# Patient Record
Sex: Female | Born: 1945 | Race: White | Hispanic: No | Marital: Married | Smoking: Current every day smoker
Health system: Southern US, Community
[De-identification: ages and names within clinical notes are randomized; demographics above are authoritative.]

## PROBLEM LIST (undated history)

## (undated) DIAGNOSIS — R51 Headache: Secondary | ICD-10-CM

## (undated) DIAGNOSIS — E039 Hypothyroidism, unspecified: Secondary | ICD-10-CM

## (undated) DIAGNOSIS — Q2112 Patent foramen ovale: Secondary | ICD-10-CM

## (undated) DIAGNOSIS — H53459 Other localized visual field defect, unspecified eye: Secondary | ICD-10-CM

## (undated) DIAGNOSIS — F329 Major depressive disorder, single episode, unspecified: Secondary | ICD-10-CM

## (undated) DIAGNOSIS — J302 Other seasonal allergic rhinitis: Secondary | ICD-10-CM

## (undated) DIAGNOSIS — Q211 Atrial septal defect: Secondary | ICD-10-CM

## (undated) DIAGNOSIS — I10 Essential (primary) hypertension: Secondary | ICD-10-CM

## (undated) DIAGNOSIS — I639 Cerebral infarction, unspecified: Secondary | ICD-10-CM

## (undated) DIAGNOSIS — R519 Headache, unspecified: Secondary | ICD-10-CM

## (undated) DIAGNOSIS — R200 Anesthesia of skin: Secondary | ICD-10-CM

## (undated) DIAGNOSIS — F419 Anxiety disorder, unspecified: Secondary | ICD-10-CM

## (undated) DIAGNOSIS — K219 Gastro-esophageal reflux disease without esophagitis: Secondary | ICD-10-CM

## (undated) DIAGNOSIS — D649 Anemia, unspecified: Secondary | ICD-10-CM

## (undated) DIAGNOSIS — E7800 Pure hypercholesterolemia, unspecified: Secondary | ICD-10-CM

## (undated) DIAGNOSIS — F32A Depression, unspecified: Secondary | ICD-10-CM

## (undated) DIAGNOSIS — E119 Type 2 diabetes mellitus without complications: Secondary | ICD-10-CM

## (undated) DIAGNOSIS — I499 Cardiac arrhythmia, unspecified: Secondary | ICD-10-CM

## (undated) HISTORY — PX: FOOT NEUROMA SURGERY: SHX646

## (undated) HISTORY — PX: NASAL SINUS SURGERY: SHX719

## (undated) HISTORY — PX: ABDOMINAL HYSTERECTOMY: SHX81

## (undated) HISTORY — PX: PATENT FORAMEN OVALE CLOSURE: SHX2181

## (undated) HISTORY — PX: CARDIAC CATHETERIZATION: SHX172

---

## 2006-10-09 ENCOUNTER — Ambulatory Visit: Payer: Self-pay | Admitting: Family Medicine

## 2007-09-21 ENCOUNTER — Inpatient Hospital Stay: Payer: Self-pay | Admitting: Internal Medicine

## 2007-09-25 ENCOUNTER — Ambulatory Visit: Payer: Self-pay | Admitting: Internal Medicine

## 2007-11-18 ENCOUNTER — Inpatient Hospital Stay: Payer: Self-pay | Admitting: Internal Medicine

## 2007-11-29 ENCOUNTER — Encounter: Payer: Self-pay | Admitting: Internal Medicine

## 2007-12-08 ENCOUNTER — Encounter: Payer: Self-pay | Admitting: Internal Medicine

## 2007-12-31 ENCOUNTER — Ambulatory Visit: Payer: Self-pay | Admitting: Internal Medicine

## 2008-02-10 ENCOUNTER — Ambulatory Visit: Payer: Self-pay | Admitting: Internal Medicine

## 2008-03-12 ENCOUNTER — Inpatient Hospital Stay: Payer: Self-pay | Admitting: Internal Medicine

## 2008-03-27 ENCOUNTER — Encounter (INDEPENDENT_AMBULATORY_CARE_PROVIDER_SITE_OTHER): Payer: Self-pay | Admitting: Neurology

## 2008-03-27 ENCOUNTER — Ambulatory Visit (HOSPITAL_COMMUNITY): Admission: RE | Admit: 2008-03-27 | Discharge: 2008-03-27 | Payer: Self-pay | Admitting: Cardiology

## 2009-02-01 ENCOUNTER — Ambulatory Visit: Payer: Self-pay | Admitting: Internal Medicine

## 2010-03-29 ENCOUNTER — Ambulatory Visit: Payer: Self-pay | Admitting: Internal Medicine

## 2010-07-12 ENCOUNTER — Ambulatory Visit: Payer: Self-pay | Admitting: Internal Medicine

## 2010-11-24 ENCOUNTER — Emergency Department: Payer: Self-pay | Admitting: Emergency Medicine

## 2010-12-21 ENCOUNTER — Ambulatory Visit: Payer: Self-pay | Admitting: Specialist

## 2010-12-23 LAB — PATHOLOGY REPORT

## 2011-04-21 ENCOUNTER — Ambulatory Visit: Payer: Self-pay | Admitting: Gastroenterology

## 2011-04-25 LAB — PATHOLOGY REPORT

## 2011-05-01 ENCOUNTER — Ambulatory Visit: Payer: Self-pay | Admitting: Internal Medicine

## 2011-05-16 ENCOUNTER — Ambulatory Visit: Payer: Self-pay | Admitting: Gastroenterology

## 2011-05-18 LAB — PATHOLOGY REPORT

## 2012-06-17 ENCOUNTER — Ambulatory Visit: Payer: Self-pay | Admitting: Internal Medicine

## 2013-06-18 ENCOUNTER — Ambulatory Visit: Payer: Self-pay | Admitting: Internal Medicine

## 2014-07-10 ENCOUNTER — Ambulatory Visit: Payer: Self-pay | Admitting: Internal Medicine

## 2014-11-13 DIAGNOSIS — E785 Hyperlipidemia, unspecified: Secondary | ICD-10-CM | POA: Diagnosis not present

## 2014-11-13 DIAGNOSIS — I739 Peripheral vascular disease, unspecified: Secondary | ICD-10-CM | POA: Diagnosis not present

## 2014-11-13 DIAGNOSIS — E079 Disorder of thyroid, unspecified: Secondary | ICD-10-CM | POA: Diagnosis not present

## 2014-11-13 DIAGNOSIS — Z0000 Encounter for general adult medical examination without abnormal findings: Secondary | ICD-10-CM | POA: Diagnosis not present

## 2014-11-13 DIAGNOSIS — I10 Essential (primary) hypertension: Secondary | ICD-10-CM | POA: Diagnosis not present

## 2014-11-19 DIAGNOSIS — E785 Hyperlipidemia, unspecified: Secondary | ICD-10-CM | POA: Diagnosis not present

## 2014-11-19 DIAGNOSIS — I10 Essential (primary) hypertension: Secondary | ICD-10-CM | POA: Diagnosis not present

## 2014-11-19 DIAGNOSIS — E119 Type 2 diabetes mellitus without complications: Secondary | ICD-10-CM | POA: Diagnosis not present

## 2014-11-19 DIAGNOSIS — I639 Cerebral infarction, unspecified: Secondary | ICD-10-CM | POA: Diagnosis not present

## 2014-12-10 DIAGNOSIS — G5761 Lesion of plantar nerve, right lower limb: Secondary | ICD-10-CM | POA: Diagnosis not present

## 2014-12-25 DIAGNOSIS — M21371 Foot drop, right foot: Secondary | ICD-10-CM | POA: Diagnosis not present

## 2015-02-10 DIAGNOSIS — I739 Peripheral vascular disease, unspecified: Secondary | ICD-10-CM | POA: Diagnosis not present

## 2015-02-10 DIAGNOSIS — I639 Cerebral infarction, unspecified: Secondary | ICD-10-CM | POA: Diagnosis not present

## 2015-02-10 DIAGNOSIS — E119 Type 2 diabetes mellitus without complications: Secondary | ICD-10-CM | POA: Diagnosis not present

## 2015-02-10 DIAGNOSIS — E785 Hyperlipidemia, unspecified: Secondary | ICD-10-CM | POA: Diagnosis not present

## 2015-02-10 DIAGNOSIS — I10 Essential (primary) hypertension: Secondary | ICD-10-CM | POA: Diagnosis not present

## 2015-02-17 DIAGNOSIS — E538 Deficiency of other specified B group vitamins: Secondary | ICD-10-CM | POA: Insufficient documentation

## 2015-02-17 DIAGNOSIS — I739 Peripheral vascular disease, unspecified: Secondary | ICD-10-CM | POA: Diagnosis not present

## 2015-02-17 DIAGNOSIS — I10 Essential (primary) hypertension: Secondary | ICD-10-CM | POA: Diagnosis not present

## 2015-02-17 DIAGNOSIS — K219 Gastro-esophageal reflux disease without esophagitis: Secondary | ICD-10-CM | POA: Diagnosis not present

## 2015-02-17 DIAGNOSIS — E784 Other hyperlipidemia: Secondary | ICD-10-CM | POA: Diagnosis not present

## 2015-03-12 DIAGNOSIS — H52223 Regular astigmatism, bilateral: Secondary | ICD-10-CM | POA: Diagnosis not present

## 2015-03-12 DIAGNOSIS — I10 Essential (primary) hypertension: Secondary | ICD-10-CM | POA: Diagnosis not present

## 2015-03-12 DIAGNOSIS — E119 Type 2 diabetes mellitus without complications: Secondary | ICD-10-CM | POA: Diagnosis not present

## 2015-03-12 DIAGNOSIS — H2513 Age-related nuclear cataract, bilateral: Secondary | ICD-10-CM | POA: Diagnosis not present

## 2015-03-12 DIAGNOSIS — H5203 Hypermetropia, bilateral: Secondary | ICD-10-CM | POA: Diagnosis not present

## 2015-05-18 DIAGNOSIS — I10 Essential (primary) hypertension: Secondary | ICD-10-CM | POA: Diagnosis not present

## 2015-05-18 DIAGNOSIS — E538 Deficiency of other specified B group vitamins: Secondary | ICD-10-CM | POA: Diagnosis not present

## 2015-05-18 DIAGNOSIS — E119 Type 2 diabetes mellitus without complications: Secondary | ICD-10-CM | POA: Diagnosis not present

## 2015-05-25 DIAGNOSIS — I10 Essential (primary) hypertension: Secondary | ICD-10-CM | POA: Diagnosis not present

## 2015-05-25 DIAGNOSIS — I739 Peripheral vascular disease, unspecified: Secondary | ICD-10-CM | POA: Diagnosis not present

## 2015-05-25 DIAGNOSIS — E079 Disorder of thyroid, unspecified: Secondary | ICD-10-CM | POA: Diagnosis not present

## 2015-05-25 DIAGNOSIS — E784 Other hyperlipidemia: Secondary | ICD-10-CM | POA: Diagnosis not present

## 2015-06-10 DIAGNOSIS — H2512 Age-related nuclear cataract, left eye: Secondary | ICD-10-CM | POA: Diagnosis not present

## 2015-06-10 DIAGNOSIS — H2511 Age-related nuclear cataract, right eye: Secondary | ICD-10-CM | POA: Diagnosis not present

## 2015-06-10 DIAGNOSIS — H5203 Hypermetropia, bilateral: Secondary | ICD-10-CM | POA: Diagnosis not present

## 2015-06-10 DIAGNOSIS — E119 Type 2 diabetes mellitus without complications: Secondary | ICD-10-CM | POA: Diagnosis not present

## 2015-06-10 DIAGNOSIS — I10 Essential (primary) hypertension: Secondary | ICD-10-CM | POA: Diagnosis not present

## 2015-06-10 DIAGNOSIS — H52223 Regular astigmatism, bilateral: Secondary | ICD-10-CM | POA: Diagnosis not present

## 2015-06-14 ENCOUNTER — Other Ambulatory Visit: Payer: Self-pay | Admitting: Internal Medicine

## 2015-06-14 DIAGNOSIS — Z1231 Encounter for screening mammogram for malignant neoplasm of breast: Secondary | ICD-10-CM

## 2015-07-13 ENCOUNTER — Ambulatory Visit
Admission: RE | Admit: 2015-07-13 | Discharge: 2015-07-13 | Disposition: A | Discharge status: Home / Self Care | Payer: Commercial Managed Care - HMO | Source: Ambulatory Visit | Attending: Internal Medicine | Admitting: Internal Medicine

## 2015-07-13 ENCOUNTER — Other Ambulatory Visit: Payer: Self-pay | Admitting: Internal Medicine

## 2015-07-13 ENCOUNTER — Telehealth: Payer: Self-pay | Admitting: *Deleted

## 2015-07-13 DIAGNOSIS — Z1231 Encounter for screening mammogram for malignant neoplasm of breast: Secondary | ICD-10-CM

## 2015-07-13 NOTE — Telephone Encounter (Signed)
After multiple voicemails was able to speak with patient and she requests that I call back to coordinate CT screening at the end of October.

## 2015-08-17 DIAGNOSIS — H2513 Age-related nuclear cataract, bilateral: Secondary | ICD-10-CM | POA: Diagnosis not present

## 2015-08-26 DIAGNOSIS — H2513 Age-related nuclear cataract, bilateral: Secondary | ICD-10-CM | POA: Diagnosis not present

## 2015-08-27 ENCOUNTER — Encounter: Payer: Self-pay | Admitting: *Deleted

## 2015-08-31 NOTE — Discharge Instructions (Signed)

## 2015-09-01 ENCOUNTER — Ambulatory Visit
Admission: RE | Admit: 2015-09-01 | Discharge: 2015-09-01 | Disposition: A | Discharge status: Home / Self Care | Payer: Commercial Managed Care - HMO | Source: Ambulatory Visit | Attending: Ophthalmology | Admitting: Ophthalmology

## 2015-09-01 ENCOUNTER — Ambulatory Visit: Payer: Commercial Managed Care - HMO | Admitting: Anesthesiology

## 2015-09-01 ENCOUNTER — Encounter: Payer: Self-pay | Admitting: *Deleted

## 2015-09-01 ENCOUNTER — Encounter
Admission: RE | Disposition: A | Discharge status: Home / Self Care | Payer: Self-pay | Source: Ambulatory Visit | Attending: Ophthalmology

## 2015-09-01 DIAGNOSIS — Z9889 Other specified postprocedural states: Secondary | ICD-10-CM | POA: Insufficient documentation

## 2015-09-01 DIAGNOSIS — E079 Disorder of thyroid, unspecified: Secondary | ICD-10-CM | POA: Insufficient documentation

## 2015-09-01 DIAGNOSIS — Z8673 Personal history of transient ischemic attack (TIA), and cerebral infarction without residual deficits: Secondary | ICD-10-CM | POA: Insufficient documentation

## 2015-09-01 DIAGNOSIS — H2513 Age-related nuclear cataract, bilateral: Secondary | ICD-10-CM | POA: Diagnosis not present

## 2015-09-01 DIAGNOSIS — Z880 Allergy status to penicillin: Secondary | ICD-10-CM | POA: Diagnosis not present

## 2015-09-01 DIAGNOSIS — E7800 Pure hypercholesterolemia, unspecified: Secondary | ICD-10-CM | POA: Insufficient documentation

## 2015-09-01 DIAGNOSIS — Z7951 Long term (current) use of inhaled steroids: Secondary | ICD-10-CM | POA: Diagnosis not present

## 2015-09-01 DIAGNOSIS — E119 Type 2 diabetes mellitus without complications: Secondary | ICD-10-CM | POA: Diagnosis not present

## 2015-09-01 DIAGNOSIS — K219 Gastro-esophageal reflux disease without esophagitis: Secondary | ICD-10-CM | POA: Insufficient documentation

## 2015-09-01 DIAGNOSIS — F17200 Nicotine dependence, unspecified, uncomplicated: Secondary | ICD-10-CM | POA: Diagnosis not present

## 2015-09-01 DIAGNOSIS — Z79899 Other long term (current) drug therapy: Secondary | ICD-10-CM | POA: Insufficient documentation

## 2015-09-01 DIAGNOSIS — H2511 Age-related nuclear cataract, right eye: Secondary | ICD-10-CM | POA: Insufficient documentation

## 2015-09-01 DIAGNOSIS — Z90710 Acquired absence of both cervix and uterus: Secondary | ICD-10-CM | POA: Diagnosis not present

## 2015-09-01 DIAGNOSIS — H269 Unspecified cataract: Secondary | ICD-10-CM | POA: Diagnosis present

## 2015-09-01 HISTORY — DX: Anxiety disorder, unspecified: F41.9

## 2015-09-01 HISTORY — DX: Headache: R51

## 2015-09-01 HISTORY — DX: Anemia, unspecified: D64.9

## 2015-09-01 HISTORY — DX: Hypothyroidism, unspecified: E03.9

## 2015-09-01 HISTORY — DX: Major depressive disorder, single episode, unspecified: F32.9

## 2015-09-01 HISTORY — DX: Gastro-esophageal reflux disease without esophagitis: K21.9

## 2015-09-01 HISTORY — DX: Atrial septal defect: Q21.1

## 2015-09-01 HISTORY — DX: Other localized visual field defect, unspecified eye: H53.459

## 2015-09-01 HISTORY — DX: Cerebral infarction, unspecified: I63.9

## 2015-09-01 HISTORY — DX: Headache, unspecified: R51.9

## 2015-09-01 HISTORY — DX: Type 2 diabetes mellitus without complications: E11.9

## 2015-09-01 HISTORY — DX: Pure hypercholesterolemia, unspecified: E78.00

## 2015-09-01 HISTORY — DX: Cardiac arrhythmia, unspecified: I49.9

## 2015-09-01 HISTORY — DX: Other seasonal allergic rhinitis: J30.2

## 2015-09-01 HISTORY — DX: Essential (primary) hypertension: I10

## 2015-09-01 HISTORY — DX: Patent foramen ovale: Q21.12

## 2015-09-01 HISTORY — DX: Depression, unspecified: F32.A

## 2015-09-01 HISTORY — PX: CATARACT EXTRACTION W/PHACO: SHX586

## 2015-09-01 HISTORY — DX: Anesthesia of skin: R20.0

## 2015-09-01 LAB — GLUCOSE, CAPILLARY
GLUCOSE-CAPILLARY: 164 mg/dL — AB (ref 65–99)
Glucose-Capillary: 140 mg/dL — ABNORMAL HIGH (ref 65–99)

## 2015-09-01 SURGERY — PHACOEMULSIFICATION, CATARACT, WITH IOL INSERTION
Anesthesia: Monitor Anesthesia Care | Laterality: Right | Wound class: Clean

## 2015-09-01 MED ORDER — ARMC OPHTHALMIC DILATING GEL
1.0000 "application " | OPHTHALMIC | Status: DC | PRN
  Administered 2015-09-01 (×2): 1 via OPHTHALMIC

## 2015-09-01 MED ORDER — LACTATED RINGERS IV SOLN: INTRAVENOUS | Status: DC

## 2015-09-01 MED ORDER — MIDAZOLAM HCL 2 MG/2ML IJ SOLN
INTRAMUSCULAR | Status: DC | PRN
Start: 2015-09-01 — End: 2015-09-01
  Administered 2015-09-01: 2 mg via INTRAVENOUS

## 2015-09-01 MED ORDER — ACETAMINOPHEN 325 MG PO TABS: 325.0000 mg | ORAL_TABLET | ORAL | Status: DC | PRN

## 2015-09-01 MED ORDER — TETRACAINE HCL 0.5 % OP SOLN
1.0000 [drp] | OPHTHALMIC | Status: DC | PRN
  Administered 2015-09-01: 1 [drp] via OPHTHALMIC

## 2015-09-01 MED ORDER — FENTANYL CITRATE (PF) 100 MCG/2ML IJ SOLN
INTRAMUSCULAR | Status: DC | PRN
  Administered 2015-09-01: 50 ug via INTRAVENOUS

## 2015-09-01 MED ORDER — BRIMONIDINE TARTRATE 0.2 % OP SOLN
OPHTHALMIC | Status: DC | PRN
  Administered 2015-09-01: 1 [drp]

## 2015-09-01 MED ORDER — NA HYALUR & NA CHOND-NA HYALUR 0.4-0.35 ML IO KIT
PACK | INTRAOCULAR | Status: DC | PRN
  Administered 2015-09-01: 1 mL via INTRAOCULAR

## 2015-09-01 MED ORDER — ACETAMINOPHEN 160 MG/5ML PO SOLN: 325.0000 mg | ORAL | Status: DC | PRN

## 2015-09-01 MED ORDER — TIMOLOL MALEATE 0.5 % OP SOLN
OPHTHALMIC | Status: DC | PRN
  Administered 2015-09-01: 1 [drp]

## 2015-09-01 MED ORDER — CEFUROXIME OPHTHALMIC INJECTION 1 MG/0.1 ML
INJECTION | OPHTHALMIC | Status: DC | PRN
  Administered 2015-09-01: 0.1 mL via INTRACAMERAL

## 2015-09-01 MED ORDER — POVIDONE-IODINE 5 % OP SOLN
1.0000 "application " | OPHTHALMIC | Status: DC | PRN
  Administered 2015-09-01: 1 via OPHTHALMIC

## 2015-09-01 MED ORDER — EPINEPHRINE HCL 1 MG/ML IJ SOLN
INTRAMUSCULAR | Status: DC | PRN
  Administered 2015-09-01: 92 mL via OPHTHALMIC
  Administered 2015-09-01: 09:00:00 via OPHTHALMIC

## 2015-09-01 SURGICAL SUPPLY — 26 items
CANNULA ANT/CHMB 27GA (MISCELLANEOUS) ×3 IMPLANT
GLOVE SURG LX 7.5 STRW (GLOVE) ×2
GLOVE SURG LX STRL 7.5 STRW (GLOVE) ×1 IMPLANT
GLOVE SURG TRIUMPH 8.0 PF LTX (GLOVE) ×3 IMPLANT
GOWN STRL REUS W/ TWL LRG LVL3 (GOWN DISPOSABLE) ×2 IMPLANT
GOWN STRL REUS W/TWL LRG LVL3 (GOWN DISPOSABLE) ×4
LENS IOL TECNIS 23.5 (Intraocular Lens) ×3 IMPLANT
LENS IOL TECNIS MONO 1P 23.5 (Intraocular Lens) ×1 IMPLANT
MARKER SKIN SURG W/RULER VIO (MISCELLANEOUS) ×3 IMPLANT
NDL RETROBULBAR .5 NSTRL (NEEDLE) IMPLANT
NEEDLE FILTER BLUNT 18X 1/2SAF (NEEDLE) ×4
NEEDLE FILTER BLUNT 18X1 1/2 (NEEDLE) ×2 IMPLANT
PACK CATARACT BRASINGTON (MISCELLANEOUS) ×3 IMPLANT
PACK EYE AFTER SURG (MISCELLANEOUS) ×3 IMPLANT
PACK OPTHALMIC (MISCELLANEOUS) ×3 IMPLANT
RING MALYGIN 7.0 (MISCELLANEOUS) IMPLANT
SUT ETHILON 10-0 CS-B-6CS-B-6 (SUTURE)
SUT VICRYL  9 0 (SUTURE)
SUT VICRYL 9 0 (SUTURE) IMPLANT
SUTURE EHLN 10-0 CS-B-6CS-B-6 (SUTURE) IMPLANT
SYR 3ML LL SCALE MARK (SYRINGE) ×6 IMPLANT
SYR 5ML LL (SYRINGE) IMPLANT
SYR TB 1ML LUER SLIP (SYRINGE) ×3 IMPLANT
WATER STERILE IRR 250ML POUR (IV SOLUTION) ×3 IMPLANT
WATER STERILE IRR 500ML POUR (IV SOLUTION) IMPLANT
WIPE NON LINTING 3.25X3.25 (MISCELLANEOUS) ×3 IMPLANT

## 2015-09-01 NOTE — Anesthesia Preprocedure Evaluation (Signed)
Anesthesia Evaluation  Patient identified by MRN, date of birth, ID band  Reviewed: Allergy & Precautions, H&P , NPO status , Patient's Chart, lab work & pertinent test results  Airway Mallampati: II  TM Distance: >3 FB Neck ROM: full    Dental no notable dental hx.    Pulmonary Current Smoker,    Pulmonary exam normal        Cardiovascular hypertension,  Rhythm:regular Rate:Normal     Neuro/Psych    GI/Hepatic GERD  ,  Endo/Other  diabetesHypothyroidism   Renal/GU      Musculoskeletal   Abdominal   Peds  Hematology   Anesthesia Other Findings   Reproductive/Obstetrics                             Anesthesia Physical Anesthesia Plan  ASA: II  Anesthesia Plan: MAC   Post-op Pain Management:    Induction:   Airway Management Planned:   Additional Equipment:   Intra-op Plan:   Post-operative Plan:   Informed Consent: I have reviewed the patients History and Physical, chart, labs and discussed the procedure including the risks, benefits and alternatives for the proposed anesthesia with the patient or authorized representative who has indicated his/her understanding and acceptance.     Plan Discussed with: CRNA  Anesthesia Plan Comments:         Anesthesia Quick Evaluation

## 2015-09-01 NOTE — Transfer of Care (Signed)
Immediate Anesthesia Transfer of Care Note  Patient: Erin Yates  Procedure(s) Performed: Procedure(s) with comments: CATARACT EXTRACTION PHACO AND INTRAOCULAR LENS PLACEMENT (IOC) (Right) - DIABETIC - oral meds  Patient Location: PACU  Anesthesia Type: MAC  Level of Consciousness: awake, alert  and patient cooperative  Airway and Oxygen Therapy: Patient Spontanous Breathing and Patient connected to supplemental oxygen  Post-op Assessment: Post-op Vital signs reviewed, Patient's Cardiovascular Status Stable, Respiratory Function Stable, Patent Airway and No signs of Nausea or vomiting  Post-op Vital Signs: Reviewed and stable  Complications: No apparent anesthesia complications

## 2015-09-01 NOTE — Op Note (Signed)
LOCATION:  [facility]   PREOPERATIVE DIAGNOSIS:    Nuclear sclerotic cataract right eye. H25.11   POSTOPERATIVE DIAGNOSIS:  Nuclear sclerotic cataract right eye.     PROCEDURE:  Phacoemusification with posterior chamber intraocular lens placement of the right eye   LENS:   Implant Name Type Inv. Item Serial No. Manufacturer Lot No. LRB No. Used  LENS IMPL INTRAOC ZCB00 23.5 - [id number] Intraocular Lens LENS IMPL INTRAOC ZCB00 23.5 [id number]   Right 1        ULTRASOUND TIME: 18 % of 1 minutes, 44 seconds.  CDE 19.1   SURGEON:  Wyonia Hough, MD   ANESTHESIA:  Topical with tetracaine drops and 2% Xylocaine jelly.   COMPLICATIONS:  None.   DESCRIPTION OF PROCEDURE:  The patient was identified in the holding room and transported to the operating room and placed in the supine position under the operating microscope.  The right eye was identified as the operative eye and it was prepped and draped in the usual sterile ophthalmic fashion.   A 1 millimeter clear-corneal paracentesis was made at the 12:00 position.  The anterior chamber was filled with Viscoat viscoelastic.  A 2.4 millimeter keratome was used to make a near-clear corneal incision at the 9:00 position.  A curvilinear capsulorrhexis was made with a cystotome and capsulorrhexis forceps.  Balanced salt solution was used to hydrodissect and hydrodelineate the nucleus.   Phacoemulsification was then used in stop and chop fashion to remove the lens nucleus and epinucleus.  The remaining cortex was then removed using the irrigation and aspiration handpiece. Provisc was then placed into the capsular bag to distend it for lens placement.  A lens was then injected into the capsular bag.  The remaining viscoelastic was aspirated.   Wounds were hydrated with balanced salt solution.  The anterior chamber was inflated to a physiologic pressure with balanced salt solution.  No wound leaks were noted. Cefuroxime 0.1 ml of  a 10mg /ml solution was injected into the anterior chamber for a dose of 1 mg of intracameral antibiotic at the completion of the case.   Timolol and Brimonidine drops were applied to the eye.  The patient was taken to the recovery room in stable condition without complications of anesthesia or surgery.   Silvia Markuson 09/01/2015, 9:11 AM

## 2015-09-01 NOTE — H&P (Signed)
  The History and Physical notes are on paper, have been signed, and are to be scanned. The patient remains stable and unchanged from the H&P.   Previous H&P reviewed, patient examined, and there are no changes.  Erin Yates 09/01/2015 8:40 AM

## 2015-09-01 NOTE — Anesthesia Procedure Notes (Signed)
Procedure Name: MAC Performed by: Anthon Harpole Pre-anesthesia Checklist: Patient identified, Emergency Drugs available, Suction available, Timeout performed and Patient being monitored Patient Re-evaluated:Patient Re-evaluated prior to inductionOxygen Delivery Method: Nasal cannula Placement Confirmation: positive ETCO2     

## 2015-09-01 NOTE — Anesthesia Postprocedure Evaluation (Signed)
  Anesthesia Post-op Note  Patient: Erin Yates  Procedure(s) Performed: Procedure(s) with comments: CATARACT EXTRACTION PHACO AND INTRAOCULAR LENS PLACEMENT (IOC) (Right) - DIABETIC - oral meds  Anesthesia type:MAC  Patient location: PACU  Post pain: Pain level controlled  Post assessment: Post-op Vital signs reviewed, Patient's Cardiovascular Status Stable, Respiratory Function Stable, Patent Airway and No signs of Nausea or vomiting  Post vital signs: Reviewed and stable  Last Vitals:  Filed Vitals:   09/01/15 0915  BP: 130/57  Pulse: 48  Temp:   Resp: 12    Level of consciousness: awake, alert  and patient cooperative  Complications: No apparent anesthesia complications

## 2015-09-02 ENCOUNTER — Encounter: Payer: Self-pay | Admitting: Ophthalmology

## 2015-10-13 DIAGNOSIS — H2512 Age-related nuclear cataract, left eye: Secondary | ICD-10-CM | POA: Diagnosis not present

## 2015-10-14 ENCOUNTER — Encounter: Payer: Self-pay | Admitting: *Deleted

## 2015-10-19 NOTE — Discharge Instructions (Signed)

## 2015-10-20 ENCOUNTER — Encounter
Admission: RE | Disposition: A | Discharge status: Home / Self Care | Payer: Self-pay | Source: Ambulatory Visit | Attending: Ophthalmology

## 2015-10-20 ENCOUNTER — Ambulatory Visit
Admission: RE | Admit: 2015-10-20 | Discharge: 2015-10-20 | Disposition: A | Discharge status: Home / Self Care | Payer: Commercial Managed Care - HMO | Source: Ambulatory Visit | Attending: Ophthalmology | Admitting: Ophthalmology

## 2015-10-20 ENCOUNTER — Ambulatory Visit: Payer: Commercial Managed Care - HMO | Admitting: Anesthesiology

## 2015-10-20 DIAGNOSIS — Z9889 Other specified postprocedural states: Secondary | ICD-10-CM | POA: Diagnosis not present

## 2015-10-20 DIAGNOSIS — E119 Type 2 diabetes mellitus without complications: Secondary | ICD-10-CM | POA: Diagnosis not present

## 2015-10-20 DIAGNOSIS — F329 Major depressive disorder, single episode, unspecified: Secondary | ICD-10-CM | POA: Diagnosis not present

## 2015-10-20 DIAGNOSIS — Z90710 Acquired absence of both cervix and uterus: Secondary | ICD-10-CM | POA: Diagnosis not present

## 2015-10-20 DIAGNOSIS — H269 Unspecified cataract: Secondary | ICD-10-CM | POA: Diagnosis present

## 2015-10-20 DIAGNOSIS — Z79899 Other long term (current) drug therapy: Secondary | ICD-10-CM | POA: Insufficient documentation

## 2015-10-20 DIAGNOSIS — Z8673 Personal history of transient ischemic attack (TIA), and cerebral infarction without residual deficits: Secondary | ICD-10-CM | POA: Diagnosis not present

## 2015-10-20 DIAGNOSIS — H2512 Age-related nuclear cataract, left eye: Secondary | ICD-10-CM | POA: Diagnosis not present

## 2015-10-20 DIAGNOSIS — E7800 Pure hypercholesterolemia, unspecified: Secondary | ICD-10-CM | POA: Diagnosis not present

## 2015-10-20 DIAGNOSIS — F17200 Nicotine dependence, unspecified, uncomplicated: Secondary | ICD-10-CM | POA: Insufficient documentation

## 2015-10-20 DIAGNOSIS — I10 Essential (primary) hypertension: Secondary | ICD-10-CM | POA: Insufficient documentation

## 2015-10-20 DIAGNOSIS — E079 Disorder of thyroid, unspecified: Secondary | ICD-10-CM | POA: Insufficient documentation

## 2015-10-20 DIAGNOSIS — Z9849 Cataract extraction status, unspecified eye: Secondary | ICD-10-CM | POA: Diagnosis not present

## 2015-10-20 DIAGNOSIS — Z7951 Long term (current) use of inhaled steroids: Secondary | ICD-10-CM | POA: Insufficient documentation

## 2015-10-20 HISTORY — PX: CATARACT EXTRACTION W/PHACO: SHX586

## 2015-10-20 LAB — GLUCOSE, CAPILLARY
GLUCOSE-CAPILLARY: 156 mg/dL — AB (ref 65–99)
Glucose-Capillary: 148 mg/dL — ABNORMAL HIGH (ref 65–99)

## 2015-10-20 SURGERY — PHACOEMULSIFICATION, CATARACT, WITH IOL INSERTION
Anesthesia: Monitor Anesthesia Care | Laterality: Left | Wound class: Clean

## 2015-10-20 MED ORDER — POVIDONE-IODINE 5 % OP SOLN
1.0000 "application " | OPHTHALMIC | Status: DC | PRN
  Administered 2015-10-20: 1 via OPHTHALMIC

## 2015-10-20 MED ORDER — FENTANYL CITRATE (PF) 100 MCG/2ML IJ SOLN
INTRAMUSCULAR | Status: DC | PRN
  Administered 2015-10-20: 50 ug via INTRAVENOUS

## 2015-10-20 MED ORDER — NA HYALUR & NA CHOND-NA HYALUR 0.4-0.35 ML IO KIT
PACK | INTRAOCULAR | Status: DC | PRN
  Administered 2015-10-20: 1 mL via INTRAOCULAR

## 2015-10-20 MED ORDER — ARMC OPHTHALMIC DILATING GEL
1.0000 "application " | OPHTHALMIC | Status: DC | PRN
  Administered 2015-10-20 (×2): 1 via OPHTHALMIC

## 2015-10-20 MED ORDER — TIMOLOL MALEATE 0.5 % OP SOLN
OPHTHALMIC | Status: DC | PRN
  Administered 2015-10-20: 1 [drp] via OPHTHALMIC

## 2015-10-20 MED ORDER — CEFUROXIME OPHTHALMIC INJECTION 1 MG/0.1 ML
INJECTION | OPHTHALMIC | Status: DC | PRN
  Administered 2015-10-20: 0.1 mL via INTRACAMERAL

## 2015-10-20 MED ORDER — MIDAZOLAM HCL 2 MG/2ML IJ SOLN
INTRAMUSCULAR | Status: DC | PRN
  Administered 2015-10-20: 2 mg via INTRAVENOUS

## 2015-10-20 MED ORDER — EPINEPHRINE HCL 1 MG/ML IJ SOLN
INTRAOCULAR | Status: DC | PRN
  Administered 2015-10-20: 70 mL via OPHTHALMIC

## 2015-10-20 MED ORDER — TETRACAINE HCL 0.5 % OP SOLN
1.0000 [drp] | OPHTHALMIC | Status: DC | PRN
  Administered 2015-10-20: 1 [drp] via OPHTHALMIC

## 2015-10-20 MED ORDER — BRIMONIDINE TARTRATE 0.2 % OP SOLN
OPHTHALMIC | Status: DC | PRN
  Administered 2015-10-20: 1 [drp] via OPHTHALMIC

## 2015-10-20 SURGICAL SUPPLY — 27 items
CANNULA ANT/CHMB 27GA (MISCELLANEOUS) ×3 IMPLANT
CARTRIDGE ABBOTT (MISCELLANEOUS) ×3 IMPLANT
GLOVE SURG LX 7.5 STRW (GLOVE) ×2
GLOVE SURG LX STRL 7.5 STRW (GLOVE) ×1 IMPLANT
GLOVE SURG TRIUMPH 8.0 PF LTX (GLOVE) ×3 IMPLANT
GOWN STRL REUS W/ TWL LRG LVL3 (GOWN DISPOSABLE) ×2 IMPLANT
GOWN STRL REUS W/TWL LRG LVL3 (GOWN DISPOSABLE) ×4
LENS IOL TECNIS 23.5 (Intraocular Lens) ×3 IMPLANT
LENS IOL TECNIS MONO 1P 23.5 (Intraocular Lens) ×1 IMPLANT
MARKER SKIN SURG W/RULER VIO (MISCELLANEOUS) ×3 IMPLANT
NDL RETROBULBAR .5 NSTRL (NEEDLE) IMPLANT
NEEDLE FILTER BLUNT 18X 1/2SAF (NEEDLE) ×2
NEEDLE FILTER BLUNT 18X1 1/2 (NEEDLE) ×1 IMPLANT
PACK CATARACT BRASINGTON (MISCELLANEOUS) ×3 IMPLANT
PACK EYE AFTER SURG (MISCELLANEOUS) ×3 IMPLANT
PACK OPTHALMIC (MISCELLANEOUS) ×3 IMPLANT
RING MALYGIN 7.0 (MISCELLANEOUS) IMPLANT
SUT ETHILON 10-0 CS-B-6CS-B-6 (SUTURE)
SUT VICRYL  9 0 (SUTURE)
SUT VICRYL 9 0 (SUTURE) IMPLANT
SUTURE EHLN 10-0 CS-B-6CS-B-6 (SUTURE) IMPLANT
SYR 3ML LL SCALE MARK (SYRINGE) ×3 IMPLANT
SYR 5ML LL (SYRINGE) IMPLANT
SYR TB 1ML LUER SLIP (SYRINGE) ×3 IMPLANT
WATER STERILE IRR 250ML POUR (IV SOLUTION) ×3 IMPLANT
WATER STERILE IRR 500ML POUR (IV SOLUTION) IMPLANT
WIPE NON LINTING 3.25X3.25 (MISCELLANEOUS) ×3 IMPLANT

## 2015-10-20 NOTE — Anesthesia Procedure Notes (Signed)
Procedure Name: MAC Performed by: Mana Morison Pre-anesthesia Checklist: Patient identified, Emergency Drugs available, Suction available, Timeout performed and Patient being monitored Patient Re-evaluated:Patient Re-evaluated prior to inductionOxygen Delivery Method: Nasal cannula Placement Confirmation: positive ETCO2     

## 2015-10-20 NOTE — Anesthesia Postprocedure Evaluation (Signed)
Anesthesia Post Note  Patient: Erin Yates  Procedure(s) Performed: Procedure(s) (LRB): CATARACT EXTRACTION PHACO AND INTRAOCULAR LENS PLACEMENT (IOC) (Left)  Patient location during evaluation: PACU Level of consciousness: awake Pain management: pain level controlled Vital Signs Assessment: post-procedure vital signs reviewed and stable Respiratory status: spontaneous breathing Cardiovascular status: blood pressure returned to baseline Postop Assessment: no signs of nausea or vomiting and adequate PO intake    Estill Batten

## 2015-10-20 NOTE — Anesthesia Preprocedure Evaluation (Signed)
Anesthesia Evaluation  Patient identified by MRN, date of birth, ID band  Reviewed: Allergy & Precautions, H&P , NPO status , Patient's Chart, lab work & pertinent test results  Airway Mallampati: II  TM Distance: >3 FB Neck ROM: full    Dental no notable dental hx.    Pulmonary Current Smoker,    Pulmonary exam normal        Cardiovascular hypertension,  Rhythm:regular Rate:Normal     Neuro/Psych    GI/Hepatic GERD  ,  Endo/Other  diabetesHypothyroidism   Renal/GU      Musculoskeletal   Abdominal   Peds  Hematology   Anesthesia Other Findings   Reproductive/Obstetrics                             Anesthesia Physical  Anesthesia Plan  ASA: II  Anesthesia Plan: MAC   Post-op Pain Management:    Induction:   Airway Management Planned:   Additional Equipment:   Intra-op Plan:   Post-operative Plan:   Informed Consent: I have reviewed the patients History and Physical, chart, labs and discussed the procedure including the risks, benefits and alternatives for the proposed anesthesia with the patient or authorized representative who has indicated his/her understanding and acceptance.     Plan Discussed with: CRNA  Anesthesia Plan Comments:         Anesthesia Quick Evaluation

## 2015-10-20 NOTE — Transfer of Care (Signed)
Immediate Anesthesia Transfer of Care Note  Patient: Erin Yates  Procedure(s) Performed: Procedure(s) with comments: CATARACT EXTRACTION PHACO AND INTRAOCULAR LENS PLACEMENT (IOC) (Left) - DIABETIC - oral meds  Patient Location: PACU  Anesthesia Type: MAC  Level of Consciousness: awake, alert  and patient cooperative  Airway and Oxygen Therapy: Patient Spontanous Breathing and Patient connected to supplemental oxygen  Post-op Assessment: Post-op Vital signs reviewed, Patient's Cardiovascular Status Stable, Respiratory Function Stable, Patent Airway and No signs of Nausea or vomiting  Post-op Vital Signs: Reviewed and stable  Complications: No apparent anesthesia complications

## 2015-10-20 NOTE — H&P (Signed)
  The History and Physical notes are on paper, have been signed, and are to be scanned. The patient remains stable and unchanged from the H&P.   Previous H&P reviewed, patient examined, and there are no changes.  Erin Yates 10/20/2015 7:35 AM

## 2015-10-20 NOTE — Op Note (Signed)
OPERATIVE NOTE  Erin Yates [id number] 10/20/2015   PREOPERATIVE DIAGNOSIS:  Nuclear sclerotic cataract left eye. H25.12   POSTOPERATIVE DIAGNOSIS:    Nuclear sclerotic cataract left eye.     PROCEDURE:  Phacoemusification with posterior chamber intraocular lens placement of the left eye   LENS:   Implant Name Type Inv. Item Serial No. Manufacturer Lot No. LRB No. Used  LENS IMPL INTRAOC ZCB00 23.5 - [id number] Intraocular Lens LENS IMPL INTRAOC ZCB00 23.5 [id number]   Left 1        ULTRASOUND TIME: 10.6  % of 1 minutes 8 seconds, CDE 7.2  SURGEON:  Wyonia Hough, MD   ANESTHESIA:  Topical with tetracaine drops and 2% Xylocaine jelly.   COMPLICATIONS:  None.   DESCRIPTION OF PROCEDURE:  The patient was identified in the holding room and transported to the operating room and placed in the supine position under the operating microscope.  The left eye was identified as the operative eye and it was prepped and draped in the usual sterile ophthalmic fashion.   A 1 millimeter clear-corneal paracentesis was made at the 1:30 position.  The anterior chamber was filled with Viscoat viscoelastic.  A 2.4 millimeter keratome was used to make a near-clear corneal incision at the 10:30 position.  .  A curvilinear capsulorrhexis was made with a cystotome and capsulorrhexis forceps.  Balanced salt solution was used to hydrodissect and hydrodelineate the nucleus.   Phacoemulsification was then used in stop and chop fashion to remove the lens nucleus and epinucleus.  The remaining cortex was then removed using the irrigation and aspiration handpiece. Provisc was then placed into the capsular bag to distend it for lens placement.  A lens was then injected into the capsular bag.  The remaining viscoelastic was aspirated.   Wounds were hydrated with balanced salt solution.  The anterior chamber was inflated to a physiologic pressure with balanced salt solution.  No wound leaks were noted.  Cefuroxime 0.1 ml of a 10mg /ml solution was injected into the anterior chamber for a dose of 1 mg of intracameral antibiotic at the completion of the case.   Timolol and Brimonidine drops were applied to the eye.  The patient was taken to the recovery room in stable condition without complications of anesthesia or surgery.  [name] 10/20/2015, 8:08 AM

## 2015-10-21 ENCOUNTER — Encounter: Payer: Self-pay | Admitting: Ophthalmology

## 2015-11-09 ENCOUNTER — Telehealth: Payer: Self-pay | Admitting: *Deleted

## 2015-11-09 NOTE — Telephone Encounter (Signed)
Received referral for low dose lung cancer screening CT scan from Dr. [name] . Voicemail left at phone number listed in EMR for patient to call me back to facilitate scheduling scan. Previously have contacted patient and she requested to wait until January 2017.

## 2015-11-18 DIAGNOSIS — E119 Type 2 diabetes mellitus without complications: Secondary | ICD-10-CM | POA: Diagnosis not present

## 2015-11-18 DIAGNOSIS — E538 Deficiency of other specified B group vitamins: Secondary | ICD-10-CM | POA: Diagnosis not present

## 2015-11-18 DIAGNOSIS — I10 Essential (primary) hypertension: Secondary | ICD-10-CM | POA: Diagnosis not present

## 2015-11-25 DIAGNOSIS — D519 Vitamin B12 deficiency anemia, unspecified: Secondary | ICD-10-CM | POA: Diagnosis not present

## 2015-11-25 DIAGNOSIS — F3342 Major depressive disorder, recurrent, in full remission: Secondary | ICD-10-CM | POA: Insufficient documentation

## 2015-11-25 DIAGNOSIS — Q211 Atrial septal defect: Secondary | ICD-10-CM | POA: Diagnosis not present

## 2015-11-25 DIAGNOSIS — E784 Other hyperlipidemia: Secondary | ICD-10-CM | POA: Diagnosis not present

## 2015-11-25 DIAGNOSIS — I38 Endocarditis, valve unspecified: Secondary | ICD-10-CM | POA: Diagnosis not present

## 2015-11-25 DIAGNOSIS — I10 Essential (primary) hypertension: Secondary | ICD-10-CM | POA: Diagnosis not present

## 2015-11-25 DIAGNOSIS — F418 Other specified anxiety disorders: Secondary | ICD-10-CM | POA: Diagnosis not present

## 2015-11-25 DIAGNOSIS — E1129 Type 2 diabetes mellitus with other diabetic kidney complication: Secondary | ICD-10-CM | POA: Diagnosis not present

## 2015-11-25 DIAGNOSIS — I739 Peripheral vascular disease, unspecified: Secondary | ICD-10-CM | POA: Diagnosis not present

## 2015-11-29 ENCOUNTER — Telehealth: Payer: Self-pay | Admitting: *Deleted

## 2015-11-29 DIAGNOSIS — Z961 Presence of intraocular lens: Secondary | ICD-10-CM | POA: Diagnosis not present

## 2015-11-29 NOTE — Telephone Encounter (Signed)
Received referral for low dose lung cancer screening CT scan from [place] . Voicemail left at phone number listed in EMR for patient to call me back to facilitate scheduling scan.

## 2016-02-07 ENCOUNTER — Emergency Department: Payer: Commercial Managed Care - HMO

## 2016-02-07 ENCOUNTER — Inpatient Hospital Stay (HOSPITAL_COMMUNITY): Payer: Commercial Managed Care - HMO

## 2016-02-07 ENCOUNTER — Emergency Department
Admission: EM | Admit: 2016-02-07 | Discharge: 2016-02-07 | Disposition: A | Discharge status: Other Acute Inpatient Hospital | Payer: Commercial Managed Care - HMO | Attending: Internal Medicine | Admitting: Internal Medicine

## 2016-02-07 ENCOUNTER — Encounter: Payer: Self-pay | Admitting: Emergency Medicine

## 2016-02-07 ENCOUNTER — Inpatient Hospital Stay (HOSPITAL_COMMUNITY)
Admission: AD | Admit: 2016-02-07 | Discharge: 2016-02-09 | DRG: 065 | Disposition: A | Discharge status: Home / Self Care | Payer: Commercial Managed Care - HMO | Source: Other Acute Inpatient Hospital | Attending: Neurology | Admitting: Neurology

## 2016-02-07 DIAGNOSIS — Z79899 Other long term (current) drug therapy: Secondary | ICD-10-CM

## 2016-02-07 DIAGNOSIS — G4700 Insomnia, unspecified: Secondary | ICD-10-CM | POA: Diagnosis not present

## 2016-02-07 DIAGNOSIS — F17210 Nicotine dependence, cigarettes, uncomplicated: Secondary | ICD-10-CM | POA: Diagnosis not present

## 2016-02-07 DIAGNOSIS — I6340 Cerebral infarction due to embolism of unspecified cerebral artery: Principal | ICD-10-CM | POA: Diagnosis present

## 2016-02-07 DIAGNOSIS — H53462 Homonymous bilateral field defects, left side: Secondary | ICD-10-CM | POA: Diagnosis not present

## 2016-02-07 DIAGNOSIS — E039 Hypothyroidism, unspecified: Secondary | ICD-10-CM | POA: Diagnosis not present

## 2016-02-07 DIAGNOSIS — I639 Cerebral infarction, unspecified: Secondary | ICD-10-CM | POA: Insufficient documentation

## 2016-02-07 DIAGNOSIS — Z9282 Status post administration of tPA (rtPA) in a different facility within the last 24 hours prior to admission to current facility: Secondary | ICD-10-CM

## 2016-02-07 DIAGNOSIS — Z7984 Long term (current) use of oral hypoglycemic drugs: Secondary | ICD-10-CM

## 2016-02-07 DIAGNOSIS — Z8673 Personal history of transient ischemic attack (TIA), and cerebral infarction without residual deficits: Secondary | ICD-10-CM

## 2016-02-07 DIAGNOSIS — F329 Major depressive disorder, single episode, unspecified: Secondary | ICD-10-CM | POA: Diagnosis present

## 2016-02-07 DIAGNOSIS — Z880 Allergy status to penicillin: Secondary | ICD-10-CM

## 2016-02-07 DIAGNOSIS — K219 Gastro-esophageal reflux disease without esophagitis: Secondary | ICD-10-CM | POA: Diagnosis present

## 2016-02-07 DIAGNOSIS — J302 Other seasonal allergic rhinitis: Secondary | ICD-10-CM | POA: Diagnosis not present

## 2016-02-07 DIAGNOSIS — H547 Unspecified visual loss: Secondary | ICD-10-CM | POA: Diagnosis not present

## 2016-02-07 DIAGNOSIS — Z7902 Long term (current) use of antithrombotics/antiplatelets: Secondary | ICD-10-CM | POA: Diagnosis not present

## 2016-02-07 DIAGNOSIS — E785 Hyperlipidemia, unspecified: Secondary | ICD-10-CM | POA: Diagnosis present

## 2016-02-07 DIAGNOSIS — E7800 Pure hypercholesterolemia, unspecified: Secondary | ICD-10-CM | POA: Diagnosis not present

## 2016-02-07 DIAGNOSIS — E119 Type 2 diabetes mellitus without complications: Secondary | ICD-10-CM | POA: Diagnosis not present

## 2016-02-07 DIAGNOSIS — I63039 Cerebral infarction due to thrombosis of unspecified carotid artery: Secondary | ICD-10-CM | POA: Diagnosis not present

## 2016-02-07 DIAGNOSIS — R29703 NIHSS score 3: Secondary | ICD-10-CM | POA: Diagnosis not present

## 2016-02-07 DIAGNOSIS — I10 Essential (primary) hypertension: Secondary | ICD-10-CM | POA: Diagnosis not present

## 2016-02-07 DIAGNOSIS — F17200 Nicotine dependence, unspecified, uncomplicated: Secondary | ICD-10-CM | POA: Insufficient documentation

## 2016-02-07 DIAGNOSIS — I63531 Cerebral infarction due to unspecified occlusion or stenosis of right posterior cerebral artery: Secondary | ICD-10-CM | POA: Diagnosis not present

## 2016-02-07 DIAGNOSIS — F419 Anxiety disorder, unspecified: Secondary | ICD-10-CM | POA: Diagnosis not present

## 2016-02-07 DIAGNOSIS — I63419 Cerebral infarction due to embolism of unspecified middle cerebral artery: Secondary | ICD-10-CM | POA: Diagnosis not present

## 2016-02-07 DIAGNOSIS — I6789 Other cerebrovascular disease: Secondary | ICD-10-CM | POA: Diagnosis not present

## 2016-02-07 LAB — DIFFERENTIAL
BASOS ABS: 0 10*3/uL (ref 0–0.1)
BASOS PCT: 1 %
Eosinophils Absolute: 0.2 10*3/uL (ref 0–0.7)
Eosinophils Relative: 2 %
Lymphocytes Relative: 29 %
Lymphs Abs: 2.2 10*3/uL (ref 1.0–3.6)
MONO ABS: 0.4 10*3/uL (ref 0.2–0.9)
Monocytes Relative: 5 %
NEUTROS ABS: 4.7 10*3/uL (ref 1.4–6.5)
NEUTROS PCT: 63 %

## 2016-02-07 LAB — COMPREHENSIVE METABOLIC PANEL
ALT: 14 U/L (ref 14–54)
AST: 18 U/L (ref 15–41)
Albumin: 3.7 g/dL (ref 3.5–5.0)
Alkaline Phosphatase: 94 U/L (ref 38–126)
Anion gap: 6 (ref 5–15)
BUN: 13 mg/dL (ref 6–20)
CHLORIDE: 110 mmol/L (ref 101–111)
CO2: 20 mmol/L — AB (ref 22–32)
CREATININE: 0.79 mg/dL (ref 0.44–1.00)
Calcium: 9.2 mg/dL (ref 8.9–10.3)
GFR calc non Af Amer: >60 mL/min (ref >60)
Glucose, Bld: 231 mg/dL — ABNORMAL HIGH (ref 65–99)
Potassium: 4 mmol/L (ref 3.5–5.1)
SODIUM: 136 mmol/L (ref 135–145)
Total Bilirubin: 0.3 mg/dL (ref 0.3–1.2)
Total Protein: 7.1 g/dL (ref 6.5–8.1)

## 2016-02-07 LAB — CBC
HCT: 37.7 % (ref 35.0–47.0)
Hemoglobin: 12.9 g/dL (ref 12.0–16.0)
MCH: 32.1 pg (ref 26.0–34.0)
MCHC: 34.3 g/dL (ref 32.0–36.0)
MCV: 93.6 fL (ref 80.0–100.0)
PLATELETS: 276 10*3/uL (ref 150–440)
RBC: 4.03 MIL/uL (ref 3.80–5.20)
RDW: 13.9 % (ref 11.5–14.5)
WBC: 7.4 10*3/uL (ref 3.6–11.0)

## 2016-02-07 LAB — APTT: APTT: 27 s (ref 24–36)

## 2016-02-07 LAB — PROTIME-INR
INR: 0.89
PROTHROMBIN TIME: 12.3 s (ref 11.4–15.0)

## 2016-02-07 LAB — GLUCOSE, CAPILLARY
GLUCOSE-CAPILLARY: 73 mg/dL (ref 65–99)
GLUCOSE-CAPILLARY: 81 mg/dL (ref 65–99)
Glucose-Capillary: 99 mg/dL (ref 65–99)

## 2016-02-07 LAB — MRSA PCR SCREENING: MRSA BY PCR: NEGATIVE

## 2016-02-07 LAB — TROPONIN I: Troponin I: <0.03 ng/mL (ref <0.031)

## 2016-02-07 MED ORDER — MORPHINE SULFATE (PF) 2 MG/ML IV SOLN
INTRAVENOUS | Status: AC
  Administered 2016-02-07: 1 mg via INTRAVENOUS
  Filled 2016-02-07: qty 1

## 2016-02-07 MED ORDER — ACETAMINOPHEN 500 MG PO TABS
1000.0000 mg | ORAL_TABLET | Freq: Once | ORAL | Status: AC
  Administered 2016-02-07: 1000 mg via ORAL
  Filled 2016-02-07: qty 2

## 2016-02-07 MED ORDER — MORPHINE SULFATE (PF) 2 MG/ML IV SOLN
1.0000 mg | Freq: Four times a day (QID) | INTRAVENOUS | Status: DC | PRN
  Administered 2016-02-07 – 2016-02-08 (×4): 1 mg via INTRAVENOUS
  Filled 2016-02-07 (×3): qty 1

## 2016-02-07 MED ORDER — METOPROLOL SUCCINATE ER 100 MG PO TB24
200.0000 mg | ORAL_TABLET | Freq: Every day | ORAL | Status: DC
  Administered 2016-02-08 – 2016-02-09 (×2): 200 mg via ORAL
  Filled 2016-02-07: qty 8
  Filled 2016-02-07: qty 2

## 2016-02-07 MED ORDER — METFORMIN HCL 500 MG PO TABS
1000.0000 mg | ORAL_TABLET | Freq: Two times a day (BID) | ORAL | Status: DC
  Administered 2016-02-08 (×2): 1000 mg via ORAL
  Filled 2016-02-07 (×2): qty 2

## 2016-02-07 MED ORDER — ACETAMINOPHEN 650 MG RE SUPP: 650.0000 mg | RECTAL | Status: DC | PRN

## 2016-02-07 MED ORDER — ATORVASTATIN CALCIUM 80 MG PO TABS
80.0000 mg | ORAL_TABLET | Freq: Every day | ORAL | Status: DC
  Administered 2016-02-08: 80 mg via ORAL
  Filled 2016-02-07: qty 1

## 2016-02-07 MED ORDER — MORPHINE SULFATE (PF) 2 MG/ML IV SOLN
1.0000 mg | Freq: Once | INTRAVENOUS | Status: AC
  Administered 2016-02-07: 1 mg via INTRAVENOUS
  Filled 2016-02-07: qty 1

## 2016-02-07 MED ORDER — ACETAMINOPHEN 325 MG PO TABS
650.0000 mg | ORAL_TABLET | ORAL | Status: DC | PRN
  Administered 2016-02-07 – 2016-02-08 (×2): 650 mg via ORAL
  Filled 2016-02-07 (×2): qty 2

## 2016-02-07 MED ORDER — SODIUM CHLORIDE 0.9 % IV SOLN
INTRAVENOUS | Status: DC
  Administered 2016-02-07 – 2016-02-08 (×2): via INTRAVENOUS

## 2016-02-07 MED ORDER — MORPHINE SULFATE (PF) 2 MG/ML IV SOLN: 1.0000 mg | Freq: Once | INTRAVENOUS | Status: DC

## 2016-02-07 MED ORDER — LOSARTAN POTASSIUM 50 MG PO TABS
100.0000 mg | ORAL_TABLET | Freq: Every day | ORAL | Status: DC
  Administered 2016-02-08 – 2016-02-09 (×2): 100 mg via ORAL
  Filled 2016-02-07 (×2): qty 2

## 2016-02-07 MED ORDER — SODIUM CHLORIDE 0.9 % IV SOLN: 50.0000 mL | Freq: Once | INTRAVENOUS | Status: DC

## 2016-02-07 MED ORDER — STROKE: EARLY STAGES OF RECOVERY BOOK
Freq: Once | Status: DC
  Filled 2016-02-07: qty 1

## 2016-02-07 MED ORDER — INSULIN ASPART 100 UNIT/ML ~~LOC~~ SOLN
4.0000 [IU] | Freq: Once | SUBCUTANEOUS | Status: AC
  Administered 2016-02-07: 4 [IU] via SUBCUTANEOUS
  Filled 2016-02-07: qty 4

## 2016-02-07 MED ORDER — IOPAMIDOL (ISOVUE-370) INJECTION 76%
INTRAVENOUS | Status: AC
  Administered 2016-02-07: 50 mL
  Filled 2016-02-07: qty 50

## 2016-02-07 MED ORDER — SENNOSIDES-DOCUSATE SODIUM 8.6-50 MG PO TABS: 1.0000 | ORAL_TABLET | Freq: Every evening | ORAL | Status: DC | PRN

## 2016-02-07 MED ORDER — LEVOTHYROXINE SODIUM 50 MCG PO TABS
50.0000 ug | ORAL_TABLET | Freq: Every day | ORAL | Status: DC
  Administered 2016-02-08: 50 ug via ORAL
  Filled 2016-02-07: qty 1

## 2016-02-07 MED ORDER — ALTEPLASE 100 MG IV SOLR
INTRAVENOUS | Status: AC
  Administered 2016-02-07: 63 mg
  Filled 2016-02-07: qty 1

## 2016-02-07 MED ORDER — PANTOPRAZOLE SODIUM 40 MG IV SOLR
40.0000 mg | Freq: Every day | INTRAVENOUS | Status: DC
  Administered 2016-02-07: 40 mg via INTRAVENOUS
  Filled 2016-02-07: qty 40

## 2016-02-07 MED ORDER — AMLODIPINE BESYLATE 10 MG PO TABS
10.0000 mg | ORAL_TABLET | Freq: Every day | ORAL | Status: DC
  Administered 2016-02-08 – 2016-02-09 (×2): 10 mg via ORAL
  Filled 2016-02-07 (×2): qty 1

## 2016-02-07 MED ORDER — ALTEPLASE (STROKE) FULL DOSE INFUSION
0.9000 mg/kg | Freq: Once | INTRAVENOUS | Status: DC
  Filled 2016-02-07: qty 100

## 2016-02-07 MED ORDER — LABETALOL HCL 5 MG/ML IV SOLN: 10.0000 mg | INTRAVENOUS | Status: DC | PRN

## 2016-02-07 NOTE — Consult Note (Signed)
Referring Physician: Edd Fabian    Chief Complaint: Loss of vision  HPI: Erin Yates is an 70 y.o. female with a history of CVA and PFO closure who reports waking up normal today.  At 0830 had the acute onset of right sided headache and loss of vision, worse on the left.  Patient reports some right peripheral vision loss as a consequence of a previous stroke.  Has some numbness below the knee on her right leg as a consequence of a previous stroke as well.  Initial NIHSS of 4.  Patient on Plavix at home.   Rates headache at 8/10.  Date last known well: 02/07/2016 Time last known well: Time: 08:30 tPA Given: Yes  MRankin:  1  Past Medical History  Diagnosis Date  . Hypertension   . PFO (patent foramen ovale)     correction device placed 2010  . Dysrhythmia     prior to PFO correction  . Stroke [facility])     prior to PFO correction  . GERD (gastroesophageal reflux disease)   . Seasonal allergies   . Depression   . Diabetes mellitus without complication ([place])   . Hypercholesteremia   . Hypothyroidism   . Anxiety   . Right leg numbness     lower leg- residual from CVA  . 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 Anemia     past  hx  . Headache     before PFO closure    Past Surgical History  Procedure Laterality Date  . Abdominal hysterectomy    . Nasal sinus surgery    . Patent foramen ovale closure    . Foot neuroma surgery Left   . Cataract extraction w/phaco Right 09/01/2015    Procedure: CATARACT EXTRACTION PHACO AND INTRAOCULAR LENS PLACEMENT (IOC);  Surgeon: Leandrew Koyanagi, MD;  Location: [place];  Service: Ophthalmology;  Laterality: Right;  DIABETIC - oral meds  . Cataract extraction w/phaco Left 10/20/2015    Procedure: CATARACT EXTRACTION PHACO AND INTRAOCULAR LENS PLACEMENT (IOC);  Surgeon: Leandrew Koyanagi, MD;  Location: [place];  Service: Ophthalmology;  Laterality: Left;  DIABETIC - oral meds    Family History  Problem  Relation Age of Onset  . Breast cancer Sister 88   Social History:  reports that she has been smoking.  She does not have any smokeless tobacco history on file. She reports that she does not drink alcohol. Her drug history is not on file.  Allergies:  Allergies  Allergen Reactions  . Penicillins Rash    Also swelling and itching    Medications: I have reviewed the patient's current medications. Prior to Admission:  Prior to Admission medications   Medication Sig Start Date End Date Taking? Authorizing Provider  ALPRAZolam (XANAX) 0.25 MG tablet Take 0.25 mg by mouth 2 (two) times daily as needed for anxiety.    Historical Provider, MD  amLODipine (NORVASC) 10 MG tablet Take 10 mg by mouth daily. PM    Historical Provider, MD  atorvastatin (LIPITOR) 80 MG tablet Take 80 mg by mouth daily. PM    Historical Provider, MD  buPROPion (WELLBUTRIN XL) 300 MG 24 hr tablet Take 300 mg by mouth daily. AM    Historical Provider, MD  clopidogrel (PLAVIX) 75 MG tablet Take 75 mg by mouth daily. AM    Historical Provider, MD  ergocalciferol (VITAMIN D2) 50000 UNITS capsule Take 50,000 Units by mouth once a week.    Historical  Provider, MD  fluticasone (FLONASE) 50 MCG/ACT nasal spray Place into both nostrils daily as needed for allergies or rhinitis.    Historical Provider, MD  levothyroxine (SYNTHROID, LEVOTHROID) 50 MCG tablet Take 50 mcg by mouth daily before breakfast.    Historical Provider, MD  losartan (COZAAR) 100 MG tablet Take 100 mg by mouth daily. AM    Historical Provider, MD  metFORMIN (GLUCOPHAGE) 1000 MG tablet Take 1,000 mg by mouth 2 (two) times daily with a meal.    Historical Provider, MD  metoprolol (TOPROL-XL) 200 MG 24 hr tablet Take 200 mg by mouth daily. AM    Historical Provider, MD  pantoprazole (PROTONIX) 40 MG tablet Take 40 mg by mouth 2 (two) times daily.    Historical Provider, MD  venlafaxine XR (EFFEXOR-XR) 150 MG 24 hr capsule Take 150 mg by mouth daily with breakfast.     Historical Provider, MD  vitamin B-12 (CYANOCOBALAMIN) 500 MCG tablet Take 500 mcg by mouth daily. AM    Historical Provider, MD  zolpidem (AMBIEN) 5 MG tablet Take 5 mg by mouth at bedtime as needed for sleep.    Historical Provider, MD    ROS: History obtained from the patient  General ROS: negative for - chills, fatigue, fever, night sweats, weight gain or weight loss Psychological ROS: negative for - behavioral disorder, hallucinations, memory difficulties, mood swings or suicidal ideation Ophthalmic ROS: negative for - blurry vision, double vision, eye pain or loss of vision ENT ROS: negative for - epistaxis, nasal discharge, oral lesions, sore throat, tinnitus or vertigo Allergy and Immunology ROS: negative for - hives or itchy/watery eyes Hematological and Lymphatic ROS: negative for - bleeding problems, bruising or swollen lymph nodes Endocrine ROS: negative for - galactorrhea, hair pattern changes, polydipsia/polyuria or temperature intolerance Respiratory ROS: negative for - cough, hemoptysis, shortness of breath or wheezing Cardiovascular ROS: negative for - chest pain, dyspnea on exertion, edema or irregular heartbeat Gastrointestinal ROS: negative for - abdominal pain, diarrhea, hematemesis, nausea/vomiting or stool incontinence Genito-Urinary ROS: negative for - dysuria, hematuria, incontinence or urinary frequency/urgency Musculoskeletal ROS: negative for - joint swelling or muscular weakness Neurological ROS: as noted in HPI Dermatological ROS: negative for rash and skin lesion changes  Physical Examination: Blood pressure 153/63, pulse 56, temperature 97.9 F (36.6 C), temperature source Oral, resp. rate 18, weight 70.4 kg (155 lb 3.3 oz), SpO2 97 %.  HEENT-  Normocephalic, no lesions, without obvious abnormality.  Normal external eye and conjunctiva.  Normal TM's bilaterally.  Normal auditory canals and external ears. Normal external nose, mucus membranes and septum.   Normal pharynx. Cardiovascular- S1, S2 normal, pulses palpable throughout   Lungs- chest clear, no wheezing, rales, normal symmetric air entry Abdomen- soft, non-tender; bowel sounds normal; no masses,  no organomegaly Extremities- no edema Lymph-no adenopathy palpable Musculoskeletal-no joint tenderness, deformity or swelling Skin-warm and dry, no hyperpigmentation, vitiligo, or suspicious lesions  Neurological Examination Mental Status: Alert, unable to give the month.  Speech fluent without evidence of aphasia.  Able to follow 3 step commands without difficulty. Cranial Nerves: II: Discs flat bilaterally; Visual fields testing very inconsistent but does have a consistent limitation in her right peripheral field.  Blinks to confrontation on the left.  Pupils equal, round, reactive to light and accommodation III,IV, VI: ptosis not present, extra-ocular motions intact bilaterally V,VII: smile symmetric, facial light touch sensation decreased on the left on the forehead VIII: hearing normal bilaterally IX,X: gag reflex present XI: bilateral shoulder shrug XII:  midline tongue extension Motor: Although initially sable to maintain all extremities against gravity with formal testing has give-way weakness in both upper extremities and will not provide resistance.  No focal lower extremity weakness noted.   Sensory: Pinprick and light touch decreased below the knee on the RLE Deep Tendon Reflexes: 2+ and symmetric throughout Plantars: Right: downgoing   Left: downgoing Cerebellar: With finger to nose testing patient unable to find her nose or the finger in front of her making near misses on each that are consistent.  Normal heel-to-shin testing bilaterally Gait: not tested due to safety concerns.  Patient guarded.     Laboratory Studies:  Basic Metabolic Panel:  Recent Labs Lab 02/07/16 1104  NA 136  K 4.0  CL 110  CO2 20*  GLUCOSE 231*  BUN 13  CREATININE 0.79  CALCIUM 9.2     Liver Function Tests:  Recent Labs Lab 02/07/16 1104  AST 18  ALT 14  ALKPHOS 94  BILITOT 0.3  PROT 7.1  ALBUMIN 3.7   No results for input(s): LIPASE, AMYLASE in the last 168 hours. No results for input(s): AMMONIA in the last 168 hours.  CBC:  Recent Labs Lab 02/07/16 1104  WBC 7.4  NEUTROABS 4.7  HGB 12.9  HCT 37.7  MCV 93.6  PLT 276    Cardiac Enzymes:  Recent Labs Lab 02/07/16 1104  TROPONINI <0.03    BNP: Invalid input(s): POCBNP  CBG: No results for input(s): GLUCAP in the last 168 hours.  Microbiology: No results found for this or any previous visit.  Coagulation Studies:  Recent Labs  02/07/16 1104  LABPROT 12.3  INR 0.89    Urinalysis: No results for input(s): COLORURINE, LABSPEC, PHURINE, GLUCOSEU, HGBUR, BILIRUBINUR, KETONESUR, PROTEINUR, UROBILINOGEN, NITRITE, LEUKOCYTESUR in the last 168 hours.  Invalid input(s): APPERANCEUR  Lipid Panel: No results found for: CHOL, TRIG, HDL, CHOLHDL, VLDL, LDLCALC  HgbA1C: No results found for: HGBA1C  Urine Drug Screen:  No results found for: LABOPIA, COCAINSCRNUR, LABBENZ, AMPHETMU, THCU, LABBARB  Alcohol Level: No results for input(s): ETH in the last 168 hours.  Other results: EKG: normal sinus rhythm at 87 bpm, prolonged PR interval.  Imaging: Ct Head Wo Contrast  02/07/2016  ADDENDUM REPORT: 02/07/2016 11:15 ADDENDUM: These results were called by telephone at the time of interpretation on 02/07/2016 at 11:10 am to Dr. Lavonia Drafts , who verbally acknowledged these results. Electronically Signed   By: Genia Del M.D.   On: 02/07/2016 11:15  02/07/2016  CLINICAL DATA:  70 year old hypertensive female with visual loss greater on the left since this morning. Initial encounter. EXAM: CT HEAD WITHOUT CONTRAST TECHNIQUE: Contiguous axial images were obtained from the base of the skull through the vertex without intravenous contrast. COMPARISON:  11/24/2010. FINDINGS: No intracranial  hemorrhage. Remote left frontal lobe, left parietal-occipital lobe, right caudate head region/ anterior limb right internal capsule and posterior right lenticular nucleus infarct. Right parietal -occipital lobe infarct of indeterminate age. 1 cm calcification anterior right parafalcine region unchanged possibly a small calcified meningioma. Only portion of the orbits are visualized and appear unremarkable. Vascular calcifications. IMPRESSION: No intracranial hemorrhage. Remote left frontal lobe, left parietal-occipital lobe, right caudate head region/ anterior limb right internal capsule and posterior right lenticular nucleus infarcts. Right parietal -occipital lobe infarct of indeterminate age. 1 cm calcification anterior right parafalcine region unchanged possibly a small calcified meningioma. Electronically Signed: By: Genia Del M.D. On: 02/07/2016 11:10   Mr [employer] W/o Cm  02/07/2016  CLINICAL DATA:  Stroke.  Vision loss EXAM: MRI HEAD WITHOUT CONTRAST TECHNIQUE: Multiplanar, multiecho pulse sequences of the brain and surrounding structures were obtained without intravenous contrast. COMPARISON:  CT head 02/07/2016 FINDINGS: The exam was supervised by Dr. Doy Mince, neurohospitalist. Single sequence performed. Axial diffusion imaging only was performed. Acute infarct in the right occipital parietal white matter. Acute infarct in the right posterior cingulate gyrus on the right. Small area of acute infarct in the right parietal convexity. IMPRESSION: Acute infarcts in the right parietal lobe and right posterior temporal parietal lobe in the cingulate gyrus. Examination limited to diffusion imaging only. Electronically Signed   By: Franchot Gallo M.D.   On: 02/07/2016 11:54    Assessment: 70 y.o. female presenting with loss of vision bilaterally.  Initial head CT personally reviewed and shows chronic infarcts but no acute hemorrhage.  Due to inconsistent neurological examination, it was not clear as to  whether her presentation represented a complicated migraine versus an acute infarct.  MRI of the brain was ordered STAT and diffusion images personally reviewed.  They revealed acute small right parietal lobe and posterior temporal lobe infarcts.  Exam was not completed.  Patient was brought back to the room.  Case discussed with Dr. Leonie Man.  Contraindications for tPA were reviewed.  Risks and benefits were discussed with patient and family and verbal consent obtained.  tPA administered and transfer coordinated to [facility].  Dr. Silverio Decamp the accepting physician.    Stroke Risk Factors - diabetes mellitus, hyperlipidemia and hypertension  Plan: 1. HgbA1c, fasting lipid panel 2. MRI, MRA  of the brain without contrast to be completed 3. PT consult, OT consult, Speech consult 4. Echocardiogram 5. Carotid dopplers 6. Prophylactic therapy-None 7. NPO until RN stroke swallow screen 8. Telemetry monitoring 9. Frequent neuro checks 10.  Elevated BS addressed    Alexis Goodell, MD Neurology [phone] 02/07/2016, 12:39 PM

## 2016-02-07 NOTE — ED Provider Notes (Signed)
[facility] Emergency Department Provider Note  ____________________________________________  Time seen: Approximately 11:10 AM  I have reviewed the triage vital signs and the nursing notes.   HISTORY  Chief Complaint Loss of Vision    HPI Erin Yates is a 70 y.o. female with history of hypertension, GERD, diabetes, hypercholesterolemia who presents for evaluation of bilateral painless decrease in vision today at 8:30 AM, gradual onset, constant since onset, left greater than right, no modifying factors. She is also complaining of some headache. No numbness or weakness, no word finding difficulties, no dizziness. No chest pain or difficulty breathing.   Past Medical History  Diagnosis Date  . Hypertension   . PFO (patent foramen ovale)     correction device placed 2010  . Dysrhythmia     prior to PFO correction  . Stroke [facility])     prior to PFO correction  . GERD (gastroesophageal reflux disease)   . Seasonal allergies   . Depression   . Diabetes mellitus without complication ([place])   . Hypercholesteremia   . Hypothyroidism   . Anxiety   . Right leg numbness     lower leg- residual from CVA  . 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 Anemia     past  hx  . Headache     before PFO closure    There are no active problems to display for this patient.   Past Surgical History  Procedure Laterality Date  . Abdominal hysterectomy    . Nasal sinus surgery    . Patent foramen ovale closure    . Foot neuroma surgery Left   . Cataract extraction w/phaco Right 09/01/2015    Procedure: CATARACT EXTRACTION PHACO AND INTRAOCULAR LENS PLACEMENT (IOC);  Surgeon: Leandrew Koyanagi, MD;  Location: [place];  Service: Ophthalmology;  Laterality: Right;  DIABETIC - oral meds  . Cataract extraction w/phaco Left 10/20/2015    Procedure: CATARACT EXTRACTION PHACO AND INTRAOCULAR LENS PLACEMENT (IOC);  Surgeon: Leandrew Koyanagi, MD;   Location: [place];  Service: Ophthalmology;  Laterality: Left;  DIABETIC - oral meds    Current Outpatient Rx  Name  Route  Sig  Dispense  Refill  . ALPRAZolam (XANAX) 0.25 MG tablet   Oral   Take 0.25 mg by mouth 2 (two) times daily as needed for anxiety.         Marland Kitchen amLODipine (NORVASC) 10 MG tablet   Oral   Take 10 mg by mouth daily. PM         . atorvastatin (LIPITOR) 80 MG tablet   Oral   Take 80 mg by mouth daily. PM         . buPROPion (WELLBUTRIN XL) 300 MG 24 hr tablet   Oral   Take 300 mg by mouth daily. AM         . clopidogrel (PLAVIX) 75 MG tablet   Oral   Take 75 mg by mouth daily. AM         . ergocalciferol (VITAMIN D2) 50000 UNITS capsule   Oral   Take 50,000 Units by mouth once a week.         . fluticasone (FLONASE) 50 MCG/ACT nasal spray   Each Nare   Place into both nostrils daily as needed for allergies or rhinitis.         Marland Kitchen levothyroxine (SYNTHROID, LEVOTHROID) 50 MCG tablet   Oral   Take 50 mcg by  mouth daily before breakfast.         . losartan (COZAAR) 100 MG tablet   Oral   Take 100 mg by mouth daily. AM         . metFORMIN (GLUCOPHAGE) 1000 MG tablet   Oral   Take 1,000 mg by mouth 2 (two) times daily with a meal.         . metoprolol (TOPROL-XL) 200 MG 24 hr tablet   Oral   Take 200 mg by mouth daily. AM         . pantoprazole (PROTONIX) 40 MG tablet   Oral   Take 40 mg by mouth 2 (two) times daily.         Marland Kitchen venlafaxine XR (EFFEXOR-XR) 150 MG 24 hr capsule   Oral   Take 150 mg by mouth daily with breakfast.         . vitamin B-12 (CYANOCOBALAMIN) 500 MCG tablet   Oral   Take 500 mcg by mouth daily. AM         . zolpidem (AMBIEN) 5 MG tablet   Oral   Take 5 mg by mouth at bedtime as needed for sleep.           Allergies Penicillins  Family History  Problem Relation Age of Onset  . Breast cancer Sister 37    Social History Social History  Substance Use Topics  . Smoking  status: Current Every Day Smoker -- 0.50 packs/day for 25 years  . Smokeless tobacco: None  . Alcohol Use: No    Review of Systems Constitutional: No fever/chills Eyes: No visual changes. ENT: No sore throat. Cardiovascular: Denies chest pain. Respiratory: Denies shortness of breath. Gastrointestinal: No abdominal pain.  No nausea, no vomiting.  No diarrhea.  No constipation. Genitourinary: Negative for dysuria. Musculoskeletal: Negative for back pain. Skin: Negative for rash. Neurological: Positive for headaches, no focal weakness or numbness.  10-point ROS otherwise negative.  ____________________________________________   PHYSICAL EXAM:  VITAL SIGNS: ED Triage Vitals  Enc Vitals Group     BP 02/07/16 1104 165/67 mmHg     Pulse Rate 02/07/16 1104 59     Resp 02/07/16 1104 16     Temp 02/07/16 1104 97.9 F (36.6 C)     Temp Source 02/07/16 1104 Oral     SpO2 02/07/16 1104 96 %     Weight 02/07/16 1104 155 lb 3.3 oz (70.4 kg)     Height --      Head Cir --      Peak Flow --      Pain Score 02/07/16 1046 10     Pain Loc --      Pain Edu? --      Excl. in [place]? --     Constitutional: Alert and oriented. Well appearing and in no acute distress. Eyes: Conjunctivae are normal. PERRL. EOMI. Head: Atraumatic. Nose: No congestion/rhinnorhea. Mouth/Throat: Mucous membranes are moist.  Oropharynx non-erythematous. Neck: No stridor.  Supple without meningismus. Cardiovascular: Normal rate, regular rhythm. Grossly normal heart sounds.  Good peripheral circulation. Respiratory: Normal respiratory effort.  No retractions. Lungs CTAB. Gastrointestinal: Soft and nontender. No distention. No CVA tenderness. Genitourinary: deferred Musculoskeletal: No lower extremity tenderness nor edema.  No joint effusions. Neurologic:  Normal speech and language. 5 out of 5 strength bilateral upper and lower extremities, sensation intact to light touch throughout. Significantly decreased vision  in both eyes left greater than right. Cranial nerves II through XII intact. Skin:  Skin is warm, dry and intact. No rash noted. Psychiatric: Mood and affect are normal. Speech and behavior are normal.  ____________________________________________   LABS (all labs ordered are listed, but only abnormal results are displayed)  Labs Reviewed  COMPREHENSIVE METABOLIC PANEL - Abnormal; Notable for the following:    CO2 20 (*)    Glucose, Bld 231 (*)    All other components within normal limits  PROTIME-INR  APTT  CBC  DIFFERENTIAL  TROPONIN I  CBG MONITORING, ED   ____________________________________________  EKG  ED ECG REPORT I, Joanne Gavel, the attending physician, personally viewed and interpreted this ECG.   Date: 02/07/2016  EKG Time: 11:05  Rate: 58  Rhythm: sinus rhythm  Axis: normal  Intervals: First degree AV block.  ST&T Change: No acute ST elevation. LVH.  ____________________________________________  RADIOLOGY  CT head IMPRESSION: No intracranial hemorrhage.  Remote left frontal lobe, left parietal-occipital lobe, right caudate head region/ anterior limb right internal capsule and posterior right lenticular nucleus infarcts.  Right parietal -occipital lobe infarct of indeterminate age.  1 cm calcification anterior right parafalcine region unchanged possibly a small calcified meningioma.   MRI brain IMPRESSION: Acute infarcts in the right parietal lobe and right posterior temporal parietal lobe in the cingulate gyrus.  Examination limited to diffusion imaging only. ____________________________________________   PROCEDURES  Procedure(s) performed: None  Critical Care performed: Yes, see critical care note(s). Total critical care time spent 30 minutes.  ____________________________________________   INITIAL IMPRESSION / ASSESSMENT AND PLAN / ED COURSE  Pertinent labs & imaging results that were available during my care of the patient  were reviewed by me and considered in my medical decision making (see chart for details).  Erin Yates is a 70 y.o. female with history of hypertension, GERD, diabetes, hypercholesterolemia who presents for evaluation of bilateral painless decrease in vision today at 8:30 AM. On exam she is well-appearing and no acute distress. Vital signs stable, she is afebrile. She does have significantly decreased vision in both eyes however her cranial nerves appear intact. CT scan with right parietal occipital lobe infarct of indeterminate age. Code stroke was initiated on her arrival. NIH stroke scale 3. Dr. Doy Mince of neurology at bedside for consultation.  ----------------------------------------- 12:07 PM on 02/07/2016 ----------------------------------------- Dr. Doy Mince has evaluated, recommends tPa. She has consented patient for tPa and we will transfer patient to [facility]. Discussed with Dr. Silverio Decamp who will accept the patient. ____________________________________________   FINAL CLINICAL IMPRESSION(S) / ED DIAGNOSES  Final diagnoses:  Cerebral infarction due to unspecified mechanism      Joanne Gavel, MD 02/07/16 1235

## 2016-02-07 NOTE — ED Notes (Signed)
Reports around 0830 lost vision in both eyes , worse on the left.

## 2016-02-07 NOTE — ED Notes (Signed)
Tech (pam) transported pt to MRI.

## 2016-02-07 NOTE — ED Notes (Addendum)
Stroke nurse comments:  Pt arrived from home via POV at 10:44, pt reports sudden loss of vision bilateral (more so on rt side but has had 5 prior CVAs and has no rt side peripheral vision).  Pt also having transient confusion, when asked what month we are in, pt responded July.  On exam, pt also co rt face numbness.  NIHSS 4.  MR brain ltd ordered.  MR confirmed acute infarcts in the right parietal lobe and right posterior temporal parietal lobe in the cingulate gyrus.  Pt agreed to alteplase.  Alteplase mixed at bedside, started at 1156, transport arranged for [facility], [facility] to advise of ETA. Maudie Mercury RN, primary nurse updated on pt status and frequency of VS and neuro checks.

## 2016-02-07 NOTE — Progress Notes (Signed)
Arrived to our unit 1358, alert, h/a, MAE, visual deficits, oriented to self only.

## 2016-02-07 NOTE — ED Notes (Signed)
Patient transported to MRI 

## 2016-02-07 NOTE — ED Notes (Signed)
Called MR tech to inform of pt's code stroke status, inside treatment window, MR stat.

## 2016-02-07 NOTE — H&P (Signed)
History and physical        HPI:                                                                                                                                         Erin Yates is an 70 y.o. female with a history of CVA and PFO closure who reports waking up normal today. At 0830 had the acute onset of right sided headache and loss of vision, worse on the left. Patient reports some right peripheral vision loss as a consequence of a previous stroke. Has some numbness below the knee on her right leg as a consequence of a previous stroke as well. Initial NIHSS of 4. Patient on Plavix at home. MRI brain obtained and showed revealed acute small right parietal lobe and posterior temporal lobe infarcts. Tpa was started and patient was transported to [facility].   Date last known well: 02/07/2016 Time last known well: Time: 08:30 tPA Given: Yes  MRankin: 1   Past Medical History  Diagnosis Date  . Hypertension   . PFO (patent foramen ovale)     correction device placed 2010  . Dysrhythmia     prior to PFO correction  . Stroke [facility])     prior to PFO correction  . GERD (gastroesophageal reflux disease)   . Seasonal allergies   . Depression   . Diabetes mellitus without complication ([place])   . Hypercholesteremia   . Hypothyroidism   . Anxiety   . Right leg numbness     lower leg- residual from CVA  . 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 Anemia     past  hx  . Headache     before PFO closure    Past Surgical History  Procedure Laterality Date  . Abdominal hysterectomy    . Nasal sinus surgery    . Patent foramen ovale closure    . Foot neuroma surgery Left   . Cataract extraction w/phaco Right 09/01/2015    Procedure: CATARACT EXTRACTION PHACO AND INTRAOCULAR LENS PLACEMENT (IOC);  Surgeon: Leandrew Koyanagi, MD;  Location: [place];  Service: Ophthalmology;  Laterality: Right;  DIABETIC - oral meds  . Cataract extraction w/phaco Left  10/20/2015    Procedure: CATARACT EXTRACTION PHACO AND INTRAOCULAR LENS PLACEMENT (IOC);  Surgeon: Leandrew Koyanagi, MD;  Location: [place];  Service: Ophthalmology;  Laterality: Left;  DIABETIC - oral meds    Family History  Problem Relation Age of Onset  . Breast cancer Sister 13   Social History:  reports that she has been smoking.  She does not have any smokeless tobacco history on file. She reports that she does not drink alcohol. Her drug history is not on file.  Allergies:  Allergies  Allergen Reactions  . Penicillins Rash    Also swelling and itching    Medications:  Prior to Admission:  Prescriptions prior to admission  Medication Sig Dispense Refill Last Dose  . ALPRAZolam (XANAX) 0.25 MG tablet Take 0.25 mg by mouth 2 (two) times daily as needed for anxiety.   Past Week at Unknown time  . amLODipine (NORVASC) 10 MG tablet Take 10 mg by mouth daily. PM   10/19/2015 at Unknown time  . atorvastatin (LIPITOR) 80 MG tablet Take 80 mg by mouth daily. PM   10/19/2015 at Unknown time  . buPROPion (WELLBUTRIN XL) 300 MG 24 hr tablet Take 300 mg by mouth daily. AM   10/19/2015 at Unknown time  . clopidogrel (PLAVIX) 75 MG tablet Take 75 mg by mouth daily. AM   10/19/2015 at Unknown time  . ergocalciferol (VITAMIN D2) 50000 UNITS capsule Take 50,000 Units by mouth once a week.   Past Week at Unknown time  . fluticasone (FLONASE) 50 MCG/ACT nasal spray Place into both nostrils daily as needed for allergies or rhinitis.   Past Week at Unknown time  . levothyroxine (SYNTHROID, LEVOTHROID) 50 MCG tablet Take 50 mcg by mouth daily before breakfast.   10/20/2015 at 0550  . losartan (COZAAR) 100 MG tablet Take 100 mg by mouth daily. AM   10/19/2015 at Unknown time  . metFORMIN (GLUCOPHAGE) 1000 MG tablet Take 1,000 mg by mouth 2 (two) times daily with a meal.    10/19/2015 at Unknown time  . metoprolol (TOPROL-XL) 200 MG 24 hr tablet Take 200 mg by mouth daily. AM   10/20/2015 at 0550  . pantoprazole (PROTONIX) 40 MG tablet Take 40 mg by mouth 2 (two) times daily.   10/20/2015 at 0500  . venlafaxine XR (EFFEXOR-XR) 150 MG 24 hr capsule Take 150 mg by mouth daily with breakfast.   10/19/2015 at Unknown time  . vitamin B-12 (CYANOCOBALAMIN) 500 MCG tablet Take 500 mcg by mouth daily. AM   Past Week at Unknown time  . zolpidem (AMBIEN) 5 MG tablet Take 5 mg by mouth at bedtime as needed for sleep.   10/19/2015 at Unknown time    ROS:                                                                                                                                       History obtained from the patient  General ROS: negative for - chills, fatigue, fever, night sweats, weight gain or weight loss Psychological ROS: negative for - behavioral disorder, hallucinations, memory difficulties, mood swings or suicidal ideation Ophthalmic ROS: negative for - blurry vision, double vision, eye pain or loss of vision ENT ROS: negative for - epistaxis, nasal discharge, oral lesions, sore throat, tinnitus or vertigo Allergy and Immunology ROS: negative for - hives or itchy/watery eyes Hematological and Lymphatic ROS: negative for - bleeding problems, bruising or swollen lymph nodes Endocrine ROS: negative for - galactorrhea, hair pattern changes, polydipsia/polyuria or temperature intolerance Respiratory ROS: negative for -  cough, hemoptysis, shortness of breath or wheezing Cardiovascular ROS: negative for - chest pain, dyspnea on exertion, edema or irregular heartbeat Gastrointestinal ROS: negative for - abdominal pain, diarrhea, hematemesis, nausea/vomiting or stool incontinence Genito-Urinary ROS: negative for - dysuria, hematuria, incontinence or urinary frequency/urgency Musculoskeletal ROS: negative for - joint swelling or muscular weakness Neurological ROS: as  noted in HPI Dermatological ROS: negative for rash and skin lesion changes  Neurologic Examination:                                                                                                      Blood pressure 134/59, pulse 56, resp. rate 12, SpO2 91 %.  HEENT-  Normocephalic, no lesions, without obvious abnormality.  Normal external eye and conjunctiva.  Normal TM's bilaterally.  Normal auditory canals and external ears. Normal external nose, mucus membranes and septum.  Normal pharynx. Cardiovascular- S1, S2 normal, pulses palpable throughout   Lungs- chest clear, no wheezing, rales, normal symmetric air entry Abdomen- normal findings: no bruits heard Extremities- no edema Lymph-no adenopathy palpable Musculoskeletal-no joint tenderness, deformity or swelling Skin-warm and dry, no hyperpigmentation, vitiligo, or suspicious lesions  Neurological Examination Mental Status: Alert, oriented, thought content appropriate.  Speech fluent without evidence of aphasia.  Able to follow 3 step commands without difficulty. Cranial Nerves: II:  Able to see on the right peripheral field otherwise no vision--this was with each eye covered. Of note: this is very different than previous exam--however it was noted on previous exam there were notable inconsistencies.  , pupils equal, round, reactive to light and accommodation III,IV, VI: ptosis not present, extra-ocular motions intact bilaterally V,VII: smile symmetric, facial light touch sensation normal bilaterally VIII: hearing normal bilaterally IX,X: uvula rises symmetrically XI: bilateral shoulder shrug XII: midline tongue extension Motor: Right : Upper extremity   5/5    Left:     Upper extremity   5/5  Lower extremity   5/5     Lower extremity   5/5 Tone and bulk:normal tone throughout; no atrophy noted Sensory: Pinprick and light touch intact throughout, bilaterally Deep Tendon Reflexes: 2+ and symmetric throughout Plantars: Right:  downgoing   Left: downgoing Cerebellar: normal finger-to-nose,  and normal heel-to-shin test Gait: not tested       Lab Results: Basic Metabolic Panel:  Recent Labs Lab 02/07/16 1104  NA 136  K 4.0  CL 110  CO2 20*  GLUCOSE 231*  BUN 13  CREATININE 0.79  CALCIUM 9.2    Liver Function Tests:  Recent Labs Lab 02/07/16 1104  AST 18  ALT 14  ALKPHOS 94  BILITOT 0.3  PROT 7.1  ALBUMIN 3.7   No results for input(s): LIPASE, AMYLASE in the last 168 hours. No results for input(s): AMMONIA in the last 168 hours.  CBC:  Recent Labs Lab 02/07/16 1104  WBC 7.4  NEUTROABS 4.7  HGB 12.9  HCT 37.7  MCV 93.6  PLT 276    Cardiac Enzymes:  Recent Labs Lab 02/07/16 1104  TROPONINI <0.03    Lipid Panel: No results for input(s): CHOL, TRIG,  HDL, CHOLHDL, VLDL, LDLCALC in the last 168 hours.  CBG: No results for input(s): GLUCAP in the last 168 hours.  Microbiology: No results found for this or any previous visit.  Coagulation Studies:  Recent Labs  02/07/16 1104  LABPROT 12.3  INR 0.89    Imaging: Ct Head Wo Contrast  02/07/2016  ADDENDUM REPORT: 02/07/2016 11:15 ADDENDUM: These results were called by telephone at the time of interpretation on 02/07/2016 at 11:10 am to Dr. Lavonia Drafts , who verbally acknowledged these results. Electronically Signed   By: Genia Del M.D.   On: 02/07/2016 11:15  02/07/2016  CLINICAL DATA:  70 year old hypertensive female with visual loss greater on the left since this morning. Initial encounter. EXAM: CT HEAD WITHOUT CONTRAST TECHNIQUE: Contiguous axial images were obtained from the base of the skull through the vertex without intravenous contrast. COMPARISON:  11/24/2010. FINDINGS: No intracranial hemorrhage. Remote left frontal lobe, left parietal-occipital lobe, right caudate head region/ anterior limb right internal capsule and posterior right lenticular nucleus infarct. Right parietal -occipital lobe infarct of  indeterminate age. 1 cm calcification anterior right parafalcine region unchanged possibly a small calcified meningioma. Only portion of the orbits are visualized and appear unremarkable. Vascular calcifications. IMPRESSION: No intracranial hemorrhage. Remote left frontal lobe, left parietal-occipital lobe, right caudate head region/ anterior limb right internal capsule and posterior right lenticular nucleus infarcts. Right parietal -occipital lobe infarct of indeterminate age. 1 cm calcification anterior right parafalcine region unchanged possibly a small calcified meningioma. Electronically Signed: By: Genia Del M.D. On: 02/07/2016 11:10   Mr Brain Ltd W/o Cm  02/07/2016  CLINICAL DATA:  Stroke.  Vision loss EXAM: MRI HEAD WITHOUT CONTRAST TECHNIQUE: Multiplanar, multiecho pulse sequences of the brain and surrounding structures were obtained without intravenous contrast. COMPARISON:  CT head 02/07/2016 FINDINGS: The exam was supervised by Dr. Doy Mince, neurohospitalist. Single sequence performed. Axial diffusion imaging only was performed. Acute infarct in the right occipital parietal white matter. Acute infarct in the right posterior cingulate gyrus on the right. Small area of acute infarct in the right parietal convexity. IMPRESSION: Acute infarcts in the right parietal lobe and right posterior temporal parietal lobe in the cingulate gyrus. Examination limited to diffusion imaging only. Electronically Signed   By: Franchot Gallo M.D.   On: 02/07/2016 11:54       Assessment and plan discussed with with attending physician and they are in agreement.    Etta Quill PA-C [facility] [phone]  02/07/2016, 2:32 PM   Assessment: 70 y.o. female presenting with decreased vision which has some inconstancies. tPA has been given with some benefit per patient. She will be admitted under Stroke team and followed by stroke team.   Stroke Risk Factors - diabetes mellitus, hyperlipidemia and  hypertension   Plan: 1. HgbA1c, fasting lipid panel 2. MRI, MRA of the brain without contrast to be completed 3. PT consult, OT consult, Speech consult 4. Echocardiogram 5. Carotid dopplers 6. Prophylactic therapy-None 7. NPO until RN stroke swallow screen 8. Telemetry monitoring 9. Frequent neuro checks 10. Elevated BS addressed  Will be seen by Dr. Silverio Decamp. Please see his attestation note for A/P for any additional work up recommendations.

## 2016-02-07 NOTE — Progress Notes (Signed)
Neurologist paged for orders.

## 2016-02-07 NOTE — ED Notes (Signed)
Patient cannot states name until thinks for minute, states vision same, sees only limited fields.

## 2016-02-08 ENCOUNTER — Inpatient Hospital Stay (HOSPITAL_COMMUNITY): Payer: Commercial Managed Care - HMO

## 2016-02-08 DIAGNOSIS — I6789 Other cerebrovascular disease: Secondary | ICD-10-CM

## 2016-02-08 DIAGNOSIS — H53462 Homonymous bilateral field defects, left side: Secondary | ICD-10-CM

## 2016-02-08 LAB — GLUCOSE, CAPILLARY
GLUCOSE-CAPILLARY: 109 mg/dL — AB (ref 65–99)
GLUCOSE-CAPILLARY: 154 mg/dL — AB (ref 65–99)
GLUCOSE-CAPILLARY: 136 mg/dL — AB (ref 65–99)
Glucose-Capillary: 100 mg/dL — ABNORMAL HIGH (ref 65–99)

## 2016-02-08 LAB — LIPID PANEL
CHOL/HDL RATIO: 4.3 ratio
Cholesterol: 145 mg/dL (ref 0–200)
HDL: 34 mg/dL — AB (ref >40)
LDL Cholesterol: 39 mg/dL (ref 0–99)
Triglycerides: 361 mg/dL — ABNORMAL HIGH (ref <150)
VLDL: 72 mg/dL — AB (ref 0–40)

## 2016-02-08 MED ORDER — ALPRAZOLAM 0.25 MG PO TABS
0.2500 mg | ORAL_TABLET | Freq: Two times a day (BID) | ORAL | Status: DC | PRN
Start: 2016-02-08 — End: 2016-02-09
  Administered 2016-02-08: 0.25 mg via ORAL
  Filled 2016-02-08: qty 1

## 2016-02-08 MED ORDER — CLOPIDOGREL BISULFATE 75 MG PO TABS
75.0000 mg | ORAL_TABLET | Freq: Every day | ORAL | Status: DC
  Administered 2016-02-08: 75 mg via ORAL
  Filled 2016-02-08: qty 1

## 2016-02-08 MED ORDER — PANTOPRAZOLE SODIUM 40 MG PO TBEC
40.0000 mg | DELAYED_RELEASE_TABLET | Freq: Every day | ORAL | Status: DC
  Administered 2016-02-08: 40 mg via ORAL
  Filled 2016-02-08: qty 1

## 2016-02-08 NOTE — Evaluation (Signed)
Speech Language Pathology Evaluation Patient Details Name: Erin Yates MRN: [id number] DOB: 1945-12-29 Today's Date: 02/08/2016 Time: [id number] SLP Time Calculation (min) (ACUTE ONLY): 22 min  Problem List:  Patient Active Problem List   Diagnosis Date Noted  . CVA (cerebral infarction) 02/07/2016   Past Medical History:  Past Medical History  Diagnosis Date  . Hypertension   . PFO (patent foramen ovale)     correction device placed 2010  . Dysrhythmia     prior to PFO correction  . Stroke [facility])     prior to PFO correction  . GERD (gastroesophageal reflux disease)   . Seasonal allergies   . Depression   . Diabetes mellitus without complication ([place])   . Hypercholesteremia   . Hypothyroidism   . Anxiety   . Right leg numbness     lower leg- residual from CVA  . 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 Anemia     past  hx  . Headache     before PFO closure   Past Surgical History:  Past Surgical History  Procedure Laterality Date  . Abdominal hysterectomy    . Nasal sinus surgery    . Patent foramen ovale closure    . Foot neuroma surgery Left   . Cataract extraction w/phaco Right 09/01/2015    Procedure: CATARACT EXTRACTION PHACO AND INTRAOCULAR LENS PLACEMENT (IOC);  Surgeon: Leandrew Koyanagi, MD;  Location: [place];  Service: Ophthalmology;  Laterality: Right;  DIABETIC - oral meds  . Cataract extraction w/phaco Left 10/20/2015    Procedure: CATARACT EXTRACTION PHACO AND INTRAOCULAR LENS PLACEMENT (IOC);  Surgeon: Leandrew Koyanagi, MD;  Location: [address];  Service: Ophthalmology;  Laterality: Left;  DIABETIC - oral meds   HPI:  69 y.o. female with h/o HTN, patent foramen ovale (s/p correction device placement 2010), dysrhythmia, CVA with residual R leg numbness and peripheral vision loss, GERD, depression, DM without complication, hypercholesteremia, hypothyroidism, anxiety and anemia, who presented to [place] ED due to acute onset  of R side headache and loss of vision. MR Brain 4/3 acute infarcts in R parietal lobe and R posterior temporal parietal lobe in cingulate gyrus. No prior h/o SLP intervention located in chart.   Assessment / Plan / Recommendation Clinical Impression  SLP administered Cognistat standardized assessment to evaluate pt's speech, language and cognition. Attention deficits observed throughout evaluation, with memory deficits observed likely due to decreased attention to store new information. Son and daughter report majority of deficits appear baseline, however "memory is a little worse." Pt, son and daughter educated re: results of Cognistat and further f/u by SLP. SLP will continue to follow to provide cognition tx focused on attention and memory.    SLP Assessment  Patient needs continued Speech Lanaguage Pathology Services    Follow Up Recommendations   (TBD)    Frequency and Duration min 2x/week  2 weeks      SLP Evaluation Prior Functioning  Cognitive/Linguistic Baseline: Baseline deficits Baseline deficit details: memory and attention at baseline (5 prior CVAs)  Lives With: Spouse   Cognition  Overall Cognitive Status: History of cognitive impairments - at baseline (family reports memory mildly worse) Arousal/Alertness: Awake/alert Orientation Level: Oriented to person;Oriented to place;Oriented to situation;Oriented to time Attention: Sustained;Focused Focused Attention: Appears intact Sustained Attention: Impaired Sustained Attention Impairment: Verbal basic;Functional basic Memory: Impaired Memory Impairment: Storage deficit;Retrieval deficit;Decreased recall of new information Awareness: Appears intact Problem Solving: Appears intact Executive Function: Sequencing Sequencing:  Appears intact Safety/Judgment: Appears intact    Comprehension  Auditory Comprehension Overall Auditory Comprehension: Appears within functional limits for tasks assessed Yes/No Questions: Within  Functional Limits Commands: Within Functional Limits Conversation: Simple Interfering Components: Attention;Working Field seismologist: Repetition Retail banker: Not tested Reading Comprehension Reading Status: Not tested    Expression Expression Primary Mode of Expression: Verbal Verbal Expression Overall Verbal Expression: Appears within functional limits for tasks assessed Initiation: No impairment Level of Generative/Spontaneous Verbalization: Conversation Repetition: No impairment Naming: No impairment Pragmatics: No impairment Non-Verbal Means of Communication: Not applicable Written Expression Dominant Hand: Right Written Expression: Not tested   Oral / Motor  Oral Motor/Sensory Function Overall Oral Motor/Sensory Function: Within functional limits Motor Speech Overall Motor Speech: Appears within functional limits for tasks assessed Respiration: Within functional limits Phonation: Normal Resonance: Within functional limits Articulation: Within functional limitis Intelligibility: Intelligible Motor Planning: Witnin functional limits Motor Speech Errors: Not applicable    Titus Mould, Student-SLP                      Violetta Lavalle 02/08/2016, 1:27 PM

## 2016-02-08 NOTE — Progress Notes (Signed)
PT Cancellation Note  Patient Details Name: Zanasia Deer MRN: [id number] DOB: 12/18/1945   Cancelled Treatment:    Reason Eval/Treat Not Completed: Patient not medically ready.  Pt currently on bedrest post-tpa.  Will f/u as appropriate.     Catarina Hartshorn, [place] 02/08/2016, 9:18 AM

## 2016-02-08 NOTE — Progress Notes (Signed)
    [facility] has been requested to perform a transesophageal echocardiogram on Erin Yates for stroke.  After careful review of history and examination, the risks and benefits of transesophageal echocardiogram have been explained including risks of esophageal damage, perforation (1:10,000 risk), bleeding, pharyngeal hematoma as well as other potential complications associated with conscious sedation including aspiration, arrhythmia, respiratory failure and death. Alternatives to treatment were discussed, questions were answered. Patient is willing to proceed.  TEE - Dr. Oval Linsey @ 10:00 am . NPO after midnight. Meds with sips.   Erin Selmon, PA-C 02/08/2016 3:02 PM

## 2016-02-08 NOTE — Progress Notes (Signed)
Pt arrived to 5C04 via wheelchair.  Alert and oriented, conversant.  C/o headache 8/10.  Family at bedside.  Telemetry applied and CCMD notified.  Will continue to monitor. Cori Razor, RN

## 2016-02-08 NOTE — Progress Notes (Signed)
STROKE TEAM PROGRESS NOTE   HISTORY OF PRESENT ILLNESS Erin Yates is an 70 y.o. female with a history of CVA and PFO closure who reports waking up normal today. At 0830 02/07/2016 ([facility]) had the acute onset of right sided headache and loss of vision, worse on the left. Patient reports some right peripheral vision loss as a consequence of a previous stroke. Has some numbness below the knee on her right leg as a consequence of a previous stroke as well. Initial NIHSS of 4. Patient on Plavix at home. MRI brain obtained and showed revealed acute small right parietal lobe and posterior temporal lobe infarcts. Tpa was started and patient was transported to [place]: 1.  She was admitted to the neuro ICU for further evaluation and treatment.   SUBJECTIVE (INTERVAL HISTORY) Her husband and son are at the bedside.  Overall she feels her condition is stable. She complains of back pain lying in the bed and would like to get up.   OBJECTIVE Temp:  [97.6 F (36.4 C)-98.7 F (37.1 C)] 98.7 F (37.1 C) (04/04 0800) Pulse Rate:  [46-69] 67 (04/04 0900) Cardiac Rhythm:  [-] Normal sinus rhythm (04/04 0800) Resp:  [11-21] 17 (04/04 0900) BP: (108-166)/(37-106) 164/59 mmHg (04/04 0900) SpO2:  [90 %-100 %] 93 % (04/04 0900) Weight:  [70.4 kg (155 lb 3.3 oz)] 70.4 kg (155 lb 3.3 oz) (04/03 1104)  CBC:  Recent Labs Lab 02/07/16 1104  WBC 7.4  NEUTROABS 4.7  HGB 12.9  HCT 37.7  MCV 93.6  PLT [id number]    Basic Metabolic Panel:  Recent Labs Lab 02/07/16 1104  NA 136  K 4.0  CL 110  CO2 20*  GLUCOSE 231*  BUN 13  CREATININE 0.79  CALCIUM 9.2    Lipid Panel:    Component Value Date/Time   CHOL 145 02/08/2016 0250   TRIG 361* 02/08/2016 0250   HDL 34* 02/08/2016 0250   CHOLHDL 4.3 02/08/2016 0250   VLDL 72* 02/08/2016 0250   LDLCALC 39 02/08/2016 0250   HgbA1c: No results found for: HGBA1C Urine Drug Screen: No results found for: LABOPIA, COCAINSCRNUR, LABBENZ, AMPHETMU, THCU,  LABBARB    IMAGING  Ct Head  02/07/2016   No intracranial hemorrhage. Remote left frontal lobe, left parietal-occipital lobe, right caudate head region/ anterior limb right internal capsule and posterior right lenticular nucleus infarcts. Right parietal -occipital lobe infarct of indeterminate age. 1 cm calcification anterior right parafalcine region unchanged possibly a small calcified meningioma.   Ct Angio Head & Neck  02/07/2016  1. Negative for emergent large vessel occlusion. Positive for right PCA P3 branch occlusion. 2. Stable CT appearance of patchy right PCA ischemia from earlier today. No intracranial hemorrhage or mass effect. 3. Generalized arterial tortuosity. No significant arterial stenosis. Minimal for age atherosclerosis at the arch and in the neck. Mild for age calcified ICA siphon plaque. 4. Emphysema with dependent pulmonary opacity, favor atelectasis.   Mr Brain Ltd W/o Cm 02/07/2016   Acute infarcts in the right parietal lobe and right posterior temporal parietal lobe in the cingulate gyrus. Examination limited to diffusion imaging only.    PHYSICAL EXAM pleasant elderly Caucasian lady currently not in distress. . Afebrile. Head is nontraumatic. Neck is supple without bruit.    Cardiac exam no murmur or gallop. Lungs are clear to auscultation. Distal pulses are well felt. Neurological Exam ; Oriented 3 alert, awake with normal speech and language function. No aphasia, dysarthria or apraxia. Fundi  were not visualized. Pupils are equal reactive. Vision acuity diminished due to limited field of vision. Visual fields show dense left homonymous hemianopsia and mild right temporal field defect. Face is symmetric without weakness. Tongue is midline. Motor system exam revealed symmetric upper and lower extremity strength without focal weakness or drift. Fine finger movements are symmetric. Deep tendon reflexes are symmetric. Plantars are downgoing. Touch pinprick sensation are symmetric  bilaterally. Gait was not tested. ASSESSMENT/PLAN [name] is a 70 y.o. female with history of prior stroke and PFO closure presenting with left homonymous hemianopia. She received IV t-PA at [facility] for 01/23/2013 at 1156 and was shipped to [facility].   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 Right P3 branch occlusion. Emphysema with pulmonary opacity, Faber atelectasis.  2D Echo  pending  TEE to look for embolic source. Arranged with [place] for tomorrow.  If positive for PFO (patent foramen ovale), check bilateral lower extremity venous dopplers to rule out DVT as possible source of stroke. (I have made patient NPO after midnight tonight).  If TEE negative, a [place] electrophysiologist will consult and consider placement of an implantable loop recorder to evaluate for atrial fibrillation as etiology of stroke. This has been explained to patient/family by Dr. Leonie Man and they are agreeable.   LDL 39  HgbA1c pending  SCDs for VTE prophylaxis  Diet Carb Modified Fluid consistency:: Thin; Room service appropriate?: Yes  clopidogrel 75 mg daily prior to admission, now on No antithrombotic as within 24 hours of TPA. Plan to resume Plavix if 24 hour imaging negative for hemorrhage.  Ongoing aggressive stroke risk factor management  Therapy recommendations:  pending   Disposition:  pending   Hypertension  Stable  Hyperlipidemia  Home meds:  Lipitor 80 mg, resumed in hospital  LDL 39, goal < 70  Continue statin at discharge  Diabetes  HgbA1c pending, goal < 7.0  Other Stroke Risk Factors  Advanced age  Cigarette  smoker, advised to stop smoking  Hx stroke/TIA  09/2007 with RLE hp  11/2007 with LLE numbness  01/2008 probable stroke with RUE hp  07/2010 L occipital and R basal ganglia infarct  headaches prior to PFO closure  PFO diagnosed 2009, underwent closure 04/2009 by Dr. Jenne Pane at [facility]  Other Active Problems  Seasonal allergies on Flonase at home  GERD on Protonix  Depression, on Effexor  Insomnia, Ambien when necessary at home  Hospital day # [place] for Pager information 02/08/2016 11:14 AM  I have personally examined this patient, reviewed notes, independently viewed imaging studies, participated in medical decision making and plan of care. I have made any additions or clarifications directly to the above note. Agree with note above. She presented with sudden onset of blurred vision bilaterally and subsequently noticed mainly left-sided vision loss. MRI scan shows a right parietal and medial temporal and occipital infarcts in etiology to be determined. She remains at risk for neurological worsening, recurrent stroke, TIA. She received IV tPA and needs close neurological monitoring and tight blood pressure control. She will need TEE and loop recorder. I had a long  discussion with the patient and daughter and husband at the bedside and answered questions. This patient is critically ill and at significant risk of neurological worsening, death and care requires constant monitoring of vital signs, hemodynamics,respiratory and cardiac monitoring, extensive review of multiple databases, frequent neurological assessment, discussion with family, other specialists and medical decision making of high complexity.I have made any additions or clarifications directly to the above note.This critical care time does not reflect procedure time, or teaching time or supervisory time of PA/NP/Med Resident etc but could involve care discussion time.  I spent 30  minutes of neurocritical care time  in the care of  this patient.     Antony Contras, MD Medical Director [place] Pager: [phone] 02/08/2016 3:23 PM    To contact Stroke Continuity provider, please refer to [email or web address] After hours, contact General Neurology\

## 2016-02-08 NOTE — Progress Notes (Signed)
Pt experienced significant anxiety & claustrophobia in MRI (unlike her previous MRIs).  Scans interrupted x2 d/t anxiety & panic; RN stayed w/ pt in scanner room for support & pt completed most (not all) MRI sequences.  Note that she take alprazolam 0.25mg  po bid prn for anxiety at home.  Tyrone Nine, NP made aware.

## 2016-02-08 NOTE — Care Management Note (Signed)
Case Management Note  Patient Details  Name: Erin Yates MRN: [id number] Date of Birth: 03/20/1946  Subjective/Objective:     Pt admitted on 02/07/16 s/p CVA with TPA given.  PTA, pt independent, lives with spouse.                Action/Plan: PT/OT ordered and to see later today.  Met with husband and son; they will be available to provide 24hr care at dc.  Will follow for discharge planning as pt progresses.    Expected Discharge Date:  02/11/16               Expected Discharge Plan:  Home w Home Health Services  In-House Referral:     Discharge planning Services  CM Consult  Post Acute Care Choice:    Choice offered to:     DME Arranged:    DME Agency:     HH Arranged:    HH Agency:     Status of Service:  In process, will continue to follow  Medicare Important Message Given:    Date Medicare IM Given:    Medicare IM give by:    Date Additional Medicare IM Given:    Additional Medicare Important Message give by:     If discussed at Long Length of Stay Meetings, dates discussed:    Additional Comments:   W. , RN, BSN  Trauma/Neuro ICU Case Manager [phone] 

## 2016-02-08 NOTE — Progress Notes (Signed)
PT Cancellation Note  Patient Details Name: Erin Yates MRN: [id number] DOB: [birth year]   Cancelled Treatment:    Reason Eval/Treat Not Completed: Patient at procedure or test/unavailable.  Pt currently at MRI.  Will check another time.     Tylek Boney, Thornton Papas 02/08/2016, 1:25 PM

## 2016-02-08 NOTE — Progress Notes (Signed)
Echocardiogram 2D Echocardiogram has been performed.  Tresa Res 02/08/2016, 3:18 PM

## 2016-02-09 ENCOUNTER — Inpatient Hospital Stay (HOSPITAL_COMMUNITY): Payer: Commercial Managed Care - HMO

## 2016-02-09 ENCOUNTER — Encounter (HOSPITAL_COMMUNITY): Payer: Self-pay | Admitting: *Deleted

## 2016-02-09 ENCOUNTER — Encounter (HOSPITAL_COMMUNITY)
Admission: AD | Disposition: A | Discharge status: Home / Self Care | Payer: Self-pay | Source: Other Acute Inpatient Hospital | Attending: Neurology

## 2016-02-09 ENCOUNTER — Other Ambulatory Visit: Payer: Self-pay | Admitting: Physician Assistant

## 2016-02-09 DIAGNOSIS — I63039 Cerebral infarction due to thrombosis of unspecified carotid artery: Secondary | ICD-10-CM

## 2016-02-09 DIAGNOSIS — I639 Cerebral infarction, unspecified: Secondary | ICD-10-CM | POA: Insufficient documentation

## 2016-02-09 DIAGNOSIS — I63419 Cerebral infarction due to embolism of unspecified middle cerebral artery: Secondary | ICD-10-CM

## 2016-02-09 HISTORY — PX: TEE WITHOUT CARDIOVERSION: SHX5443

## 2016-02-09 LAB — GLUCOSE, CAPILLARY
GLUCOSE-CAPILLARY: 105 mg/dL — AB (ref 65–99)
GLUCOSE-CAPILLARY: 137 mg/dL — AB (ref 65–99)

## 2016-02-09 LAB — ECHOCARDIOGRAM COMPLETE: Weight: 2483.26 oz

## 2016-02-09 LAB — HEMOGLOBIN A1C
Hgb A1c MFr Bld: 6.4 % — ABNORMAL HIGH (ref 4.8–5.6)
MEAN PLASMA GLUCOSE: 137 mg/dL

## 2016-02-09 SURGERY — ECHOCARDIOGRAM, TRANSESOPHAGEAL
Anesthesia: Moderate Sedation

## 2016-02-09 MED ORDER — FENTANYL CITRATE (PF) 100 MCG/2ML IJ SOLN
INTRAMUSCULAR | Status: AC
  Filled 2016-02-09: qty 2

## 2016-02-09 MED ORDER — MIDAZOLAM HCL 10 MG/2ML IJ SOLN
INTRAMUSCULAR | Status: DC | PRN
  Administered 2016-02-09 (×2): 1 mg via INTRAVENOUS
  Administered 2016-02-09: 2 mg via INTRAVENOUS

## 2016-02-09 MED ORDER — ASPIRIN EC 325 MG PO TBEC
325.0000 mg | DELAYED_RELEASE_TABLET | Freq: Every day | ORAL | Status: DC
  Administered 2016-02-09: 325 mg via ORAL
  Filled 2016-02-09: qty 1

## 2016-02-09 MED ORDER — MIDAZOLAM HCL 5 MG/ML IJ SOLN
INTRAMUSCULAR | Status: AC
  Filled 2016-02-09: qty 2

## 2016-02-09 MED ORDER — SODIUM CHLORIDE 0.9 % IV SOLN
INTRAVENOUS | Status: DC
  Administered 2016-02-09: 500 mL via INTRAVENOUS

## 2016-02-09 MED ORDER — DIPHENHYDRAMINE HCL 50 MG/ML IJ SOLN
INTRAMUSCULAR | Status: DC | PRN
  Administered 2016-02-09: 25 mg via INTRAVENOUS

## 2016-02-09 MED ORDER — BUTAMBEN-TETRACAINE-BENZOCAINE 2-2-14 % EX AERO
INHALATION_SPRAY | CUTANEOUS | Status: DC | PRN
  Administered 2016-02-09: 2 via TOPICAL
  Administered 2016-02-09: 1 via TOPICAL

## 2016-02-09 MED ORDER — ASPIRIN 325 MG PO TBEC: 325.0000 mg | DELAYED_RELEASE_TABLET | Freq: Every day | ORAL | Status: DC

## 2016-02-09 MED ORDER — FENTANYL CITRATE (PF) 100 MCG/2ML IJ SOLN
INTRAMUSCULAR | Status: DC | PRN
  Administered 2016-02-09 (×2): 25 ug via INTRAVENOUS

## 2016-02-09 MED ORDER — DIPHENHYDRAMINE HCL 50 MG/ML IJ SOLN
INTRAMUSCULAR | Status: AC
  Filled 2016-02-09: qty 1

## 2016-02-09 NOTE — Consult Note (Signed)
ELECTROPHYSIOLOGY CONSULT NOTE  Patient ID: Erin Yates MRN: [id number], DOB/AGE: 70-Apr-1947   Admit date: 02/07/2016 Date of Consult: 02/09/2016  Primary Physician: Adin Hector, MD Primary Cardiologist: Dr. Nehemiah Massed (and [place])  though her last visit was 2014 Reason for Consultation: Cryptogenic stroke; recommendations regarding Implantable Loop Recorder  History of Present Illness Erin Yates was admitted on 02/07/2016 with acute CVA.  They first developed symptoms while at home, visual changes and headache, and ongoing chronic RLE numbness from a previous stroke.  Imaging demonstrated right parietal and posterior temporal parietal lobe infarcts embolic secondary to unknown source.  she has undergone workup for stroke including echocardiogram and carotid angio.  The patient has been monitored on telemetry which has demonstrated sinus rhythm with no arrhythmias.  Inpatient stroke work-up is to be completed with a TEE.   Patient has PMHx of PFO closure, prior strokes (x6), HTN, ?dysrhythmia, HLD, hypothyroidism. The patient and her daughter state she has been told of an irregular heart beat for many years, but specific diagnosis has never been mentioned.  The patient states prior to her 1st stroke she had been given prednisone for something and her heart beat was "all over the place" jumping around, fast and skipping beats, and then sufferred a stroke.  She had 3 more strokes then a PFO closure in 2010, unfortunately now 2 strokes since then.  She has some palpitations and for years ann "irreguar heart beat".  She was last to her cardiologist office in 2014, a note from then does not mention any known arrhythmia and her last EKG there was SR  Echocardiogram this admission demonstrated  Study Conclusions - Left ventricle: The cavity size was normal. Systolic function was  normal. The estimated ejection fraction was in the range of 55%  to 60%. Wall motion was normal; there were no  regional wall  motion abnormalities. Left ventricular diastolic function  parameters were normal. - Mitral valve: Calcified annulus. Lab work is reviewed.  Prior to admission, the patient denies chest pain, shortness of breath, dizziness, or syncope.  They are recovering from their stroke with disposition pending.  EP has been asked to evaluate for placement of an implantable loop recorder to monitor for atrial fibrillation.     Past Medical History  Diagnosis Date  . Hypertension   . PFO (patent foramen ovale)     correction device placed 2010  . Dysrhythmia     prior to PFO correction  . Stroke [facility])     prior to PFO correction  . GERD (gastroesophageal reflux disease)   . Seasonal allergies   . Depression   . Diabetes mellitus without complication ([place])   . Hypercholesteremia   . Hypothyroidism   . Anxiety   . Right leg numbness     lower leg- residual from CVA  . 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 Anemia     past  hx  . Headache     before PFO closure     Surgical History:  Past Surgical History  Procedure Laterality Date  . Abdominal hysterectomy    . Nasal sinus surgery    . Patent foramen ovale closure    . Foot neuroma surgery Left   . Cataract extraction w/phaco Right 09/01/2015    Procedure: CATARACT EXTRACTION PHACO AND INTRAOCULAR LENS PLACEMENT (IOC);  Surgeon: Leandrew Koyanagi, MD;  Location: [facility];  Service: Ophthalmology;  Laterality: Right;  DIABETIC - oral meds  . Cataract extraction w/phaco Left 10/20/2015    Procedure: CATARACT EXTRACTION PHACO AND INTRAOCULAR LENS PLACEMENT (IOC);  Surgeon: Leandrew Koyanagi, MD;  Location: [facility];  Service: Ophthalmology;  Laterality: Left;  DIABETIC - oral meds     Prescriptions prior to admission  Medication Sig Dispense Refill Last Dose  . ALPRAZolam (XANAX) 0.25 MG tablet Take 0.25 mg by mouth 2 (two) times daily as needed for anxiety.   02/06/2016 at Unknown  time  . amLODipine (NORVASC) 10 MG tablet Take 10 mg by mouth daily. PM   02/07/2016 at Unknown time  . atorvastatin (LIPITOR) 80 MG tablet Take 80 mg by mouth daily. PM   02/06/2016 at Unknown time  . buPROPion (WELLBUTRIN SR) 100 MG 12 hr tablet Take 100 mg by mouth 2 (two) times daily.   02/07/2016 at Unknown time  . clopidogrel (PLAVIX) 75 MG tablet Take 75 mg by mouth daily. AM   02/07/2016 at 8a  . ergocalciferol (VITAMIN D2) 50000 UNITS capsule Take 50,000 Units by mouth once a week.   Past Week at Unknown time  . fluticasone (FLONASE) 50 MCG/ACT nasal spray Place into both nostrils daily as needed for allergies or rhinitis.   Past Month at Unknown time  . levothyroxine (SYNTHROID, LEVOTHROID) 50 MCG tablet Take 50 mcg by mouth daily before breakfast.   02/07/2016 at Unknown time  . losartan (COZAAR) 100 MG tablet Take 100 mg by mouth daily. AM   02/06/2016 at Unknown time  . metFORMIN (GLUCOPHAGE) 1000 MG tablet Take 1,000 mg by mouth 2 (two) times daily with a meal.   02/07/2016 at Unknown time  . metoprolol (TOPROL-XL) 200 MG 24 hr tablet Take 200 mg by mouth daily. AM   02/07/2016 at 8a  . pantoprazole (PROTONIX) 40 MG tablet Take 40 mg by mouth 2 (two) times daily.   02/07/2016 at Unknown time  . venlafaxine XR (EFFEXOR-XR) 150 MG 24 hr capsule Take 150 mg by mouth daily with breakfast.   02/06/2016 at Unknown time  . vitamin B-12 (CYANOCOBALAMIN) 500 MCG tablet Take 500 mcg by mouth daily. AM   02/06/2016 at Unknown time  . zolpidem (AMBIEN) 5 MG tablet Take 5 mg by mouth at bedtime as needed for sleep.   02/06/2016 at Unknown time    Inpatient Medications:  . [MAR Hold]  stroke: mapping our early stages of recovery book   Does not apply Once  . [MAR Hold] amLODipine  10 mg Oral Daily  . [MAR Hold] atorvastatin  80 mg Oral q1800  . [MAR Hold] clopidogrel  75 mg Oral Daily  . [MAR Hold] levothyroxine  50 mcg Oral QAC breakfast  . [MAR Hold] losartan  100 mg Oral Daily  . [MAR Hold] metFORMIN  1,000 mg  Oral BID WC  . [MAR Hold] metoprolol  200 mg Oral Daily  . [MAR Hold]  morphine injection  1 mg Intravenous Once  . [MAR Hold] pantoprazole  40 mg Oral QHS    Allergies:  Allergies  Allergen Reactions  . Penicillins Rash    Also swelling and itching    Social History   Social History  . Marital Status: Married    Spouse Name: N/A  . Number of Children: N/A  . Years of Education: N/A   Occupational History  . Not on file.   Social History Main Topics  . Smoking status: Current Every Day Smoker -- 0.50 packs/day for 25 years  .  Smokeless tobacco: Not on file  . Alcohol Use: No  . Drug Use: Not on file  . Sexual Activity: Not on file   Other Topics Concern  . Not on file   Social History Narrative     Family History  Problem Relation Age of Onset  . Breast cancer Sister 78      Review of Systems: All other systems reviewed and are otherwise negative except as noted above.  Physical Exam: Filed Vitals:   02/08/16 2127 02/09/16 0141 02/09/16 0703 02/09/16 0857  BP: 168/64 148/77 160/74 168/57  Pulse: 63 64 61   Temp: 98.5 F (36.9 C) 98.3 F (36.8 C) 98.4 F (36.9 C) 98.5 F (36.9 C)  TempSrc:  Oral Oral Oral  Resp: 16 16 16 14   SpO2: 94% 93% 91% 96%     GEN- The patient is well appearing, alert and oriented x 3 today.   Head- normocephalic, atraumatic Eyes-  Sclera clear, conjunctiva pink Ears- hearing intact Oropharynx- clear Neck- supple Lungs- Clear to ausculation bilaterally, normal work of breathing Heart- Regular rate and rhythm, no murmurs, rubs or gallops  GI- soft, NT, ND Extremities- no clubbing, cyanosis, or edema MS- no significant deformity or atrophy Skin- no rash or lesion Psych- euthymic mood, full affect   Labs:   Lab Results  Component Value Date   WBC 7.4 02/07/2016   HGB 12.9 02/07/2016   HCT 37.7 02/07/2016   MCV 93.6 02/07/2016   PLT 276 02/07/2016    Recent Labs Lab 02/07/16 1104  NA 136  K 4.0  CL 110    CO2 20*  BUN 13  CREATININE 0.79  CALCIUM 9.2  PROT 7.1  BILITOT 0.3  ALKPHOS 94  ALT 14  AST 18  GLUCOSE 231*   Lab Results  Component Value Date   TROPONINI <0.03 02/07/2016   Lab Results  Component Value Date   CHOL 145 02/08/2016   Lab Results  Component Value Date   HDL 34* 02/08/2016   Lab Results  Component Value Date   LDLCALC 39 02/08/2016   Lab Results  Component Value Date   TRIG 361* 02/08/2016   Lab Results  Component Value Date   CHOLHDL 4.3 02/08/2016   No results found for: LDLDIRECT  No results found for: DDIMER   Radiology/Studies:  Ct Angio Head W/cm &/or Wo Cm 02/07/2016  CLINICAL DATA:  70 year old female status post IV tPA at noon today following presentation of severe headache, loss of speech, confusion. Acute posterior right hemisphere infarcts on diffusion MRI at that time. Initial encounter. EXAM: CT ANGIOGRAPHY HEAD AND NECK TECHNIQUE: Multidetector CT imaging of the head and neck was performed using the standard protocol during bolus administration of intravenous contrast. Multiplanar CT image reconstructions and MIPs were obtained to evaluate the vascular anatomy. Carotid stenosis measurements (when applicable) are obtained utilizing NASCET criteria, using the distal internal carotid diameter as the denominator. CONTRAST:  50 mL Isovue 370 COMPARISON:  Noncontrast head CT 1055 hours today. FINDINGS: CTA NECK Skeleton: Partially visible thoracic scoliosis. No acute osseous abnormality identified. Previous paranasal sinus surgery on the left.  Sinuses are clear. Other neck: Emphysema with nonspecific dependent opacity in both upper lobes. No superior mediastinal lymphadenopathy. Thyroid, larynx (glottis closed), pharynx, parapharyngeal spaces, retropharyngeal space, sublingual space, submandibular glands, and parotid glands are within normal limits. No cervical lymphadenopathy. Aortic arch: Bovine arch configuration. Minimal calcified arch  atherosclerosis. Right carotid system: Tortuous proximal right CCA. Negative right carotid bifurcation. Minimal  cervical right ICA calcified plaque. Left carotid system: Bovine arch configuration. Tortuous proximal left CCA with a kinked appearance (series 8, image 123). Minimal left CCA and left carotid bifurcation atherosclerosis with no stenosis. Mildly tortuous cervical left ICA. Vertebral arteries: No proximal right subclavian artery stenosis. Normal right vertebral artery origin. Negative cervical right vertebral artery. No proximal left subclavian artery stenosis. Normal left vertebral artery origin despite nearby calcified plaque (series 8, image 132). Tortuous left V1 segment. Calcified plaque in the proximal left V2 segment without significant stenosis (series 8, image 129). Otherwise negative left vertebral artery to the skullbase. CTA HEAD Posterior circulation: No distal vertebral artery stenosis. Mild generalized distal vertebral and vertebrobasilar dolichoectasia. Normal right PICA origin. Dominant appearing left AICA origin. Tortuous basilar artery without stenosis. Normal SCA and PCA origins. Posterior communicating arteries are diminutive or absent. The left PCA branches are within normal limits. There does appear to be occlusion of a right lateral PCA P3 branch best seen on series 10, image 19, also series 8, image 97. Anterior circulation: Tortuous ICA siphons with mild calcified plaque an no siphon stenosis. The left siphon appears mildly dominant owing to dominant left ACA A1 segment, right A1 is diminutive. Patent carotid termini. Normal MCA and left ACA origins. Anterior communicating artery and bilateral ACA branches are within normal limits. Left MCA M1 segment, bifurcation, and left MCA branches are within normal limits. Right MCA M1 segment, bifurcation, and right MCA branches are within normal limits. Venous sinuses: Patent. Anatomic variants: Dominant left ACA A1 segment. Bovine type  arch configuration. Delayed phase: No abnormal enhancement identified. Chronic encephalomalacia in the left MCA and PCA territories. Patchy right PCA territory hypodensity corresponding to acute ischemia re- demonstrated and appears stable by CT. Chronic appearing lacunar infarct right caudate. No acute intracranial hemorrhage identified. No midline shift, mass effect, or evidence of intracranial mass lesion. IMPRESSION: 1. Negative for emergent large vessel occlusion. Positive for right PCA P3 branch occlusion. 2. Stable CT appearance of patchy right PCA ischemia from earlier today. No intracranial hemorrhage or mass effect. 3. Generalized arterial tortuosity. No significant arterial stenosis. Minimal for age atherosclerosis at the arch and in the neck. Mild for age calcified ICA siphon plaque. 4. Emphysema with dependent pulmonary opacity, favor atelectasis. Electronically Signed   By: Genevie Ann M.D.   On: 02/07/2016 20:13   Ct Head Wo Contrast  02/07/2016  ADDENDUM REPORT: 02/07/2016 11:15 ADDENDUM: These results were called by telephone at the time of interpretation on 02/07/2016 at 11:10 am to Dr. Lavonia Drafts , who verbally acknowledged these results. Electronically Signed   By: Genia Del M.D.   On: 02/07/2016 11:15  02/07/2016  CLINICAL DATA:  70 year old hypertensive female with visual loss greater on the left since this morning. Initial encounter. EXAM: CT HEAD WITHOUT CONTRAST TECHNIQUE: Contiguous axial images were obtained from the base of the skull through the vertex without intravenous contrast. COMPARISON:  11/24/2010. FINDINGS: No intracranial hemorrhage. Remote left frontal lobe, left parietal-occipital lobe, right caudate head region/ anterior limb right internal capsule and posterior right lenticular nucleus infarct. Right parietal -occipital lobe infarct of indeterminate age. 1 cm calcification anterior right parafalcine region unchanged possibly a small calcified meningioma. Only portion of  the orbits are visualized and appear unremarkable. Vascular calcifications. IMPRESSION: No intracranial hemorrhage. Remote left frontal lobe, left parietal-occipital lobe, right caudate head region/ anterior limb right internal capsule and posterior right lenticular nucleus infarcts. Right parietal -occipital lobe infarct of indeterminate age. 1 cm  calcification anterior right parafalcine region unchanged possibly a small calcified meningioma. Electronically Signed: By: Genia Del M.D. On: 02/07/2016 11:10   Ct Angio Neck W/cm &/or Wo/cm 02/07/2016  CLINICAL DATA:  70 year old female status post IV tPA at noon today following presentation of severe headache, loss of speech, confusion. Acute posterior right hemisphere infarcts on diffusion MRI at that time. Initial encounter. EXAM: CT ANGIOGRAPHY HEAD AND NECK TECHNIQUE: Multidetector CT imaging of the head and neck was performed using the standard protocol during bolus administration of intravenous contrast. Multiplanar CT image reconstructions and MIPs were obtained to evaluate the vascular anatomy. Carotid stenosis measurements (when applicable) are obtained utilizing NASCET criteria, using the distal internal carotid diameter as the denominator. CONTRAST:  50 mL Isovue 370 COMPARISON:  Noncontrast head CT 1055 hours today. FINDINGS: CTA NECK Skeleton: Partially visible thoracic scoliosis. No acute osseous abnormality identified. Previous paranasal sinus surgery on the left.  Sinuses are clear. Other neck: Emphysema with nonspecific dependent opacity in both upper lobes. No superior mediastinal lymphadenopathy. Thyroid, larynx (glottis closed), pharynx, parapharyngeal spaces, retropharyngeal space, sublingual space, submandibular glands, and parotid glands are within normal limits. No cervical lymphadenopathy. Aortic arch: Bovine arch configuration. Minimal calcified arch atherosclerosis. Right carotid system: Tortuous proximal right CCA. Negative right  carotid bifurcation. Minimal cervical right ICA calcified plaque. Left carotid system: Bovine arch configuration. Tortuous proximal left CCA with a kinked appearance (series 8, image 123). Minimal left CCA and left carotid bifurcation atherosclerosis with no stenosis. Mildly tortuous cervical left ICA. Vertebral arteries: No proximal right subclavian artery stenosis. Normal right vertebral artery origin. Negative cervical right vertebral artery. No proximal left subclavian artery stenosis. Normal left vertebral artery origin despite nearby calcified plaque (series 8, image 132). Tortuous left V1 segment. Calcified plaque in the proximal left V2 segment without significant stenosis (series 8, image 129). Otherwise negative left vertebral artery to the skullbase. CTA HEAD Posterior circulation: No distal vertebral artery stenosis. Mild generalized distal vertebral and vertebrobasilar dolichoectasia. Normal right PICA origin. Dominant appearing left AICA origin. Tortuous basilar artery without stenosis. Normal SCA and PCA origins. Posterior communicating arteries are diminutive or absent. The left PCA branches are within normal limits. There does appear to be occlusion of a right lateral PCA P3 branch best seen on series 10, image 19, also series 8, image 97. Anterior circulation: Tortuous ICA siphons with mild calcified plaque an no siphon stenosis. The left siphon appears mildly dominant owing to dominant left ACA A1 segment, right A1 is diminutive. Patent carotid termini. Normal MCA and left ACA origins. Anterior communicating artery and bilateral ACA branches are within normal limits. Left MCA M1 segment, bifurcation, and left MCA branches are within normal limits. Right MCA M1 segment, bifurcation, and right MCA branches are within normal limits. Venous sinuses: Patent. Anatomic variants: Dominant left ACA A1 segment. Bovine type arch configuration. Delayed phase: No abnormal enhancement identified. Chronic  encephalomalacia in the left MCA and PCA territories. Patchy right PCA territory hypodensity corresponding to acute ischemia re- demonstrated and appears stable by CT. Chronic appearing lacunar infarct right caudate. No acute intracranial hemorrhage identified. No midline shift, mass effect, or evidence of intracranial mass lesion. IMPRESSION: 1. Negative for emergent large vessel occlusion. Positive for right PCA P3 branch occlusion. 2. Stable CT appearance of patchy right PCA ischemia from earlier today. No intracranial hemorrhage or mass effect. 3. Generalized arterial tortuosity. No significant arterial stenosis. Minimal for age atherosclerosis at the arch and in the neck. Mild for age calcified  ICA siphon plaque. 4. Emphysema with dependent pulmonary opacity, favor atelectasis. Electronically Signed   By: Genevie Ann M.D.   On: 02/07/2016 20:13   Mr Brain Wo Contrast 02/08/2016  CLINICAL DATA:  Stroke.  TPA administered yesterday. EXAM: MRI HEAD WITHOUT CONTRAST TECHNIQUE: Multiplanar, multiecho pulse sequences of the brain and surrounding structures were obtained without intravenous contrast. COMPARISON:  MRI 02/07/2016 FINDINGS: Patient not able to complete the study. Axial T2, FLAIR, and diffusion imaging performed with mild motion. The patient refused further imaging. Acute infarct in the right occipital lobe has progressed since yesterday. There is involvement of the medial anterior occipital lobe which is unchanged. Progression of infarct in the posterior right occipital lobe. Small area of acute infarct in the high right parietal cortex unchanged. No other areas of acute infarct Multiple areas of chronic infarction including the left occipital lobe, left parietal lobe in 2 areas. Chronic ischemic changes in the white matter and pons. Small chronic infarct left cerebellum. Negative for mass lesion. Ventricle size normal.  No shift of the midline structures. IMPRESSION: Acute infarct in the right occipital  lobe compatible with acute PCA infarction which has progressed since yesterday. Small area of acute infarct in the high right parietal cortex unchanged. Multiple areas of chronic cortical ischemia . This may be due to chronic cerebral emboli. Electronically Signed   By: Franchot Gallo M.D.   On: 02/08/2016 13:44   Mr Brain Ltd W/o Cm 02/07/2016  CLINICAL DATA:  Stroke.  Vision loss EXAM: MRI HEAD WITHOUT CONTRAST TECHNIQUE: Multiplanar, multiecho pulse sequences of the brain and surrounding structures were obtained without intravenous contrast. COMPARISON:  CT head 02/07/2016 FINDINGS: The exam was supervised by Dr. Doy Mince, neurohospitalist. Single sequence performed. Axial diffusion imaging only was performed. Acute infarct in the right occipital parietal white matter. Acute infarct in the right posterior cingulate gyrus on the right. Small area of acute infarct in the right parietal convexity. IMPRESSION: Acute infarcts in the right parietal lobe and right posterior temporal parietal lobe in the cingulate gyrus. Examination limited to diffusion imaging only. Electronically Signed   By: Franchot Gallo M.D.   On: 02/07/2016 11:54    12-lead ECG SR All prior EKG's in EPIC reviewed with no documented atrial fibrillation  Telemetry SR, APCs are frequent at times, grouped 3-4 in a row  Assessment and Plan:  1. Cryptogenic stroke The patient presents with cryptogenic stroke.  The patient has a TEE planned for this AM.  Dr. Curt Bears and myself spoke at length with the patient about monitoring for afib with either a 30 day event monitor or an implantable loop recorder.  Risks, benefits, and alteratives to implantable loop recorder were discussed with the patient today.   At this time, suspect she has PAFib with longstanding palpitations and reported history above, we Jaisen Wiltrout first plan for 30day event monitor and EP f/u, if no PAF is noted can revisit ILR  Please call with questions.   Baldwin Jamaica,  PA-C 02/09/2016  I have seen and examined this patient with Tommye Standard.  Agree with above, note added to reflect my findings.  On exam, regular rhythm, no murmurs, lungs clear.  Patient has had multiple strokes after having a PFO closure.  Has had palpitations and some sort of arrhythmia in the past.  She has been having APCs in the hospital without AF.  Due to that, it is likely that a 30 day monitor would be most appropriate for her.  If that  is not positive, may benefit from a LINQ in the future.    Knoxx Boeding M. Yanky Vanderburg MD 02/09/2016 2:54 PM

## 2016-02-09 NOTE — Interval H&P Note (Signed)
History and Physical Interval Note:  02/09/2016 10:07 AM  Erin Yates  has presented today for surgery, with the diagnosis of stroke  The various methods of treatment have been discussed with the patient and family. After consideration of risks, benefits and other options for treatment, the patient has consented to  Procedure(s): TRANSESOPHAGEAL ECHOCARDIOGRAM (TEE) (N/A) as a surgical intervention .  The patient's history has been reviewed, patient examined, no change in status, stable for surgery.  I have reviewed the patient's chart and labs.  Questions were answered to the patient's satisfaction.     Adrian Prows

## 2016-02-09 NOTE — Progress Notes (Signed)
Patient is being d/c home. Dc instructions given and patient and spouse verbalized understanding. Patient left with all her belongings

## 2016-02-09 NOTE — Progress Notes (Signed)
STROKE TEAM PROGRESS NOTE   SUBJECTIVE (INTERVAL HISTORY) Patient just back from TEE - family at bedside. Spoke with Ganji - PFO closure device in place with no other OSE seen. EP has seen and recommends 30d monitor. Dr. Einar Gip and [facility] to discuss who will do.   OBJECTIVE Temp:  [97.9 F (36.6 C)-98.5 F (36.9 C)] 98.5 F (36.9 C) (04/05 0857) Pulse Rate:  [54-90] 67 (04/05 1040) Cardiac Rhythm:  [-] Normal sinus rhythm (04/05 0800) Resp:  [11-24] 17 (04/05 1040) BP: (138-194)/(57-122) 150/59 mmHg (04/05 1040) SpO2:  [91 %-98 %] 93 % (04/05 1040)  CBC:   Recent Labs Lab 02/07/16 1104  WBC 7.4  NEUTROABS 4.7  HGB 12.9  HCT 37.7  MCV 93.6  PLT [id number]    Basic Metabolic Panel:   Recent Labs Lab 02/07/16 1104  NA 136  K 4.0  CL 110  CO2 20*  GLUCOSE 231*  BUN 13  CREATININE 0.79  CALCIUM 9.2    Lipid Panel:     Component Value Date/Time   CHOL 145 02/08/2016 0250   TRIG 361* 02/08/2016 0250   HDL 34* 02/08/2016 0250   CHOLHDL 4.3 02/08/2016 0250   VLDL 72* 02/08/2016 0250   LDLCALC 39 02/08/2016 0250   HgbA1c:  Lab Results  Component Value Date   HGBA1C 6.4* 02/08/2016   Urine Drug Screen: No results found for: LABOPIA, COCAINSCRNUR, LABBENZ, AMPHETMU, THCU, LABBARB    IMAGING  Ct Head  02/07/2016   No intracranial hemorrhage. Remote left frontal lobe, left parietal-occipital lobe, right caudate head region/ anterior limb right internal capsule and posterior right lenticular nucleus infarcts. Right parietal -occipital lobe infarct of indeterminate age. 1 cm calcification anterior right parafalcine region unchanged possibly a small calcified meningioma.   Ct Angio Head & Neck  02/07/2016  1. Negative for emergent large vessel occlusion. Positive for right PCA P3 branch occlusion. 2. Stable CT appearance of patchy right PCA ischemia from earlier today. No intracranial hemorrhage or mass effect. 3. Generalized arterial tortuosity. No significant arterial  stenosis. Minimal for age atherosclerosis at the arch and in the neck. Mild for age calcified ICA siphon plaque. 4. Emphysema with dependent pulmonary opacity, favor atelectasis.   Mr Brain Ltd W/o Cm 02/07/2016   Acute infarcts in the right parietal lobe and right posterior temporal parietal lobe in the cingulate gyrus. Examination limited to diffusion imaging only.   2D Echocardiogram  - Left ventricle: The cavity size was normal. Systolic function was normal. The estimated ejection fraction was in the range of 55% to 60%. Wall motion was normal; there were no regional wall motion abnormalities. Left ventricular diastolic function parameters were normal. - Mitral valve: Calcified annulus.  TEE PFO closure device in place, no other source of embolus seen   PHYSICAL EXAM pleasant elderly Caucasian lady currently not in distress. . Afebrile. Head is nontraumatic. Neck is supple without bruit.    Cardiac exam no murmur or gallop. Lungs are clear to auscultation. Distal pulses are well felt. Neurological Exam ; Oriented 3 alert, awake with normal speech and language function. No aphasia, dysarthria or apraxia. Fundi were not visualized. Pupils are equal reactive. Vision acuity diminished due to limited field of vision. Visual fields show dense left homonymous hemianopsia and mild right temporal field defect. Face is symmetric without weakness. Tongue is midline. Motor system exam revealed symmetric upper and lower extremity strength without focal weakness or drift. Fine finger movements are symmetric. Deep tendon reflexes are symmetric. Plantars  are downgoing. Touch pinprick sensation are symmetric bilaterally. Gait was not tested.   ASSESSMENT/PLAN [name] is a 70 y.o. female with history of prior stroke and PFO closure presenting with left homonymous hemianopia. She received IV t-PA at [facility] for 01/23/2013 at 1156 and was shipped to [facility]   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 Right P3 branch occlusion. Emphysema with pulmonary opacity, Faber atelectasis.  2D Echo  EF 60-65%, no SOE  TEE PFO closure device in place, no SOE  30 day monitor planned, if neg, will revisit implantable loop recorder - Gan[facility] or [facility] to follow up. They are going to discuss and decide amongst themselves.  LDL 39  HgbA1c 6.4  SCDs for VTE prophylaxis Diet heart healthy/carb modified Room service appropriate?: Yes; Fluid consistency:: Thin  clopidogrel 75 mg daily prior to admission, now on clopidogrel 75 mg daily. Given stroke on plavix, will change to aspirin 325 mg daily. Dr Erin Yates discussed with DR. Ganji.   Ongoing aggressive stroke risk factor management  Consider stroke research study - Dr. Leonie Yates will followup with pt.  Therapy recommendations:  No OT, PT has not yet seen  Disposition:  Return home  Anticipate discharge later this afternoon  Hypertension  Stable  Hyperlipidemia  Home meds:  Lipitor 80 mg, resumed in hospital  LDL 39, goal < 70  Continue statin at discharge  Diabetes  HgbA1c 6.4, goal < 7.0  Other Stroke Risk Factors  Advanced age  Cigarette smoker, advised to stop smoking  Hx stroke/TIA  09/2007 with RLE hp  11/2007 with LLE numbness  01/2008 probable stroke with RUE hp  07/2010 L occipital and R basal ganglia infarct  headaches prior to PFO closure  PFO diagnosed 2009, underwent closure 04/2009 by Dr. Jenne Yates at [facility]  Other Active Problems  Seasonal allergies on Flonase at home  GERD on Protonix  Depression, on Effexor  Insomnia, Ambien when necessary at home  Hospital day # [place] for Pager information 02/09/2016 10:57 AM  I have personally examined this patient, reviewed notes, independently viewed imaging studies, participated in medical decision making and plan of care. I have made any additions or clarifications directly to the above note. Agree with note above. TEE shows no evidence of intra-atrial clot. She will need  loop recorder prior to discharge. She may consider possible participation in the [place] trial. Follow-up as an outpatient in 2 months or call earlier if necessary  Antony Contras, [place] Pager: [phone] 02/09/2016 3:05 PM  To contact Stroke Continuity provider, please refer to [email or web address] After hours, contact General Neurology\

## 2016-02-09 NOTE — Discharge Summary (Signed)
Stroke Discharge Summary  Patient ID: Erin Yates   MRN: [id number]      DOB: 06-22-1946  Date of Admission: 02/07/2016 Date of Discharge: 02/09/2016  Attending Physician:  Garvin Fila, MD, Stroke MD Consulting Physician(s):    Cristopher Peru, MD (EP) Patient's PCP:  Adin Hector, MD  DISCHARGE DIAGNOSIS:    Stroke: right parietal and posterior temporal parietal lobe infarcts embolic secondary to unknown source   Resultant L HH New    Hypertension   Hyperlipidemia   Diabetes   Cigarette smoker, advised to stop smoking   PFO    Seasonal allergies    GERD    Depression Insomnia  Past Medical History  Diagnosis Date  . Hypertension   . PFO (patent foramen ovale)     correction device placed 2010  . Dysrhythmia     prior to PFO correction  . Stroke [facility])     prior to PFO correction  . GERD (gastroesophageal reflux disease)   . Seasonal allergies   . Depression   . Diabetes mellitus without complication ([place])   . Hypercholesteremia   . Hypothyroidism   . Anxiety   . Right leg numbness     lower leg- residual from CVA  . 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 Anemia     past  hx  . Headache     before PFO closure   Past Surgical History  Procedure Laterality Date  . Abdominal hysterectomy    . Nasal sinus surgery    . Patent foramen ovale closure    . Foot neuroma surgery Left   . Cataract extraction w/phaco Right 09/01/2015    Procedure: CATARACT EXTRACTION PHACO AND INTRAOCULAR LENS PLACEMENT (IOC);  Surgeon: Leandrew Koyanagi, MD;  Location: [place];  Service: Ophthalmology;  Laterality: Right;  DIABETIC - oral meds  . Cataract extraction w/phaco Left 10/20/2015    Procedure: CATARACT EXTRACTION PHACO AND INTRAOCULAR LENS PLACEMENT (IOC);  Surgeon: Leandrew Koyanagi, MD;  Location: [place];  Service: Ophthalmology;  Laterality: Left;  DIABETIC - oral meds      Medication List    STOP taking these medications         clopidogrel 75 MG tablet  Commonly known as:  PLAVIX      TAKE these medications        ALPRAZolam 0.25 MG tablet  Commonly known as:  XANAX  Take 0.25 mg by mouth 2 (two) times daily as needed for anxiety.     amLODipine 10 MG tablet  Commonly known as:  NORVASC  Take 10 mg by mouth daily. PM     aspirin 325 MG EC tablet  Take 1 tablet (325 mg total) by mouth daily.     atorvastatin 80 MG tablet  Commonly known as:  LIPITOR  Take 80 mg by mouth daily. PM     buPROPion 100 MG 12 hr tablet  Commonly known as:  WELLBUTRIN SR  Take 100 mg by mouth 2 (two) times daily.     ergocalciferol 50000 units capsule  Commonly known as:  VITAMIN D2  Take 50,000 Units by mouth once a week.     fluticasone 50 MCG/ACT nasal spray  Commonly known as:  FLONASE  Place into both nostrils daily as needed for allergies or rhinitis.     levothyroxine 50 MCG tablet  Commonly known as:  SYNTHROID, LEVOTHROID  Take 50 mcg  by mouth daily before breakfast.     losartan 100 MG tablet  Commonly known as:  COZAAR  Take 100 mg by mouth daily. AM     metFORMIN 1000 MG tablet  Commonly known as:  GLUCOPHAGE  Take 1,000 mg by mouth 2 (two) times daily with a meal.     metoprolol 200 MG 24 hr tablet  Commonly known as:  TOPROL-XL  Take 200 mg by mouth daily. AM     pantoprazole 40 MG tablet  Commonly known as:  PROTONIX  Take 40 mg by mouth 2 (two) times daily.     venlafaxine XR 150 MG 24 hr capsule  Commonly known as:  EFFEXOR-XR  Take 150 mg by mouth daily with breakfast.     vitamin B-12 500 MCG tablet  Commonly known as:  CYANOCOBALAMIN  Take 500 mcg by mouth daily. AM     zolpidem 5 MG tablet  Commonly known as:  AMBIEN  Take 5 mg by mouth at bedtime as needed for sleep.        LABORATORY STUDIES CBC    Component Value Date/Time   WBC 7.4 02/07/2016 1104   RBC 4.03 02/07/2016 1104   HGB 12.9 02/07/2016 1104   HCT 37.7 02/07/2016 1104   PLT 276 02/07/2016 1104    MCV 93.6 02/07/2016 1104   MCH 32.1 02/07/2016 1104   MCHC 34.3 02/07/2016 1104   RDW 13.9 02/07/2016 1104   LYMPHSABS 2.2 02/07/2016 1104   MONOABS 0.4 02/07/2016 1104   EOSABS 0.2 02/07/2016 1104   BASOSABS 0.0 02/07/2016 1104   CMP    Component Value Date/Time   NA 136 02/07/2016 1104   K 4.0 02/07/2016 1104   CL 110 02/07/2016 1104   CO2 20* 02/07/2016 1104   GLUCOSE 231* 02/07/2016 1104   BUN 13 02/07/2016 1104   CREATININE 0.79 02/07/2016 1104   CALCIUM 9.2 02/07/2016 1104   PROT 7.1 02/07/2016 1104   ALBUMIN 3.7 02/07/2016 1104   AST 18 02/07/2016 1104   ALT 14 02/07/2016 1104   ALKPHOS 94 02/07/2016 1104   BILITOT 0.3 02/07/2016 1104   GFRNONAA >60 02/07/2016 1104   GFRAA >60 02/07/2016 1104   COAGS Lab Results  Component Value Date   INR 0.89 02/07/2016   Lipid Panel    Component Value Date/Time   CHOL 145 02/08/2016 0250   TRIG 361* 02/08/2016 0250   HDL 34* 02/08/2016 0250   CHOLHDL 4.3 02/08/2016 0250   VLDL 72* 02/08/2016 0250   LDLCALC 39 02/08/2016 0250   HgbA1C  Lab Results  Component Value Date   HGBA1C 6.4* 02/08/2016   Cardiac Panel (last 3 results)   Recent Labs  02/07/16 1104  TROPONINI <0.03   Urinalysis No results found for: COLORURINE, APPEARANCEUR, LABSPEC, PHURINE, GLUCOSEU, HGBUR, BILIRUBINUR, KETONESUR, PROTEINUR, UROBILINOGEN, NITRITE, LEUKOCYTESUR Urine Drug Screen No results found for: LABOPIA, COCAINSCRNUR, LABBENZ, AMPHETMU, THCU, LABBARB  Alcohol Level No results found for: [facility]   SIGNIFICANT DIAGNOSTIC STUDIES Ct Head  02/07/2016 No intracranial hemorrhage. Remote left frontal lobe, left parietal-occipital lobe, right caudate head region/ anterior limb right internal capsule and posterior right lenticular nucleus infarcts. Right parietal -occipital lobe infarct of indeterminate age. 1 cm calcification anterior right parafalcine region unchanged possibly a small calcified meningioma.   Ct Angio Head & Neck   02/07/2016 1. Negative for emergent large vessel occlusion. Positive for right PCA P3 branch occlusion. 2. Stable CT appearance of patchy right PCA ischemia from earlier today.  No intracranial hemorrhage or mass effect. 3. Generalized arterial tortuosity. No significant arterial stenosis. Minimal for age atherosclerosis at the arch and in the neck. Mild for age calcified ICA siphon plaque. 4. Emphysema with dependent pulmonary opacity, favor atelectasis.   Mr Brain Ltd W/o Cm 02/07/2016 Acute infarcts in the right parietal lobe and right posterior temporal parietal lobe in the cingulate gyrus. Examination limited to diffusion imaging only.   2D Echocardiogram  - Left ventricle: The cavity size was normal. Systolic function was normal. The estimated ejection fraction was in the range of 55% to 60%. Wall motion was normal; there were no regional wall motion abnormalities. Left ventricular diastolic function parameters were normal. - Mitral valve: Calcified annulus.  TEE PFO closure device in place, no other source of embolus seen     HISTORY OF PRESENT ILLNESS Kahri Sechrest is an 70 y.o. female with a history of CVA and PFO closure who reports waking up normal today. At 0830 02/07/2016 ([facility]) had the acute onset of right sided headache and loss of vision, worse on the left. Patient reports some right peripheral vision loss as a consequence of a previous stroke. Has some numbness below the knee on her right leg as a consequence of a previous stroke as well. Initial NIHSS of 4. Patient on Plavix at home. MRI brain obtained and showed revealed acute small right parietal lobe and posterior temporal lobe infarcts. Tpa was started and patient was transported to [place]: 1. She was admitted to the neuro ICU for further evaluation and treatment.    HOSPITAL COURSE Ms. Navina Waninger is a 70 y.o. female with history of prior stroke and PFO closure presenting with left homonymous hemianopia. She  received IV t-PA at [facility] for 01/23/2013 at 1156 and was shipped to [facility].   Stroke: right parietal and posterior temporal parietal lobe infarcts embolic secondary to unknown source  Resultant L HH New plus old partial right temporal field vision loss from prior stroke  MRI Right parietal and right posterior parietal temporal infarct  CTA head and neck no emergent large vessel occlusion. Right P3 branch occlusion. Emphysema with pulmonary opacity, Faber atelectasis.  2D Echo EF 60-65%, no SOE  TEE PFO closure device in place, no SOE  30 day monitor planned, if neg, will revisit implantable loop recorder - Ganji or [facility] to follow up. They are going to discuss and decide amongst themselves. (Has had palpitations and some sort of arrhythmia in the past. She has been having APCs in the hospital without AF. Due to that, it is likely that a 30 day monitor would be most appropriate for her. If that is not positive, may benefit from a LINQ in the future.)  LDL 39  HgbA1c 6.4  clopidogrel 75 mg daily prior to admission, now on clopidogrel 75 mg daily. Given stroke on plavix, will change to aspirin 325 mg daily. Dr Leonie Man discussed with DR. Ganji.   Ongoing aggressive stroke risk factor management  Consider stroke research study - Dr. Leonie Man will followup with pt.  Therapy recommendations: OP OT, OP PT   Disposition: Return home  Hypertension  Stable  Hyperlipidemia  Home meds: Lipitor 80 mg, resumed in hospital  LDL 39, goal < 70  Continue statin at discharge  Diabetes  HgbA1c 6.4, goal < 7.0  Other Stroke Risk Factors  Advanced age  Cigarette smoker, advised to stop smoking  Hx stroke/TIA  09/2007 with RLE hp  11/2007  with LLE numbness  01/2008 probable stroke with RUE hp  07/2010 L occipital and R basal ganglia infarct  headaches prior to PFO closure  PFO diagnosed 2009, underwent closure 04/2009 by Dr. Jenne Pane at  [facility]  Other Active Problems  Seasonal allergies on Flonase at home  GERD on Protonix  Depression, on Effexor  Insomnia, Ambien when necessary at home   DISCHARGE EXAM Blood pressure 130/55, pulse 61, temperature 98.9 F (37.2 C), temperature source Oral, resp. rate 17, SpO2 96 %. pleasant elderly Caucasian lady currently not in distress. . Afebrile. Head is nontraumatic. Neck is supple without bruit. Cardiac exam no murmur or gallop. Lungs are clear to auscultation. Distal pulses are well felt. Neurological Exam ; Oriented 3 alert, awake with normal speech and language function. No aphasia, dysarthria or apraxia. Fundi were not visualized. Pupils are equal reactive. Vision acuity diminished due to limited field of vision. Visual fields show dense left homonymous hemianopsia and mild right temporal field defect. Face is symmetric without weakness. Tongue is midline. Motor system exam revealed symmetric upper and lower extremity strength without focal weakness or drift. Fine finger movements are symmetric. Deep tendon reflexes are symmetric. Plantars are downgoing. Touch pinprick sensation are symmetric bilaterally. Gait was not tested.   Discharge Diet   Diet heart healthy/carb modified Room service appropriate?: Yes; Fluid consistency:: Thin liquids  DISCHARGE PLAN  Disposition:  Home with family  Outpatient PT and OT   aspirin 325 mg daily for secondary stroke prevention.  Patient advised not to drive at this time  OP 30 day monitor by cardiology. They will call her.  Follow-up BERT Briscoe Burns III, MD in 2 weeks.  Follow-up with Dr. Antony Contras, Stroke Clinic in 2 months.  35 minutes were spent preparing discharge.  [place] for Pager information 02/09/2016 2:05 PM  I have personally examined this patient, reviewed notes, independently viewed imaging studies, participated in medical decision making and plan of care. I have made any  additions or clarifications directly to the above note. Agree with note above. Consider possible participation in the [place] trial as an outpatient if interested.  Antony Contras, MD Medical Director [facility] Pager: [phone] 02/09/2016 4:04 PM

## 2016-02-09 NOTE — H&P (View-Only) (Signed)
PT Cancellation Note  Patient Details Name: Erin Yates MRN: [id number] DOB: 01/18/1946   Cancelled Treatment:    Reason Eval/Treat Not Completed: Patient at procedure or test/unavailable.  Pt currently at MRI.  Will check another time.     Alysa Duca, [place] Papas 02/08/2016, 1:25 PM

## 2016-02-09 NOTE — Care Management Note (Signed)
Case Management Note  Patient Details  Name: Erin Yates MRN: [id number] Date of Birth: 1946-05-12  Subjective/Objective:                    Action/Plan: Patient discharging home today with self care. Orders for outpatient PT/OT. CM met with the patient and her husband and they would like outpatient therapy at [facility]. CM placed orders in EPIC and on AVS. Bedside RN updated.   Expected Discharge Date:  02/11/16               Expected Discharge Plan:  Home/Self Care  In-House Referral:     Discharge planning Services  CM Consult  Post Acute Care Choice:    Choice offered to:     DME Arranged:    DME Agency:     HH Arranged:    HH Agency:     Status of Service:  Completed, signed off  Medicare Important Message Given:    Date Medicare IM Given:    Medicare IM give by:    Date Additional Medicare IM Given:    Additional Medicare Important Message give by:     If discussed at [place] of Stay Meetings, dates discussed:    Additional Comments:  Pollie Friar, RN 02/09/2016, 2:39 PM

## 2016-02-09 NOTE — Progress Notes (Signed)
  Echocardiogram Echocardiogram Transesophageal has been performed.  Erin Yates M 02/09/2016, 10:52 AM

## 2016-02-09 NOTE — Evaluation (Signed)
Physical Therapy Evaluation Patient Details Name: Erin Yates MRN: [id number] DOB: 04-20-1946 Today's Date: 02/09/2016   History of Present Illness  70 y.o. female with a history of CVA and PFO closure. Acute onset of right sided headache and loss of vision, worse on the left on 02/07/2016. MRI revealed acute small right parietal lobe and posterior temporal lobe infarcts.   Clinical Impression  Pt admitted with above diagnosis. Pt currently with functional limitations due to the deficits listed below (see PT Problem List). At the time of PT eval pt was able to perform transfers and ambulation with min guard assist for safety and balance support. Pt with occasional unsteadiness noted during dynamic OOB activity. Pt will benefit from skilled PT to increase their independence and safety with mobility to allow discharge to the venue listed below.       Follow Up Recommendations Outpatient Neuro PT;Supervision for mobility/OOB    Equipment Recommendations  None recommended by PT    Recommendations for Other Services       Precautions / Restrictions Precautions Precautions: Fall Restrictions Weight Bearing Restrictions: No      Mobility  Bed Mobility Overal bed mobility: Needs Assistance Bed Mobility: Supine to Sit     Supine to sit: Supervision     General bed mobility comments: HOB flat without use of bed rails.  Transfers Overall transfer level: Needs assistance Equipment used: None Transfers: Sit to/from Stand Sit to Stand: Min guard         General transfer comment: Close guard for safety as pt powered-up to full standing position. Pt was not reaching for outside support however had hands up in a high guard position due to feeling of unsteadiness. No obvious LOB noted.   Ambulation/Gait Ambulation/Gait assistance: Min guard Ambulation Distance (Feet): 175 Feet Assistive device: Rolling walker (2 wheeled) Gait Pattern/deviations: Step-through pattern;Decreased stride  length;Trunk flexed Gait velocity: Decreased Gait velocity interpretation: Below normal speed for age/gender General Gait Details: Generally slow and guarded. Pt with poor awarenss of smaller obstacles in the hall and often let the wheel of her walker get caught on door frames during turns.   Stairs Stairs: Yes Stairs assistance: Min guard Stair Management: Two rails;Alternating pattern;Forwards Number of Stairs: 4 General stair comments: Pt demonstrated safe negotiation of stairs. Close guard for safety however no assist was required.   Wheelchair Mobility    Modified Rankin (Stroke Patients Only) Modified Rankin (Stroke Patients Only) Pre-Morbid Rankin Score: No symptoms Modified Rankin: Moderate disability     Balance Overall balance assessment: Needs assistance Sitting-balance support: Feet supported;No upper extremity supported Sitting balance-Leahy Scale: Fair     Standing balance support: No upper extremity supported;During functional activity Standing balance-Leahy Scale: Fair                               Pertinent Vitals/Pain Pain Assessment: No/denies pain    Home Living Family/patient expects to be discharged to:: Private residence Living Arrangements: Spouse/significant other Available Help at Discharge: Family;Available 24 hours/day Type of Home: House Home Access: Stairs to enter Entrance Stairs-Rails: Right;Left;Can reach both Entrance Stairs-Number of Steps: 2-3 Home Layout: Two level;Able to live on main level with bedroom/bathroom Home Equipment: Gilford Rile - 2 wheels;Cane - single point      Prior Function Level of Independence: Independent         Comments: Was driving PTA     Hand Dominance   Dominant Hand: Right  Extremity/Trunk Assessment   Upper Extremity Assessment: Defer to OT evaluation           Lower Extremity Assessment: Generalized weakness      Cervical / Trunk Assessment: Normal  Communication    Communication: No difficulties  Cognition Arousal/Alertness: Awake/alert Behavior During Therapy: WFL for tasks assessed/performed Overall Cognitive Status: Impaired/Different from baseline Area of Impairment: Attention;Following commands;Safety/judgement;Awareness;Problem solving   Current Attention Level: Selective   Following Commands: Follows one step commands consistently;Follows one step commands with increased time Safety/Judgement: Decreased awareness of safety;Decreased awareness of deficits Awareness: Emergent Problem Solving: Slow processing;Requires verbal cues      General Comments      Exercises        Assessment/Plan    PT Assessment Patient needs continued PT services  PT Diagnosis Difficulty walking   PT Problem List Decreased strength;Decreased activity tolerance;Decreased range of motion;Decreased balance;Decreased mobility;Decreased knowledge of use of DME;Decreased safety awareness;Decreased knowledge of precautions  PT Treatment Interventions DME instruction;Gait training;Stair training;Functional mobility training;Therapeutic activities;Therapeutic exercise;Neuromuscular re-education;Patient/family education   PT Goals (Current goals can be found in the Care Plan section) Acute Rehab PT Goals Patient Stated Goal: have vision return to normal PT Goal Formulation: With patient Time For Goal Achievement: 02/16/16 Potential to Achieve Goals: Good    Frequency Min 4X/week   Barriers to discharge        Co-evaluation               End of Session Equipment Utilized During Treatment: Gait belt Activity Tolerance: Patient tolerated treatment well Patient left: in chair;with call bell/phone within reach;with family/visitor present Nurse Communication: Mobility status         Time: [id number] PT Time Calculation (min) (ACUTE ONLY): 16 min   Charges:   PT Evaluation $PT Eval Moderate Complexity: 1 Procedure     PT G Codes:         Rolinda Roan 02-12-16, 12:21 PM  Rolinda Roan, PT, DPT Acute Rehabilitation Services Pager: [phone]

## 2016-02-09 NOTE — Evaluation (Signed)
Occupational Therapy Evaluation Patient Details Name: Erin Yates MRN: [id number] DOB: 25-Jan-1946 Today's Date: 02/09/2016    History of Present Illness 70 y.o. female with a history of CVA and PFO closure. Acute onset of right sided headache and loss of vision, worse on the left on 02/07/2016. MRI revealed acute small right parietal lobe and posterior temporal lobe infarcts.    Clinical Impression   Pt reports she was independent with ADLs and mobility PTA. Currently pt overall min guard for safety with ADLs and functional mobility. Pt reports increased blurriness; "gets better" with one eye occluded but does not resolve. Formal visual testing attempted but pt reports increased dizziness and unable to tolerate. Pt able to complete functional activities but c/o blurriness throughout. Discussed no driving upon d/c and f/u with eye doctor; pt reports husband can drive as needed. Recommending outpatient neuro OT for follow up in order to maximize independence and safety with IADLs. Pt would benefit from continued skilled OT to address established goals.    Follow Up Recommendations  Outpatient OT;Supervision/Assistance - 24 hour (Neuro)    Equipment Recommendations  None recommended by OT    Recommendations for Other Services PT consult     Precautions / Restrictions Restrictions Weight Bearing Restrictions: No      Mobility Bed Mobility Overal bed mobility: Needs Assistance Bed Mobility: Supine to Sit     Supine to sit: Supervision     General bed mobility comments: HOB flat without use of bed rails.  Transfers Overall transfer level: Needs assistance Equipment used: None Transfers: Sit to/from Stand Sit to Stand: Min guard         General transfer comment: Min guard for safety. Pt holding on to wall/door frame with dynamic standing.    Balance Overall balance assessment: Needs assistance Sitting-balance support: Feet supported;No upper extremity supported Sitting  balance-Leahy Scale: Fair     Standing balance support: No upper extremity supported Standing balance-Leahy Scale: Fair                              ADL Overall ADL's : Needs assistance/impaired Eating/Feeding: Set up;Sitting   Grooming: Wash/dry hands;Oral care;Min guard;Standing   Upper Body Bathing: Supervision/ safety;Sitting   Lower Body Bathing: Min guard;Sit to/from stand   Upper Body Dressing : Supervision/safety;Set up;Sitting   Lower Body Dressing: Min guard;Sit to/from stand Lower Body Dressing Details (indicate cue type and reason): Pt able to pull up socks sitting EOB. Toilet Transfer: Min guard;Ambulation;Regular Museum/gallery exhibitions officer and Hygiene: Min guard;Sit to/from stand       Functional mobility during ADLs: Min guard General ADL Comments: Pt with visual deficits but able to complete functional activities with minimal difficulty. Discussed no driving and follow up with eye doctor.     Vision Vision Assessment?: Yes Eye Alignment: Within Functional Limits Ocular Range of Motion: Impaired-to be further tested in functional context (dizziness with formal testing) Tracking/Visual Pursuits: Impaired - to be further tested in functional context (dizziness with formal testing) Visual Fields: Impaired-to be further tested in functional context (inconsistent responses)   Perception     Praxis      Pertinent Vitals/Pain Pain Assessment: No/denies pain     Hand Dominance Right   Extremity/Trunk Assessment Upper Extremity Assessment Upper Extremity Assessment: Generalized weakness   Lower Extremity Assessment Lower Extremity Assessment: Defer to PT evaluation   Cervical / Trunk Assessment Cervical / Trunk Assessment: Normal  Communication Communication Communication: No difficulties   Cognition Arousal/Alertness: Awake/alert Behavior During Therapy: WFL for tasks assessed/performed Overall Cognitive Status: No  family/caregiver present to determine baseline cognitive functioning                     General Comments       Exercises       Shoulder Instructions      Home Living Family/patient expects to be discharged to:: Private residence Living Arrangements: Spouse/significant other Available Help at Discharge: Family;Available 24 hours/day Type of Home: House Home Access: Stairs to enter [employer] of Steps: 3   Home Layout: Two level;Able to live on main level with bedroom/bathroom     Bathroom Shower/Tub: Tub/shower unit Shower/tub characteristics: Architectural technologist: Standard     Home Equipment: Environmental consultant - 2 wheels;Cane - single point          Prior Functioning/Environment Level of Independence: Independent        Comments: Was driving PTA    OT Diagnosis: Generalized weakness;Disturbance of vision   OT Problem List: Decreased strength;Impaired balance (sitting and/or standing);Impaired vision/perception;Decreased safety awareness;Decreased knowledge of use of DME or AE   OT Treatment/Interventions: Self-care/ADL training;Therapeutic exercise;Energy conservation;DME and/or AE instruction;Therapeutic activities;Visual/perceptual remediation/compensation;Patient/family education;Balance training    OT Goals(Current goals can be found in the care plan section) Acute Rehab OT Goals Patient Stated Goal: have vision return to normal OT Goal Formulation: With patient Time For Goal Achievement: 02/23/16 Potential to Achieve Goals: Good ADL Goals Pt Will Perform Upper Body Bathing: with modified independence;sitting;standing Pt Will Perform Lower Body Bathing: with modified independence;sit to/from stand Pt Will Perform Tub/Shower Transfer: Tub transfer;with modified independence;ambulating Pt/caregiver will Perform Home Exercise Program: Increased strength;Both right and left upper extremity;With theraband;With theraputty;Independently;With written  HEP provided  OT Frequency: Min 2X/week   Barriers to D/C:            Co-evaluation              End of Session Equipment Utilized During Treatment: Gait belt  Activity Tolerance: Patient tolerated treatment well Patient left: in chair;with call bell/phone within reach;with chair alarm set   Time: [phone] OT Time Calculation (min): 24 min Charges:  OT General Charges $OT Visit: 1 Procedure OT Evaluation $OT Eval Moderate Complexity: 1 Procedure OT Treatments $Self Care/Home Management : 8-22 mins G-Codes:     Binnie Kand M.S., OTR/L Pager: [phone]  02/09/2016, 8:46 AM

## 2016-02-10 ENCOUNTER — Encounter (HOSPITAL_COMMUNITY): Payer: Self-pay | Admitting: Cardiology

## 2016-02-15 ENCOUNTER — Other Ambulatory Visit: Payer: Self-pay | Admitting: *Deleted

## 2016-02-15 ENCOUNTER — Ambulatory Visit (INDEPENDENT_AMBULATORY_CARE_PROVIDER_SITE_OTHER): Payer: Commercial Managed Care - HMO

## 2016-02-15 DIAGNOSIS — R002 Palpitations: Secondary | ICD-10-CM | POA: Diagnosis not present

## 2016-02-15 DIAGNOSIS — I639 Cerebral infarction, unspecified: Secondary | ICD-10-CM

## 2016-02-15 DIAGNOSIS — G464 Cerebellar stroke syndrome: Secondary | ICD-10-CM | POA: Diagnosis not present

## 2016-02-15 NOTE — Patient Outreach (Addendum)
[facility]) Care Management  02/15/2016  Jamyrah Tealer [birth year] [id number]  Subjective: Telephone call to patient's home number, spoke with patient, and HIPAA verified.  Patient states she is doing well and accidentally hit the wrong button when she received the automated call from [facility].   Discussed the [facility] EMMI Stroke program and [facility] Care Management program.  Patient voiced understanding and is in agreement to complete telephone screen. Patient states she has not tried to balance check book or clean her house since she has been home from the hospital.   States her husband is doing the home management and assist her with whatever she needs.   Patient states she has a history of diabetes, hypertension and hyperlipidemia.  Patient states she does not have any disease management, disease education, care coordination or transition of care needs at this time.  Patient given RNCM's contact number, [facility] Main number, and 24 hour Nurse Advice line number for future reference.   Patient in agreement to continue to receive automated EMMI Stroke outreach calls and RNCM red flag alert follow up calls as needed.   Objective: Per chart review: Patient hospitalized 02/07/16 - 02/09/16 for CVA.    Assessment: Received EMMI Stroke red flag referral on 02/15/16.    Red flag trigger: Patient answered yes to feeling worse overall.   Report Date: 02/15/16.   Trigger date: 02/14/16.    Red flag follow up completed and no [facility] Care Management needs at this time.   Plan: RNCM will send case closure due to program completion/ no care management needs request to Josepha Pigg at [facility].   Kacelyn Rowzee H. Annia Friendly, BSN, [place] Management [facility] Telephonic CM Phone: [phone] Fax: [phone]

## 2016-02-17 ENCOUNTER — Encounter: Payer: Self-pay | Admitting: Physical Therapy

## 2016-02-17 ENCOUNTER — Ambulatory Visit: Payer: Commercial Managed Care - HMO | Attending: Neurology | Admitting: Physical Therapy

## 2016-02-17 DIAGNOSIS — R2681 Unsteadiness on feet: Secondary | ICD-10-CM | POA: Diagnosis not present

## 2016-02-17 DIAGNOSIS — M6281 Muscle weakness (generalized): Secondary | ICD-10-CM | POA: Insufficient documentation

## 2016-02-17 DIAGNOSIS — R262 Difficulty in walking, not elsewhere classified: Secondary | ICD-10-CM

## 2016-02-17 DIAGNOSIS — R279 Unspecified lack of coordination: Secondary | ICD-10-CM

## 2016-02-17 NOTE — Therapy (Signed)
[place] MAIN [facility] SERVICES [address] [place], [place], [address] Phone: [phone]   Fax:  [phone]  Physical Therapy Evaluation  Patient Details  Name: Erin Yates MRN: [id number] Date of Birth: [birth year] Referring Provider: Dr. [name]  Encounter Date: 02/17/2016      PT End of Session - 02/17/16 0922    Visit Number 1   Number of Visits 25   Date for PT Re-Evaluation 2016/06/07   Authorization Type g codes   Authorization Time Period 1/10   PT Start Time 0830   PT Stop Time 0915   PT Time Calculation (min) 45 min   Equipment Utilized During Treatment Gait belt   Activity Tolerance Patient tolerated treatment well   Behavior During Therapy [facility] for tasks assessed/performed      Past Medical History  Diagnosis Date  . Hypertension   . PFO (patent foramen ovale)     correction device placed 2010  . Dysrhythmia     prior to PFO correction  . Stroke [facility])     prior to PFO correction  . GERD (gastroesophageal reflux disease)   . Seasonal allergies   . Depression   . Diabetes mellitus without complication ([place])   . Hypercholesteremia   . Hypothyroidism   . Anxiety   . Right leg numbness     lower leg- residual from CVA  . 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 Anemia     past  hx  . Headache     before PFO closure    Past Surgical History  Procedure Laterality Date  . Abdominal hysterectomy    . Nasal sinus surgery    . Patent foramen ovale closure    . Foot neuroma surgery Left   . Cataract extraction w/phaco Right 09/01/2015    Procedure: CATARACT EXTRACTION PHACO AND INTRAOCULAR LENS PLACEMENT (IOC);  Surgeon: Leandrew Koyanagi, MD;  Location: [place];  Service: Ophthalmology;  Laterality: Right;  DIABETIC - oral meds  . Cataract extraction w/phaco Left 10/20/2015    Procedure: CATARACT EXTRACTION PHACO AND INTRAOCULAR LENS PLACEMENT (IOC);  Surgeon: Leandrew Koyanagi, MD;   Location: [place];  Service: Ophthalmology;  Laterality: Left;  DIABETIC - oral meds  . Tee without cardioversion N/A 02/09/2016    Procedure: TRANSESOPHAGEAL ECHOCARDIOGRAM (TEE);  Surgeon: Adrian Prows, MD;  Location: [facility] ENDOSCOPY;  Service: Cardiovascular;  Laterality: N/A;    There were no vitals filed for this visit.       Subjective Assessment - 02/17/16 0838    Subjective Pt presents after CVA on 02/07/16 with main deficits of balance and vision per pt report. She has had 6 strokes in the past and was able to notice the symptoms right away. This stroke has effected her more than the previous strokes in her balance and vision.   Patient is accompained by: Family member   Pertinent History 6 previous CVAs, R lower leg numbness and decreased R peripheral vision from previous strokes   Patient Stated Goals walk without bumping into things on the L side   Currently in Pain? No/denies            [facility] PT Assessment - 02/17/16 0001    Assessment   Medical Diagnosis CVA   Referring Provider Dr. [name]   Onset Date/Surgical Date 02/07/16   Hand Dominance Right   Next MD Visit 02/22/16   Prior Therapy physical therapy for previous strokes  Precautions   Precautions Fall   Restrictions   Weight Bearing Restrictions No   Balance Screen   Has the patient fallen in the past 6 months No   Has the patient had a decrease in activity level because of a fear of falling?  Yes   Is the patient reluctant to leave their home because of a fear of falling?  Yes  Due to decreased vision and fear of falling.   Home Environment   Living Environment Private residence   Living Arrangements Spouse/significant other   Available Help at Discharge Family;Available 24 hours/day   Type of Home House   Home Access Stairs to enter   Entrance Stairs-Number of Steps 3   Entrance Stairs-Rails Can reach both   Home Layout Two level;Able to live on main level with bedroom/bathroom   [place] - 2 wheels;Walker - 4 wheels;Cane - single point   Prior Function   Level of Independence Independent;Independent with basic ADLs;Independent with community mobility without device   Vocation Retired        POSTURE/OBSERVATION:   PROM/AROM:  STRENGTH:  Graded on a 0-5 scale Muscle Group Left Right  Shoulder flex    Shoulder Abd    Shoulder Ext    Shoulder IR/ER    Elbow    Wrist/hand     Hip Flex 3+/5 4-/5  Hip Abd 3+/5 4-/5  Hip Add 4/5 4/5  Hip Ext    Hip IR/ER    Knee Flex 4/5 4/5  Knee Ext 3+/5 4-/5  Ankle DF 4/5 4/5  Ankle PF 4/5 4/5   SENSATION: R lower leg decreased light touch due to previous stroke.   FUNCTIONAL MOBILITY: Pt requires min guard for transfers of various surfaces with SPC   BALANCE: Pt demonstrates fair static balance and fair minus dynamic balance. She is moderately effected by her decreased vision.   GAIT: Pt demonstrates very slow and cautions gait, with left/right sway and difficulty navigating due to decreased peripheral vision.  OUTCOME MEASURES: TEST Outcome Interpretation  5 times sit<>stand 36.27 sec >60 yo, >15 sec indicates increased risk for falls  10 meter walk test       0.29          m/s <1.0 m/s indicates increased risk for falls; limited community ambulator  Timed up and Go       29.5          sec <14 sec indicates increased risk for falls                          PT Education - 02/17/16 0921    Education provided Yes   Education Details HEP corner balance, roll of vision in balance   Person(s) Educated Patient   Methods Explanation;Demonstration   Comprehension Verbalized understanding;Returned demonstration             PT Long Term Goals - 02/17/16 0926    PT LONG TERM GOAL #1   Title Pt will score < 14 sec on 5 time STS to reduce risk of falls within 12 wks.   Baseline 36.27 sec   Time 12   Period Weeks   Status New   PT LONG TERM GOAL #2   Title Pt will score <20 sec on  TUG to increased functional mobility and safety within 12 wks.   Baseline 29.5   Time 12   Period Weeks   Status New  PT LONG TERM GOAL #3   Title Pt will score > or equal to 0.8 m/s on 10 meter walk test for increased community ambulation and return to PLOF within 12 wks.   Baseline 0.29 m/s   Time 12   Period Weeks   Status New               Plan - 02/17/16 [id number]    Clinical Impression Statement 70 y.o. female with a history of CVA and PFO closure. Acute onset of right sided headache and loss of vision, worse on the left on 02/07/2016. MRI revealed acute small right parietal lobe and posterior temporal lobe infarcts.  She demonstrates deficits of strength, balance, gait and coordination. Pt will benefit from skilled PT to address functional deficits and progress towards PLOF.   Rehab Potential Fair   Clinical Impairments Affecting Rehab Potential multiple previous strokes, impaired vision   PT Frequency 2x / week   PT Duration 12 weeks   PT Treatment/Interventions ADLs/Self Care Home Management;Gait training;Stair training;Therapeutic activities;Therapeutic exercise;Balance training;Neuromuscular re-education;Patient/family education   PT Next Visit Plan strength and balance training   PT Home Exercise Plan corner rhomberg balance given 4/12   Consulted and Agree with Plan of Care Patient      Patient will benefit from skilled therapeutic intervention in order to improve the following deficits and impairments:  Abnormal gait, Decreased activity tolerance, Decreased balance, Decreased coordination, Decreased mobility, Decreased strength, Difficulty walking, Impaired sensation, Impaired vision/preception  Visit Diagnosis: Difficulty in walking, not elsewhere classified - Plan: PT plan of care cert/re-cert  Muscle weakness (generalized) - Plan: PT plan of care cert/re-cert  Unsteadiness on feet - Plan: PT plan of care cert/re-cert  Unspecified lack of coordination - Plan: PT  plan of care cert/re-cert      G-Codes - [id number]    Functional Assessment Tool Used 5x STS, TUG, 10 meter walk, clinical judgement   Functional Limitation Mobility: Walking and moving around   Mobility: Walking and Moving Around Current Status [id number]) At least 20 percent but less than 40 percent impaired, limited or restricted   Mobility: Walking and Moving Around Goal Status [id number]) 0 percent impaired, limited or restricted       Problem List Patient Active Problem List   Diagnosis Date Noted  . Stroke (cerebrum) ([place])   . CVA (cerebral infarction) 02/07/2016    Aarti Mankowski Shiela Mayer, PT, DPT  02/17/2016, 11:29 AM [place] MAIN [facility] SERVICES [address] [place], [place], [address] Phone: [phone]   Fax:  [phone]  Name: Erin Yates MRN: [id number] Date of Birth: 19-Jan-1946

## 2016-02-17 NOTE — Patient Instructions (Signed)
Provided handout and reviewed for corner balance activity: standing rhomberg with HT and progressing from UE support to no UE support up to 30 sec x3. From [email or web address]

## 2016-02-18 ENCOUNTER — Other Ambulatory Visit: Payer: Self-pay | Admitting: *Deleted

## 2016-02-18 NOTE — Patient Outreach (Signed)
[place] [facility]) Care Management  02/18/2016  Erin Yates 01-24-1946 [id number]  Subjective: Telephone call to patient's home number, spoke with patient, and HIPAA verified.   Patient states she is doing fine.   Discussed [facility] EMMI Stroke and Care Management program.   Patient states she remembers talking with RNCM on 02/15/16 regarding the programs, does not have any care management needs at this time and in agreement to complete telephone screen.  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  Patient states she does smoke, not interested in smoking cessation at this time, aware of increase risk with stroke, and is in agreement to receive information on smoking cessation.  Patient states she smoke 1 pack of cigarettes over a few days.  RNCM advised of strategies to use to decrease smoking and patient voiced understanding.    Patient states she saw her primary MD Dr. [name] about 3 months ago and has her next follow up appointment on 02/23/16.  States she had physical therapy evaluation on 02/17/16 and it went well.   Patient states she currently walks with a straight cane and does feel any weakness of her left side.  States during her physical therapy evaluation on 02/17/16, therapist noted left side weakness.   States physical therapist also encouraged her to try to walk without cane at home and patient states she does not feel comfortable without cane.   States she does not feel like she will need physical therapy for more than 1 more visit because she knows what do, can do exercises at home and the $45 copayment per visit may cause financial hardship in the future.    RNCM advised patient to discuss her progress and concerns with physical therapist at next visit.  Patient voices understanding and states she will follow up with therapist.      Patient has been given RNCM's contact number, [place]  phone number, and 24 hour Nurse Advice line number during previous call on 02/15/16.  Objective: Per chart review: Patient hospitalized 02/07/16 - 02/09/16 for CVA. Outpatient physical therapy evaluation completed on 02/17/16.  Assessment: Received EMMI Stroke red flag referral on 02/18/16. Red flag trigger: Patient answered yes to smoked or been around smoke. Report Date: 02/18/16. Trigger date: 02/17/16.Day # 6.  Red flag follow up completed and no [facility] Care Management needs at this time.  RNCM verified patient has been scheduled to receive SMOKING CESSATION - THINKING ABOUT QUITTING SMOKING information through [facility] Stroke program.    Plan: RNCM will send case closure due to program completion/ no care management needs request to Josepha Pigg at [facility].   Ledell Codrington H. Annia Friendly, BSN, [place] [facility] Telephonic CM Phone: [phone] Fax: [phone]

## 2016-02-22 ENCOUNTER — Ambulatory Visit: Payer: Commercial Managed Care - HMO | Admitting: Physical Therapy

## 2016-02-22 ENCOUNTER — Encounter: Payer: Self-pay | Admitting: Physical Therapy

## 2016-02-22 DIAGNOSIS — R262 Difficulty in walking, not elsewhere classified: Secondary | ICD-10-CM | POA: Diagnosis not present

## 2016-02-22 DIAGNOSIS — R279 Unspecified lack of coordination: Secondary | ICD-10-CM | POA: Diagnosis not present

## 2016-02-22 DIAGNOSIS — R2681 Unsteadiness on feet: Secondary | ICD-10-CM | POA: Diagnosis not present

## 2016-02-22 DIAGNOSIS — M6281 Muscle weakness (generalized): Secondary | ICD-10-CM | POA: Diagnosis not present

## 2016-02-22 NOTE — Therapy (Signed)
[place] MAIN [facility] SERVICES [address] [place], [place], [address] Phone: [phone]   Fax:  [phone]  Physical Therapy Treatment  Patient Details  Name: Erin Yates MRN: [id number] Date of Birth: 1946-06-16 Referring Provider: Dr. [name]  Encounter Date: 02/22/2016      PT End of Session - 02/22/16 1711    Visit Number 2   Number of Visits 25   Date for PT Re-Evaluation 20-May-2016   Authorization Type g codes   PT Start Time 0500   PT Stop Time 0540   PT Time Calculation (min) 40 min      Past Medical History  Diagnosis Date  . Hypertension   . PFO (patent foramen ovale)     correction device placed 2010  . Dysrhythmia     prior to PFO correction  . Stroke [facility])     prior to PFO correction  . GERD (gastroesophageal reflux disease)   . Seasonal allergies   . Depression   . Diabetes mellitus without complication ([place])   . Hypercholesteremia   . Hypothyroidism   . Anxiety   . Right leg numbness     lower leg- residual from CVA  . 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 Anemia     past  hx  . Headache     before PFO closure    Past Surgical History  Procedure Laterality Date  . Abdominal hysterectomy    . Nasal sinus surgery    . Patent foramen ovale closure    . Foot neuroma surgery Left   . Cataract extraction w/phaco Right 09/01/2015    Procedure: CATARACT EXTRACTION PHACO AND INTRAOCULAR LENS PLACEMENT (IOC);  Surgeon: Leandrew Koyanagi, MD;  Location: [place];  Service: Ophthalmology;  Laterality: Right;  DIABETIC - oral meds  . Cataract extraction w/phaco Left 10/20/2015    Procedure: CATARACT EXTRACTION PHACO AND INTRAOCULAR LENS PLACEMENT (IOC);  Surgeon: Leandrew Koyanagi, MD;  Location: [place];  Service: Ophthalmology;  Laterality: Left;  DIABETIC - oral meds  . Tee without cardioversion N/A 02/09/2016    Procedure: TRANSESOPHAGEAL ECHOCARDIOGRAM (TEE);  Surgeon: Adrian Prows, MD;  Location: [facility] ENDOSCOPY;  Service: Cardiovascular;  Laterality: N/A;    There were no vitals filed for this visit.      Subjective Assessment - 02/22/16 1711    Subjective Pt presents after CVA on 02/07/16 with main deficits of balance and vision per pt report. She has had 6 strokes in the past and was able to notice the symptoms right away. This stroke has effected her more than the previous strokes in her balance and vision.   Patient is accompained by: Family member   Pertinent History 6 previous CVAs, R lower leg numbness and decreased R peripheral vision from previous strokes   Patient Stated Goals walk without bumping into things on the L side     Therapeutic exercise:   standing hip abd with YTB x 20  side stepping left and right in parallel bars 10 feet x 3 step ups from floor to 6 inch stool x 20 bilateral sit to stand x 10 marching in parallel bars x 20 Leg press 60 lbs x 20   TM x 3 mintues . 6 miles / hour Patient needs occasional verbal cueing to improve posture and cueing to correctly perform exercises slowly, holding at end of range to increase motor firing of desired muscle to encourage fatigue.  PT Education - 02/22/16 1711    Education provided Yes   Education Details safety with spc   Person(s) Educated Patient   Methods Explanation   Comprehension Verbalized understanding             PT Long Term Goals - 02/17/16 0926    PT LONG TERM GOAL #1   Title Pt will score < 14 sec on 5 time STS to reduce risk of falls within 12 wks.   Baseline 36.27 sec   Time 12   Period Weeks   Status New   PT LONG TERM GOAL #2   Title Pt will score <20 sec on TUG to increased functional mobility and safety within 12 wks.   Baseline 29.5   Time 12   Period Weeks   Status New   PT LONG TERM GOAL #3   Title Pt will score > or equal to 0.8 m/s on 10 meter walk test for increased community ambulation and return to  PLOF within 12 wks.   Baseline 0.29 m/s   Time 12   Period Weeks   Status New               Plan - 02/22/16 1712    Clinical Impression Statement CGA and Min to mod verbal cues used throughout with increased in postural sway and LOB most seen with narrow base of support and while on uneven surfaces. Continues to have balance deficits typical with diagnosis. Patient performs intermediate level exercises without pain behaviors and needs verbal cuing for postural alignment and head positioning Tactile cues and assistance needed to keep lower leg and knee in neutral to avoid compensations with ankle motions   Rehab Potential Fair   Clinical Impairments Affecting Rehab Potential multiple previous strokes, impaired vision   PT Frequency 2x / week   PT Duration 12 weeks   PT Treatment/Interventions ADLs/Self Care Home Management;Gait training;Stair training;Therapeutic activities;Therapeutic exercise;Balance training;Neuromuscular re-education;Patient/family education   PT Next Visit Plan strength and balance training   PT Home Exercise Plan corner rhomberg balance given 4/12   Consulted and Agree with Plan of Care Patient      Patient will benefit from skilled therapeutic intervention in order to improve the following deficits and impairments:  Abnormal gait, Decreased activity tolerance, Decreased balance, Decreased coordination, Decreased mobility, Decreased strength, Difficulty walking, Impaired sensation, Impaired vision/preception  Visit Diagnosis: Difficulty in walking, not elsewhere classified  Muscle weakness (generalized)  Unsteadiness on feet  Unspecified lack of coordination     Problem List Patient Active Problem List   Diagnosis Date Noted  . Stroke (cerebrum) ([place])   . CVA (cerebral infarction) 02/07/2016   Alanson Puls, PT, DPT [place], [place] S 02/22/2016, 5:14 PM  [place] MAIN [facility] SERVICES [address] [place], [place], [address] Phone: [phone]   Fax:  [phone]  Name: Erin Yates MRN: [id number] Date of Birth: May 22, 1946

## 2016-02-23 DIAGNOSIS — F3342 Major depressive disorder, recurrent, in full remission: Secondary | ICD-10-CM | POA: Diagnosis not present

## 2016-02-23 DIAGNOSIS — E784 Other hyperlipidemia: Secondary | ICD-10-CM | POA: Diagnosis not present

## 2016-02-23 DIAGNOSIS — D519 Vitamin B12 deficiency anemia, unspecified: Secondary | ICD-10-CM | POA: Diagnosis not present

## 2016-02-23 DIAGNOSIS — I38 Endocarditis, valve unspecified: Secondary | ICD-10-CM | POA: Diagnosis not present

## 2016-02-23 DIAGNOSIS — I10 Essential (primary) hypertension: Secondary | ICD-10-CM | POA: Diagnosis not present

## 2016-02-23 DIAGNOSIS — I739 Peripheral vascular disease, unspecified: Secondary | ICD-10-CM | POA: Diagnosis not present

## 2016-02-23 DIAGNOSIS — E538 Deficiency of other specified B group vitamins: Secondary | ICD-10-CM | POA: Diagnosis not present

## 2016-02-23 DIAGNOSIS — E1129 Type 2 diabetes mellitus with other diabetic kidney complication: Secondary | ICD-10-CM | POA: Diagnosis not present

## 2016-02-23 DIAGNOSIS — I6310 Cerebral infarction due to embolism of unspecified precerebral artery: Secondary | ICD-10-CM | POA: Diagnosis not present

## 2016-02-24 ENCOUNTER — Ambulatory Visit: Payer: Commercial Managed Care - HMO | Admitting: Physical Therapy

## 2016-02-29 ENCOUNTER — Ambulatory Visit: Payer: Commercial Managed Care - HMO | Admitting: Physical Therapy

## 2016-02-29 ENCOUNTER — Encounter: Payer: Self-pay | Admitting: Physical Therapy

## 2016-02-29 DIAGNOSIS — R262 Difficulty in walking, not elsewhere classified: Secondary | ICD-10-CM

## 2016-02-29 DIAGNOSIS — M6281 Muscle weakness (generalized): Secondary | ICD-10-CM

## 2016-02-29 DIAGNOSIS — R2681 Unsteadiness on feet: Secondary | ICD-10-CM | POA: Diagnosis not present

## 2016-02-29 DIAGNOSIS — R279 Unspecified lack of coordination: Secondary | ICD-10-CM | POA: Diagnosis not present

## 2016-02-29 NOTE — Therapy (Signed)
[place] MAIN [facility] SERVICES [address] [place], [place], [address] Phone: [phone]   Fax:  [phone]  Physical Therapy Treatment  Patient Details  Name: Erin Yates MRN: [id number] Date of Birth: Dec 04, 1945 Referring Provider: Dr. [name]  Encounter Date: 02/29/2016      PT End of Session - 02/29/16 1729    Visit Number 3   Number of Visits 25   Date for PT Re-Evaluation 05/25/16   Authorization Type g codes   PT Start Time 0450   PT Stop Time 0530   PT Time Calculation (min) 40 min   Equipment Utilized During Treatment Gait belt      Past Medical History  Diagnosis Date  . Hypertension   . PFO (patent foramen ovale)     correction device placed 2010  . Dysrhythmia     prior to PFO correction  . Stroke [facility])     prior to PFO correction  . GERD (gastroesophageal reflux disease)   . Seasonal allergies   . Depression   . Diabetes mellitus without complication ([place])   . Hypercholesteremia   . Hypothyroidism   . Anxiety   . Right leg numbness     lower leg- residual from CVA  . 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 Anemia     past  hx  . Headache     before PFO closure    Past Surgical History  Procedure Laterality Date  . Abdominal hysterectomy    . Nasal sinus surgery    . Patent foramen ovale closure    . Foot neuroma surgery Left   . Cataract extraction w/phaco Right 09/01/2015    Procedure: CATARACT EXTRACTION PHACO AND INTRAOCULAR LENS PLACEMENT (IOC);  Surgeon: Leandrew Koyanagi, MD;  Location: [place];  Service: Ophthalmology;  Laterality: Right;  DIABETIC - oral meds  . Cataract extraction w/phaco Left 10/20/2015    Procedure: CATARACT EXTRACTION PHACO AND INTRAOCULAR LENS PLACEMENT (IOC);  Surgeon: Leandrew Koyanagi, MD;  Location: [place];  Service: Ophthalmology;  Laterality: Left;  DIABETIC - oral meds  . Tee without cardioversion N/A 02/09/2016    Procedure:  TRANSESOPHAGEAL ECHOCARDIOGRAM (TEE);  Surgeon: Adrian Prows, MD;  Location[facility] ENDOSCOPY;  Service: Cardiovascular;  Laterality: N/A;    There were no vitals filed for this visit.      Subjective Assessment - 02/29/16 1727    Subjective Patient has fatigue with exercises and has loss of balance with intermediate dynamic standing activities.    Patient is accompained by: Family member   Pertinent History 6 previous CVAs, R lower leg numbness and decreased R peripheral vision from previous strokes   Patient Stated Goals walk without bumping into things on the L side    Therapeutic exercise and neuromuscular training  standing hip abd with YTB x 20  side stepping left and right in parallel bars 10 feet x 3 standing on blue foam with cone reaching x 20 across midline step ups from floor to 6 inch stool x 20 bilateral sit to stand x 10 marching in parallel bars x 20 Side stepping in TM x 3 mintues . 3 miles / hour Patient needs moderate verbal cueing to improve posture and cueing to correctly perform exercises slowly, holding at end of range to increase motor firing of desired muscle to encourage fatigue  Min cueing needed to appropriately perform balance and exercise tasks with leg, hand, and head position.  Decreased coordination demonstrated requiring consistent verbal cueing to correct form. Patient demonstrates some in coordination of movement. Patient responds well to verbal and tactile cues to correct form and technique.  CGA to SBA for safety with activities.                              PT Education - 02/29/16 1729    Education provided Yes   Education Details HEP progression   Person(s) Educated Patient   Methods Explanation   Comprehension Verbalized understanding             PT Long Term Goals - 02/17/16 0926    PT LONG TERM GOAL #1   Title Pt will score < 14 sec on 5 time STS to reduce risk of falls within 12 wks.   Baseline 36.27 sec   Time 12    Period Weeks   Status New   PT LONG TERM GOAL #2   Title Pt will score <20 sec on TUG to increased functional mobility and safety within 12 wks.   Baseline 29.5   Time 12   Period Weeks   Status New   PT LONG TERM GOAL #3   Title Pt will score > or equal to 0.8 m/s on 10 meter walk test for increased community ambulation and return to PLOF within 12 wks.   Baseline 0.29 m/s   Time 12   Period Weeks   Status New               Plan - 02/29/16 1730    Clinical Impression Statement Fatigue with sit to stand but demonstrating more control, Increase weight for standing exercises. Fatigue still evident with cross trainer and endurance   Rehab Potential Fair   Clinical Impairments Affecting Rehab Potential multiple previous strokes, impaired vision   PT Frequency 2x / week   PT Duration 12 weeks   PT Treatment/Interventions ADLs/Self Care Home Management;Gait training;Stair training;Therapeutic activities;Therapeutic exercise;Balance training;Neuromuscular re-education;Patient/family education   PT Next Visit Plan strength and balance training   PT Home Exercise Plan corner rhomberg balance given 4/12   Consulted and Agree with Plan of Care Patient      Patient will benefit from skilled therapeutic intervention in order to improve the following deficits and impairments:  Abnormal gait, Decreased activity tolerance, Decreased balance, Decreased coordination, Decreased mobility, Decreased strength, Difficulty walking, Impaired sensation, Impaired vision/preception  Visit Diagnosis: Difficulty in walking, not elsewhere classified  Muscle weakness (generalized)     Problem List Patient Active Problem List   Diagnosis Date Noted  . Stroke (cerebrum) ([place])   . CVA (cerebral infarction) 02/07/2016   Alanson Puls, PT, DPT [place] S 02/29/2016, 5:32 PM  [place] MAIN [facility] SERVICES [address] [place],  [place], [address] Phone: [phone]   Fax:  [phone]  Name: Erin Yates MRN: [id number] Date of Birth: Dec 18, 1945

## 2016-03-02 ENCOUNTER — Ambulatory Visit: Payer: Commercial Managed Care - HMO | Admitting: Occupational Therapy

## 2016-03-02 ENCOUNTER — Encounter: Payer: Self-pay | Admitting: Physical Therapy

## 2016-03-03 ENCOUNTER — Ambulatory Visit: Payer: Commercial Managed Care - HMO | Admitting: Occupational Therapy

## 2016-03-06 ENCOUNTER — Ambulatory Visit: Payer: Commercial Managed Care - HMO | Admitting: Occupational Therapy

## 2016-03-06 ENCOUNTER — Ambulatory Visit: Payer: Commercial Managed Care - HMO | Attending: Neurology | Admitting: Physical Therapy

## 2016-03-08 ENCOUNTER — Ambulatory Visit: Payer: Commercial Managed Care - HMO | Admitting: Occupational Therapy

## 2016-03-08 ENCOUNTER — Ambulatory Visit: Payer: Commercial Managed Care - HMO | Admitting: Physical Therapy

## 2016-03-13 ENCOUNTER — Ambulatory Visit: Payer: Commercial Managed Care - HMO | Admitting: Physical Therapy

## 2016-03-13 ENCOUNTER — Ambulatory Visit: Payer: Commercial Managed Care - HMO | Admitting: Occupational Therapy

## 2016-03-15 ENCOUNTER — Ambulatory Visit: Payer: Commercial Managed Care - HMO | Admitting: Physical Therapy

## 2016-03-15 ENCOUNTER — Ambulatory Visit: Payer: Commercial Managed Care - HMO | Admitting: Occupational Therapy

## 2016-03-20 ENCOUNTER — Ambulatory Visit: Payer: Commercial Managed Care - HMO | Admitting: Physical Therapy

## 2016-03-20 ENCOUNTER — Ambulatory Visit: Payer: Commercial Managed Care - HMO | Admitting: Occupational Therapy

## 2016-03-22 ENCOUNTER — Ambulatory Visit: Payer: Commercial Managed Care - HMO | Admitting: Occupational Therapy

## 2016-03-23 ENCOUNTER — Encounter: Payer: Self-pay | Admitting: *Deleted

## 2016-03-23 ENCOUNTER — Ambulatory Visit (INDEPENDENT_AMBULATORY_CARE_PROVIDER_SITE_OTHER): Payer: Commercial Managed Care - HMO | Admitting: Internal Medicine

## 2016-03-23 ENCOUNTER — Encounter: Payer: Self-pay | Admitting: Internal Medicine

## 2016-03-23 VITALS — BP 136/66 | HR 53 | Ht 65.0 in | Wt 144.0 lb

## 2016-03-23 DIAGNOSIS — Q211 Atrial septal defect: Secondary | ICD-10-CM

## 2016-03-23 DIAGNOSIS — I159 Secondary hypertension, unspecified: Secondary | ICD-10-CM | POA: Diagnosis not present

## 2016-03-23 DIAGNOSIS — Q2112 Patent foramen ovale: Secondary | ICD-10-CM

## 2016-03-23 NOTE — Progress Notes (Addendum)
Patient Care Team: Adin Hector, MD as PCP - General (Internal Medicine)   HPI  Erin Yates is a 70 y.o. female Seen in the past by Dr. Carlyn Reichert because of palpitations in the context of prior closure of the PFO and cryptogenic stroke.  TEE 4/17 was normal with Amplatzer closure device in good position  She has had 6 strokes. She has lost vision with his most recent stroke.  She is anxious and depressed.   Records and Results Reviewed event recorder demonstrated no atrial fibrillation. The recommendation had been to proceed with ILR if 30 day monitor were negative   Past Medical History  Diagnosis Date  . Hypertension   . PFO (patent foramen ovale)     correction device placed 2010  . Dysrhythmia     prior to PFO correction  . Stroke [facility])     prior to PFO correction  . GERD (gastroesophageal reflux disease)   . Seasonal allergies   . Depression   . Diabetes mellitus without complication ([place])   . Hypercholesteremia   . Hypothyroidism   . Anxiety   . Right leg numbness     lower leg- residual from CVA  . 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 Anemia     past  hx  . Headache     before PFO closure    Past Surgical History  Procedure Laterality Date  . Abdominal hysterectomy    . Nasal sinus surgery    . Patent foramen ovale closure    . Foot neuroma surgery Left   . Cataract extraction w/phaco Right 09/01/2015    Procedure: CATARACT EXTRACTION PHACO AND INTRAOCULAR LENS PLACEMENT (IOC);  Surgeon: Leandrew Koyanagi, MD;  Location: [place];  Service: Ophthalmology;  Laterality: Right;  DIABETIC - oral meds  . Cataract extraction w/phaco Left 10/20/2015    Procedure: CATARACT EXTRACTION PHACO AND INTRAOCULAR LENS PLACEMENT (IOC);  Surgeon: Leandrew Koyanagi, MD;  Location: [place];  Service: Ophthalmology;  Laterality: Left;  DIABETIC - oral meds  . Tee without cardioversion N/A 02/09/2016    Procedure: TRANSESOPHAGEAL  ECHOCARDIOGRAM (TEE);  Surgeon: Adrian Prows, MD;  Location: [place];  Service: Cardiovascular;  Laterality: N/A;  . Cardiac catheterization      [facility]  . Patent foramen ovale closure      Current Outpatient Prescriptions  Medication Sig Dispense Refill  . ALPRAZolam (XANAX) 0.25 MG tablet Take 0.25 mg by mouth 2 (two) times daily as needed for anxiety.    Marland Kitchen amLODipine (NORVASC) 10 MG tablet Take 10 mg by mouth daily. PM    . atorvastatin (LIPITOR) 80 MG tablet Take 80 mg by mouth daily. PM    . buPROPion (WELLBUTRIN SR) 100 MG 12 hr tablet Take 100 mg by mouth 2 (two) times daily.    . ergocalciferol (VITAMIN D2) 50000 UNITS capsule Take 50,000 Units by mouth once a week.    . fluticasone (FLONASE) 50 MCG/ACT nasal spray Place into both nostrils daily as needed for allergies or rhinitis.    Marland Kitchen levothyroxine (SYNTHROID, LEVOTHROID) 50 MCG tablet Take 50 mcg by mouth daily before breakfast.    . losartan (COZAAR) 100 MG tablet Take 100 mg by mouth daily. AM    . metFORMIN (GLUCOPHAGE) 1000 MG tablet Take 1,000 mg by mouth 2 (two) times daily with a meal.    . metoprolol (TOPROL-XL) 200 MG 24 hr tablet Take 200  mg by mouth daily. AM    . pantoprazole (PROTONIX) 40 MG tablet Take 40 mg by mouth 2 (two) times daily.    Marland Kitchen venlafaxine XR (EFFEXOR-XR) 150 MG 24 hr capsule Take 150 mg by mouth daily with breakfast.    . vitamin B-12 (CYANOCOBALAMIN) 500 MCG tablet Take 500 mcg by mouth daily. AM    . zolpidem (AMBIEN) 5 MG tablet Take 5 mg by mouth at bedtime as needed for sleep.     No current facility-administered medications for this visit.    Allergies  Allergen Reactions  . Penicillins Rash    Also swelling and itching      Review of Systems negative except from HPI and PMH  Physical Exam BP 136/66 mmHg  Pulse 53  Ht 5\' 5"  (1.651 m)  Wt 144 lb (65.318 kg)  BMI 23.96 kg/m2 Well developed and well nourished in no acute distress HENT normal E scleral and icterus clear Neck  Supple JVP flat; carotids brisk and full Clear to ausculation  Regular rate and rhythm, no murmurs gallops or rub Soft with active bowel sounds No clubbing cyanosis  Edema Alert and oriented, grossly normal motor and sensory function Tearful  Skin Warm and Dry  Sinus rhythm at 53 Intervals 21/08/46 Axis (-35  Assessment and  Plan\ Cryptogenic stroke  Hypertension  Depression  Cigarette smoking   She's had multiple strokes; her Holter monitor was unrevealing. It is reasonable to discharge her to pursue implantable loop recorder based on the [employer] Trial.  We have reviewed the risks and benefits  She continues to smoke. We've talked about the value of lozenges and patches in conjunction. This led to a conversation regarding depression. She is tearful. Issue. I'll encourage her to follow-up with primary care physician and discuss next steps.  I wonder if there is any potential contribution from her metoprolol. Alternative antihypertensive therapy might be worth considering; I will look for Dr. Hardin Negus input as he knows her very well.

## 2016-03-23 NOTE — Patient Instructions (Signed)
Medication Instructions: - Your physician recommends that you continue on your current medications as directed. Please refer to the Current Medication list given to you today.  Labwork: - none  Procedures/Testing: - Your physician recommends that you have a loop recorder (LINQ) implant.  Follow-Up: - Your physician recommends that you schedule a follow-up appointment in: 10-14 days post procedure for a wound check with the device clinic in the [place] office.  Any Additional Special Instructions Will Be Listed Below (If Applicable).     If you need a refill on your cardiac medications before your next appointment, please call your pharmacy.

## 2016-03-27 ENCOUNTER — Ambulatory Visit: Payer: Commercial Managed Care - HMO | Admitting: Occupational Therapy

## 2016-03-27 ENCOUNTER — Ambulatory Visit (INDEPENDENT_AMBULATORY_CARE_PROVIDER_SITE_OTHER): Payer: Commercial Managed Care - HMO | Admitting: Neurology

## 2016-03-27 ENCOUNTER — Ambulatory Visit: Payer: Commercial Managed Care - HMO | Admitting: Physical Therapy

## 2016-03-27 ENCOUNTER — Encounter: Payer: Self-pay | Admitting: Neurology

## 2016-03-27 VITALS — BP 122/65 | HR 47 | Ht 65.0 in | Wt 145.4 lb

## 2016-03-27 DIAGNOSIS — I639 Cerebral infarction, unspecified: Secondary | ICD-10-CM | POA: Diagnosis not present

## 2016-03-27 MED ORDER — BUPROPION HCL ER (SR) 150 MG PO TB12: 150.0000 mg | ORAL_TABLET | Freq: Two times a day (BID) | ORAL | Status: AC

## 2016-03-27 NOTE — Patient Instructions (Addendum)
I had a long d/w patient and family about her recent  Cryptogenic stroke, risk for recurrent stroke/TIAs, personally independently reviewed imaging studies and stroke evaluation results and answered questions.Continue aspirin 325 mg daily  for secondary stroke prevention and maintain strict control of hypertension with blood pressure goal below 130/90, diabetes with hemoglobin A1c goal below 6.5% and lipids with LDL cholesterol goal below 70 mg/dL. I also advised the patient to eat a healthy diet with plenty of whole grains, cereals, fruits and vegetables, exercise regularly and maintain ideal body weight. She was encouraged to keep her appointment next week for loop recorder insertion to detect paroxysmal atrial fibrillation. I increased her dose of wellbutyrin SR to150 mg twice daily to help with her depression. She will continue on the current dose of Effexor .She was also advised to consider possible participation in the [place] trial and was given information to review at home and decide. Followup in the future with me in  6 months or earlier if necessary. Stroke Prevention Some medical conditions and behaviors are associated with an increased chance of having a stroke. You may prevent a stroke by making healthy choices and managing medical conditions. HOW CAN I REDUCE MY RISK OF HAVING A STROKE?   Stay physically active. Get at least 30 minutes of activity on most or all days.  Do not smoke. It may also be helpful to avoid exposure to secondhand smoke.  Limit alcohol use. Moderate alcohol use is considered to be:  No more than 2 drinks per day for men.  No more than 1 drink per day for nonpregnant women.  Eat healthy foods. This involves:  Eating 5 or more servings of fruits and vegetables a day.  Making dietary changes that address high blood pressure (hypertension), high cholesterol, diabetes, or obesity.  Manage your cholesterol levels.  Making food choices that are high in fiber  and low in saturated fat, trans fat, and cholesterol may control cholesterol levels.  Take any prescribed medicines to control cholesterol as directed by your health care provider.  Manage your diabetes.  Controlling your carbohydrate and sugar intake is recommended to manage diabetes.  Take any prescribed medicines to control diabetes as directed by your health care provider.  Control your hypertension.  Making food choices that are low in salt (sodium), saturated fat, trans fat, and cholesterol is recommended to manage hypertension.  Ask your health care provider if you need treatment to lower your blood pressure. Take any prescribed medicines to control hypertension as directed by your health care provider.  If you are 24-11 years of age, have your blood pressure checked every 3-5 years. If you are 59 years of age or older, have your blood pressure checked every year.  Maintain a healthy weight.  Reducing calorie intake and making food choices that are low in sodium, saturated fat, trans fat, and cholesterol are recommended to manage weight.  Stop drug abuse.  Avoid taking birth control pills.  Talk to your health care provider about the risks of taking birth control pills if you are over 87 years old, smoke, get migraines, or have ever had a blood clot.  Get evaluated for sleep disorders (sleep apnea).  Talk to your health care provider about getting a sleep evaluation if you snore a lot or have excessive sleepiness.  Take medicines only as directed by your health care provider.  For some people, aspirin or blood thinners (anticoagulants) are helpful in reducing the risk of forming abnormal blood  clots that can lead to stroke. If you have the irregular heart rhythm of atrial fibrillation, you should be on a blood thinner unless there is a good reason you cannot take them.  Understand all your medicine instructions.  Make sure that other conditions (such as anemia or  atherosclerosis) are addressed. SEEK IMMEDIATE MEDICAL CARE IF:   You have sudden weakness or numbness of the face, arm, or leg, especially on one side of the body.  Your face or eyelid droops to one side.  You have sudden confusion.  You have trouble speaking (aphasia) or understanding.  You have sudden trouble seeing in one or both eyes.  You have sudden trouble walking.  You have dizziness.  You have a loss of balance or coordination.  You have a sudden, severe headache with no known cause.  You have new chest pain or an irregular heartbeat. Any of these symptoms may represent a serious problem that is an emergency. Do not wait to see if the symptoms will go away. Get medical help at once. Call your local emergency services (911 in [place] Do not drive yourself to the hospital.   This information is not intended to replace advice given to you by your health care provider. Make sure you discuss any questions you have with your health care provider.   Document Released: 11/30/2004 Document Revised: 11/13/2014 Document Reviewed: 04/25/2013 Elsevier Interactive Patient Education [employer].

## 2016-03-27 NOTE — Progress Notes (Signed)
[facility] [address] [place]. [place] [address] [phone]       OFFICE FOLLOW-UP NOTE  [name] Date of Birth:  [birth year] Medical Record Number:  [id number]   HPI: 58 year caucasian lady seen for first office follow-up visit following hospital admission for stroke in April 2017. She is accompanied by husband. Erin Yates is an 70 y.o. female with a history of CVA and PFO closure who reports waking up normal today. At 0830 02/07/2016 ([facility]) had the acute onset of right sided headache and loss of vision, worse on the left. Patient reports some right peripheral vision loss as a consequence of a previous stroke. Has some numbness below the knee on her right leg as a consequence of a previous stroke as well. Initial NIHSS of 4. Patient on Plavix at home. MRI brain obtained and showed revealed acute small right parietal lobe and posterior temporal lobe infarcts. Tpa was started and patient was transported to [facility]: 1. She was admitted to the neuro ICU for further evaluation and treatment. MRI scan of the brain showed a right parietal, posterior temporoparietal embolic infarcts. Patient had a previous partial right temporal field vision loss from a prior stroke but she had new left homonymous hemianopsia from the current stroke is significantly impairing her vision. CT angiogram of the head and neck did not reveal any large vessel occlusion. 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 Transthoracic echo showed normal ejection fraction transesophageal echocardiogram showed PFO closure device in place without other cardiac source of embolism. Patient had an outpatient 30 day monitor which was negative for A. fib and loop recorder insertion is planned for May 30 by Dr. [name]. LDL cholesterol was 79 mg percent and 11 A1c was 6.4. Patient had previously been on aspirin and Plavix and was advised to discontinue Plavix and stay on  aspirin alone. She states she took aspirin but ran out of the prescription and did not refill. She complains of increasing depression even though she has been under 1300 twice daily and Effexor XR for 12 years. She feels she gets upset more easily and cries a lot. She admits to mild short-term memory difficulties since her stroke. She's been careful with her walking but has had no falls or injuries.   ROS:   14 system review of systems is positive for  blurred vision, numbness, weakness, depression, snoring and all other systems negative  PMH:  Past Medical History  Diagnosis Date  . Hypertension   . PFO (patent foramen ovale)     correction device placed 2010  . Dysrhythmia     prior to PFO correction  . Stroke [facility])     prior to PFO correction  . GERD (gastroesophageal reflux disease)   . Seasonal allergies   . Depression   . Diabetes mellitus without complication ([place])   . Hypercholesteremia   . Hypothyroidism   . Anxiety   . Right leg numbness     lower leg- residual from CVA  . 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 Anemia     past  hx  . Headache     before PFO closure    Social History:  Social History   Social History  . Marital Status: Married    Spouse Name: N/A  . Number of Children: N/A  . Years of Education: N/A   Occupational History  . Not  on file.   Social History Main Topics  . Smoking status: Current Every Day Smoker -- 0.50 packs/day for 25 years  . Smokeless tobacco: Not on file  . Alcohol Use: No  . Drug Use: No  . Sexual Activity: Not on file   Other Topics Concern  . Not on file   Social History Narrative    Medications:   Current Outpatient Prescriptions on File Prior to Visit  Medication Sig Dispense Refill  . ALPRAZolam (XANAX) 0.25 MG tablet Take 0.25 mg by mouth 2 (two) times daily as needed for anxiety.    Marland Kitchen amLODipine (NORVASC) 10 MG tablet Take 10 mg by mouth daily. PM    . atorvastatin (LIPITOR) 80 MG tablet Take  80 mg by mouth daily. PM    . ergocalciferol (VITAMIN D2) 50000 UNITS capsule Take 50,000 Units by mouth once a week.    . fluticasone (FLONASE) 50 MCG/ACT nasal spray Place into both nostrils daily as needed for allergies or rhinitis.    Marland Kitchen levothyroxine (SYNTHROID, LEVOTHROID) 50 MCG tablet Take 50 mcg by mouth daily before breakfast.    . losartan (COZAAR) 100 MG tablet Take 100 mg by mouth daily. AM    . metFORMIN (GLUCOPHAGE) 1000 MG tablet Take 1,000 mg by mouth 2 (two) times daily with a meal.    . metoprolol (TOPROL-XL) 200 MG 24 hr tablet Take 200 mg by mouth daily. AM    . pantoprazole (PROTONIX) 40 MG tablet Take 40 mg by mouth 2 (two) times daily.    Marland Kitchen venlafaxine XR (EFFEXOR-XR) 150 MG 24 hr capsule Take 150 mg by mouth daily with breakfast.    . vitamin B-12 (CYANOCOBALAMIN) 500 MCG tablet Take 500 mcg by mouth daily. AM    . zolpidem (AMBIEN) 5 MG tablet Take 5 mg by mouth at bedtime as needed for sleep.     No current facility-administered medications on file prior to visit.    Allergies:   Allergies  Allergen Reactions  . Penicillins Rash    Also swelling and itching    Physical Exam General: frail middle aged Caucasian lady seated, in no evident distress Head: head normocephalic and atraumatic.  Neck: supple with no carotid or supraclavicular bruits Cardiovascular: regular rate and rhythm, no murmurs Musculoskeletal: no deformity Skin:  no rash/petichiae Vascular:  Normal pulses all extremities Filed Vitals:   03/27/16 1406  BP: 122/65  Pulse: 47   Neurologic Exam Mental Status: Awake and fully alert. Oriented to place and time. Recent and remote memory intact. Attention span, concentration and fund of knowledge appropriate. Mood and affect appropriate.  Cranial Nerves: Fundoscopic exam reveals sharp disc margins. Pupils equal, briskly reactive to light. Extraocular movements full without nystagmus. Visual fields  Show partial left homonymous hemianopsia and  diminished vision in the right eye lower quadrant to confrontation. Hearing intact. Facial sensation intact. Face, tongue, palate moves normally and symmetrically.  Motor: Normal bulk and tone. Normal strength in all tested extremity muscles. Sensory.: intact to touch ,pinprick .position and vibratory sensation.  Coordination: Rapid alternating movements normal in all extremities. Finger-to-nose and heel-to-shin performed accurately bilaterally. Gait and Station: Arises from chair without difficulty. Stance is normal. Gait demonstrates normal stride length and balance . Not able to heel, toe and tandem walk without difficulty.  Reflexes: 1+ and symmetric. Toes downgoing.   NIHSS  2 Modified [place]  3   ASSESSMENT: 79 year Caucasian lady with embolic right parietal and posterior temporal infarcts in April 2017  of cryptogenic etiology. Prior history of multiple strokes. Patent foramen ovale  status post endovascular closure in 2009, hypertension, hyperlipidemia, diabetes, remote smoking    PLAN: I had a long d/w patient and family about her recent  Cryptogenic stroke, risk for recurrent stroke/TIAs, personally independently reviewed imaging studies and stroke evaluation results and answered questions.Continue aspirin 325 mg daily  for secondary stroke prevention and maintain strict control of hypertension with blood pressure goal below 130/90, diabetes with hemoglobin A1c goal below 6.5% and lipids with LDL cholesterol goal below 70 mg/dL. I also advised the patient to eat a healthy diet with plenty of whole grains, cereals, fruits and vegetables, exercise regularly and maintain ideal body weight. She was encouraged to keep her appointment next week for loop recorder insertion to detect paroxysmal atrial fibrillation. I increased her dose of wellbutyrin SR to150 mg twice daily to help with her depression. She will continue on the current dose of Effexor .She was also advised to consider possible  participation in the [place] trial and was given information to review at home and decide. Followup in the future with me in  6 months or earlier if necessary. Greater than 50% of time during this 25 minute visit was spent on counseling,explanation of diagnosis, planning of further management, discussion with patient and family and coordination of care Antony Contras, MD Note: This document was prepared with digital dictation and possible smart phrase technology. Any transcriptional errors that result from this process are unintentional

## 2016-03-29 ENCOUNTER — Ambulatory Visit: Payer: Commercial Managed Care - HMO | Admitting: Occupational Therapy

## 2016-03-29 ENCOUNTER — Ambulatory Visit: Payer: Commercial Managed Care - HMO | Admitting: Physical Therapy

## 2016-04-04 ENCOUNTER — Encounter: Payer: Self-pay | Admitting: *Deleted

## 2016-04-04 ENCOUNTER — Ambulatory Visit
Admission: RE | Admit: 2016-04-04 | Discharge: 2016-04-04 | Disposition: A | Discharge status: Home / Self Care | Payer: Commercial Managed Care - HMO | Source: Ambulatory Visit | Attending: Internal Medicine | Admitting: Internal Medicine

## 2016-04-04 ENCOUNTER — Encounter
Admission: RE | Disposition: A | Discharge status: Home / Self Care | Payer: Self-pay | Source: Ambulatory Visit | Attending: Internal Medicine

## 2016-04-04 DIAGNOSIS — Z9842 Cataract extraction status, left eye: Secondary | ICD-10-CM | POA: Diagnosis not present

## 2016-04-04 DIAGNOSIS — I639 Cerebral infarction, unspecified: Secondary | ICD-10-CM | POA: Diagnosis not present

## 2016-04-04 DIAGNOSIS — Z8673 Personal history of transient ischemic attack (TIA), and cerebral infarction without residual deficits: Secondary | ICD-10-CM | POA: Diagnosis not present

## 2016-04-04 DIAGNOSIS — E119 Type 2 diabetes mellitus without complications: Secondary | ICD-10-CM | POA: Diagnosis not present

## 2016-04-04 DIAGNOSIS — R51 Headache: Secondary | ICD-10-CM | POA: Diagnosis not present

## 2016-04-04 DIAGNOSIS — R002 Palpitations: Secondary | ICD-10-CM | POA: Diagnosis not present

## 2016-04-04 DIAGNOSIS — I499 Cardiac arrhythmia, unspecified: Secondary | ICD-10-CM | POA: Insufficient documentation

## 2016-04-04 DIAGNOSIS — H547 Unspecified visual loss: Secondary | ICD-10-CM | POA: Diagnosis not present

## 2016-04-04 DIAGNOSIS — Z7984 Long term (current) use of oral hypoglycemic drugs: Secondary | ICD-10-CM | POA: Insufficient documentation

## 2016-04-04 DIAGNOSIS — F329 Major depressive disorder, single episode, unspecified: Secondary | ICD-10-CM | POA: Diagnosis not present

## 2016-04-04 DIAGNOSIS — Z9841 Cataract extraction status, right eye: Secondary | ICD-10-CM | POA: Diagnosis not present

## 2016-04-04 DIAGNOSIS — E7800 Pure hypercholesterolemia, unspecified: Secondary | ICD-10-CM | POA: Diagnosis not present

## 2016-04-04 DIAGNOSIS — Z90710 Acquired absence of both cervix and uterus: Secondary | ICD-10-CM | POA: Diagnosis not present

## 2016-04-04 DIAGNOSIS — Z79899 Other long term (current) drug therapy: Secondary | ICD-10-CM | POA: Insufficient documentation

## 2016-04-04 DIAGNOSIS — K219 Gastro-esophageal reflux disease without esophagitis: Secondary | ICD-10-CM | POA: Insufficient documentation

## 2016-04-04 DIAGNOSIS — R200 Anesthesia of skin: Secondary | ICD-10-CM | POA: Diagnosis not present

## 2016-04-04 DIAGNOSIS — I10 Essential (primary) hypertension: Secondary | ICD-10-CM | POA: Diagnosis not present

## 2016-04-04 DIAGNOSIS — Z880 Allergy status to penicillin: Secondary | ICD-10-CM | POA: Insufficient documentation

## 2016-04-04 DIAGNOSIS — F419 Anxiety disorder, unspecified: Secondary | ICD-10-CM | POA: Diagnosis not present

## 2016-04-04 DIAGNOSIS — E039 Hypothyroidism, unspecified: Secondary | ICD-10-CM | POA: Diagnosis not present

## 2016-04-04 DIAGNOSIS — F17210 Nicotine dependence, cigarettes, uncomplicated: Secondary | ICD-10-CM | POA: Insufficient documentation

## 2016-04-04 HISTORY — PX: EP IMPLANTABLE DEVICE: SHX172B

## 2016-04-04 SURGERY — LOOP RECORDER INSERTION
Anesthesia: Moderate Sedation

## 2016-04-04 MED ORDER — LIDOCAINE-EPINEPHRINE (PF) 1 %-1:200000 IJ SOLN
INTRAMUSCULAR | Status: AC
  Filled 2016-04-04: qty 30

## 2016-04-04 SURGICAL SUPPLY — 2 items
LOOP REVEAL LINQSYS (Prosthesis & Implant Heart) ×3 IMPLANT
PACK LOOP INSERTION (CUSTOM PROCEDURE TRAY) ×3 IMPLANT

## 2016-04-04 NOTE — Discharge Instructions (Signed)
Cardiac Event Monitoring A cardiac event monitor is a small recording device used to help detect abnormal heart rhythms (arrhythmias). The monitor is used to record heart rhythm when noticeable symptoms such as the following occur:  Fast heartbeats (palpitations), such as heart racing or fluttering.  Dizziness.  Fainting or light-headedness.  Unexplained weakness. The monitor is wired to two electrodes placed on your chest. Electrodes are flat, sticky disks that attach to your skin. The monitor can be worn for up to 30 days. You will wear the monitor at all times, except when bathing.  HOW TO USE YOUR CARDIAC EVENT MONITOR A technician will prepare your chest for the electrode placement. The technician will show you how to place the electrodes, how to work the monitor, and how to replace the batteries. Take time to practice using the monitor before you leave the office. Make sure you understand how to send the information from the monitor to your health care provider. This requires a telephone with a landline, not a cell phone. You need to:  Wear your monitor at all times, except when you are in water:  Do not get the monitor wet.  Take the monitor off when bathing. Do not swim or use a hot tub with it on.  Keep your skin clean. Do not put body lotion or moisturizer on your chest.  Change the electrodes daily or any time they stop sticking to your skin. You might need to use tape to keep them on.  It is possible that your skin under the electrodes could become irritated. To keep this from happening, try to put the electrodes in slightly different places on your chest. However, they must remain in the area under your left breast and in the upper right section of your chest.  Make sure the monitor is safely clipped to your clothing or in a location close to your body that your health care provider recommends.  Press the button to record when you feel symptoms of heart trouble, such as  dizziness, weakness, light-headedness, palpitations, thumping, shortness of breath, unexplained weakness, or a fluttering or racing heart. The monitor is always on and records what happened slightly before you pressed the button, so do not worry about being too late to get good information.  Keep a diary of your activities, such as walking, doing chores, and taking medicine. It is especially important to note what you were doing when you pushed the button to record your symptoms. This will help your health care provider determine what might be contributing to your symptoms. The information stored in your monitor will be reviewed by your health care provider alongside your diary entries.  Send the recorded information as recommended by your health care provider. It is important to understand that it will take some time for your health care provider to process the results.  Change the batteries as recommended by your health care provider. SEEK IMMEDIATE MEDICAL CARE IF:   You have chest pain.  You have extreme difficulty breathing or shortness of breath.  You develop a very fast heartbeat that persists.  You develop dizziness that does not go away.  You faint or constantly feel you are about to faint.   This information is not intended to replace advice given to you by your health care provider. Make sure you discuss any questions you have with your health care provider.   Document Released: 08/01/2008 Document Revised: 11/13/2014 Document Reviewed: 04/21/2013 Elsevier Interactive Patient Education [employer].

## 2016-04-04 NOTE — H&P (View-Only) (Signed)
Patient Care Team: Adin Hector, MD as PCP - General (Internal Medicine)   HPI  Erin Yates is a 70 y.o. female Seen in the past by Dr. Carlyn Reichert because of palpitations in the context of prior closure of the PFO and cryptogenic stroke.  TEE 4/17 was normal with Amplatzer closure device in good position  She has had 6 strokes. She has lost vision with his most recent stroke.  She is anxious and depressed.   Records and Results Reviewed event recorder demonstrated no atrial fibrillation. The recommendation had been to proceed with ILR if 30 day monitor were negative   Past Medical History  Diagnosis Date  . Hypertension   . PFO (patent foramen ovale)     correction device placed 2010  . Dysrhythmia     prior to PFO correction  . Stroke [facility])     prior to PFO correction  . GERD (gastroesophageal reflux disease)   . Seasonal allergies   . Depression   . Diabetes mellitus without complication ([place])   . Hypercholesteremia   . Hypothyroidism   . Anxiety   . Right leg numbness     lower leg- residual from CVA  . 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 Anemia     past  hx  . Headache     before PFO closure    Past Surgical History  Procedure Laterality Date  . Abdominal hysterectomy    . Nasal sinus surgery    . Patent foramen ovale closure    . Foot neuroma surgery Left   . Cataract extraction w/phaco Right 09/01/2015    Procedure: CATARACT EXTRACTION PHACO AND INTRAOCULAR LENS PLACEMENT (IOC);  Surgeon: Leandrew Koyanagi, MD;  Location: [facility];  Service: Ophthalmology;  Laterality: Right;  DIABETIC - oral meds  . Cataract extraction w/phaco Left 10/20/2015    Procedure: CATARACT EXTRACTION PHACO AND INTRAOCULAR LENS PLACEMENT (IOC);  Surgeon: Leandrew Koyanagi, MD;  Location: [facility];  Service: Ophthalmology;  Laterality: Left;  DIABETIC - oral meds  . Tee without cardioversion N/A 02/09/2016    Procedure: TRANSESOPHAGEAL  ECHOCARDIOGRAM (TEE);  Surgeon: Adrian Prows, MD;  Location: [facility];  Service: Cardiovascular;  Laterality: N/A;  . Cardiac catheterization      [facility]  . Patent foramen ovale closure      Current Outpatient Prescriptions  Medication Sig Dispense Refill  . ALPRAZolam (XANAX) 0.25 MG tablet Take 0.25 mg by mouth 2 (two) times daily as needed for anxiety.    Marland Kitchen amLODipine (NORVASC) 10 MG tablet Take 10 mg by mouth daily. PM    . atorvastatin (LIPITOR) 80 MG tablet Take 80 mg by mouth daily. PM    . buPROPion (WELLBUTRIN SR) 100 MG 12 hr tablet Take 100 mg by mouth 2 (two) times daily.    . ergocalciferol (VITAMIN D2) 50000 UNITS capsule Take 50,000 Units by mouth once a week.    . fluticasone (FLONASE) 50 MCG/ACT nasal spray Place into both nostrils daily as needed for allergies or rhinitis.    Marland Kitchen levothyroxine (SYNTHROID, LEVOTHROID) 50 MCG tablet Take 50 mcg by mouth daily before breakfast.    . losartan (COZAAR) 100 MG tablet Take 100 mg by mouth daily. AM    . metFORMIN (GLUCOPHAGE) 1000 MG tablet Take 1,000 mg by mouth 2 (two) times daily with a meal.    . metoprolol (TOPROL-XL) 200 MG 24 hr tablet Take 200  mg by mouth daily. AM    . pantoprazole (PROTONIX) 40 MG tablet Take 40 mg by mouth 2 (two) times daily.    Marland Kitchen venlafaxine XR (EFFEXOR-XR) 150 MG 24 hr capsule Take 150 mg by mouth daily with breakfast.    . vitamin B-12 (CYANOCOBALAMIN) 500 MCG tablet Take 500 mcg by mouth daily. AM    . zolpidem (AMBIEN) 5 MG tablet Take 5 mg by mouth at bedtime as needed for sleep.     No current facility-administered medications for this visit.    Allergies  Allergen Reactions  . Penicillins Rash    Also swelling and itching      Review of Systems negative except from HPI and PMH  Physical Exam BP 136/66 mmHg  Pulse 53  Ht 5\' 5"  (1.651 m)  Wt 144 lb (65.318 kg)  BMI 23.96 kg/m2 Well developed and well nourished in no acute distress HENT normal E scleral and icterus clear Neck  Supple JVP flat; carotids brisk and full Clear to ausculation  Regular rate and rhythm, no murmurs gallops or rub Soft with active bowel sounds No clubbing cyanosis  Edema Alert and oriented, grossly normal motor and sensory function Tearful  Skin Warm and Dry  Sinus rhythm at 53 Intervals 21/08/46 Axis (-35  Assessment and  Plan\ Cryptogenic stroke  Hypertension  Depression  Cigarette smoking   She's had multiple strokes; her Holter monitor was unrevealing. It is reasonable to discharge her to pursue implantable loop recorder based on the [place] AF Trial.  We have reviewed the risks and benefits  She continues to smoke. We've talked about the value of lozenges and patches in conjunction. This led to a conversation regarding depression. She is tearful. Issue. I'll encourage her to follow-up with primary care physician and discuss next steps.  I wonder if there is any potential contribution from her metoprolol. Alternative antihypertensive therapy might be worth considering; I will look for Dr. Hardin Negus input as he knows her very well.

## 2016-04-04 NOTE — OR Nursing (Signed)
Kamrar Rep doing device teaching with patient and husband

## 2016-04-04 NOTE — Interval H&P Note (Signed)
History and Physical Interval Note:  04/04/2016 8:31 AM  Erin Yates  has presented today for surgery, with the diagnosis of Cryptogenic stroke      Bedside  The various methods of treatment have been discussed with the patient and family. After consideration of risks, benefits and other options for treatment, the patient has consented to  Procedure(s): Loop Recorder Insertion (N/A) as a surgical intervention .  The patient's history has been reviewed, patient examined, no change in status, stable for surgery.  I have reviewed the patient's chart and labs.  Questions were answered to the patient's satisfaction.     Virl Axe

## 2016-04-05 ENCOUNTER — Encounter: Payer: Self-pay | Admitting: Occupational Therapy

## 2016-04-05 ENCOUNTER — Ambulatory Visit: Payer: Self-pay | Admitting: Physical Therapy

## 2016-04-07 ENCOUNTER — Telehealth: Payer: Self-pay | Admitting: Internal Medicine

## 2016-04-07 NOTE — Telephone Encounter (Signed)
I informed Erin Yates that she can take the outer bandage (tegaderm/gauze) off but to leave the steri-strips in place. She denies redness, swelling, drainage or active bleeding. I confirmed her wound check for 04/17/16.

## 2016-04-07 NOTE — Telephone Encounter (Signed)
New message      Pt had a loop recorder put in on tues.  Can she change the bandage?  It is bloody?

## 2016-04-10 DIAGNOSIS — I739 Peripheral vascular disease, unspecified: Secondary | ICD-10-CM | POA: Diagnosis not present

## 2016-04-10 DIAGNOSIS — E1159 Type 2 diabetes mellitus with other circulatory complications: Secondary | ICD-10-CM | POA: Diagnosis not present

## 2016-04-10 DIAGNOSIS — E784 Other hyperlipidemia: Secondary | ICD-10-CM | POA: Diagnosis not present

## 2016-04-10 DIAGNOSIS — D519 Vitamin B12 deficiency anemia, unspecified: Secondary | ICD-10-CM | POA: Diagnosis not present

## 2016-04-10 DIAGNOSIS — F418 Other specified anxiety disorders: Secondary | ICD-10-CM | POA: Diagnosis not present

## 2016-04-10 DIAGNOSIS — I10 Essential (primary) hypertension: Secondary | ICD-10-CM | POA: Diagnosis not present

## 2016-04-10 DIAGNOSIS — F3342 Major depressive disorder, recurrent, in full remission: Secondary | ICD-10-CM | POA: Diagnosis not present

## 2016-04-17 ENCOUNTER — Encounter: Payer: Self-pay | Admitting: Internal Medicine

## 2016-04-17 ENCOUNTER — Ambulatory Visit (INDEPENDENT_AMBULATORY_CARE_PROVIDER_SITE_OTHER): Payer: Commercial Managed Care - HMO | Admitting: *Deleted

## 2016-04-17 DIAGNOSIS — I639 Cerebral infarction, unspecified: Secondary | ICD-10-CM

## 2016-04-17 LAB — CUP PACEART INCLINIC DEVICE CHECK: MDC IDC SESS DTM: 20170612093028

## 2016-04-17 NOTE — Progress Notes (Signed)
ILR wound check appointment. Steri-strips removed. Wound without redness or edema. Incision edges approximated, wound well healed. Normal device function. Battery status: good. R-waves 0.37mV. No symptom, tachy, pause, brady or AF episodes. Pause and brady detection programmed off as patient was implanted for cryptogenic stroke. Monthly summary reports and ROV with SK PRN.

## 2016-05-04 ENCOUNTER — Ambulatory Visit (INDEPENDENT_AMBULATORY_CARE_PROVIDER_SITE_OTHER): Payer: Commercial Managed Care - HMO | Admitting: *Deleted

## 2016-05-04 DIAGNOSIS — I639 Cerebral infarction, unspecified: Secondary | ICD-10-CM | POA: Diagnosis not present

## 2016-05-05 NOTE — Progress Notes (Signed)
Carelink Summary Report / Loop Recorder 

## 2016-05-19 ENCOUNTER — Other Ambulatory Visit: Payer: Self-pay

## 2016-05-24 DIAGNOSIS — E1129 Type 2 diabetes mellitus with other diabetic kidney complication: Secondary | ICD-10-CM | POA: Diagnosis not present

## 2016-05-24 DIAGNOSIS — I10 Essential (primary) hypertension: Secondary | ICD-10-CM | POA: Diagnosis not present

## 2016-05-24 DIAGNOSIS — E1165 Type 2 diabetes mellitus with hyperglycemia: Secondary | ICD-10-CM | POA: Diagnosis not present

## 2016-05-24 DIAGNOSIS — E538 Deficiency of other specified B group vitamins: Secondary | ICD-10-CM | POA: Diagnosis not present

## 2016-05-24 DIAGNOSIS — R809 Proteinuria, unspecified: Secondary | ICD-10-CM | POA: Diagnosis not present

## 2016-05-26 LAB — CUP PACEART REMOTE DEVICE CHECK: MDC IDC SESS DTM: 20170629123523

## 2016-05-29 DIAGNOSIS — H53462 Homonymous bilateral field defects, left side: Secondary | ICD-10-CM | POA: Diagnosis not present

## 2016-05-31 ENCOUNTER — Other Ambulatory Visit: Payer: Self-pay | Admitting: Internal Medicine

## 2016-05-31 DIAGNOSIS — I38 Endocarditis, valve unspecified: Secondary | ICD-10-CM | POA: Diagnosis not present

## 2016-05-31 DIAGNOSIS — E034 Atrophy of thyroid (acquired): Secondary | ICD-10-CM | POA: Diagnosis not present

## 2016-05-31 DIAGNOSIS — D519 Vitamin B12 deficiency anemia, unspecified: Secondary | ICD-10-CM | POA: Diagnosis not present

## 2016-05-31 DIAGNOSIS — F3342 Major depressive disorder, recurrent, in full remission: Secondary | ICD-10-CM | POA: Diagnosis not present

## 2016-05-31 DIAGNOSIS — I10 Essential (primary) hypertension: Secondary | ICD-10-CM | POA: Diagnosis not present

## 2016-05-31 DIAGNOSIS — Z1231 Encounter for screening mammogram for malignant neoplasm of breast: Secondary | ICD-10-CM

## 2016-05-31 DIAGNOSIS — E1129 Type 2 diabetes mellitus with other diabetic kidney complication: Secondary | ICD-10-CM | POA: Diagnosis not present

## 2016-05-31 DIAGNOSIS — E784 Other hyperlipidemia: Secondary | ICD-10-CM | POA: Diagnosis not present

## 2016-05-31 DIAGNOSIS — I739 Peripheral vascular disease, unspecified: Secondary | ICD-10-CM | POA: Diagnosis not present

## 2016-05-31 DIAGNOSIS — K219 Gastro-esophageal reflux disease without esophagitis: Secondary | ICD-10-CM | POA: Diagnosis not present

## 2016-06-05 ENCOUNTER — Ambulatory Visit (INDEPENDENT_AMBULATORY_CARE_PROVIDER_SITE_OTHER): Payer: Commercial Managed Care - HMO | Admitting: *Deleted

## 2016-06-05 DIAGNOSIS — I639 Cerebral infarction, unspecified: Secondary | ICD-10-CM

## 2016-06-05 NOTE — Progress Notes (Signed)
Carelink Summary Report / Loop Recorder 

## 2016-06-10 ENCOUNTER — Encounter: Payer: Self-pay | Admitting: Internal Medicine

## 2016-06-12 LAB — CUP PACEART REMOTE DEVICE CHECK: MDC IDC SESS DTM: 20170729133527

## 2016-06-13 ENCOUNTER — Telehealth: Payer: Self-pay | Admitting: *Deleted

## 2016-06-13 NOTE — Telephone Encounter (Signed)
Reviewed episode with Dr. [name]- he recommends following up with Roderic Palau, NP in the AFib Clinic to discuss NOAC for 28 min episode of AF 06/09/16. Scheduled for AF Clinic 06/14/16 at 9:30am. Ms. Alloy is agreeable.

## 2016-06-13 NOTE — Telephone Encounter (Signed)
Erin Yates was made aware of episodes on LINQ showing increased HR 06/09/16. She said that Friday afternoon she was resting on the couch due to a stomach bug. She felt her heart racing, skipping beats, and that she was uncomfortable. She denies missing doses of any medications. I will review these episodes with Dr. [name] and call Ms. Kimble back with any recommendations. She is agreeable.

## 2016-06-14 ENCOUNTER — Encounter (HOSPITAL_COMMUNITY): Payer: Self-pay | Admitting: Nurse Practitioner

## 2016-06-14 ENCOUNTER — Ambulatory Visit (HOSPITAL_COMMUNITY)
Admission: RE | Admit: 2016-06-14 | Discharge: 2016-06-14 | Disposition: A | Discharge status: Home / Self Care | Payer: Commercial Managed Care - HMO | Source: Ambulatory Visit | Attending: Nurse Practitioner | Admitting: Nurse Practitioner

## 2016-06-14 VITALS — BP 122/64 | Ht 65.0 in | Wt 143.2 lb

## 2016-06-14 DIAGNOSIS — I10 Essential (primary) hypertension: Secondary | ICD-10-CM | POA: Diagnosis not present

## 2016-06-14 DIAGNOSIS — I440 Atrioventricular block, first degree: Secondary | ICD-10-CM | POA: Insufficient documentation

## 2016-06-14 DIAGNOSIS — Z7901 Long term (current) use of anticoagulants: Secondary | ICD-10-CM | POA: Diagnosis not present

## 2016-06-14 DIAGNOSIS — K219 Gastro-esophageal reflux disease without esophagitis: Secondary | ICD-10-CM | POA: Insufficient documentation

## 2016-06-14 DIAGNOSIS — F17210 Nicotine dependence, cigarettes, uncomplicated: Secondary | ICD-10-CM | POA: Diagnosis not present

## 2016-06-14 DIAGNOSIS — F419 Anxiety disorder, unspecified: Secondary | ICD-10-CM | POA: Diagnosis not present

## 2016-06-14 DIAGNOSIS — Z803 Family history of malignant neoplasm of breast: Secondary | ICD-10-CM | POA: Diagnosis not present

## 2016-06-14 DIAGNOSIS — Z7982 Long term (current) use of aspirin: Secondary | ICD-10-CM | POA: Insufficient documentation

## 2016-06-14 DIAGNOSIS — F329 Major depressive disorder, single episode, unspecified: Secondary | ICD-10-CM | POA: Diagnosis not present

## 2016-06-14 DIAGNOSIS — Z7984 Long term (current) use of oral hypoglycemic drugs: Secondary | ICD-10-CM | POA: Diagnosis not present

## 2016-06-14 DIAGNOSIS — Z79899 Other long term (current) drug therapy: Secondary | ICD-10-CM | POA: Insufficient documentation

## 2016-06-14 DIAGNOSIS — I480 Paroxysmal atrial fibrillation: Secondary | ICD-10-CM | POA: Diagnosis not present

## 2016-06-14 DIAGNOSIS — R001 Bradycardia, unspecified: Secondary | ICD-10-CM | POA: Insufficient documentation

## 2016-06-14 DIAGNOSIS — Z9889 Other specified postprocedural states: Secondary | ICD-10-CM | POA: Diagnosis not present

## 2016-06-14 DIAGNOSIS — E7800 Pure hypercholesterolemia, unspecified: Secondary | ICD-10-CM | POA: Insufficient documentation

## 2016-06-14 DIAGNOSIS — Z8673 Personal history of transient ischemic attack (TIA), and cerebral infarction without residual deficits: Secondary | ICD-10-CM | POA: Insufficient documentation

## 2016-06-14 DIAGNOSIS — I481 Persistent atrial fibrillation: Secondary | ICD-10-CM | POA: Insufficient documentation

## 2016-06-14 DIAGNOSIS — E039 Hypothyroidism, unspecified: Secondary | ICD-10-CM | POA: Insufficient documentation

## 2016-06-14 DIAGNOSIS — E119 Type 2 diabetes mellitus without complications: Secondary | ICD-10-CM | POA: Insufficient documentation

## 2016-06-14 DIAGNOSIS — Z818 Family history of other mental and behavioral disorders: Secondary | ICD-10-CM | POA: Diagnosis not present

## 2016-06-14 DIAGNOSIS — Z90710 Acquired absence of both cervix and uterus: Secondary | ICD-10-CM | POA: Insufficient documentation

## 2016-06-14 DIAGNOSIS — I4891 Unspecified atrial fibrillation: Secondary | ICD-10-CM | POA: Diagnosis present

## 2016-06-14 MED ORDER — APIXABAN 5 MG PO TABS: 5.0000 mg | ORAL_TABLET | Freq: Two times a day (BID) | ORAL | 0 refills | Status: DC

## 2016-06-14 MED ORDER — DILTIAZEM HCL 30 MG PO TABS: ORAL_TABLET | ORAL | 1 refills | Status: DC

## 2016-06-14 MED ORDER — APIXABAN 5 MG PO TABS
5.0000 mg | ORAL_TABLET | Freq: Two times a day (BID) | ORAL | 3 refills | Status: DC
Start: 2016-06-14 — End: 2018-03-05

## 2016-06-14 NOTE — Patient Instructions (Signed)
Your physician has recommended you make the following change in your medication:  1)Eliquis 5mg  twice a day 2)Cardizem 30mg  -- take 1 tablet every 4 hours by mouth AS NEEDED for heart rate >100 as long as blood pressure >100. 3)Donna will be in touch with you regarding aspirin after she speaks with Dr. Ramonita Lab  Scheduler will be in touch with you regarding sleep study.

## 2016-06-14 NOTE — Progress Notes (Signed)
Patient ID: Erin Yates, female   DOB: 1946/07/11, 70 y.o.   MRN: [id number]   Primary Care Physician: Adin Hector, MD Referring Physician:Dr. [name], Device clinic   Erin Yates is a 70 y.o. female with a h/o tobacco abuse, DM, HTN, hypothyroidism, CVA x 6 in the past as well as a remote PFO closure. Her last cryptogenic stroke was in April of this year and a LinQ monitor was placed 5/30. Afib was identified 8/4 which showed 30 mins of afib, from which pt was aware of the irregular heart beat. She is on 200 mg of metoprolol daily. She admits to terrible snoring to the point that the husband does not sleep with her any longer, daytime somnolence.. She states that she had a sleep study years ago, study was cut short for some reason, but she states the test was negative. No alcohol or excessive caffeine.  Today, she denies symptoms of chest pain, shortness of breath, orthopnea, PND, lower extremity edema, dizziness, presyncope, syncope, or neurologic sequela. The patient is tolerating medications without difficulties and is otherwise without complaint today.   Past Medical History:  Diagnosis Date  . Anemia    past  hx  . Anxiety   . Depression   . Diabetes mellitus without complication ([place])   . Dysrhythmia    prior to PFO correction  . GERD (gastroesophageal reflux disease)   . Headache    before PFO closure  . Hypercholesteremia   . Hypertension   . Hypothyroidism   . 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 PFO (patent foramen ovale)    correction device placed 2010  . Right leg numbness    lower leg- residual from CVA  . Seasonal allergies   . Stroke [facility])    prior to PFO correction   Past Surgical History:  Procedure Laterality Date  . ABDOMINAL HYSTERECTOMY    . CARDIAC CATHETERIZATION     [facility]  . CATARACT EXTRACTION W/PHACO Right 09/01/2015   Procedure: CATARACT EXTRACTION PHACO AND INTRAOCULAR LENS PLACEMENT (IOC);  Surgeon: Leandrew Koyanagi, MD;   Location: [place];  Service: Ophthalmology;  Laterality: Right;  DIABETIC - oral meds  . CATARACT EXTRACTION W/PHACO Left 10/20/2015   Procedure: CATARACT EXTRACTION PHACO AND INTRAOCULAR LENS PLACEMENT (IOC);  Surgeon: Leandrew Koyanagi, MD;  Location: [place];  Service: Ophthalmology;  Laterality: Left;  DIABETIC - oral meds  . EP IMPLANTABLE DEVICE N/A 04/04/2016   Procedure: Loop Recorder Insertion;  Surgeon: Deboraha Sprang, MD;  Location: [place] CV LAB;  Service: Cardiovascular;  Laterality: N/A;  . FOOT NEUROMA SURGERY Left   . NASAL SINUS SURGERY    . PATENT FORAMEN OVALE CLOSURE    . PATENT FORAMEN OVALE CLOSURE    . TEE WITHOUT CARDIOVERSION N/A 02/09/2016   Procedure: TRANSESOPHAGEAL ECHOCARDIOGRAM (TEE);  Surgeon: Adrian Prows, MD;  Location: [facility] ENDOSCOPY;  Service: Cardiovascular;  Laterality: N/A;    Current Outpatient Prescriptions  Medication Sig Dispense Refill  . ALPRAZolam (XANAX) 0.25 MG tablet Take 0.25 mg by mouth 2 (two) times daily as needed for anxiety.    Erin Yates amLODipine (NORVASC) 10 MG tablet Take 10 mg by mouth daily. PM    . aspirin EC 325 MG tablet Reported on 03/27/2016  0  . atorvastatin (LIPITOR) 80 MG tablet Take 80 mg by mouth daily. PM    . buPROPion (WELLBUTRIN SR) 150 MG 12 hr tablet Take 1 tablet (150  mg total) by mouth 2 (two) times daily. 60 tablet 5  . ergocalciferol (VITAMIN D2) 50000 UNITS capsule Take 50,000 Units by mouth once a week.    . fluticasone (FLONASE) 50 MCG/ACT nasal spray Place into both nostrils daily as needed for allergies or rhinitis.    Erin Yates glipiZIDE (GLUCOTROL XL) 2.5 MG 24 hr tablet TK 1 T  PO QD  11  . levothyroxine (SYNTHROID, LEVOTHROID) 50 MCG tablet Take 50 mcg by mouth daily before breakfast.    . losartan (COZAAR) 100 MG tablet Take 100 mg by mouth daily. AM    . metFORMIN (GLUCOPHAGE) 1000 MG tablet Take 1,000 mg by mouth 2 (two) times daily with a meal.    . metoprolol (TOPROL-XL) 200 MG 24 hr  tablet Take 200 mg by mouth daily. AM    . pantoprazole (PROTONIX) 40 MG tablet Take 40 mg by mouth 2 (two) times daily. Reported on 04/17/2016    . venlafaxine XR (EFFEXOR-XR) 150 MG 24 hr capsule Take 150 mg by mouth daily with breakfast.    . vitamin B-12 (CYANOCOBALAMIN) 500 MCG tablet Take 500 mcg by mouth daily. AM    . zolpidem (AMBIEN) 5 MG tablet Take 5 mg by mouth at bedtime as needed for sleep.    Erin Yates apixaban (ELIQUIS) 5 MG TABS tablet Take 1 tablet (5 mg total) by mouth 2 (two) times daily. 180 tablet 3  . diltiazem (CARDIZEM) 30 MG tablet Cardizem 30mg  -- take 1 tablet every 4 hours AS NEEDED for heart rate >100 as long as blood pressure >100. 45 tablet 1   No current facility-administered medications for this encounter.     Allergies  Allergen Reactions  . Penicillins Rash    Also swelling and itching    Social History   Social History  . Marital status: Married    Spouse name: N/A  . Number of children: N/A  . Years of education: N/A   Occupational History  . Not on file.   Social History Main Topics  . Smoking status: Current Every Day Smoker    Packs/day: 0.50    Years: 25.00  . Smokeless tobacco: Not on file  . Alcohol use No  . Drug use: No  . Sexual activity: Not on file   Other Topics Concern  . Not on file   Social History Narrative  . No narrative on file    Family History  Problem Relation Age of Onset  . Breast cancer Sister 16  . Dementia Father     ROS- All systems are reviewed and negative except as per the HPI above  Physical Exam: Vitals:   06/14/16 0925  BP: 122/64  Weight: 143 lb 3.2 oz (65 kg)  Height: 5\' 5"  (1.651 m)    GEN- The patient is well appearing, alert and oriented x 3 today.   Head- normocephalic, atraumatic Eyes-  Sclera clear, conjunctiva pink Ears- hearing intact Oropharynx- clear Neck- supple, no JVP Lymph- no cervical lymphadenopathy Lungs- Clear to ausculation bilaterally, normal work of  breathing Heart- Regular rate and rhythm, no murmurs, rubs or gallops, PMI not laterally displaced GI- soft, NT, ND, + BS Extremities- no clubbing, cyanosis, or edema MS- no significant deformity or atrophy Skin- no rash or lesion Psych- euthymic mood, full affect Neuro- strength and sensation are intact  EKG-SB at 57 bpm, pr int 224 ms qrs int 84 ms, qtc 438 ms Remote strips reviewed Epic records reviewed  TEE-Left ventricle: Normal cavity size,  wall thickness, left ventricular diastolic function and left atrial pressure.  Plaque present in the ascending aorta and distal to left subclavian artery is mild (Grade 2: Mild-focal or diffuse int. thick. of 2-78mm). No graft present. No significant coarctation present. No aortic dissection present  Normal atrial septum with no residual atrial septal defect and normal septal motion. PFO closure device present. Amplatzer closure device in good position. No thrombus. No residual shunting by color Doppler or agitated saline injjection. No cardiac source of cerebral embolism noted. Minimal plaque in the aorta without complex plaque may also potentially be source. Labs, 4/17  Creatinine 0.79, K+4.0,CBC WNL   Assessment and Plan:  1. PAF Discussion of anticoagulants, warfarin, eliquis, xarelto Pt would like to start eliquis 5 mg bid, 30 days free coupon given and 90 day refill sent in at pt request  Bleeding precautions discussed, per pt no bleeding contraindications, remote GI bleed, steady on his feet Will probably stop asa but will message PCP to see if any reason to continue at 81 mg Continue metoprolol 200 mg a day Will RX cardizem 30 mg to use on as needed if symptomatic afib episodes occur Continue monitoring for afib burden thru LinQ Sleep study ordered Smoking cessation encouraged  2.Recurrent CVA's Per Dr. Leonie Man  F/u in afib clinic in one month  Geroge Baseman. Yogi Arther, [facility] [address] [place], [place] [address] [phone]

## 2016-06-16 ENCOUNTER — Other Ambulatory Visit: Payer: Self-pay

## 2016-06-16 DIAGNOSIS — I4891 Unspecified atrial fibrillation: Secondary | ICD-10-CM

## 2016-06-23 ENCOUNTER — Telehealth: Payer: Self-pay | Admitting: Internal Medicine

## 2016-06-23 ENCOUNTER — Telehealth: Payer: Self-pay | Admitting: *Deleted

## 2016-06-23 ENCOUNTER — Telehealth: Payer: Self-pay | Admitting: Cardiology

## 2016-06-23 NOTE — Telephone Encounter (Signed)
New Message  Pt voiced her lup recorder has not updated in 14 days and she needs assistance.  Please follow up with pt. Thanks!

## 2016-06-23 NOTE — Telephone Encounter (Signed)
Spoke w/ pt. Transmission received.

## 2016-06-23 NOTE — Telephone Encounter (Signed)
Informed pt hat we received her remote transmission.

## 2016-06-23 NOTE — Telephone Encounter (Signed)
Spoke w/ pt and requested that her send a manual transmission b/c she home monitor has not updated in at least 14 days.   

## 2016-06-23 NOTE — Telephone Encounter (Deleted)
error 

## 2016-06-27 ENCOUNTER — Telehealth (HOSPITAL_COMMUNITY): Payer: Self-pay | Admitting: Nurse Practitioner

## 2016-06-27 NOTE — Telephone Encounter (Signed)
Informed pt that I heard back from Dr. Leonie Man and he said it was ok to stop ASA since she started eliquis. She has had multiple strokes and is a smoker so I wanted to be sure.

## 2016-07-03 ENCOUNTER — Ambulatory Visit (INDEPENDENT_AMBULATORY_CARE_PROVIDER_SITE_OTHER): Payer: Commercial Managed Care - HMO | Admitting: *Deleted

## 2016-07-03 DIAGNOSIS — I639 Cerebral infarction, unspecified: Secondary | ICD-10-CM

## 2016-07-04 NOTE — Progress Notes (Signed)
Carelink Summary Report / Loop Recorder 

## 2016-07-13 ENCOUNTER — Ambulatory Visit: Payer: Commercial Managed Care - HMO

## 2016-07-14 ENCOUNTER — Encounter (HOSPITAL_COMMUNITY): Payer: Self-pay | Admitting: Nurse Practitioner

## 2016-07-14 ENCOUNTER — Ambulatory Visit (HOSPITAL_COMMUNITY)
Admission: RE | Admit: 2016-07-14 | Discharge: 2016-07-14 | Disposition: A | Discharge status: Home / Self Care | Payer: Commercial Managed Care - HMO | Source: Ambulatory Visit | Attending: Nurse Practitioner | Admitting: Nurse Practitioner

## 2016-07-14 VITALS — BP 156/70 | HR 53 | Ht 65.0 in | Wt 143.6 lb

## 2016-07-14 DIAGNOSIS — Z7901 Long term (current) use of anticoagulants: Secondary | ICD-10-CM | POA: Insufficient documentation

## 2016-07-14 DIAGNOSIS — I480 Paroxysmal atrial fibrillation: Secondary | ICD-10-CM | POA: Insufficient documentation

## 2016-07-14 DIAGNOSIS — Z880 Allergy status to penicillin: Secondary | ICD-10-CM | POA: Diagnosis not present

## 2016-07-14 DIAGNOSIS — I10 Essential (primary) hypertension: Secondary | ICD-10-CM | POA: Insufficient documentation

## 2016-07-14 DIAGNOSIS — K219 Gastro-esophageal reflux disease without esophagitis: Secondary | ICD-10-CM | POA: Diagnosis not present

## 2016-07-14 DIAGNOSIS — Z8673 Personal history of transient ischemic attack (TIA), and cerebral infarction without residual deficits: Secondary | ICD-10-CM | POA: Diagnosis not present

## 2016-07-14 DIAGNOSIS — F17210 Nicotine dependence, cigarettes, uncomplicated: Secondary | ICD-10-CM | POA: Insufficient documentation

## 2016-07-14 DIAGNOSIS — Z7984 Long term (current) use of oral hypoglycemic drugs: Secondary | ICD-10-CM | POA: Diagnosis not present

## 2016-07-14 DIAGNOSIS — Z79899 Other long term (current) drug therapy: Secondary | ICD-10-CM | POA: Diagnosis not present

## 2016-07-14 NOTE — Progress Notes (Signed)
Patient ID: Erin Yates, female   DOB: Nov 06, 1946, 69 y.o.   MRN: [id number]   Primary Care Physician: Adin Hector, MD Referring Physician:Dr. [name], Device clinic Neurologist: Dr. Christinia Gully Erin Yates is a 70 y.o. female with a h/o tobacco abuse, DM, HTN, hypothyroidism, CVA x 6 in the past as well as a remote PFO closure. Her last cryptogenic stroke was in April of this year and a LinQ monitor was placed 5/30. Afib was identified 8/4 which showed 30 mins of afib, from which pt was aware of the irregular heart beat. She is on 200 mg of metoprolol daily. She admits to terrible snoring to the point that the husband does not sleep with her any longer, daytime somnolence. She states that she had a sleep study years ago, study was cut short for some reason, but she states the test was negative. No alcohol or excessive caffeine. Sleep study pending. She was started on Eliqius 5 mg bid for a chadsvasc score of at least 7.  Erin Yates was kept on board until reviewed with Dr. Leonie Man and he ok'ed to be stopped.  Returns to [facility] 9/8 and is doing well on eliquis. No bleeding issues. No  feelings of fluttering in chest. No further linq reports to be reviewed since 7/29. Plans to go to the beach for around a month.  Today, she denies symptoms of chest pain, shortness of breath, orthopnea, PND, lower extremity edema, dizziness, presyncope, syncope, or neurologic sequela. The patient is tolerating medications without difficulties and is otherwise without complaint today.   Past Medical History:  Diagnosis Date  . Anemia    past  hx  . Anxiety   . Depression   . Diabetes mellitus without complication ([place])   . Dysrhythmia    prior to PFO correction  . GERD (gastroesophageal reflux disease)   . Headache    before PFO closure  . Hypercholesteremia   . Hypertension   . Hypothyroidism   . 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 PFO (patent foramen ovale)    correction device placed 2010    . Right leg numbness    lower leg- residual from CVA  . Seasonal allergies   . Stroke [facility])    prior to PFO correction   Past Surgical History:  Procedure Laterality Date  . ABDOMINAL HYSTERECTOMY    . CARDIAC CATHETERIZATION     [facility]  . CATARACT EXTRACTION W/PHACO Right 09/01/2015   Procedure: CATARACT EXTRACTION PHACO AND INTRAOCULAR LENS PLACEMENT (IOC);  Surgeon: Leandrew Koyanagi, MD;  Location: [place];  Service: Ophthalmology;  Laterality: Right;  DIABETIC - oral meds  . CATARACT EXTRACTION W/PHACO Left 10/20/2015   Procedure: CATARACT EXTRACTION PHACO AND INTRAOCULAR LENS PLACEMENT (IOC);  Surgeon: Leandrew Koyanagi, MD;  Location: [place];  Service: Ophthalmology;  Laterality: Left;  DIABETIC - oral meds  . EP IMPLANTABLE DEVICE N/A 04/04/2016   Procedure: Loop Recorder Insertion;  Surgeon: Deboraha Sprang, MD;  Location: [place] CV LAB;  Service: Cardiovascular;  Laterality: N/A;  . FOOT NEUROMA SURGERY Left   . NASAL SINUS SURGERY    . PATENT FORAMEN OVALE CLOSURE    . PATENT FORAMEN OVALE CLOSURE    . TEE WITHOUT CARDIOVERSION N/A 02/09/2016   Procedure: TRANSESOPHAGEAL ECHOCARDIOGRAM (TEE);  Surgeon: Adrian Prows, MD;  Location: [place];  Service: Cardiovascular;  Laterality: N/A;    Current Outpatient Prescriptions  Medication Sig Dispense  Refill  . ALPRAZolam (XANAX) 0.25 MG tablet Take 0.25 mg by mouth 2 (two) times daily as needed for anxiety.    Marland Kitchen amLODipine (NORVASC) 10 MG tablet Take 10 mg by mouth daily. PM    . apixaban (ELIQUIS) 5 MG TABS tablet Take 1 tablet (5 mg total) by mouth 2 (two) times daily. 180 tablet 3  . atorvastatin (LIPITOR) 80 MG tablet Take 80 mg by mouth daily. PM    . buPROPion (WELLBUTRIN SR) 150 MG 12 hr tablet Take 1 tablet (150 mg total) by mouth 2 (two) times daily. 60 tablet 5  . diltiazem (CARDIZEM) 30 MG tablet Cardizem 30mg  -- take 1 tablet every 4 hours AS NEEDED for heart rate >100 as long as blood  pressure >100. 45 tablet 1  . ergocalciferol (VITAMIN D2) 50000 UNITS capsule Take 50,000 Units by mouth once a week.    . fluticasone (FLONASE) 50 MCG/ACT nasal spray Place into both nostrils daily as needed for allergies or rhinitis.    Marland Kitchen glipiZIDE (GLUCOTROL XL) 2.5 MG 24 hr tablet TK 1 T  PO QD  11  . levothyroxine (SYNTHROID, LEVOTHROID) 50 MCG tablet Take 50 mcg by mouth daily before breakfast.    . losartan (COZAAR) 100 MG tablet Take 100 mg by mouth daily. AM    . metFORMIN (GLUCOPHAGE) 1000 MG tablet Take 1,000 mg by mouth 2 (two) times daily with a meal.    . metoprolol (TOPROL-XL) 200 MG 24 hr tablet Take 200 mg by mouth daily. AM    . pantoprazole (PROTONIX) 40 MG tablet Take 40 mg by mouth 2 (two) times daily. Reported on 04/17/2016    . venlafaxine XR (EFFEXOR-XR) 150 MG 24 hr capsule Take 150 mg by mouth daily with breakfast.    . vitamin B-12 (CYANOCOBALAMIN) 500 MCG tablet Take 500 mcg by mouth daily. AM    . zolpidem (AMBIEN) 5 MG tablet Take 5 mg by mouth at bedtime as needed for sleep.     No current facility-administered medications for this encounter.     Allergies  Allergen Reactions  . Penicillins Rash    Also swelling and itching    Social History   Social History  . Marital status: Married    Spouse name: N/A  . Number of children: N/A  . Years of education: N/A   Occupational History  . Not on file.   Social History Main Topics  . Smoking status: Current Every Day Smoker    Packs/day: 0.50    Years: 25.00  . Smokeless tobacco: Not on file  . Alcohol use No  . Drug use: No  . Sexual activity: Not on file   Other Topics Concern  . Not on file   Social History Narrative  . No narrative on file    Family History  Problem Relation Age of Onset  . Breast cancer Sister 90  . Dementia Father     ROS- All systems are reviewed and negative except as per the HPI above  Physical Exam: Vitals:   07/14/16 0940  BP: (!) 156/70  Pulse: (!) 53    Weight: 143 lb 9.6 oz (65.1 kg)  Height: 5\' 5"  (1.651 m)    GEN- The patient is well appearing, alert and oriented x 3 today.   Head- normocephalic, atraumatic Eyes-  Sclera clear, conjunctiva pink Ears- hearing intact Oropharynx- clear Neck- supple, no JVP Lymph- no cervical lymphadenopathy Lungs- Clear to ausculation bilaterally, normal work of breathing Heart-  Regular rate and rhythm, no murmurs, rubs or gallops, PMI not laterally displaced GI- soft, NT, ND, + BS Extremities- no clubbing, cyanosis, or edema MS- no significant deformity or atrophy Skin- no rash or lesion Psych- euthymic mood, full affect Neuro- strength and sensation are intact  EKG-SB at 53 bpm, pr int 224 ms qrs int 86 ms, qtc 429 ms  Epic records reviewed  02-09-16 TEE-Left ventricle: Normal cavity size, wall thickness, left ventricular diastolic function and left atrial pressure.  Plaque present in the ascending aorta and distal to left subclavian artery is mild (Grade 2: Mild-focal or diffuse int. thick. of 2-16mm). No graft present. No significant coarctation present. No aortic dissection present  Normal atrial septum with no residual atrial septal defect and normal septal motion. PFO closure device present. Amplatzer closure device in good position. No thrombus. No residual shunting by color Doppler or agitated saline injjection. No cardiac source of cerebral embolism noted. Minimal plaque in the aorta without complex plaque may also potentially be source. Labs, 4/17  Creatinine 0.79, K+4.0,CBC WNL   Assessment and Plan:  1. PAF New onset on non sustained afib with chadsvasc score of at least 7, in setting of multiple strokes Continue eliquis 5 mg bid, tolerating well  Off asa Bleeding precautions discussed, per pt no bleeding contraindications, remote GI bleed, steady on  feet Continue metoprolol 200 mg a day Will RX cardizem 30 mg to use on as needed if symptomatic afib episodes occur Continue  monitoring for afib burden thru LinQ Sleep study ordered Smoking cessation encouraged  2.Recurrent CVA's Per Dr. Leonie Man  F/uDr. [name] in 3 months  Geroge Baseman. [place] Hechav[place], [facility] [address] [place], [place] [address] [phone]

## 2016-07-29 LAB — CUP PACEART REMOTE DEVICE CHECK: Date Time Interrogation Session: 20170828133540

## 2016-07-29 NOTE — Progress Notes (Signed)
Carelink summary report received. Battery status OK. Normal device function. No new symptom episodes, brady, or pause episodes. 0.1% AF, tachy episodes are AF with RVR.  +Eliquis.  V rates elevated. Monthly summary reports and ROV/PRN

## 2016-07-30 DIAGNOSIS — S93401A Sprain of unspecified ligament of right ankle, initial encounter: Secondary | ICD-10-CM | POA: Diagnosis not present

## 2016-07-30 DIAGNOSIS — S82409A Unspecified fracture of shaft of unspecified fibula, initial encounter for closed fracture: Secondary | ICD-10-CM | POA: Diagnosis not present

## 2016-08-01 ENCOUNTER — Encounter (HOSPITAL_BASED_OUTPATIENT_CLINIC_OR_DEPARTMENT_OTHER): Payer: Self-pay

## 2016-08-02 ENCOUNTER — Ambulatory Visit (INDEPENDENT_AMBULATORY_CARE_PROVIDER_SITE_OTHER): Payer: Commercial Managed Care - HMO | Admitting: *Deleted

## 2016-08-02 DIAGNOSIS — I639 Cerebral infarction, unspecified: Secondary | ICD-10-CM

## 2016-08-02 NOTE — Progress Notes (Signed)
Carelink Summary Report / Loop Recorder 

## 2016-08-03 DIAGNOSIS — M25571 Pain in right ankle and joints of right foot: Secondary | ICD-10-CM | POA: Diagnosis not present

## 2016-08-03 DIAGNOSIS — S8264XA Nondisplaced fracture of lateral malleolus of right fibula, initial encounter for closed fracture: Secondary | ICD-10-CM | POA: Diagnosis not present

## 2016-08-18 DIAGNOSIS — S82831D Other fracture of upper and lower end of right fibula, subsequent encounter for closed fracture with routine healing: Secondary | ICD-10-CM | POA: Diagnosis not present

## 2016-08-18 DIAGNOSIS — M25571 Pain in right ankle and joints of right foot: Secondary | ICD-10-CM | POA: Diagnosis not present

## 2016-08-22 ENCOUNTER — Other Ambulatory Visit: Payer: Self-pay | Admitting: Internal Medicine

## 2016-08-22 ENCOUNTER — Ambulatory Visit
Admission: RE | Admit: 2016-08-22 | Discharge: 2016-08-22 | Disposition: A | Discharge status: Home / Self Care | Payer: Commercial Managed Care - HMO | Source: Ambulatory Visit | Attending: Internal Medicine | Admitting: Internal Medicine

## 2016-08-22 DIAGNOSIS — Z1231 Encounter for screening mammogram for malignant neoplasm of breast: Secondary | ICD-10-CM

## 2016-08-23 DIAGNOSIS — H53462 Homonymous bilateral field defects, left side: Secondary | ICD-10-CM | POA: Diagnosis not present

## 2016-08-24 DIAGNOSIS — D519 Vitamin B12 deficiency anemia, unspecified: Secondary | ICD-10-CM | POA: Diagnosis not present

## 2016-08-24 DIAGNOSIS — R809 Proteinuria, unspecified: Secondary | ICD-10-CM | POA: Diagnosis not present

## 2016-08-24 DIAGNOSIS — E1129 Type 2 diabetes mellitus with other diabetic kidney complication: Secondary | ICD-10-CM | POA: Diagnosis not present

## 2016-08-24 DIAGNOSIS — E1165 Type 2 diabetes mellitus with hyperglycemia: Secondary | ICD-10-CM | POA: Diagnosis not present

## 2016-08-24 DIAGNOSIS — I10 Essential (primary) hypertension: Secondary | ICD-10-CM | POA: Diagnosis not present

## 2016-08-29 DIAGNOSIS — H53462 Homonymous bilateral field defects, left side: Secondary | ICD-10-CM | POA: Diagnosis not present

## 2016-08-31 DIAGNOSIS — Z23 Encounter for immunization: Secondary | ICD-10-CM | POA: Diagnosis not present

## 2016-08-31 DIAGNOSIS — E1129 Type 2 diabetes mellitus with other diabetic kidney complication: Secondary | ICD-10-CM | POA: Diagnosis not present

## 2016-08-31 DIAGNOSIS — K219 Gastro-esophageal reflux disease without esophagitis: Secondary | ICD-10-CM | POA: Diagnosis not present

## 2016-08-31 DIAGNOSIS — E034 Atrophy of thyroid (acquired): Secondary | ICD-10-CM | POA: Diagnosis not present

## 2016-08-31 DIAGNOSIS — I739 Peripheral vascular disease, unspecified: Secondary | ICD-10-CM | POA: Diagnosis not present

## 2016-08-31 DIAGNOSIS — F3342 Major depressive disorder, recurrent, in full remission: Secondary | ICD-10-CM | POA: Diagnosis not present

## 2016-08-31 DIAGNOSIS — E784 Other hyperlipidemia: Secondary | ICD-10-CM | POA: Diagnosis not present

## 2016-08-31 DIAGNOSIS — I10 Essential (primary) hypertension: Secondary | ICD-10-CM | POA: Diagnosis not present

## 2016-08-31 DIAGNOSIS — Z9889 Other specified postprocedural states: Secondary | ICD-10-CM | POA: Diagnosis not present

## 2016-09-01 ENCOUNTER — Ambulatory Visit (INDEPENDENT_AMBULATORY_CARE_PROVIDER_SITE_OTHER): Payer: Commercial Managed Care - HMO | Admitting: *Deleted

## 2016-09-01 DIAGNOSIS — I639 Cerebral infarction, unspecified: Secondary | ICD-10-CM

## 2016-09-04 NOTE — Progress Notes (Signed)
Carelink Summary Report / Loop Recorder 

## 2016-09-07 DIAGNOSIS — M25371 Other instability, right ankle: Secondary | ICD-10-CM | POA: Diagnosis not present

## 2016-09-07 DIAGNOSIS — S82831D Other fracture of upper and lower end of right fibula, subsequent encounter for closed fracture with routine healing: Secondary | ICD-10-CM | POA: Diagnosis not present

## 2016-09-14 DIAGNOSIS — Z780 Asymptomatic menopausal state: Secondary | ICD-10-CM | POA: Diagnosis not present

## 2016-10-02 ENCOUNTER — Ambulatory Visit (INDEPENDENT_AMBULATORY_CARE_PROVIDER_SITE_OTHER): Payer: Commercial Managed Care - HMO | Admitting: *Deleted

## 2016-10-02 DIAGNOSIS — I639 Cerebral infarction, unspecified: Secondary | ICD-10-CM

## 2016-10-03 NOTE — Progress Notes (Signed)
Carelink Summary Report / Loop Recorder 

## 2016-10-09 ENCOUNTER — Ambulatory Visit: Payer: Self-pay | Admitting: Neurology

## 2016-10-09 LAB — CUP PACEART REMOTE DEVICE CHECK
Date Time Interrogation Session: 20171027150934
MDC IDC PG IMPLANT DT: 20170530

## 2016-10-09 NOTE — Progress Notes (Signed)
Carelink summary report received. Battery status OK. Normal device function. No new symptom episodes, tachy episodes, brady, or pause episodes. No new AF episodes. Monthly summary reports and ROV/PRN 

## 2016-10-27 IMAGING — MR MR HEAD W/O CM
2 series · 48 of 48 positions shown · non-contrast
Comparison: CT head 02/07/2016

CLINICAL DATA: Stroke.  Vision loss

EXAM:
MRI HEAD WITHOUT CONTRAST
TECHNIQUE: Multiplanar, multiecho pulse sequences of the brain and surrounding
structures were obtained without intravenous contrast.

[Series 3: DWI · axial · 3.0mm · 1.80mm/px · z∈[-81,+81]mm · 23 of 52 slices shown (1 of 2)]
[im 1/52]
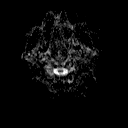
[im 3/52]
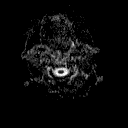
[im 5/52]
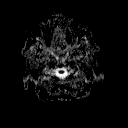
[im 7/52]
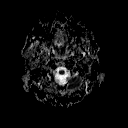
[im 10/52]
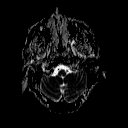
[im 12/52]
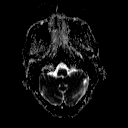
[im 14/52]
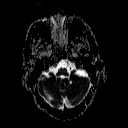
[im 17/52]
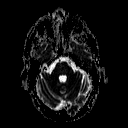
[im 19/52]
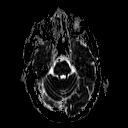
[im 21/52]
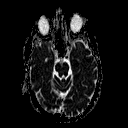
[im 24/52]
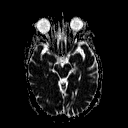
[im 26/52]
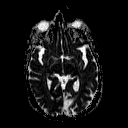
[im 28/52]
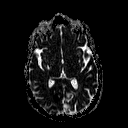
[im 31/52]
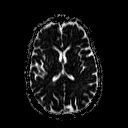
[im 33/52]
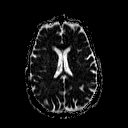
[im 35/52]
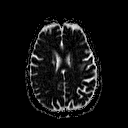
[im 38/52]
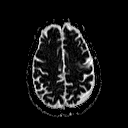
[im 40/52]
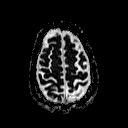
[im 42/52]
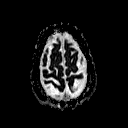
[im 45/52]
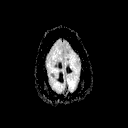
[im 47/52]
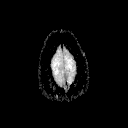
[im 49/52]
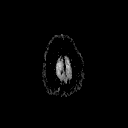
[im 52/52]
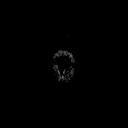

[Series 100: DWI · axial · 3.0mm · 1.80mm/px · z∈[-81,+81]mm · 25 of 55 slices shown (2 of 2)]
[im 1/55]
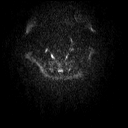
[im 3/55]
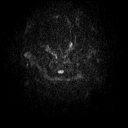
[im 5/55]
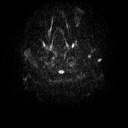
[im 7/55]
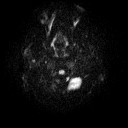
[im 10/55]
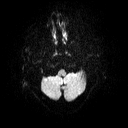
[im 12/55]
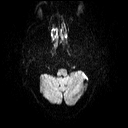
[im 14/55]
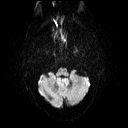
[im 16/55]
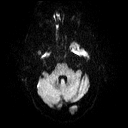
[im 19/55]
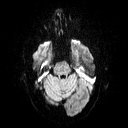
[im 21/55]
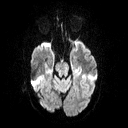
[im 23/55]
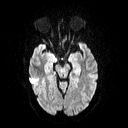
[im 25/55]
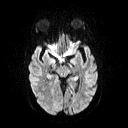
[im 28/55]
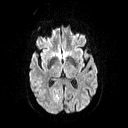
[im 30/55]
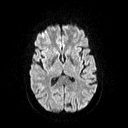
[im 32/55]
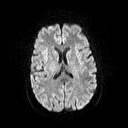
[im 34/55]
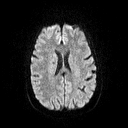
[im 37/55]
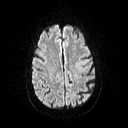
[im 39/55]
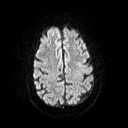
[im 41/55]
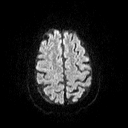
[im 43/55]
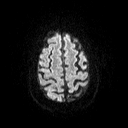
[im 46/55]
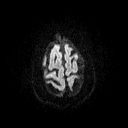
[im 48/55]
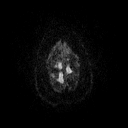
[im 50/55]
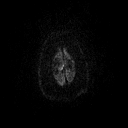
[im 52/55]
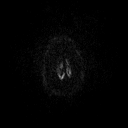
[im 55/55]
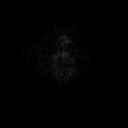

[48 of 48 positions shown; findings below may reference images not displayed]

FINDINGS: The exam was supervised by Dr. Nomasibulele, neurohospitalist. Single
sequence performed. Axial diffusion imaging only was performed.

Acute infarct in the right occipital parietal white matter. Acute
infarct in the right posterior cingulate gyrus on the right. Small
area of acute infarct in the right parietal convexity.
IMPRESSION: Acute infarcts in the right parietal lobe and right posterior
temporal parietal lobe in the cingulate gyrus.

Examination limited to diffusion imaging only.

## 2016-10-27 IMAGING — CT CT HEAD W/O CM
1 series · 15 of 29 positions shown, 19 images · non-contrast
Comparison: 11/24/2010.

ADDENDUM:
These results were called by telephone at the time of interpretation
on 02/07/2016 at [DATE] to Dr. KENNY BRANCHET MADORI , who verbally
acknowledged these results.
CLINICAL DATA: 69-year-old hypertensive female with visual loss
greater on the left since this morning. Initial encounter.

EXAM:
CT HEAD WITHOUT CONTRAST
TECHNIQUE: Contiguous axial images were obtained from the base of the skull
through the vertex without intravenous contrast.

[Series 2: soft tissue · axial · 0.41mm/px · z∈[+440,+570]mm · 15 of 29 slices shown, 19 images]
[im 2/29  brain]
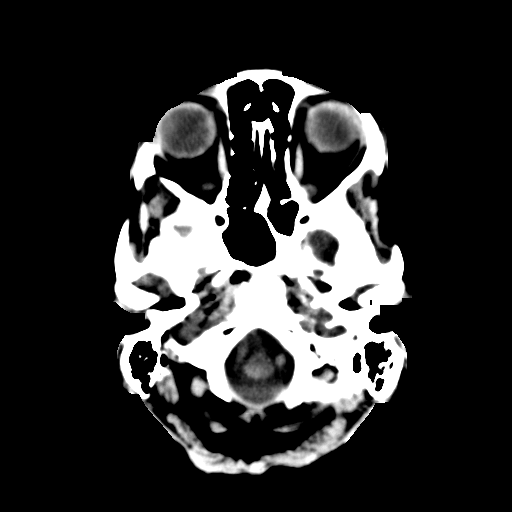
[im 2/29  bone]
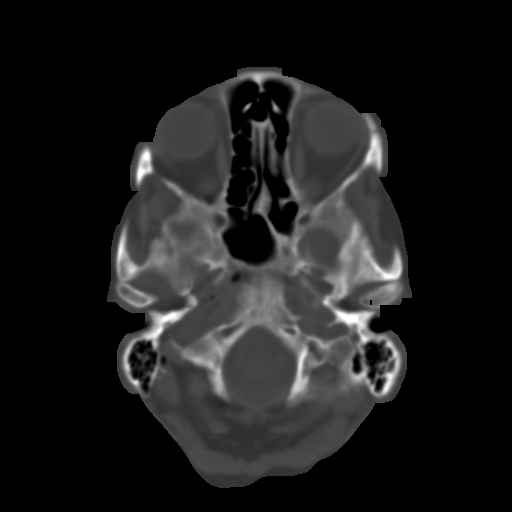
[im 4/29  brain]
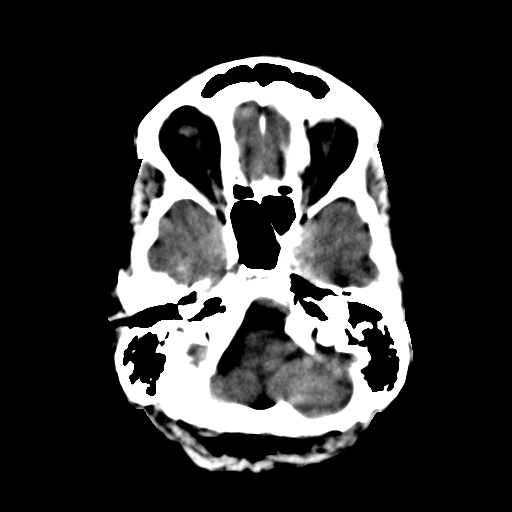
[im 6/29  brain]
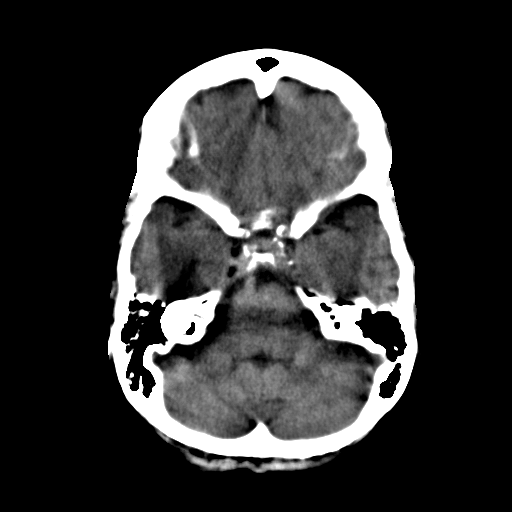
[im 8/29  brain]
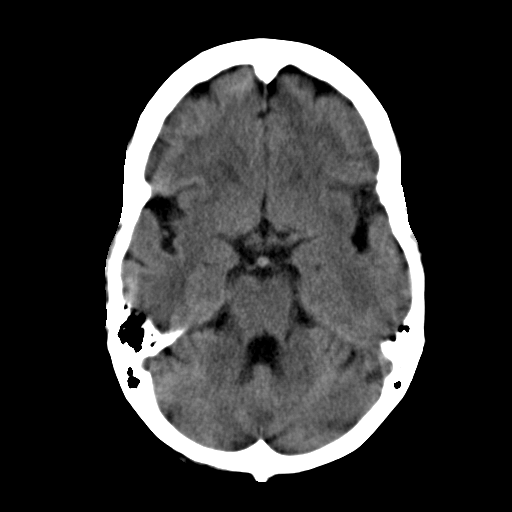
[im 10/29  brain]
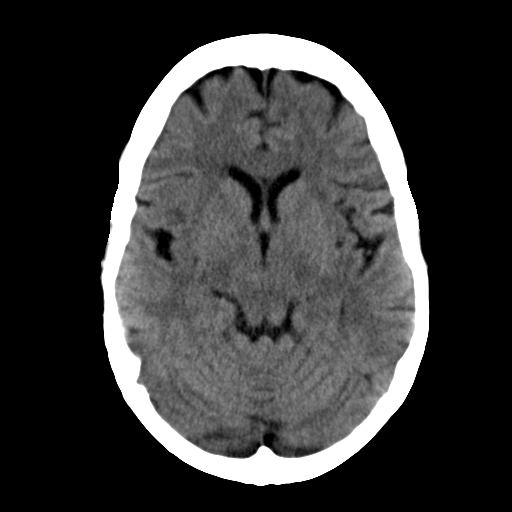
[im 10/29  bone]
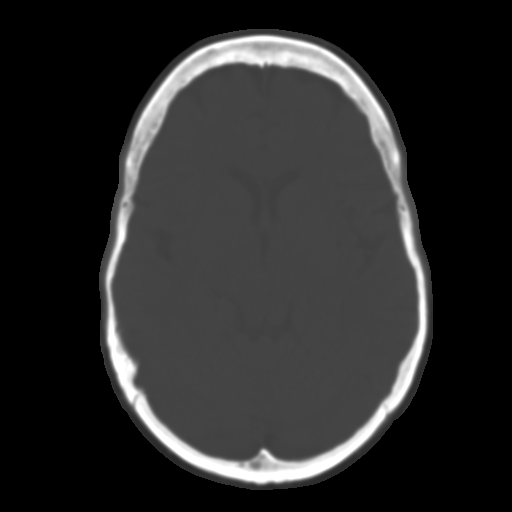
[im 11/29  brain]
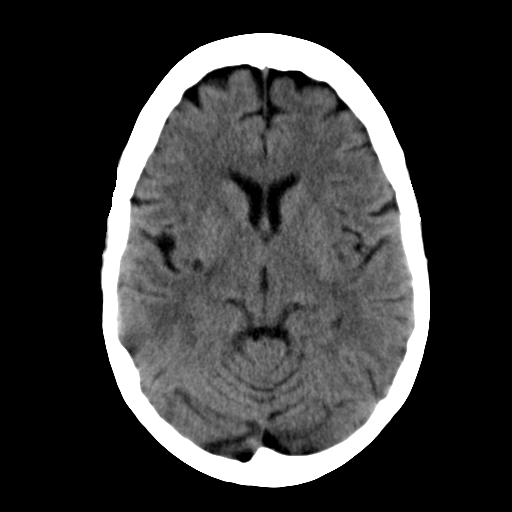
[im 13/29  brain]
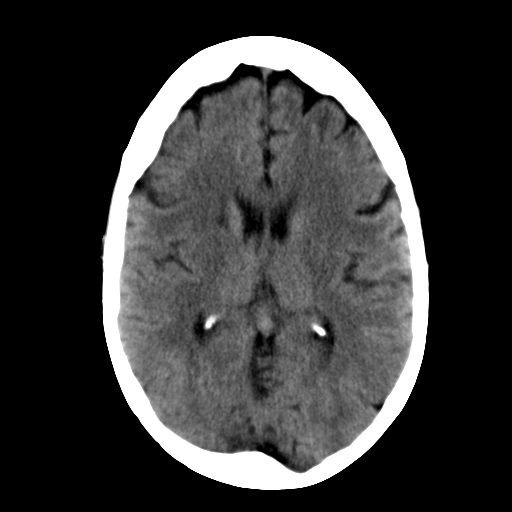
[im 15/29  brain]
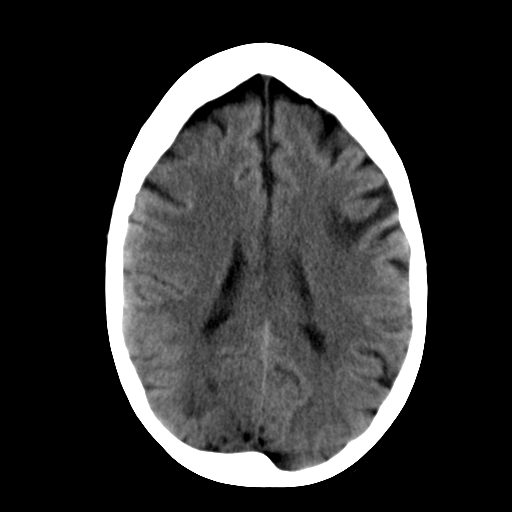
[im 17/29  brain]
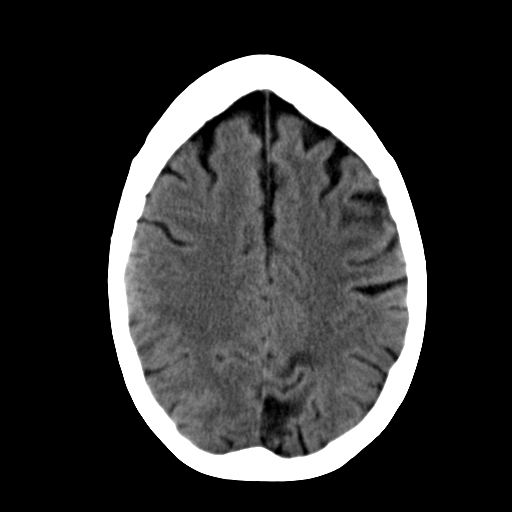
[im 17/29  bone]
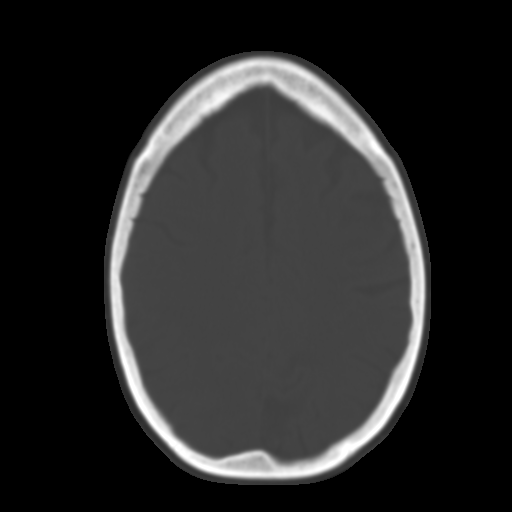
[im 19/29  brain]
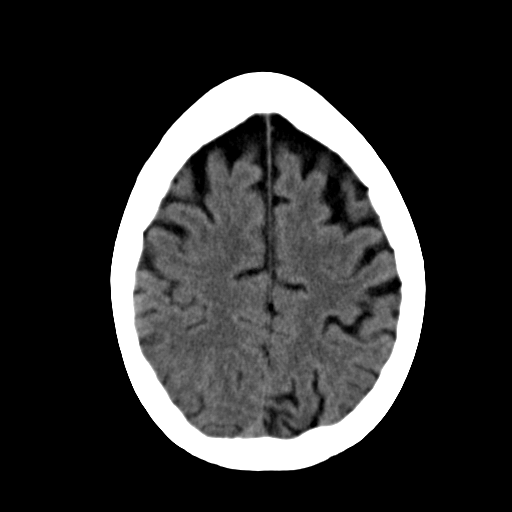
[im 20/29  brain]
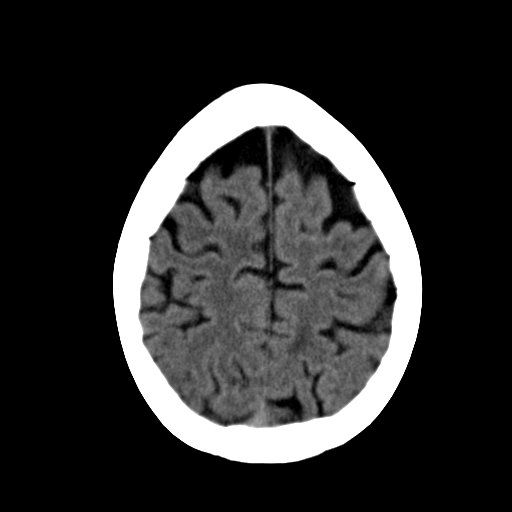
[im 22/29  brain]
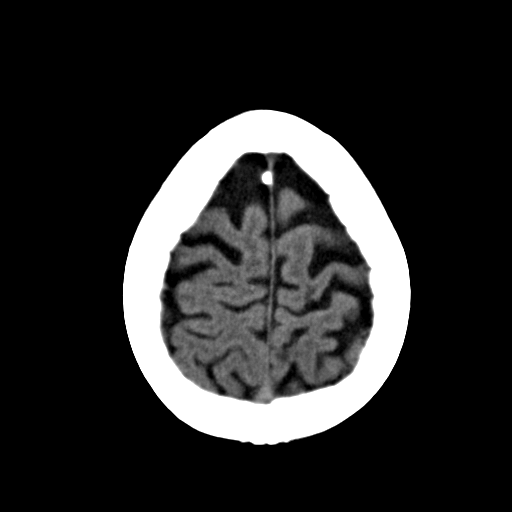
[im 24/29  brain]
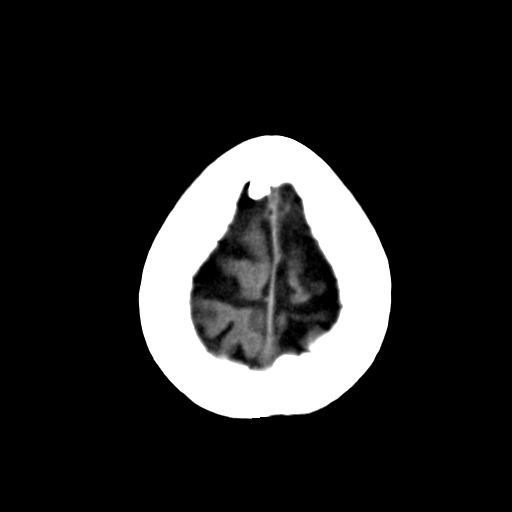
[im 24/29  bone]
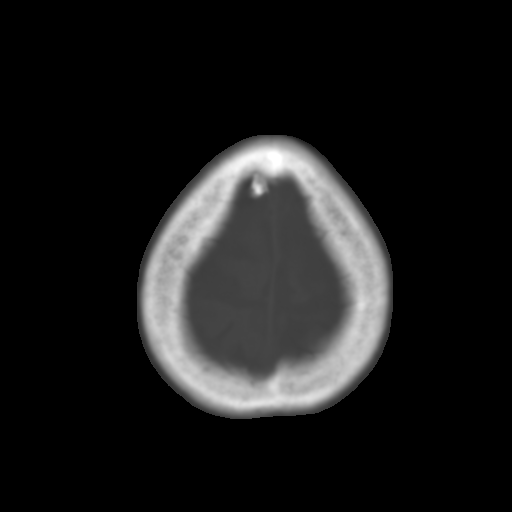
[im 26/29  brain]
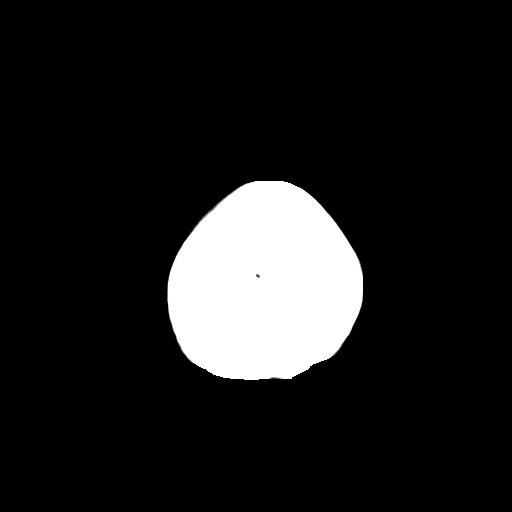
[im 28/29  brain]
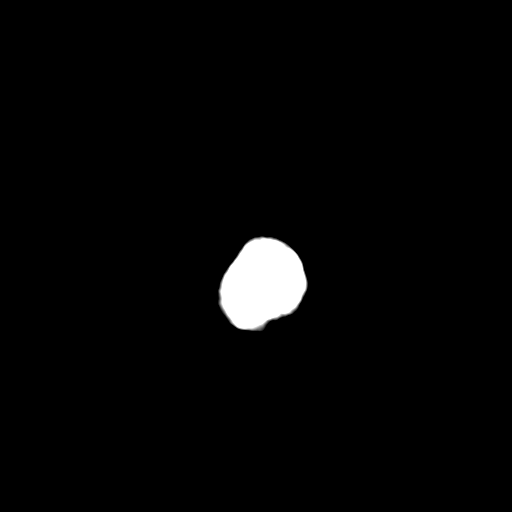

[15 of 29 positions shown; findings below may reference images not displayed]

FINDINGS: No intracranial hemorrhage.

Remote left frontal lobe, left parietal-occipital lobe, right
caudate head region/ anterior limb right internal capsule and
posterior right lenticular nucleus infarct.

Right parietal -occipital lobe infarct of indeterminate age.

1 cm calcification anterior right parafalcine region unchanged
possibly a small calcified meningioma.

Only portion of the orbits are visualized and appear unremarkable.

Vascular calcifications.
IMPRESSION: No intracranial hemorrhage.

Remote left frontal lobe, left parietal-occipital lobe, right
caudate head region/ anterior limb right internal capsule and
posterior right lenticular nucleus infarcts.

Right parietal -occipital lobe infarct of indeterminate age.

1 cm calcification anterior right parafalcine region unchanged
possibly a small calcified meningioma.

## 2016-10-27 IMAGING — CT CT ANGIO HEAD
1 of 9 series · 5 of 33 positions shown · IV contrast (OMNI 350)
Comparison: Noncontrast head CT 3511 hours today.

CLINICAL DATA: 69-year-old female status post IV tPA at noon today
following presentation of severe headache, loss of speech,
confusion. Acute posterior right hemisphere infarcts on diffusion
MRI at that time. Initial encounter.

EXAM:
CT ANGIOGRAPHY HEAD AND NECK
TECHNIQUE: Multidetector CT imaging of the head and neck was performed using
the standard protocol during bolus administration of intravenous
contrast. Multiplanar CT image reconstructions and MIPs were
obtained to evaluate the vascular anatomy. Carotid stenosis
measurements (when applicable) are obtained utilizing NASCET
criteria, using the distal internal carotid diameter as the
denominator.
CONTRAST:  50 mL Isovue 370

[Series 7: cta neck axial · axial · 0.39mm/px · z∈[+1328,+1542]mm · 5 of 322 slices shown]
[im 54/322  soft-tissue]
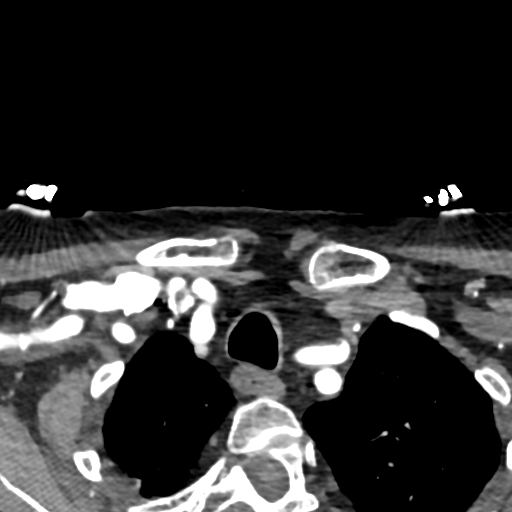
[im 108/322  bone]
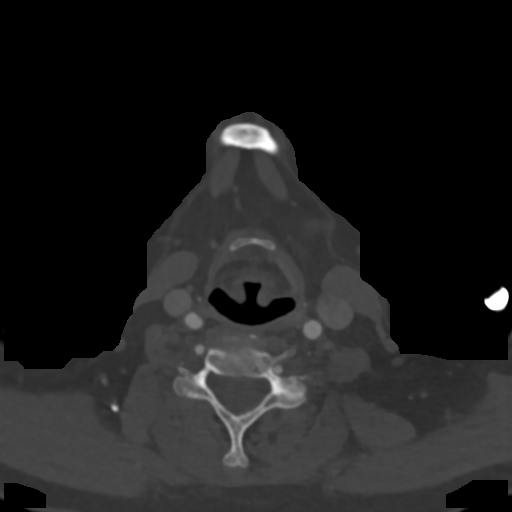
[im 161/322  soft-tissue]
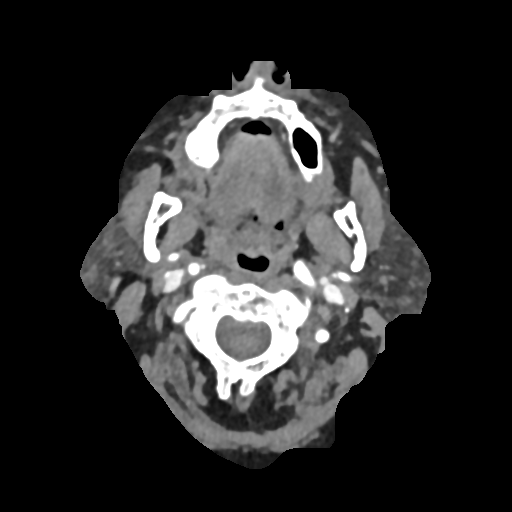
[im 215/322  bone]
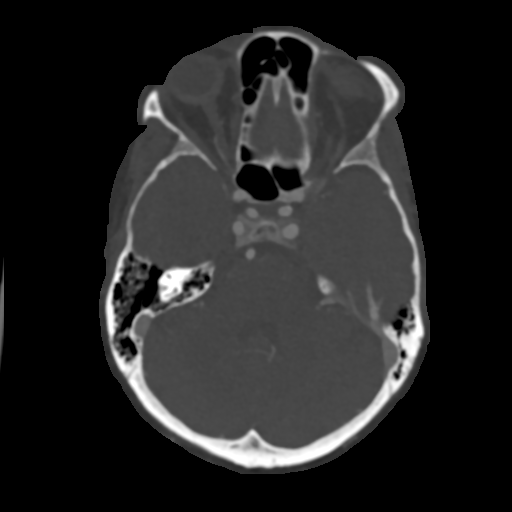
[im 268/322  soft-tissue]
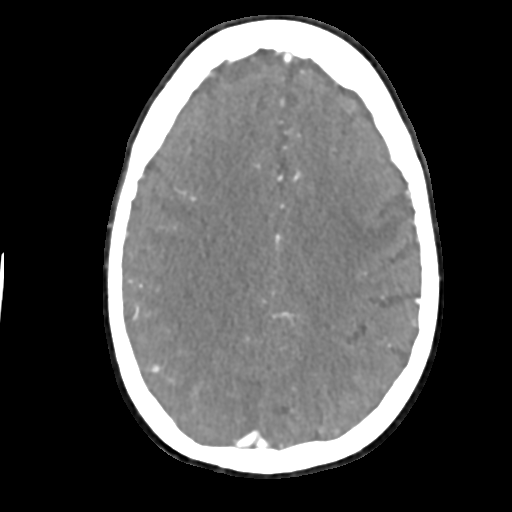

[5 of 33 positions shown; findings below may reference images not displayed]

FINDINGS: CTA NECK

Skeleton: Partially visible thoracic scoliosis. No acute osseous
abnormality identified.

Previous paranasal sinus surgery on the left.  Sinuses are clear.

Other neck: Emphysema with nonspecific dependent opacity in both
upper lobes. No superior mediastinal lymphadenopathy.

Thyroid, larynx (glottis closed), pharynx, parapharyngeal spaces,
retropharyngeal space, sublingual space, submandibular glands, and
parotid glands are within normal limits. No cervical
lymphadenopathy.

Aortic arch: Bovine arch configuration. Minimal calcified arch
atherosclerosis.

Right carotid system: Tortuous proximal right CCA. Negative right
carotid bifurcation. Minimal cervical right ICA calcified plaque.

Left carotid system: Bovine arch configuration. Tortuous proximal
left CCA with a kinked appearance (series 8, image 123). Minimal
left CCA and left carotid bifurcation atherosclerosis with no
stenosis. Mildly tortuous cervical left ICA.

Vertebral arteries:

No proximal right subclavian artery stenosis. Normal right vertebral
artery origin. Negative cervical right vertebral artery.

No proximal left subclavian artery stenosis. Normal left vertebral
artery origin despite nearby calcified plaque (series 8, image 132).
Tortuous left V1 segment. Calcified plaque in the proximal left V2
segment without significant stenosis (series 8, image 129).
Otherwise negative left vertebral artery to the skullbase.

CTA HEAD

Posterior circulation: No distal vertebral artery stenosis. Mild
generalized distal vertebral and vertebrobasilar dolichoectasia.
Normal right PICA origin. Dominant appearing left AICA origin.
Tortuous basilar artery without stenosis. Normal SCA and PCA
origins. Posterior communicating arteries are diminutive or absent.
The left PCA branches are within normal limits.

There does appear to be occlusion of a right lateral PCA P3 branch
best seen on series 10, image 19, also series 8, image 97.

Anterior circulation: Tortuous ICA siphons with mild calcified
plaque an no siphon stenosis. The left siphon appears mildly
dominant owing to dominant left ACA A1 segment, right A1 is
diminutive. Patent carotid termini. Normal MCA and left ACA origins.
Anterior communicating artery and bilateral ACA branches are within
normal limits. Left MCA M1 segment, bifurcation, and left MCA
branches are within normal limits. Right MCA M1 segment,
bifurcation, and right MCA branches are within normal limits.

Venous sinuses: Patent.

Anatomic variants: Dominant left ACA A1 segment. Bovine type arch
configuration.

Delayed phase: No abnormal enhancement identified. Chronic
encephalomalacia in the left MCA and PCA territories. Patchy right
PCA territory hypodensity corresponding to acute ischemia re-
demonstrated and appears stable by CT. Chronic appearing lacunar
infarct right caudate. No acute intracranial hemorrhage identified.
No midline shift, mass effect, or evidence of intracranial mass
lesion.
IMPRESSION: 1. Negative for emergent large vessel occlusion. Positive for right
PCA P3 branch occlusion.
2. Stable CT appearance of patchy right PCA ischemia from earlier
today. No intracranial hemorrhage or mass effect.
3. Generalized arterial tortuosity. No significant arterial
stenosis. Minimal for age atherosclerosis at the arch and in the
neck. Mild for age calcified ICA siphon plaque.
4. Emphysema with dependent pulmonary opacity, favor atelectasis.

## 2016-10-31 ENCOUNTER — Ambulatory Visit (INDEPENDENT_AMBULATORY_CARE_PROVIDER_SITE_OTHER): Payer: Commercial Managed Care - HMO | Admitting: *Deleted

## 2016-10-31 DIAGNOSIS — I639 Cerebral infarction, unspecified: Secondary | ICD-10-CM | POA: Diagnosis not present

## 2016-11-01 NOTE — Progress Notes (Signed)
Carelink Summary Report / Loop Recorder 

## 2016-11-03 ENCOUNTER — Encounter: Payer: Self-pay | Admitting: Cardiology

## 2016-11-03 DIAGNOSIS — R809 Proteinuria, unspecified: Secondary | ICD-10-CM | POA: Diagnosis not present

## 2016-11-03 DIAGNOSIS — I480 Paroxysmal atrial fibrillation: Secondary | ICD-10-CM | POA: Diagnosis not present

## 2016-11-03 DIAGNOSIS — J069 Acute upper respiratory infection, unspecified: Secondary | ICD-10-CM | POA: Diagnosis not present

## 2016-11-03 DIAGNOSIS — I10 Essential (primary) hypertension: Secondary | ICD-10-CM | POA: Diagnosis not present

## 2016-11-03 DIAGNOSIS — E1165 Type 2 diabetes mellitus with hyperglycemia: Secondary | ICD-10-CM | POA: Diagnosis not present

## 2016-11-03 DIAGNOSIS — E1129 Type 2 diabetes mellitus with other diabetic kidney complication: Secondary | ICD-10-CM | POA: Diagnosis not present

## 2016-11-03 DIAGNOSIS — I739 Peripheral vascular disease, unspecified: Secondary | ICD-10-CM | POA: Diagnosis not present

## 2016-11-03 DIAGNOSIS — R0600 Dyspnea, unspecified: Secondary | ICD-10-CM | POA: Diagnosis not present

## 2016-11-10 LAB — CUP PACEART REMOTE DEVICE CHECK
MDC IDC PG IMPLANT DT: 20170530
MDC IDC SESS DTM: 20171126154154

## 2016-11-15 DIAGNOSIS — R938 Abnormal findings on diagnostic imaging of other specified body structures: Secondary | ICD-10-CM | POA: Diagnosis not present

## 2016-11-15 DIAGNOSIS — R05 Cough: Secondary | ICD-10-CM | POA: Diagnosis not present

## 2016-11-29 DIAGNOSIS — D519 Vitamin B12 deficiency anemia, unspecified: Secondary | ICD-10-CM | POA: Diagnosis not present

## 2016-11-29 DIAGNOSIS — I10 Essential (primary) hypertension: Secondary | ICD-10-CM | POA: Diagnosis not present

## 2016-11-29 DIAGNOSIS — I739 Peripheral vascular disease, unspecified: Secondary | ICD-10-CM | POA: Diagnosis not present

## 2016-11-29 DIAGNOSIS — R809 Proteinuria, unspecified: Secondary | ICD-10-CM | POA: Diagnosis not present

## 2016-11-29 DIAGNOSIS — E1165 Type 2 diabetes mellitus with hyperglycemia: Secondary | ICD-10-CM | POA: Diagnosis not present

## 2016-11-29 DIAGNOSIS — E1129 Type 2 diabetes mellitus with other diabetic kidney complication: Secondary | ICD-10-CM | POA: Diagnosis not present

## 2016-11-30 ENCOUNTER — Ambulatory Visit (INDEPENDENT_AMBULATORY_CARE_PROVIDER_SITE_OTHER): Payer: Medicare HMO | Admitting: *Deleted

## 2016-11-30 DIAGNOSIS — I639 Cerebral infarction, unspecified: Secondary | ICD-10-CM | POA: Diagnosis not present

## 2016-12-01 NOTE — Progress Notes (Signed)
Carelink Summary Report / Loop Recorder 

## 2016-12-07 DIAGNOSIS — E784 Other hyperlipidemia: Secondary | ICD-10-CM | POA: Diagnosis not present

## 2016-12-07 DIAGNOSIS — K219 Gastro-esophageal reflux disease without esophagitis: Secondary | ICD-10-CM | POA: Diagnosis not present

## 2016-12-07 DIAGNOSIS — I480 Paroxysmal atrial fibrillation: Secondary | ICD-10-CM | POA: Diagnosis not present

## 2016-12-07 DIAGNOSIS — Z9889 Other specified postprocedural states: Secondary | ICD-10-CM | POA: Diagnosis not present

## 2016-12-07 DIAGNOSIS — I38 Endocarditis, valve unspecified: Secondary | ICD-10-CM | POA: Diagnosis not present

## 2016-12-07 DIAGNOSIS — D519 Vitamin B12 deficiency anemia, unspecified: Secondary | ICD-10-CM | POA: Diagnosis not present

## 2016-12-07 DIAGNOSIS — E034 Atrophy of thyroid (acquired): Secondary | ICD-10-CM | POA: Diagnosis not present

## 2016-12-07 DIAGNOSIS — I739 Peripheral vascular disease, unspecified: Secondary | ICD-10-CM | POA: Diagnosis not present

## 2016-12-07 DIAGNOSIS — Z23 Encounter for immunization: Secondary | ICD-10-CM | POA: Diagnosis not present

## 2016-12-07 DIAGNOSIS — I10 Essential (primary) hypertension: Secondary | ICD-10-CM | POA: Diagnosis not present

## 2016-12-07 DIAGNOSIS — R809 Proteinuria, unspecified: Secondary | ICD-10-CM | POA: Diagnosis not present

## 2016-12-18 LAB — CUP PACEART REMOTE DEVICE CHECK
Date Time Interrogation Session: 20171226154150
MDC IDC PG IMPLANT DT: 20170530

## 2016-12-31 LAB — CUP PACEART REMOTE DEVICE CHECK
Date Time Interrogation Session: 20180125154324
MDC IDC PG IMPLANT DT: 20170530

## 2017-01-01 ENCOUNTER — Ambulatory Visit (INDEPENDENT_AMBULATORY_CARE_PROVIDER_SITE_OTHER): Payer: Medicare HMO | Admitting: *Deleted

## 2017-01-01 DIAGNOSIS — I639 Cerebral infarction, unspecified: Secondary | ICD-10-CM

## 2017-01-01 NOTE — Progress Notes (Signed)
Carelink Summary Report / Loop Recorder 

## 2017-01-19 LAB — CUP PACEART REMOTE DEVICE CHECK
Implantable Pulse Generator Implant Date: 20170530
MDC IDC SESS DTM: 20180224154426

## 2017-01-29 ENCOUNTER — Ambulatory Visit (INDEPENDENT_AMBULATORY_CARE_PROVIDER_SITE_OTHER): Payer: Medicare HMO | Admitting: *Deleted

## 2017-01-29 DIAGNOSIS — I639 Cerebral infarction, unspecified: Secondary | ICD-10-CM | POA: Diagnosis not present

## 2017-01-30 NOTE — Progress Notes (Signed)
Carelink Summary Report / Loop Recorder 

## 2017-02-14 LAB — CUP PACEART REMOTE DEVICE CHECK
Date Time Interrogation Session: 20180326160909
MDC IDC PG IMPLANT DT: 20170530

## 2017-02-26 DIAGNOSIS — E119 Type 2 diabetes mellitus without complications: Secondary | ICD-10-CM | POA: Diagnosis not present

## 2017-02-28 ENCOUNTER — Ambulatory Visit (INDEPENDENT_AMBULATORY_CARE_PROVIDER_SITE_OTHER): Payer: Medicare HMO | Admitting: *Deleted

## 2017-02-28 DIAGNOSIS — I639 Cerebral infarction, unspecified: Secondary | ICD-10-CM

## 2017-03-01 NOTE — Progress Notes (Signed)
Carelink Summary Report / Loop Recorder 

## 2017-03-21 LAB — CUP PACEART REMOTE DEVICE CHECK
Date Time Interrogation Session: 20180425164012
MDC IDC PG IMPLANT DT: 20170530

## 2017-03-30 ENCOUNTER — Ambulatory Visit (INDEPENDENT_AMBULATORY_CARE_PROVIDER_SITE_OTHER): Payer: Medicare HMO | Admitting: *Deleted

## 2017-03-30 DIAGNOSIS — I639 Cerebral infarction, unspecified: Secondary | ICD-10-CM | POA: Diagnosis not present

## 2017-03-30 NOTE — Progress Notes (Signed)
Carelink Summary Report / Loop Recorder 

## 2017-04-03 LAB — CUP PACEART REMOTE DEVICE CHECK
Implantable Pulse Generator Implant Date: 20170530
MDC IDC SESS DTM: 20180525164231

## 2017-04-30 ENCOUNTER — Ambulatory Visit (INDEPENDENT_AMBULATORY_CARE_PROVIDER_SITE_OTHER): Payer: Medicare HMO | Admitting: *Deleted

## 2017-04-30 DIAGNOSIS — I639 Cerebral infarction, unspecified: Secondary | ICD-10-CM

## 2017-04-30 NOTE — Progress Notes (Signed)
Carelink Summary Report / Loop Recorder 

## 2017-05-09 LAB — CUP PACEART REMOTE DEVICE CHECK
Implantable Pulse Generator Implant Date: 20170530
MDC IDC SESS DTM: 20180624174200

## 2017-05-09 NOTE — Progress Notes (Signed)
Carelink summary report received. Battery status OK. Normal device function. No new symptom episodes, tachy episodes, brady, or pause episodes. No new AF episodes. Monthly summary reports and ROV/PRN 

## 2017-05-29 ENCOUNTER — Ambulatory Visit (INDEPENDENT_AMBULATORY_CARE_PROVIDER_SITE_OTHER): Payer: Medicare HMO | Admitting: *Deleted

## 2017-05-29 DIAGNOSIS — I639 Cerebral infarction, unspecified: Secondary | ICD-10-CM | POA: Diagnosis not present

## 2017-05-30 DIAGNOSIS — R809 Proteinuria, unspecified: Secondary | ICD-10-CM | POA: Diagnosis not present

## 2017-05-30 DIAGNOSIS — D519 Vitamin B12 deficiency anemia, unspecified: Secondary | ICD-10-CM | POA: Diagnosis not present

## 2017-05-30 DIAGNOSIS — E1129 Type 2 diabetes mellitus with other diabetic kidney complication: Secondary | ICD-10-CM | POA: Diagnosis not present

## 2017-05-30 DIAGNOSIS — I10 Essential (primary) hypertension: Secondary | ICD-10-CM | POA: Diagnosis not present

## 2017-05-30 NOTE — Progress Notes (Signed)
Carelink Summary Report / Loop Recorder 

## 2017-06-06 DIAGNOSIS — E1129 Type 2 diabetes mellitus with other diabetic kidney complication: Secondary | ICD-10-CM | POA: Diagnosis not present

## 2017-06-06 DIAGNOSIS — D519 Vitamin B12 deficiency anemia, unspecified: Secondary | ICD-10-CM | POA: Diagnosis not present

## 2017-06-06 DIAGNOSIS — I10 Essential (primary) hypertension: Secondary | ICD-10-CM | POA: Diagnosis not present

## 2017-06-06 DIAGNOSIS — D692 Other nonthrombocytopenic purpura: Secondary | ICD-10-CM | POA: Insufficient documentation

## 2017-06-06 DIAGNOSIS — E784 Other hyperlipidemia: Secondary | ICD-10-CM | POA: Diagnosis not present

## 2017-06-06 DIAGNOSIS — I480 Paroxysmal atrial fibrillation: Secondary | ICD-10-CM | POA: Diagnosis not present

## 2017-06-06 DIAGNOSIS — I739 Peripheral vascular disease, unspecified: Secondary | ICD-10-CM | POA: Diagnosis not present

## 2017-06-06 DIAGNOSIS — Q211 Atrial septal defect: Secondary | ICD-10-CM | POA: Diagnosis not present

## 2017-06-06 DIAGNOSIS — E034 Atrophy of thyroid (acquired): Secondary | ICD-10-CM | POA: Diagnosis not present

## 2017-06-06 DIAGNOSIS — Z0000 Encounter for general adult medical examination without abnormal findings: Secondary | ICD-10-CM | POA: Diagnosis not present

## 2017-06-06 DIAGNOSIS — K219 Gastro-esophageal reflux disease without esophagitis: Secondary | ICD-10-CM | POA: Diagnosis not present

## 2017-06-14 LAB — CUP PACEART REMOTE DEVICE CHECK
Date Time Interrogation Session: 20180724200730
Implantable Pulse Generator Implant Date: 20170530

## 2017-06-28 ENCOUNTER — Ambulatory Visit (INDEPENDENT_AMBULATORY_CARE_PROVIDER_SITE_OTHER): Payer: Medicare HMO | Admitting: *Deleted

## 2017-06-28 DIAGNOSIS — I639 Cerebral infarction, unspecified: Secondary | ICD-10-CM

## 2017-06-29 NOTE — Progress Notes (Signed)
Carelink Summary Report / Loop Recorder 

## 2017-07-03 LAB — CUP PACEART REMOTE DEVICE CHECK
Implantable Pulse Generator Implant Date: 20170530
MDC IDC SESS DTM: 20180823201018

## 2017-07-03 NOTE — Progress Notes (Signed)
Carelink summary report received. Battery status OK. Normal device function. No new symptom episodes, tachy episodes, brady, or pause episodes. No new AF episodes. Monthly summary reports and ROV/PRN 

## 2017-07-06 DIAGNOSIS — I480 Paroxysmal atrial fibrillation: Secondary | ICD-10-CM | POA: Diagnosis not present

## 2017-07-06 DIAGNOSIS — I10 Essential (primary) hypertension: Secondary | ICD-10-CM | POA: Diagnosis not present

## 2017-07-11 ENCOUNTER — Telehealth: Payer: Self-pay | Admitting: Cardiology

## 2017-07-11 NOTE — Telephone Encounter (Signed)
LMOVM requesting that pt send manual transmission b/c home monitor has not updated in at least 14 days.    

## 2017-07-13 DIAGNOSIS — E784 Other hyperlipidemia: Secondary | ICD-10-CM | POA: Diagnosis not present

## 2017-07-13 DIAGNOSIS — I10 Essential (primary) hypertension: Secondary | ICD-10-CM | POA: Diagnosis not present

## 2017-07-13 DIAGNOSIS — Q211 Atrial septal defect: Secondary | ICD-10-CM | POA: Diagnosis not present

## 2017-07-13 DIAGNOSIS — E538 Deficiency of other specified B group vitamins: Secondary | ICD-10-CM | POA: Diagnosis not present

## 2017-07-13 DIAGNOSIS — F3342 Major depressive disorder, recurrent, in full remission: Secondary | ICD-10-CM | POA: Diagnosis not present

## 2017-07-13 DIAGNOSIS — D692 Other nonthrombocytopenic purpura: Secondary | ICD-10-CM | POA: Diagnosis not present

## 2017-07-13 DIAGNOSIS — E1129 Type 2 diabetes mellitus with other diabetic kidney complication: Secondary | ICD-10-CM | POA: Diagnosis not present

## 2017-07-13 DIAGNOSIS — E034 Atrophy of thyroid (acquired): Secondary | ICD-10-CM | POA: Diagnosis not present

## 2017-07-13 DIAGNOSIS — I739 Peripheral vascular disease, unspecified: Secondary | ICD-10-CM | POA: Diagnosis not present

## 2017-07-30 ENCOUNTER — Ambulatory Visit (INDEPENDENT_AMBULATORY_CARE_PROVIDER_SITE_OTHER): Payer: Medicare HMO | Admitting: *Deleted

## 2017-07-30 DIAGNOSIS — I639 Cerebral infarction, unspecified: Secondary | ICD-10-CM

## 2017-07-30 NOTE — Progress Notes (Signed)
Carelink Summary Report / Loop Recorder 

## 2017-07-31 LAB — CUP PACEART REMOTE DEVICE CHECK
MDC IDC PG IMPLANT DT: 20170530
MDC IDC SESS DTM: 20180922204435

## 2017-08-27 ENCOUNTER — Ambulatory Visit (INDEPENDENT_AMBULATORY_CARE_PROVIDER_SITE_OTHER): Payer: Medicare HMO | Admitting: *Deleted

## 2017-08-27 DIAGNOSIS — I639 Cerebral infarction, unspecified: Secondary | ICD-10-CM

## 2017-08-29 LAB — CUP PACEART REMOTE DEVICE CHECK
Date Time Interrogation Session: 20181022214113
MDC IDC PG IMPLANT DT: 20170530

## 2017-08-29 NOTE — Progress Notes (Signed)
Carelink Summary Report / Loop Recorder 

## 2017-09-26 ENCOUNTER — Ambulatory Visit (INDEPENDENT_AMBULATORY_CARE_PROVIDER_SITE_OTHER): Payer: Medicare HMO | Admitting: *Deleted

## 2017-09-26 DIAGNOSIS — I639 Cerebral infarction, unspecified: Secondary | ICD-10-CM | POA: Diagnosis not present

## 2017-10-01 NOTE — Progress Notes (Signed)
Carelink Summary Report / Loop Recorder 

## 2017-10-10 DIAGNOSIS — E1129 Type 2 diabetes mellitus with other diabetic kidney complication: Secondary | ICD-10-CM | POA: Diagnosis not present

## 2017-10-10 DIAGNOSIS — R809 Proteinuria, unspecified: Secondary | ICD-10-CM | POA: Diagnosis not present

## 2017-10-10 DIAGNOSIS — E034 Atrophy of thyroid (acquired): Secondary | ICD-10-CM | POA: Diagnosis not present

## 2017-10-10 DIAGNOSIS — E7849 Other hyperlipidemia: Secondary | ICD-10-CM | POA: Diagnosis not present

## 2017-10-10 DIAGNOSIS — E538 Deficiency of other specified B group vitamins: Secondary | ICD-10-CM | POA: Diagnosis not present

## 2017-10-10 LAB — CUP PACEART REMOTE DEVICE CHECK
Implantable Pulse Generator Implant Date: 20170530
MDC IDC SESS DTM: 20181121213830

## 2017-10-17 DIAGNOSIS — D692 Other nonthrombocytopenic purpura: Secondary | ICD-10-CM | POA: Diagnosis not present

## 2017-10-17 DIAGNOSIS — I480 Paroxysmal atrial fibrillation: Secondary | ICD-10-CM | POA: Diagnosis not present

## 2017-10-17 DIAGNOSIS — E1129 Type 2 diabetes mellitus with other diabetic kidney complication: Secondary | ICD-10-CM | POA: Diagnosis not present

## 2017-10-17 DIAGNOSIS — E034 Atrophy of thyroid (acquired): Secondary | ICD-10-CM | POA: Diagnosis not present

## 2017-10-17 DIAGNOSIS — I739 Peripheral vascular disease, unspecified: Secondary | ICD-10-CM | POA: Diagnosis not present

## 2017-10-17 DIAGNOSIS — K219 Gastro-esophageal reflux disease without esophagitis: Secondary | ICD-10-CM | POA: Diagnosis not present

## 2017-10-17 DIAGNOSIS — Q211 Atrial septal defect: Secondary | ICD-10-CM | POA: Diagnosis not present

## 2017-10-17 DIAGNOSIS — I38 Endocarditis, valve unspecified: Secondary | ICD-10-CM | POA: Diagnosis not present

## 2017-10-17 DIAGNOSIS — I10 Essential (primary) hypertension: Secondary | ICD-10-CM | POA: Diagnosis not present

## 2017-10-26 ENCOUNTER — Ambulatory Visit (INDEPENDENT_AMBULATORY_CARE_PROVIDER_SITE_OTHER): Payer: Medicare HMO | Admitting: *Deleted

## 2017-10-26 DIAGNOSIS — I639 Cerebral infarction, unspecified: Secondary | ICD-10-CM

## 2017-10-31 NOTE — Progress Notes (Signed)
Carelink Summary Report / Loop Recorder 

## 2017-11-08 LAB — CUP PACEART REMOTE DEVICE CHECK
Implantable Pulse Generator Implant Date: 20170530
MDC IDC SESS DTM: 20181221213901

## 2017-11-26 ENCOUNTER — Ambulatory Visit (INDEPENDENT_AMBULATORY_CARE_PROVIDER_SITE_OTHER): Payer: Medicare HMO | Admitting: *Deleted

## 2017-11-26 DIAGNOSIS — I639 Cerebral infarction, unspecified: Secondary | ICD-10-CM | POA: Diagnosis not present

## 2017-11-26 NOTE — Progress Notes (Signed)
Carelink Summary Report / Loop Recorder 

## 2017-12-03 LAB — CUP PACEART REMOTE DEVICE CHECK
Implantable Pulse Generator Implant Date: 20170530
MDC IDC SESS DTM: 20190120223840

## 2017-12-25 ENCOUNTER — Ambulatory Visit (INDEPENDENT_AMBULATORY_CARE_PROVIDER_SITE_OTHER): Payer: Medicare HMO | Admitting: *Deleted

## 2017-12-25 DIAGNOSIS — I639 Cerebral infarction, unspecified: Secondary | ICD-10-CM

## 2017-12-27 NOTE — Progress Notes (Signed)
Carelink Summary Report / Loop Recorder 

## 2018-01-28 ENCOUNTER — Ambulatory Visit (INDEPENDENT_AMBULATORY_CARE_PROVIDER_SITE_OTHER): Payer: Medicare HMO | Admitting: *Deleted

## 2018-01-28 DIAGNOSIS — I639 Cerebral infarction, unspecified: Secondary | ICD-10-CM | POA: Diagnosis not present

## 2018-01-28 LAB — CUP PACEART REMOTE DEVICE CHECK
MDC IDC PG IMPLANT DT: 20170530
MDC IDC SESS DTM: 20190220234040

## 2018-01-29 NOTE — Progress Notes (Signed)
Carelink Summary Report / Loop Recorder 

## 2018-03-04 ENCOUNTER — Ambulatory Visit (INDEPENDENT_AMBULATORY_CARE_PROVIDER_SITE_OTHER): Payer: Medicare HMO | Admitting: *Deleted

## 2018-03-04 DIAGNOSIS — I639 Cerebral infarction, unspecified: Secondary | ICD-10-CM | POA: Diagnosis not present

## 2018-03-04 NOTE — Progress Notes (Signed)
Carelink Summary Report / Loop Recorder 

## 2018-03-05 ENCOUNTER — Ambulatory Visit (INDEPENDENT_AMBULATORY_CARE_PROVIDER_SITE_OTHER): Payer: Medicare HMO | Admitting: Internal Medicine

## 2018-03-05 ENCOUNTER — Encounter: Payer: Self-pay | Admitting: Internal Medicine

## 2018-03-05 VITALS — BP 130/52 | Ht 65.0 in | Wt 140.8 lb

## 2018-03-05 DIAGNOSIS — I639 Cerebral infarction, unspecified: Secondary | ICD-10-CM | POA: Diagnosis not present

## 2018-03-05 DIAGNOSIS — Z959 Presence of cardiac and vascular implant and graft, unspecified: Secondary | ICD-10-CM

## 2018-03-05 DIAGNOSIS — I480 Paroxysmal atrial fibrillation: Secondary | ICD-10-CM | POA: Diagnosis not present

## 2018-03-05 MED ORDER — ASPIRIN EC 81 MG PO TBEC: 81.0000 mg | DELAYED_RELEASE_TABLET | Freq: Every day | ORAL | Status: DC

## 2018-03-05 NOTE — Patient Instructions (Addendum)
Medication Instructions: - Your physician has recommended you make the following change in your medication:  1) DECREASE Metoprolol succinate 200 mg- take 1/4 tablet (50 mg) until you are seen by Dr. Ramonita Lab 2) DECREASE aspirin to 81 mg- take 1 tablet by mouth once daily  Labwork: - none ordered  Procedures/Testing: - none ordered  Follow-Up: - Your physician wants you to follow-up in: 1 year with Dr. [name]. You will receive a reminder letter in the mail two months in advance. If you don't receive a letter, please call our office to schedule the follow-up appointment.   Any Additional Special Instructions Will Be Listed Below (If Applicable).     If you need a refill on your cardiac medications before your next appointment, please call your pharmacy.

## 2018-03-05 NOTE — Progress Notes (Signed)
Patient Care Team: Adin Hector, MD as PCP - General (Internal Medicine)   HPI  Erin Yates is a 72 y.o. female Seen in the past by Dr. Carlyn Reichert because of palpitations in the context of prior closure of the PFO and cryptogenic stroke.  TEE 4/17 was normal with Amplatzer closure device in good position she remains on aspirin 325  She has had 6 strokes. She has lost vision with his most recent stroke.  She has been doing better than she has a long time.  Energy level is better.  Minimal shortness of breath.  No chest pain no edema   there is no bleeding on aspirin.  No intercurrent neurological events.  She had bradycardia Dr. Caryn Section decreased her metoprolol from 200--100-    Past Medical History:  Diagnosis Date  . Anemia    past  hx  . Anxiety   . Depression   . Diabetes mellitus without complication ([place])   . Dysrhythmia    prior to PFO correction  . GERD (gastroesophageal reflux disease)   . Headache    before PFO closure  . Hypercholesteremia   . Hypertension   . Hypothyroidism   . 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 PFO (patent foramen ovale)    correction device placed 2010  . Right leg numbness    lower leg- residual from CVA  . Seasonal allergies   . Stroke [facility])    prior to PFO correction    Past Surgical History:  Procedure Laterality Date  . ABDOMINAL HYSTERECTOMY    . CARDIAC CATHETERIZATION     [facility]  . CATARACT EXTRACTION W/PHACO Right 09/01/2015   Procedure: CATARACT EXTRACTION PHACO AND INTRAOCULAR LENS PLACEMENT (IOC);  Surgeon: Leandrew Koyanagi, MD;  Location: [place];  Service: Ophthalmology;  Laterality: Right;  DIABETIC - oral meds  . CATARACT EXTRACTION W/PHACO Left 10/20/2015   Procedure: CATARACT EXTRACTION PHACO AND INTRAOCULAR LENS PLACEMENT (IOC);  Surgeon: Leandrew Koyanagi, MD;  Location: [place];  Service: Ophthalmology;  Laterality: Left;  DIABETIC - oral meds  . EP IMPLANTABLE  DEVICE N/A 04/04/2016   Procedure: Loop Recorder Insertion;  Surgeon: Deboraha Sprang, MD;  Location: [place] CV LAB;  Service: Cardiovascular;  Laterality: N/A;  . FOOT NEUROMA SURGERY Left   . NASAL SINUS SURGERY    . PATENT FORAMEN OVALE CLOSURE    . PATENT FORAMEN OVALE CLOSURE    . TEE WITHOUT CARDIOVERSION N/A 02/09/2016   Procedure: TRANSESOPHAGEAL ECHOCARDIOGRAM (TEE);  Surgeon: Adrian Prows, MD;  Location: [facility] ENDOSCOPY;  Service: Cardiovascular;  Laterality: N/A;    Current Outpatient Medications  Medication Sig Dispense Refill  . ALPRAZolam (XANAX) 0.25 MG tablet Take 0.25 mg by mouth 2 (two) times daily as needed for anxiety.    Marland Kitchen amLODipine (NORVASC) 10 MG tablet Take 10 mg by mouth daily. PM    . aspirin 325 MG tablet Take 325 mg by mouth daily.    Marland Kitchen atorvastatin (LIPITOR) 80 MG tablet Take 80 mg by mouth daily. PM    . buPROPion (WELLBUTRIN SR) 150 MG 12 hr tablet Take 1 tablet (150 mg total) by mouth 2 (two) times daily. 60 tablet 5  . diltiazem (CARDIZEM) 30 MG tablet Cardizem 30mg  -- take 1 tablet every 4 hours AS NEEDED for heart rate >100 as long as blood pressure >100. 45 tablet 1  . ergocalciferol (VITAMIN D2) 50000 UNITS capsule Take  50,000 Units by mouth once a week.    . fluticasone (FLONASE) 50 MCG/ACT nasal spray Place into both nostrils daily as needed for allergies or rhinitis.    Marland Kitchen levothyroxine (SYNTHROID, LEVOTHROID) 50 MCG tablet Take 50 mcg by mouth daily before breakfast.    . losartan (COZAAR) 100 MG tablet Take 100 mg by mouth daily. AM    . metFORMIN (GLUCOPHAGE) 1000 MG tablet Take 1,000 mg by mouth 2 (two) times daily with a meal.    . metoprolol (TOPROL-XL) 200 MG 24 hr tablet Take 100 mg by mouth daily. AM     . pantoprazole (PROTONIX) 40 MG tablet Take 40 mg by mouth 2 (two) times daily. Reported on 04/17/2016    . venlafaxine XR (EFFEXOR-XR) 150 MG 24 hr capsule Take 150 mg by mouth daily with breakfast.    . vitamin B-12 (CYANOCOBALAMIN) 500 MCG  tablet Take 500 mcg by mouth daily. AM     No current facility-administered medications for this visit.     Allergies  Allergen Reactions  . Alendronate Other (See Comments)  . Penicillins Rash    Also swelling and itching      Review of Systems negative except from HPI and PMH  Physical Exam BP (!) 130/52 (BP Location: Left Arm, Patient Position: Sitting, Cuff Size: Normal)   Ht 5\' 5"  (1.651 m)   Wt 140 lb 12 oz (63.8 kg)   BMI 23.42 kg/m  Well developed and nourished in no acute distress HENT normal Neck supple with JVP-flat Clear Regular rate and rhythm, no murmurs or gallops Abd-soft with active BS No Clubbing cyanosis edema Skin-warm and dry A & Oriented  Grossly normal sensory and motor function  ECG sinus at 49 Intervals 23/08/48  Assessment and  Plan\ Cryptogenic stroke  Hypertension  Subclinical atrial fibrillation  Sinus bradycardia  PFO-Amplatzer closure    Blood pressure reasonably controlled  Heart rate is slow.  Not withstanding the fact that she is doing so well, with the degree of bradycardia, I will have her decrease her metoprolol from 100--50.  She is to see Dr. Caryn Section in a couple weeks and he can make a final decision as to whether to keep her on the 71  I have reached out to Dr. Billee Cashing regarding the recommended dose for aspirin with Amplatzer occluder.  The original trial published in the [facility] describes using 81--325.   His rec is 47 I could not find a specific recommendation of the [employer]  Reviewing the old chart she had an episode of atrial fibrillation of 30 minutes duration 8/17.  It was elected at that time to try her on apixaban; she could not afford it.  I do not know that that the right thing.  In the world of SCAF, and [place] AF the presumption is that anticoagulation is indicated.  We are not sure of these yet.  Furthermore, she needs aspirin therapy probably for her Amplatzer plug.  Hence, I think the data weigh in favor  of aspirin Once we get some feedback I will forward it to the patient.  My inclination would be to use 81  We spent more than 50% of our >17min visit in face to face counseling regarding the above

## 2018-03-09 LAB — CUP PACEART REMOTE DEVICE CHECK
Date Time Interrogation Session: 20190326000648
MDC IDC PG IMPLANT DT: 20170530

## 2018-03-12 DIAGNOSIS — E034 Atrophy of thyroid (acquired): Secondary | ICD-10-CM | POA: Diagnosis not present

## 2018-03-12 DIAGNOSIS — R809 Proteinuria, unspecified: Secondary | ICD-10-CM | POA: Diagnosis not present

## 2018-03-12 DIAGNOSIS — I739 Peripheral vascular disease, unspecified: Secondary | ICD-10-CM | POA: Diagnosis not present

## 2018-03-12 DIAGNOSIS — E538 Deficiency of other specified B group vitamins: Secondary | ICD-10-CM | POA: Diagnosis not present

## 2018-03-12 DIAGNOSIS — E1129 Type 2 diabetes mellitus with other diabetic kidney complication: Secondary | ICD-10-CM | POA: Diagnosis not present

## 2018-03-12 DIAGNOSIS — I10 Essential (primary) hypertension: Secondary | ICD-10-CM | POA: Diagnosis not present

## 2018-03-19 DIAGNOSIS — E034 Atrophy of thyroid (acquired): Secondary | ICD-10-CM | POA: Diagnosis not present

## 2018-03-19 DIAGNOSIS — I480 Paroxysmal atrial fibrillation: Secondary | ICD-10-CM | POA: Diagnosis not present

## 2018-03-19 DIAGNOSIS — I38 Endocarditis, valve unspecified: Secondary | ICD-10-CM | POA: Diagnosis not present

## 2018-03-19 DIAGNOSIS — E1129 Type 2 diabetes mellitus with other diabetic kidney complication: Secondary | ICD-10-CM | POA: Diagnosis not present

## 2018-03-19 DIAGNOSIS — Q211 Atrial septal defect: Secondary | ICD-10-CM | POA: Diagnosis not present

## 2018-03-19 DIAGNOSIS — D692 Other nonthrombocytopenic purpura: Secondary | ICD-10-CM | POA: Diagnosis not present

## 2018-03-19 DIAGNOSIS — K219 Gastro-esophageal reflux disease without esophagitis: Secondary | ICD-10-CM | POA: Diagnosis not present

## 2018-03-19 DIAGNOSIS — I10 Essential (primary) hypertension: Secondary | ICD-10-CM | POA: Diagnosis not present

## 2018-03-19 DIAGNOSIS — I739 Peripheral vascular disease, unspecified: Secondary | ICD-10-CM | POA: Diagnosis not present

## 2018-03-21 ENCOUNTER — Telehealth: Payer: Self-pay | Admitting: Cardiology

## 2018-03-21 NOTE — Telephone Encounter (Signed)
Spoke w/ pt and requested that she send a manual transmission b/c her home monitor has not updated in at least 14 days.   

## 2018-03-27 LAB — CUP PACEART REMOTE DEVICE CHECK
MDC IDC PG IMPLANT DT: 20170530
MDC IDC SESS DTM: 20190428001151

## 2018-04-04 ENCOUNTER — Ambulatory Visit (INDEPENDENT_AMBULATORY_CARE_PROVIDER_SITE_OTHER): Payer: Medicare HMO | Admitting: *Deleted

## 2018-04-04 DIAGNOSIS — I639 Cerebral infarction, unspecified: Secondary | ICD-10-CM

## 2018-04-05 NOTE — Progress Notes (Signed)
Carelink Summary Report / Loop Recorder 

## 2018-04-23 ENCOUNTER — Telehealth: Payer: Self-pay | Admitting: Cardiology

## 2018-04-23 NOTE — Telephone Encounter (Signed)
LMOVM requesting that pt send manual transmission b/c home monitor has not updated in at least 14 days.    

## 2018-04-29 LAB — CUP PACEART REMOTE DEVICE CHECK
Implantable Pulse Generator Implant Date: 20170530
MDC IDC SESS DTM: 20190531003843

## 2018-05-07 ENCOUNTER — Ambulatory Visit (INDEPENDENT_AMBULATORY_CARE_PROVIDER_SITE_OTHER): Payer: Medicare HMO | Admitting: *Deleted

## 2018-05-07 DIAGNOSIS — I639 Cerebral infarction, unspecified: Secondary | ICD-10-CM

## 2018-05-08 NOTE — Progress Notes (Signed)
Carelink Summary Report / Loop Recorder 

## 2018-06-10 ENCOUNTER — Ambulatory Visit (INDEPENDENT_AMBULATORY_CARE_PROVIDER_SITE_OTHER): Payer: Medicare HMO | Admitting: *Deleted

## 2018-06-10 DIAGNOSIS — I639 Cerebral infarction, unspecified: Secondary | ICD-10-CM | POA: Diagnosis not present

## 2018-06-11 NOTE — Progress Notes (Signed)
Carelink Summary Report / Loop Recorder 

## 2018-06-13 LAB — CUP PACEART REMOTE DEVICE CHECK
Date Time Interrogation Session: 20190703004015
MDC IDC PG IMPLANT DT: 20170530

## 2018-06-21 DIAGNOSIS — E1129 Type 2 diabetes mellitus with other diabetic kidney complication: Secondary | ICD-10-CM | POA: Diagnosis not present

## 2018-06-21 DIAGNOSIS — R809 Proteinuria, unspecified: Secondary | ICD-10-CM | POA: Diagnosis not present

## 2018-06-21 DIAGNOSIS — I10 Essential (primary) hypertension: Secondary | ICD-10-CM | POA: Diagnosis not present

## 2018-06-21 DIAGNOSIS — E538 Deficiency of other specified B group vitamins: Secondary | ICD-10-CM | POA: Diagnosis not present

## 2018-06-25 ENCOUNTER — Telehealth: Payer: Self-pay | Admitting: Cardiology

## 2018-06-25 NOTE — Telephone Encounter (Signed)
Spoke w/ pt and requested that she send a manual transmission b/c her home monitor has not updated in at least 14 days.   

## 2018-06-28 DIAGNOSIS — E034 Atrophy of thyroid (acquired): Secondary | ICD-10-CM | POA: Diagnosis not present

## 2018-06-28 DIAGNOSIS — I739 Peripheral vascular disease, unspecified: Secondary | ICD-10-CM | POA: Diagnosis not present

## 2018-06-28 DIAGNOSIS — K219 Gastro-esophageal reflux disease without esophagitis: Secondary | ICD-10-CM | POA: Diagnosis not present

## 2018-06-28 DIAGNOSIS — I38 Endocarditis, valve unspecified: Secondary | ICD-10-CM | POA: Diagnosis not present

## 2018-06-28 DIAGNOSIS — E1129 Type 2 diabetes mellitus with other diabetic kidney complication: Secondary | ICD-10-CM | POA: Diagnosis not present

## 2018-06-28 DIAGNOSIS — Z0000 Encounter for general adult medical examination without abnormal findings: Secondary | ICD-10-CM | POA: Diagnosis not present

## 2018-06-28 DIAGNOSIS — I10 Essential (primary) hypertension: Secondary | ICD-10-CM | POA: Diagnosis not present

## 2018-06-28 DIAGNOSIS — D692 Other nonthrombocytopenic purpura: Secondary | ICD-10-CM | POA: Diagnosis not present

## 2018-06-28 DIAGNOSIS — I480 Paroxysmal atrial fibrillation: Secondary | ICD-10-CM | POA: Diagnosis not present

## 2018-06-28 DIAGNOSIS — E7849 Other hyperlipidemia: Secondary | ICD-10-CM | POA: Diagnosis not present

## 2018-07-01 ENCOUNTER — Other Ambulatory Visit: Payer: Self-pay | Admitting: Internal Medicine

## 2018-07-01 ENCOUNTER — Telehealth: Payer: Self-pay | Admitting: *Deleted

## 2018-07-01 DIAGNOSIS — Z122 Encounter for screening for malignant neoplasm of respiratory organs: Secondary | ICD-10-CM

## 2018-07-01 DIAGNOSIS — Z1231 Encounter for screening mammogram for malignant neoplasm of breast: Secondary | ICD-10-CM

## 2018-07-01 NOTE — Telephone Encounter (Signed)
Received a referral for initial lung cancer screening scan.  Contacted the patient and obtained their smoking history, current smoker, 55pkyr history   as well as answering questions related to screening process.  Patient denies signs of lung cancer such as weight loss or hemoptysis at this time.  Patient denies comorbidity that would prevent curative treatment if lung cancer were found.  Patient is scheduled for the Shared Decision Making Visit and CT scan on Tuesday 07-23-18 at 1400.

## 2018-07-10 ENCOUNTER — Telehealth: Payer: Self-pay | Admitting: Cardiology

## 2018-07-10 NOTE — Telephone Encounter (Signed)
Spoke w/ pt and requested that she send a manual transmission b/c her home monitor has not updated in at least 14 days.   

## 2018-07-12 ENCOUNTER — Ambulatory Visit (INDEPENDENT_AMBULATORY_CARE_PROVIDER_SITE_OTHER): Payer: Medicare HMO | Admitting: *Deleted

## 2018-07-12 DIAGNOSIS — I639 Cerebral infarction, unspecified: Secondary | ICD-10-CM | POA: Diagnosis not present

## 2018-07-13 NOTE — Progress Notes (Signed)
Carelink Summary Report / Loop Recorder 

## 2018-07-17 ENCOUNTER — Ambulatory Visit
Admission: RE | Admit: 2018-07-17 | Discharge: 2018-07-17 | Disposition: A | Discharge status: Home / Self Care | Payer: Medicare HMO | Source: Ambulatory Visit | Attending: Internal Medicine | Admitting: Internal Medicine

## 2018-07-17 DIAGNOSIS — Z1231 Encounter for screening mammogram for malignant neoplasm of breast: Secondary | ICD-10-CM | POA: Insufficient documentation

## 2018-07-22 LAB — CUP PACEART REMOTE DEVICE CHECK
Date Time Interrogation Session: 20190805013906
Implantable Pulse Generator Implant Date: 20170530

## 2018-07-23 ENCOUNTER — Ambulatory Visit
Admission: RE | Admit: 2018-07-23 | Discharge: 2018-07-23 | Disposition: A | Discharge status: Home / Self Care | Payer: Medicare HMO | Source: Ambulatory Visit | Attending: Oncology | Admitting: Oncology

## 2018-07-23 ENCOUNTER — Inpatient Hospital Stay: Payer: Medicare HMO | Attending: Oncology | Admitting: Oncology

## 2018-07-23 ENCOUNTER — Telehealth: Payer: Self-pay | Admitting: Internal Medicine

## 2018-07-23 ENCOUNTER — Encounter: Payer: Self-pay | Admitting: Oncology

## 2018-07-23 DIAGNOSIS — F17210 Nicotine dependence, cigarettes, uncomplicated: Secondary | ICD-10-CM | POA: Diagnosis not present

## 2018-07-23 DIAGNOSIS — Z87891 Personal history of nicotine dependence: Secondary | ICD-10-CM

## 2018-07-23 DIAGNOSIS — Z122 Encounter for screening for malignant neoplasm of respiratory organs: Secondary | ICD-10-CM | POA: Diagnosis not present

## 2018-07-23 DIAGNOSIS — I700 Atherosclerosis of aorta: Secondary | ICD-10-CM | POA: Diagnosis not present

## 2018-07-23 DIAGNOSIS — J439 Emphysema, unspecified: Secondary | ICD-10-CM | POA: Diagnosis not present

## 2018-07-23 NOTE — Progress Notes (Signed)
In accordance with CMS guidelines, patient has met eligibility criteria including age, absence of signs or symptoms of lung cancer.  Social History   Tobacco Use  . Smoking status: Current Every Day Smoker    Packs/day: 1.00    Years: 55.00    Pack years: 55.00  . Smokeless tobacco: Never Used  Substance Use Topics  . Alcohol use: No  . Drug use: No     A shared decision-making session was conducted prior to the performance of CT scan. This includes one or more decision aids, includes benefits and harms of screening, follow-up diagnostic testing, over-diagnosis, false positive rate, and total radiation exposure.  Counseling on the importance of adherence to annual lung cancer LDCT screening, impact of co-morbidities, and ability or willingness to undergo diagnosis and treatment is imperative for compliance of the program.  Counseling on the importance of continued smoking cessation for former smokers; the importance of smoking cessation for current smokers, and information about tobacco cessation interventions have been given to patient including [place] and [address] quit [place] programs.  Written order for lung cancer screening with LDCT has been given to the patient and any and all questions have been answered to the best of my abilities.   Yearly follow up will be coordinated by Burgess Estelle, Thoracic Navigator.  Faythe Casa, NP 07/23/2018 2:37 PM

## 2018-07-23 NOTE — Telephone Encounter (Signed)
To device clinic to review.

## 2018-07-23 NOTE — Telephone Encounter (Signed)
Patient came by office States that her loop recorder has not been working properly, has had to send manual transmissions often Would like to discuss with nurse

## 2018-07-23 NOTE — Telephone Encounter (Signed)
Patient states that she's having to send manual transmissions quite often d/t a lack of cell signal in the area. Monitor troubleshooting request form submitted in [facility]. Patient aware. Verbalized understanding.

## 2018-07-24 ENCOUNTER — Encounter: Payer: Self-pay | Admitting: *Deleted

## 2018-07-26 DIAGNOSIS — E034 Atrophy of thyroid (acquired): Secondary | ICD-10-CM | POA: Diagnosis not present

## 2018-07-26 DIAGNOSIS — K219 Gastro-esophageal reflux disease without esophagitis: Secondary | ICD-10-CM | POA: Diagnosis not present

## 2018-07-26 DIAGNOSIS — I480 Paroxysmal atrial fibrillation: Secondary | ICD-10-CM | POA: Diagnosis not present

## 2018-07-26 DIAGNOSIS — E538 Deficiency of other specified B group vitamins: Secondary | ICD-10-CM | POA: Diagnosis not present

## 2018-07-26 DIAGNOSIS — E1129 Type 2 diabetes mellitus with other diabetic kidney complication: Secondary | ICD-10-CM | POA: Diagnosis not present

## 2018-07-26 DIAGNOSIS — I739 Peripheral vascular disease, unspecified: Secondary | ICD-10-CM | POA: Diagnosis not present

## 2018-07-26 DIAGNOSIS — I10 Essential (primary) hypertension: Secondary | ICD-10-CM | POA: Diagnosis not present

## 2018-07-26 DIAGNOSIS — D692 Other nonthrombocytopenic purpura: Secondary | ICD-10-CM | POA: Diagnosis not present

## 2018-07-26 DIAGNOSIS — D519 Vitamin B12 deficiency anemia, unspecified: Secondary | ICD-10-CM | POA: Diagnosis not present

## 2018-07-29 NOTE — Telephone Encounter (Signed)
Transmission received on 07/29/18 no new events

## 2018-08-02 LAB — CUP PACEART REMOTE DEVICE CHECK
Date Time Interrogation Session: 20190907021200
MDC IDC PG IMPLANT DT: 20170530

## 2018-08-15 ENCOUNTER — Ambulatory Visit (INDEPENDENT_AMBULATORY_CARE_PROVIDER_SITE_OTHER): Payer: Medicare HMO | Admitting: *Deleted

## 2018-08-15 DIAGNOSIS — I639 Cerebral infarction, unspecified: Secondary | ICD-10-CM

## 2018-08-15 NOTE — Progress Notes (Signed)
Carelink Summary Report / Loop Recorder 

## 2018-08-29 ENCOUNTER — Encounter: Payer: Self-pay | Admitting: Internal Medicine

## 2018-08-29 NOTE — Progress Notes (Unsigned)
Patient on loop recorder showed to have atrial fibrillation.  She is status post PFO closure.  Her rope score is less than 3 suggesting that her attributable PFO risk is close to 0.  Hence, we should probably presume that it is more likely related to her currently identified atrial fibrillation and we will need to review again need for anticoagulation  I will send this to Dr. Caryn Section at [facility].  Would be my recommendation that we resume anticoagulation and discontinue aspirin.Erin Yates

## 2018-08-30 LAB — CUP PACEART REMOTE DEVICE CHECK
Date Time Interrogation Session: 20191010023532
MDC IDC PG IMPLANT DT: 20170530

## 2018-09-16 ENCOUNTER — Ambulatory Visit (INDEPENDENT_AMBULATORY_CARE_PROVIDER_SITE_OTHER): Payer: Medicare HMO | Admitting: *Deleted

## 2018-09-16 DIAGNOSIS — I639 Cerebral infarction, unspecified: Secondary | ICD-10-CM | POA: Diagnosis not present

## 2018-09-17 DIAGNOSIS — L820 Inflamed seborrheic keratosis: Secondary | ICD-10-CM | POA: Diagnosis not present

## 2018-09-17 DIAGNOSIS — D692 Other nonthrombocytopenic purpura: Secondary | ICD-10-CM | POA: Diagnosis not present

## 2018-09-17 DIAGNOSIS — L821 Other seborrheic keratosis: Secondary | ICD-10-CM | POA: Diagnosis not present

## 2018-09-17 NOTE — Progress Notes (Signed)
Carelink Summary Report / Loop Recorder 

## 2018-10-17 ENCOUNTER — Other Ambulatory Visit: Payer: Self-pay | Admitting: Internal Medicine

## 2018-10-21 ENCOUNTER — Ambulatory Visit (INDEPENDENT_AMBULATORY_CARE_PROVIDER_SITE_OTHER): Payer: Medicare HMO

## 2018-10-21 DIAGNOSIS — I639 Cerebral infarction, unspecified: Secondary | ICD-10-CM

## 2018-10-22 NOTE — Progress Notes (Signed)
Carelink Summary Report / Loop Recorder 

## 2018-11-02 LAB — CUP PACEART REMOTE DEVICE CHECK
Implantable Pulse Generator Implant Date: 20170530
MDC IDC SESS DTM: 20191112024057

## 2018-11-20 DIAGNOSIS — E7849 Other hyperlipidemia: Secondary | ICD-10-CM | POA: Diagnosis not present

## 2018-11-20 DIAGNOSIS — D692 Other nonthrombocytopenic purpura: Secondary | ICD-10-CM | POA: Diagnosis not present

## 2018-11-20 DIAGNOSIS — E1129 Type 2 diabetes mellitus with other diabetic kidney complication: Secondary | ICD-10-CM | POA: Diagnosis not present

## 2018-11-20 DIAGNOSIS — D519 Vitamin B12 deficiency anemia, unspecified: Secondary | ICD-10-CM | POA: Diagnosis not present

## 2018-11-20 DIAGNOSIS — R809 Proteinuria, unspecified: Secondary | ICD-10-CM | POA: Diagnosis not present

## 2018-11-21 ENCOUNTER — Ambulatory Visit: Payer: Medicare HMO

## 2018-11-23 LAB — CUP PACEART REMOTE DEVICE CHECK
Implantable Pulse Generator Implant Date: 20170530
MDC IDC SESS DTM: 20200117023706

## 2018-11-24 LAB — CUP PACEART REMOTE DEVICE CHECK
MDC IDC PG IMPLANT DT: 20170530
MDC IDC SESS DTM: 20191215023532

## 2018-11-27 DIAGNOSIS — I38 Endocarditis, valve unspecified: Secondary | ICD-10-CM | POA: Diagnosis not present

## 2018-11-27 DIAGNOSIS — D692 Other nonthrombocytopenic purpura: Secondary | ICD-10-CM | POA: Diagnosis not present

## 2018-11-27 DIAGNOSIS — E1129 Type 2 diabetes mellitus with other diabetic kidney complication: Secondary | ICD-10-CM | POA: Diagnosis not present

## 2018-11-27 DIAGNOSIS — I480 Paroxysmal atrial fibrillation: Secondary | ICD-10-CM | POA: Diagnosis not present

## 2018-11-27 DIAGNOSIS — I739 Peripheral vascular disease, unspecified: Secondary | ICD-10-CM | POA: Diagnosis not present

## 2018-11-27 DIAGNOSIS — Q211 Atrial septal defect: Secondary | ICD-10-CM | POA: Diagnosis not present

## 2018-11-27 DIAGNOSIS — K219 Gastro-esophageal reflux disease without esophagitis: Secondary | ICD-10-CM | POA: Diagnosis not present

## 2018-11-27 DIAGNOSIS — E034 Atrophy of thyroid (acquired): Secondary | ICD-10-CM | POA: Diagnosis not present

## 2018-11-27 DIAGNOSIS — I10 Essential (primary) hypertension: Secondary | ICD-10-CM | POA: Diagnosis not present

## 2018-12-04 DIAGNOSIS — M8588 Other specified disorders of bone density and structure, other site: Secondary | ICD-10-CM | POA: Diagnosis not present

## 2018-12-20 ENCOUNTER — Telehealth: Payer: Self-pay

## 2018-12-20 NOTE — Telephone Encounter (Signed)
Spoke with pt and she agreed to send a manual transmission.

## 2018-12-24 ENCOUNTER — Ambulatory Visit (INDEPENDENT_AMBULATORY_CARE_PROVIDER_SITE_OTHER): Payer: Medicare HMO

## 2018-12-24 DIAGNOSIS — I639 Cerebral infarction, unspecified: Secondary | ICD-10-CM | POA: Diagnosis not present

## 2018-12-25 NOTE — Telephone Encounter (Signed)
LVM with pt for return call.

## 2018-12-25 NOTE — Telephone Encounter (Signed)
Pt agreed to and will see Roderic Palau NP on Monday 2/24 to discuss [facility] per Dr Olin Pia request.

## 2018-12-26 ENCOUNTER — Other Ambulatory Visit: Payer: Self-pay | Admitting: Internal Medicine

## 2018-12-26 LAB — CUP PACEART REMOTE DEVICE CHECK
Implantable Pulse Generator Implant Date: 20170530
MDC IDC SESS DTM: 20200219023557

## 2018-12-30 ENCOUNTER — Ambulatory Visit (HOSPITAL_COMMUNITY): Payer: Medicare HMO | Admitting: Nurse Practitioner

## 2019-01-01 ENCOUNTER — Encounter (HOSPITAL_COMMUNITY): Payer: Self-pay | Admitting: Nurse Practitioner

## 2019-01-01 ENCOUNTER — Ambulatory Visit (HOSPITAL_COMMUNITY)
Admission: RE | Admit: 2019-01-01 | Discharge: 2019-01-01 | Disposition: A | Discharge status: Home / Self Care | Payer: Medicare HMO | Source: Ambulatory Visit | Attending: Nurse Practitioner | Admitting: Nurse Practitioner

## 2019-01-01 VITALS — BP 146/72 | HR 54 | Ht 65.0 in | Wt 136.0 lb

## 2019-01-01 DIAGNOSIS — E039 Hypothyroidism, unspecified: Secondary | ICD-10-CM | POA: Insufficient documentation

## 2019-01-01 DIAGNOSIS — E119 Type 2 diabetes mellitus without complications: Secondary | ICD-10-CM | POA: Insufficient documentation

## 2019-01-01 DIAGNOSIS — K219 Gastro-esophageal reflux disease without esophagitis: Secondary | ICD-10-CM | POA: Diagnosis not present

## 2019-01-01 DIAGNOSIS — I10 Essential (primary) hypertension: Secondary | ICD-10-CM | POA: Diagnosis not present

## 2019-01-01 DIAGNOSIS — R9431 Abnormal electrocardiogram [ECG] [EKG]: Secondary | ICD-10-CM | POA: Diagnosis not present

## 2019-01-01 DIAGNOSIS — E7800 Pure hypercholesterolemia, unspecified: Secondary | ICD-10-CM | POA: Diagnosis not present

## 2019-01-01 DIAGNOSIS — Z7984 Long term (current) use of oral hypoglycemic drugs: Secondary | ICD-10-CM | POA: Diagnosis not present

## 2019-01-01 DIAGNOSIS — R001 Bradycardia, unspecified: Secondary | ICD-10-CM | POA: Diagnosis not present

## 2019-01-01 DIAGNOSIS — Z880 Allergy status to penicillin: Secondary | ICD-10-CM | POA: Diagnosis not present

## 2019-01-01 DIAGNOSIS — F329 Major depressive disorder, single episode, unspecified: Secondary | ICD-10-CM | POA: Diagnosis not present

## 2019-01-01 DIAGNOSIS — Z888 Allergy status to other drugs, medicaments and biological substances status: Secondary | ICD-10-CM | POA: Diagnosis not present

## 2019-01-01 DIAGNOSIS — I480 Paroxysmal atrial fibrillation: Secondary | ICD-10-CM | POA: Diagnosis not present

## 2019-01-01 DIAGNOSIS — Z803 Family history of malignant neoplasm of breast: Secondary | ICD-10-CM | POA: Diagnosis not present

## 2019-01-01 DIAGNOSIS — F419 Anxiety disorder, unspecified: Secondary | ICD-10-CM | POA: Diagnosis not present

## 2019-01-01 DIAGNOSIS — Z79890 Hormone replacement therapy (postmenopausal): Secondary | ICD-10-CM | POA: Diagnosis not present

## 2019-01-01 DIAGNOSIS — Z79899 Other long term (current) drug therapy: Secondary | ICD-10-CM | POA: Insufficient documentation

## 2019-01-01 DIAGNOSIS — Z7901 Long term (current) use of anticoagulants: Secondary | ICD-10-CM | POA: Diagnosis not present

## 2019-01-01 DIAGNOSIS — F17200 Nicotine dependence, unspecified, uncomplicated: Secondary | ICD-10-CM | POA: Diagnosis not present

## 2019-01-01 MED ORDER — APIXABAN 5 MG PO TABS: 5.0000 mg | ORAL_TABLET | Freq: Two times a day (BID) | ORAL | 2 refills | Status: DC

## 2019-01-01 MED ORDER — APIXABAN 5 MG PO TABS: 5.0000 mg | ORAL_TABLET | Freq: Two times a day (BID) | ORAL | 0 refills | Status: DC

## 2019-01-01 NOTE — Progress Notes (Signed)
Carelink Summary Report / Loop Recorder 

## 2019-01-01 NOTE — Progress Notes (Signed)
Patient ID: Erin Yates, female   DOB: 1946/06/05, 73 y.o.   MRN: [id number]   Primary Care Physician: Adin Hector, MD Referring Physician:Dr. Jolyn Nap, Device clinic   Erin Yates is a 73 y.o. female with a h/o tobacco abuse, DM, HTN, hypothyroidism, CVA x 6 in the past as well as a remote PFO closure. Her last cryptogenic stroke was in April of 2017 and a LinQ monitor was placed 5/30. Afib was identified 06/09/16 which showed 30 mins of afib, from which pt was aware of the irregular heart beat. She was referred to the afib clinic and was started on eliquis at that time.  She is now back in the clinic 01/01/19, for device clinic  seeing more afib on the Linq monitor and Dr. [name] asked pt to return here to get back on eliquis. The pt states that she stopped eliquis several months after starting years ago because of the cost. Her daughter is here with her today and is now pre pouring her meds and monitoring  to make sure she is complaint with her medications. She is on daily BB. She is in SR today.  No alcohol/caffeine/ + for a pack of cigarettes a day.  Today, she denies symptoms of chest pain, shortness of breath, orthopnea, PND, lower extremity edema, dizziness, presyncope, syncope, or neurologic sequela. The patient is tolerating medications without difficulties and is otherwise without complaint today.   Past Medical History:  Diagnosis Date  . Anemia    past  hx  . Anxiety   . Depression   . Diabetes mellitus without complication ([place])   . Dysrhythmia    prior to PFO correction  . GERD (gastroesophageal reflux disease)   . Headache    before PFO closure  . Hypercholesteremia   . Hypertension   . Hypothyroidism   . 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 PFO (patent foramen ovale)    correction device placed 2010  . Right leg numbness    lower leg- residual from CVA  . Seasonal allergies   . Stroke [facility])    prior to PFO correction   Past Surgical History:   Procedure Laterality Date  . ABDOMINAL HYSTERECTOMY    . CARDIAC CATHETERIZATION     [facility]  . CATARACT EXTRACTION W/PHACO Right 09/01/2015   Procedure: CATARACT EXTRACTION PHACO AND INTRAOCULAR LENS PLACEMENT (IOC);  Surgeon: Leandrew Koyanagi, MD;  Location: [place];  Service: Ophthalmology;  Laterality: Right;  DIABETIC - oral meds  . CATARACT EXTRACTION W/PHACO Left 10/20/2015   Procedure: CATARACT EXTRACTION PHACO AND INTRAOCULAR LENS PLACEMENT (IOC);  Surgeon: Leandrew Koyanagi, MD;  Location: [place];  Service: Ophthalmology;  Laterality: Left;  DIABETIC - oral meds  . EP IMPLANTABLE DEVICE N/A 04/04/2016   Procedure: Loop Recorder Insertion;  Surgeon: Deboraha Sprang, MD;  Location: [place] CV LAB;  Service: Cardiovascular;  Laterality: N/A;  . FOOT NEUROMA SURGERY Left   . NASAL SINUS SURGERY    . PATENT FORAMEN OVALE CLOSURE    . PATENT FORAMEN OVALE CLOSURE    . TEE WITHOUT CARDIOVERSION N/A 02/09/2016   Procedure: TRANSESOPHAGEAL ECHOCARDIOGRAM (TEE);  Surgeon: Adrian Prows, MD;  Location: [facility] ENDOSCOPY;  Service: Cardiovascular;  Laterality: N/A;    Current Outpatient Medications  Medication Sig Dispense Refill  . ALPRAZolam (XANAX) 0.25 MG tablet Take 0.25 mg by mouth 2 (two) times daily as needed for anxiety.    Marland Kitchen amLODipine (  NORVASC) 10 MG tablet Take 10 mg by mouth daily. PM    . atorvastatin (LIPITOR) 80 MG tablet Take 80 mg by mouth daily. PM    . buPROPion (WELLBUTRIN SR) 150 MG 12 hr tablet Take 1 tablet (150 mg total) by mouth 2 (two) times daily. 60 tablet 5  . diltiazem (CARDIZEM) 30 MG tablet Cardizem 30mg  -- take 1 tablet every 4 hours AS NEEDED for heart rate >100 as long as blood pressure >100. 45 tablet 1  . ergocalciferol (VITAMIN D2) 50000 UNITS capsule Take 50,000 Units by mouth once a week.    . fluticasone (FLONASE) 50 MCG/ACT nasal spray Place into both nostrils daily as needed for allergies or rhinitis.    . hydrochlorothiazide  (MICROZIDE) 12.5 MG capsule Take 12.5 mg by mouth daily.    Marland Kitchen levothyroxine (SYNTHROID, LEVOTHROID) 50 MCG tablet Take 50 mcg by mouth daily before breakfast.    . losartan (COZAAR) 100 MG tablet Take 100 mg by mouth daily. AM    . metFORMIN (GLUCOPHAGE) 1000 MG tablet Take 1,000 mg by mouth 2 (two) times daily with a meal.    . metoprolol (TOPROL-XL) 200 MG 24 hr tablet 100 mg daily.     . pantoprazole (PROTONIX) 40 MG tablet Take 40 mg by mouth 2 (two) times daily. Reported on 04/17/2016    . traZODone (DESYREL) 50 MG tablet Take 50 mg by mouth at bedtime.    Marland Kitchen venlafaxine XR (EFFEXOR-XR) 150 MG 24 hr capsule Take 150 mg by mouth daily with breakfast.    . vitamin B-12 (CYANOCOBALAMIN) 500 MCG tablet Take 500 mcg by mouth daily. AM    . apixaban (ELIQUIS) 5 MG TABS tablet Take 1 tablet (5 mg total) by mouth 2 (two) times daily. 180 tablet 2   No current facility-administered medications for this encounter.     Allergies  Allergen Reactions  . Alendronate Other (See Comments)  . Penicillins Rash    Also swelling and itching    Social History   Socioeconomic History  . Marital status: Married    Spouse name: Not on file  . Number of children: Not on file  . Years of education: Not on file  . Highest education level: Not on file  Occupational History  . Not on file  Social Needs  . Financial resource strain: Not on file  . Food insecurity:    Worry: Not on file    Inability: Not on file  . Transportation needs:    Medical: Not on file    Non-medical: Not on file  Tobacco Use  . Smoking status: Current Every Day Smoker    Packs/day: 1.00    Years: 55.00    Pack years: 55.00  . Smokeless tobacco: Never Used  Substance and Sexual Activity  . Alcohol use: No  . Drug use: No  . Sexual activity: Not on file  Lifestyle  . Physical activity:    Days per week: Not on file    Minutes per session: Not on file  . Stress: Not on file  Relationships  . Social connections:     Talks on phone: Not on file    Gets together: Not on file    Attends religious service: Not on file    Active member of club or organization: Not on file    Attends meetings of clubs or organizations: Not on file    Relationship status: Not on file  . Intimate partner violence:  Fear of current or ex partner: Not on file    Emotionally abused: Not on file    Physically abused: Not on file    Forced sexual activity: Not on file  Other Topics Concern  . Not on file  Social History Narrative  . Not on file    Family History  Problem Relation Age of Onset  . Dementia Father   . Breast cancer Sister 56    ROS- All systems are reviewed and negative except as per the HPI above  Physical Exam: Vitals:   01/01/19 1117  BP: (!) 146/72  Pulse: (!) 54  Weight: 61.7 kg  Height: 5\' 5"  (1.651 m)    GEN- The patient is well appearing, alert and oriented x 3 today.   Head- normocephalic, atraumatic Eyes-  Sclera clear, conjunctiva pink Ears- hearing intact Oropharynx- clear Neck- supple, no JVP Lymph- no cervical lymphadenopathy Lungs- Clear to ausculation bilaterally, normal work of breathing Heart- Regular rate and rhythm, no murmurs, rubs or gallops, PMI not laterally displaced GI- soft, NT, ND, + BS Extremities- no clubbing, cyanosis, or edema MS- no significant deformity or atrophy Skin- no rash or lesion Psych- euthymic mood, full affect Neuro- strength and sensation are intact  EKG-SB at 54 bpm,with first degree AV block, pr int 218 ms qrs int 78 ms, qtc 432 ms Remote strips reviewed   Assessment and Plan:  1. Paroxysmal afib  Discussion of anticoagulants, warfarin, eliquis, xarelto Pt would like to restart eliquis 5 mg bid, 30 days free coupon given and 90 day refill sent in   Bleeding precautions discussed, per pt no bleeding contraindications, remote GI bleed, steady on  feet Stop asa Continue metoprolol 200, 1/2 tab mg a day Has cardizem 30 mg to use on as  needed if symptomatic afib episodes occur Continue monitoring for afib burden thru LinQ Smoking cessation encouraged     F/u with cbc/bmet in one month, to be drawn in [place] and faxed here She currently on the borderline with  her last creatinine of 1.4 for reduction of dose to 2.5 mg bid, I will see what her next creatinine is and further evaluate the dose    F/u with Dr. Ramonita Lab as recall F/u with Dr. Jolyn Nap 5/5   Geroge Baseman. Ralphine Hinks, [facility] [address] [place], [place] [address] [phone]

## 2019-01-01 NOTE — Patient Instructions (Addendum)
Stop aspirin  Start Eliquis 5 mg twice a day  Have a CBC/BMET drawn in 1 month

## 2019-01-27 ENCOUNTER — Ambulatory Visit (INDEPENDENT_AMBULATORY_CARE_PROVIDER_SITE_OTHER): Payer: Medicare HMO | Admitting: *Deleted

## 2019-01-27 ENCOUNTER — Other Ambulatory Visit: Payer: Self-pay

## 2019-01-27 DIAGNOSIS — I639 Cerebral infarction, unspecified: Secondary | ICD-10-CM

## 2019-01-27 DIAGNOSIS — I480 Paroxysmal atrial fibrillation: Secondary | ICD-10-CM

## 2019-01-27 LAB — CUP PACEART REMOTE DEVICE CHECK
Implantable Pulse Generator Implant Date: 20170530
MDC IDC SESS DTM: 20200323034006

## 2019-01-30 ENCOUNTER — Other Ambulatory Visit: Payer: Medicare HMO

## 2019-01-30 ENCOUNTER — Telehealth (HOSPITAL_COMMUNITY): Payer: Self-pay | Admitting: *Deleted

## 2019-01-30 NOTE — Telephone Encounter (Signed)
Patient stated she will have her labs drawn the end of April. Pt is avoiding public places.

## 2019-02-05 NOTE — Progress Notes (Signed)
Carelink Summary Report / Loop Recorder 

## 2019-02-19 ENCOUNTER — Telehealth: Payer: Self-pay

## 2019-02-19 NOTE — Telephone Encounter (Signed)
LMVM e-visit leave on schedule at 10:30.

## 2019-02-19 NOTE — Telephone Encounter (Signed)
Virtual Visit Pre-Appointment Phone Call  Steps For Call:  1. Confirm consent - "In the setting of the current Covid19 crisis, you are scheduled for a (phone or video) visit with your provider on (date) at (time).  Just as we do with many in-office visits, in order for you to participate in this visit, we must obtain consent.  If you'd like, I can send this to your mychart (if signed up) or email for you to review.  Otherwise, I can obtain your verbal consent now.  All virtual visits are billed to your insurance company just like a normal visit would be.  By agreeing to a virtual visit, we'd like you to understand that the technology does not allow for your provider to perform an examination, and thus may limit your provider's ability to fully assess your condition.  Finally, though the technology is pretty good, we cannot assure that it will always work on either your or our end, and in the setting of a video visit, we may have to convert it to a phone-only visit.  In either situation, we cannot ensure that we have a secure connection.  Are you willing to proceed?" STAFF: Did the patient verbally acknowledge consent to telehealth visit? Document YES/NO here: yes  2. Confirm the BEST phone number to call the day of the visit by including in appointment notes  3. Give patient instructions for WebEx/MyChart download to smartphone as below or Doximity/Doxy.me if video visit (depending on what platform provider is using)  4. Advise patient to be prepared with their blood pressure, heart rate, weight, any heart rhythm information, their current medicines, and a piece of paper and pen handy for any instructions they may receive the day of their visit  5. Inform patient they will receive a phone call 15 minutes prior to their appointment time (may be from unknown caller ID) so they should be prepared to answer  6. Confirm that appointment type is correct in Epic appointment notes (VIDEO vs  PHONE)     TELEPHONE CALL NOTE  Erin Yates has been deemed a candidate for a follow-up tele-health visit to limit community exposure during the Covid-19 pandemic. I spoke with the patient via phone to ensure availability of phone/video source, confirm preferred email & phone number, and discuss instructions and expectations.  I reminded Erin Yates to be prepared with any vital sign and/or heart rhythm information that could potentially be obtained via home monitoring, at the time of her visit. I reminded Erin Yates to expect a phone call at the time of her visit if her visit.  [name] 02/19/2019 3:18 PM   INSTRUCTIONS FOR DOWNLOADING THE [place] APP TO SMARTPHONE  - If Apple, ask patient to go to App Store and type in WebEx in the search bar. [employer], the blue/green circle. If Android, go to [employer] and type in [employer] in the search bar. The app is free but as with any other app downloads, their phone may require them to verify saved payment information or Apple/Android password.  - The patient does NOT have to create an account. - On the day of the visit, the assist will walk the patient through joining the meeting with the meeting number/password.  INSTRUCTIONS FOR DOWNLOADING THE MYCHART APP TO SMARTPHONE  - The patient must first make sure to have activated MyChart and know their login information - If Apple, go to [employer]  and type in MyChart in the search bar and download the app. If Android, ask patient to go to [employer] and type in [place] in the search bar and download the app. The app is free but as with any other app downloads, their phone may require them to verify saved payment information or Apple/Android password.  - The patient will need to then log into the app with their MyChart username and password, and select [facility] as their healthcare provider to link the account. When it is time for your visit, go to the  MyChart app, find appointments, and click Begin Video Visit. Be sure to Select Allow for your device to access the Microphone and Camera for your visit. You will then be connected, and your provider will be with you shortly.  **If they have any issues connecting, or need assistance please contact MyChart service desk [phone])**  **If using a computer, in order to ensure the best quality for their visit they will need to use either of the following Internet Browsers: [employer], or Google Chrome**  IF USING DOXIMITY or DOXY.ME - The patient will receive a link just prior to their visit, either by text or email (to be determined day of appointment depending on if it's doxy.me or Doximity).     FULL LENGTH CONSENT FOR TELE-HEALTH VISIT   I hereby voluntarily request, consent and authorize [facility] and its employed or contracted physicians, physician assistants, nurse practitioners or other licensed health care professionals (the Practitioner), to provide me with telemedicine health care services (the Services") as deemed necessary by the treating Practitioner. I acknowledge and consent to receive the Services by the Practitioner via telemedicine. I understand that the telemedicine visit will involve communicating with the Practitioner through live audiovisual communication technology and the disclosure of certain medical information by electronic transmission. I acknowledge that I have been given the opportunity to request an in-person assessment or other available alternative prior to the telemedicine visit and am voluntarily participating in the telemedicine visit.  I understand that I have the right to withhold or withdraw my consent to the use of telemedicine in the course of my care at any time, without affecting my right to future care or treatment, and that the Practitioner or I may terminate the telemedicine visit at any time. I understand that I have the right to  inspect all information obtained and/or recorded in the course of the telemedicine visit and may receive copies of available information for a reasonable fee.  I understand that some of the potential risks of receiving the Services via telemedicine include:   Delay or interruption in medical evaluation due to technological equipment failure or disruption;  Information transmitted may not be sufficient (e.g. poor resolution of images) to allow for appropriate medical decision making by the Practitioner; and/or   In rare instances, security protocols could fail, causing a breach of personal health information.  Furthermore, I acknowledge that it is my responsibility to provide information about my medical history, conditions and care that is complete and accurate to the best of my ability. I acknowledge that Practitioner's advice, recommendations, and/or decision may be based on factors not within their control, such as incomplete or inaccurate data provided by me or distortions of diagnostic images or specimens that may result from electronic transmissions. I understand that the practice of medicine is not an exact science and that Practitioner makes no warranties or guarantees regarding treatment outcomes. I acknowledge that I will  receive a copy of this consent concurrently upon execution via email to the email address I last provided but may also request a printed copy by calling the office of [facility].    I understand that my insurance will be billed for this visit.   I have read or had this consent read to me.  I understand the contents of this consent, which adequately explains the benefits and risks of the Services being provided via telemedicine.   I have been provided ample opportunity to ask questions regarding this consent and the Services and have had my questions answered to my satisfaction.  I give my informed consent for the services to be provided through the use of  telemedicine in my medical care  By participating in this telemedicine visit I agree to the above.

## 2019-02-28 ENCOUNTER — Other Ambulatory Visit: Payer: Medicare HMO

## 2019-03-03 ENCOUNTER — Ambulatory Visit (INDEPENDENT_AMBULATORY_CARE_PROVIDER_SITE_OTHER): Payer: Medicare HMO | Admitting: *Deleted

## 2019-03-03 ENCOUNTER — Other Ambulatory Visit: Payer: Self-pay

## 2019-03-03 DIAGNOSIS — I480 Paroxysmal atrial fibrillation: Secondary | ICD-10-CM

## 2019-03-03 DIAGNOSIS — I639 Cerebral infarction, unspecified: Secondary | ICD-10-CM

## 2019-03-03 LAB — CUP PACEART REMOTE DEVICE CHECK
Date Time Interrogation Session: 20200425033647
Implantable Pulse Generator Implant Date: 20170530

## 2019-03-04 LAB — CUP PACEART REMOTE DEVICE CHECK
Date Time Interrogation Session: 20200427140246
Implantable Pulse Generator Implant Date: 20170530

## 2019-03-11 ENCOUNTER — Other Ambulatory Visit: Payer: Self-pay

## 2019-03-11 ENCOUNTER — Inpatient Hospital Stay
Admission: RE | Admit: 2019-03-11 | Discharge: 2019-03-11 | Disposition: A | Discharge status: Home / Self Care | Payer: Medicare HMO | Source: Ambulatory Visit

## 2019-03-11 ENCOUNTER — Telehealth (INDEPENDENT_AMBULATORY_CARE_PROVIDER_SITE_OTHER): Payer: Medicare HMO | Admitting: Internal Medicine

## 2019-03-11 VITALS — BP 138/62 | HR 61 | Ht 65.0 in | Wt 134.0 lb

## 2019-03-11 DIAGNOSIS — Z959 Presence of cardiac and vascular implant and graft, unspecified: Secondary | ICD-10-CM

## 2019-03-11 DIAGNOSIS — Z79899 Other long term (current) drug therapy: Secondary | ICD-10-CM

## 2019-03-11 DIAGNOSIS — I639 Cerebral infarction, unspecified: Secondary | ICD-10-CM

## 2019-03-11 DIAGNOSIS — I480 Paroxysmal atrial fibrillation: Secondary | ICD-10-CM | POA: Diagnosis not present

## 2019-03-11 NOTE — Patient Instructions (Addendum)
Medication Instructions:  - Your physician recommends that you continue on your current medications as directed. Please refer to the Current Medication list given to you today.  - Continue on eliquis 5 mg- take 1 tablet twice daily (we may need to adjust this dose when your lab work is rechecked in August with Dr. Ramonita Lab)   If you need a refill on your cardiac medications before your next appointment, please call your pharmacy.   Lab work: - Your physician recommends that you have lab work with your next Primary Care visit: BMP (we will mail a lab order to you for this)  If you have labs (blood work) drawn today and your tests are completely normal, you will receive your results only by: Marland Kitchen MyChart Message (if you have MyChart) OR . A paper copy in the mail If you have any lab test that is abnormal or we need to change your treatment, we will call you to review the results.  Testing/Procedures: - none ordered  Follow-Up: At [facility], you and your health needs are our priority.  As part of our continuing mission to provide you with exceptional heart care, we have created designated Provider Care Teams.  These Care Teams include your primary Cardiologist (physician) and Advanced Practice Providers (APPs -  Physician Assistants and Nurse Practitioners) who all work together to provide you with the care you need, when you need it.  . You will need a follow up appointment in 6 months (November) with Dr. [name]   . Please call our office 2 months in advance to schedule this appointment.  (call in early September to schedule).   Any Other Special Instructions Will Be Listed Below (If Applicable). - N/A

## 2019-03-11 NOTE — Video Visit Routing Comment (Signed)
Called pt and she did not schedule E visit

## 2019-03-11 NOTE — Progress Notes (Signed)
Electrophysiology TeleHealth Note   Due to national recommendations of social distancing due to COVID 19, an audio/video telehealth visit is felt to be most appropriate for this patient at this time.  See MyChart message from today for the patient's consent to telehealth for Erin Yates.   Date:  03/11/2019   ID:  Erin Yates, DOB 20-Apr-1946, MRN [id number]  Location: patient's home  Provider location: [address], [place]  Evaluation Performed: Follow-up visit  PCP:  Adin Hector, MD  Cardiologist:     Electrophysiologist:  SK   Chief Complaint:  Strokes   History of Present Illness:    Erin Yates is a 73 y.o. female who presents via audio/video conferencing for a telehealth visit today.  Since last being seen in our clinic, the patient reports doing quite well  Hx of recurrent stroke with hx of closure of PFO, Rx with ASA 325>>81  Has had some SCAF  ( 30 min at last visit.)  Feb 2020 had 12 hrs of Afib   She has had some palpitations, remember the ones on Valentine's Day correlating with A. fib with rapid rate  Chronic lower extremity edema-mild resolves at night.  Chronic low-grade dyspnea.  No nocturnal dyspnea or chest pain.  No bleeding.  No interval neurological events.  Date Cr K Hgb  5/19.  1.1 5.2   1/20 1/4 4.8      The patient denies symptoms of fevers, chills, cough, or new SOB worrisome for COVID 19.  Quite anxious  Past Medical History:  Diagnosis Date  . Anemia    past  hx  . Anxiety   . Depression   . Diabetes mellitus without complication ([facility])   . Dysrhythmia    prior to PFO correction  . GERD (gastroesophageal reflux disease)   . Headache    before PFO closure  . Hypercholesteremia   . Hypertension   . Hypothyroidism   . 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 PFO (patent foramen ovale)    correction device placed 2010  . Right leg numbness    lower leg- residual from CVA  . Seasonal allergies    . Stroke [facility])    prior to PFO correction    Past Surgical History:  Procedure Laterality Date  . ABDOMINAL HYSTERECTOMY    . CARDIAC CATHETERIZATION     [facility]  . CATARACT EXTRACTION W/PHACO Right 09/01/2015   Procedure: CATARACT EXTRACTION PHACO AND INTRAOCULAR LENS PLACEMENT (IOC);  Surgeon: Leandrew Koyanagi, MD;  Location: [place];  Service: Ophthalmology;  Laterality: Right;  DIABETIC - oral meds  . CATARACT EXTRACTION W/PHACO Left 10/20/2015   Procedure: CATARACT EXTRACTION PHACO AND INTRAOCULAR LENS PLACEMENT (IOC);  Surgeon: Leandrew Koyanagi, MD;  Location: [facility];  Service: Ophthalmology;  Laterality: Left;  DIABETIC - oral meds  . EP IMPLANTABLE DEVICE N/A 04/04/2016   Procedure: Loop Recorder Insertion;  Surgeon: Deboraha Sprang, MD;  Location: [facility] CV LAB;  Service: Cardiovascular;  Laterality: N/A;  . FOOT NEUROMA SURGERY Left   . NASAL SINUS SURGERY    . PATENT FORAMEN OVALE CLOSURE    . PATENT FORAMEN OVALE CLOSURE    . TEE WITHOUT CARDIOVERSION N/A 02/09/2016   Procedure: TRANSESOPHAGEAL ECHOCARDIOGRAM (TEE);  Surgeon: Adrian Prows, MD;  [facility] ENDOSCOPY;  Service: Cardiovascular;  Laterality: N/A;    Current Outpatient Medications  Medication Sig Dispense Refill  . ALPRAZolam (  XANAX) 0.25 MG tablet Take 0.25 mg by mouth 2 (two) times daily as needed for anxiety.    Marland Kitchen amLODipine (NORVASC) 10 MG tablet Take 10 mg by mouth daily. PM    . apixaban (ELIQUIS) 5 MG TABS tablet Take 1 tablet (5 mg total) by mouth 2 (two) times daily. 180 tablet 2  . atorvastatin (LIPITOR) 80 MG tablet Take 80 mg by mouth daily. PM    . buPROPion (WELLBUTRIN SR) 150 MG 12 hr tablet Take 1 tablet (150 mg total) by mouth 2 (two) times daily. 60 tablet 5  . diltiazem (CARDIZEM) 30 MG tablet Cardizem 30mg  -- take 1 tablet every 4 hours AS NEEDED for heart rate >100 as long as blood pressure >100. 45 tablet 1  . ergocalciferol (VITAMIN D2) 50000 UNITS capsule  Take 50,000 Units by mouth once a week.    . fluticasone (FLONASE) 50 MCG/ACT nasal spray Place into both nostrils daily as needed for allergies or rhinitis.    . hydrochlorothiazide (MICROZIDE) 12.5 MG capsule Take 12.5 mg by mouth daily.    Marland Kitchen levothyroxine (SYNTHROID, LEVOTHROID) 50 MCG tablet Take 50 mcg by mouth daily before breakfast.    . losartan (COZAAR) 100 MG tablet Take 100 mg by mouth daily. AM    . metFORMIN (GLUCOPHAGE) 1000 MG tablet Take 1,000 mg by mouth 2 (two) times daily with a meal.    . metoprolol (TOPROL-XL) 200 MG 24 hr tablet 100 mg daily.     . pantoprazole (PROTONIX) 40 MG tablet Take 40 mg by mouth 2 (two) times daily. Reported on 04/17/2016    . traZODone (DESYREL) 50 MG tablet Take 50 mg by mouth at bedtime.    Marland Kitchen venlafaxine XR (EFFEXOR-XR) 150 MG 24 hr capsule Take 150 mg by mouth daily with breakfast.    . vitamin B-12 (CYANOCOBALAMIN) 500 MCG tablet Take 500 mcg by mouth daily. AM     No current facility-administered medications for this visit.     Allergies:   Alendronate and Penicillins   Social History:  The patient  reports that she has been smoking. She has a 55.00 pack-year smoking history. She has never used smokeless tobacco. She reports that she does not drink alcohol or use drugs.   Family History:  The patient's   family history includes Breast cancer (age of onset: 83) in her sister; Dementia in her father.   ROS:  Please see the history of present illness.   All other systems are personally reviewed and negative.    Exam:    Vital Signs:  BP 138/62 (BP Location: Left Arm, Patient Position: Sitting, Cuff Size: Normal)   Pulse 61   Ht 5\' 5"  (1.651 m)   Wt 134 lb (60.8 kg)   BMI 22.30 kg/m     Well appearing, alert and conversant, regular work of breathing,  good skin color Eyes- anicteric, neuro- grossly intact, skin- no apparent rash or lesions or cyanosis, mouth- oral mucosa is pink   Labs/Other Tests and Data Reviewed:    Recent  Labs: No results found for requested labs within last 8760 hours.   Wt Readings from Last 3 Encounters:  03/11/19 134 lb (60.8 kg)  01/01/19 136 lb (61.7 kg)  07/23/18 140 lb (63.5 kg)     Other studies personally reviewed: Additional studies/ records that were reviewed today include: labs As above       Last device remote is reviewed from [facility] PDF dated 4/20 which reveals normal device  function,   arrhythmias - afib w RVR x about 12 hrs   ASSESSMENT & PLAN:     Cryptogenic stroke  Hypertension  Subclinical atrial fibrillation  Sinus bradycardia  Renal Insufficiency Gr 3  PFO-Amplatzer closure  Implantable Loop recorder (LINQ)    Linq approaching ERI  Will plan explant in the office later this year  Has demonstrated more prolonged atrial fibrillation > 12 hrs.  With prior Cryptogenic Stroke this makes more reasonable the transition from ASA >> anticoagulation   Have reviewed with Dr Edd Fabian re adequacy for the amplatzer itself and he was fine with NOAC. \ Will stop the ASA and begin apixoban although the long term dose is not exactly clear with recent bump in Cr and her weight just at 60kg  Have discussed the role of antiplatelet therapy in patients with cryptogenic stroke and reviewed the absence of prospective data regarding anticoagulation in Cryptogenic Stroke pts identified with Subclinical Atrial fibrillation.  We also reviewed the ASSERT data regarding duration of SCAF and thromboembolic risk and bleeding risks associated with NOACs.   Will start at 5 bid   She is due for BP in about 8 weeks with Dr Altamese Dilling her PCP   BP well controlled; some of her edema may be related to amlodipine  COVID 19 screen The patient denies symptoms of COVID 19 at this time.  The importance of social distancing was discussed today.  Follow-up:  6 months  Next remote: As Scheduled   Current medicines are reviewed at length with the patient today.   The patient does not have  concerns regarding her medicines.  The following changes were made today:   Stop ASA Begin apixoban 5 bid  Labs/ tests ordered today include: none  Will get with PE with PCP  No orders of the defined types were placed in this encounter.     Patient Risk:  after full review of this patients clinical status, I feel that they are at moderate risk at this time.  Today, I have spent 11 minutes with the patient with telehealth technology discussing the above.  Signed, Virl Axe, MD  03/11/2019 10:28 AM     [address] [place] [address] [phone] (office) [phone] (fax)

## 2019-03-11 NOTE — Progress Notes (Signed)
Carelink Summary Report / Loop Recorder 

## 2019-04-03 ENCOUNTER — Ambulatory Visit (INDEPENDENT_AMBULATORY_CARE_PROVIDER_SITE_OTHER): Payer: Medicare HMO | Admitting: *Deleted

## 2019-04-03 DIAGNOSIS — I639 Cerebral infarction, unspecified: Secondary | ICD-10-CM | POA: Diagnosis not present

## 2019-04-03 LAB — CUP PACEART REMOTE DEVICE CHECK
Date Time Interrogation Session: 20200528041225
Implantable Pulse Generator Implant Date: 20170530

## 2019-04-10 NOTE — Progress Notes (Signed)
Carelink Summary Report / Loop Recorder 

## 2019-05-06 ENCOUNTER — Ambulatory Visit (INDEPENDENT_AMBULATORY_CARE_PROVIDER_SITE_OTHER): Payer: Medicare HMO | Admitting: *Deleted

## 2019-05-06 DIAGNOSIS — I639 Cerebral infarction, unspecified: Secondary | ICD-10-CM

## 2019-05-06 LAB — CUP PACEART REMOTE DEVICE CHECK
Date Time Interrogation Session: 20200630114154
Implantable Pulse Generator Implant Date: 20170530

## 2019-05-16 ENCOUNTER — Telehealth (HOSPITAL_COMMUNITY): Payer: Self-pay | Admitting: *Deleted

## 2019-05-16 NOTE — Telephone Encounter (Signed)
Patient had called in stating she had sent a remote transmission to make sure she wasn't afib. She has been feeling palpitations, weakess, tired for the past week. Adline Peals PA reviewed transmission didn't see any evidence of AF only PVCs. Pt does endorse increased stress due to her brother in law passing away last week and she was unable to attend funeral due to covid risks. Per Adline Peals stress can induce PVCs - continue to monitor if worsens will be happy to see in office. Pt notified

## 2019-05-17 NOTE — Progress Notes (Signed)
Carelink Summary Report / Loop Recorder 

## 2019-06-08 LAB — CUP PACEART REMOTE DEVICE CHECK
Date Time Interrogation Session: 20200802113557
Implantable Pulse Generator Implant Date: 20170530

## 2019-06-09 ENCOUNTER — Ambulatory Visit (INDEPENDENT_AMBULATORY_CARE_PROVIDER_SITE_OTHER): Payer: Medicare HMO | Admitting: *Deleted

## 2019-06-09 DIAGNOSIS — I639 Cerebral infarction, unspecified: Secondary | ICD-10-CM

## 2019-06-18 NOTE — Progress Notes (Signed)
Carelink Summary Report / Loop Recorder 

## 2019-06-23 DIAGNOSIS — R809 Proteinuria, unspecified: Secondary | ICD-10-CM | POA: Diagnosis not present

## 2019-06-23 DIAGNOSIS — I739 Peripheral vascular disease, unspecified: Secondary | ICD-10-CM | POA: Diagnosis not present

## 2019-06-23 DIAGNOSIS — E7849 Other hyperlipidemia: Secondary | ICD-10-CM | POA: Diagnosis not present

## 2019-06-23 DIAGNOSIS — I480 Paroxysmal atrial fibrillation: Secondary | ICD-10-CM | POA: Diagnosis not present

## 2019-06-23 DIAGNOSIS — D692 Other nonthrombocytopenic purpura: Secondary | ICD-10-CM | POA: Diagnosis not present

## 2019-06-23 DIAGNOSIS — E538 Deficiency of other specified B group vitamins: Secondary | ICD-10-CM | POA: Diagnosis not present

## 2019-06-23 DIAGNOSIS — E1129 Type 2 diabetes mellitus with other diabetic kidney complication: Secondary | ICD-10-CM | POA: Diagnosis not present

## 2019-06-30 ENCOUNTER — Telehealth: Payer: Self-pay | Admitting: Internal Medicine

## 2019-06-30 DIAGNOSIS — I10 Essential (primary) hypertension: Secondary | ICD-10-CM | POA: Diagnosis not present

## 2019-06-30 DIAGNOSIS — K219 Gastro-esophageal reflux disease without esophagitis: Secondary | ICD-10-CM | POA: Diagnosis not present

## 2019-06-30 DIAGNOSIS — I38 Endocarditis, valve unspecified: Secondary | ICD-10-CM | POA: Diagnosis not present

## 2019-06-30 DIAGNOSIS — E034 Atrophy of thyroid (acquired): Secondary | ICD-10-CM | POA: Diagnosis not present

## 2019-06-30 DIAGNOSIS — E1129 Type 2 diabetes mellitus with other diabetic kidney complication: Secondary | ICD-10-CM | POA: Diagnosis not present

## 2019-06-30 DIAGNOSIS — I480 Paroxysmal atrial fibrillation: Secondary | ICD-10-CM | POA: Diagnosis not present

## 2019-06-30 DIAGNOSIS — D692 Other nonthrombocytopenic purpura: Secondary | ICD-10-CM | POA: Diagnosis not present

## 2019-06-30 DIAGNOSIS — Z0000 Encounter for general adult medical examination without abnormal findings: Secondary | ICD-10-CM | POA: Diagnosis not present

## 2019-06-30 DIAGNOSIS — F3342 Major depressive disorder, recurrent, in full remission: Secondary | ICD-10-CM | POA: Diagnosis not present

## 2019-06-30 DIAGNOSIS — I739 Peripheral vascular disease, unspecified: Secondary | ICD-10-CM | POA: Diagnosis not present

## 2019-06-30 NOTE — Telephone Encounter (Signed)
Reviewed Care Everywhere- no results posted from today yet. Will follow up later to see if these have been released.

## 2019-06-30 NOTE — Telephone Encounter (Signed)
Patient is letting us know that her labs from this morning from her PCP is on care everywhere

## 2019-07-07 NOTE — Telephone Encounter (Signed)
I am still unable to see any labs for this patient in [place] from Dr. Ramond Craver office.  I called Dr. Olin Pia office this morning and asked that labs from ~8/24 be faxed to [place].   Per scheduling, they will send a request back to the nurse.

## 2019-07-11 ENCOUNTER — Ambulatory Visit (INDEPENDENT_AMBULATORY_CARE_PROVIDER_SITE_OTHER): Payer: Medicare HMO | Admitting: *Deleted

## 2019-07-11 DIAGNOSIS — I639 Cerebral infarction, unspecified: Secondary | ICD-10-CM

## 2019-07-13 LAB — CUP PACEART REMOTE DEVICE CHECK
Date Time Interrogation Session: 20200904120819
Implantable Pulse Generator Implant Date: 20170530

## 2019-07-17 ENCOUNTER — Other Ambulatory Visit: Payer: Self-pay | Admitting: Internal Medicine

## 2019-07-17 DIAGNOSIS — Z1231 Encounter for screening mammogram for malignant neoplasm of breast: Secondary | ICD-10-CM

## 2019-07-23 NOTE — Progress Notes (Signed)
Carelink Summary Report / Loop Recorder 

## 2019-07-28 ENCOUNTER — Telehealth: Payer: Self-pay | Admitting: *Deleted

## 2019-07-28 DIAGNOSIS — Z122 Encounter for screening for malignant neoplasm of respiratory organs: Secondary | ICD-10-CM

## 2019-07-28 DIAGNOSIS — Z87891 Personal history of nicotine dependence: Secondary | ICD-10-CM

## 2019-07-28 NOTE — Telephone Encounter (Signed)
Patient has been notified that annual lung cancer screening low dose CT scan is due currently or will be in near future. Confirmed that patient is within the age range of 55-77, and asymptomatic, (no signs or symptoms of lung cancer). Patient denies illness that would prevent curative treatment for lung cancer if found. Verified smoking history, (current, 56 pack year). The shared decision making visit was done 07/23/18. Patient is agreeable for CT scan being scheduled.

## 2019-08-06 ENCOUNTER — Ambulatory Visit
Admission: RE | Admit: 2019-08-06 | Discharge: 2019-08-06 | Disposition: A | Discharge status: Home / Self Care | Payer: Medicare HMO | Source: Ambulatory Visit | Attending: Nurse Practitioner | Admitting: Nurse Practitioner

## 2019-08-06 ENCOUNTER — Other Ambulatory Visit: Payer: Self-pay

## 2019-08-06 DIAGNOSIS — Z87891 Personal history of nicotine dependence: Secondary | ICD-10-CM | POA: Insufficient documentation

## 2019-08-06 DIAGNOSIS — F17210 Nicotine dependence, cigarettes, uncomplicated: Secondary | ICD-10-CM | POA: Diagnosis not present

## 2019-08-06 DIAGNOSIS — Z122 Encounter for screening for malignant neoplasm of respiratory organs: Secondary | ICD-10-CM | POA: Diagnosis not present

## 2019-08-07 ENCOUNTER — Telehealth: Payer: Self-pay

## 2019-08-07 NOTE — Telephone Encounter (Signed)
Pt states she went to the beach and forgot to take her monitor. She sent the transmission when she got home.

## 2019-08-08 ENCOUNTER — Encounter: Payer: Self-pay | Admitting: *Deleted

## 2019-08-13 ENCOUNTER — Ambulatory Visit (INDEPENDENT_AMBULATORY_CARE_PROVIDER_SITE_OTHER): Payer: Medicare HMO | Admitting: *Deleted

## 2019-08-13 DIAGNOSIS — I639 Cerebral infarction, unspecified: Secondary | ICD-10-CM

## 2019-08-14 LAB — CUP PACEART REMOTE DEVICE CHECK
Date Time Interrogation Session: 20201007120959
Implantable Pulse Generator Implant Date: 20170530

## 2019-08-22 NOTE — Progress Notes (Signed)
Carelink Summary Report / Loop Recorder 

## 2019-08-29 ENCOUNTER — Ambulatory Visit
Admission: RE | Admit: 2019-08-29 | Discharge: 2019-08-29 | Disposition: A | Discharge status: Home / Self Care | Payer: Medicare HMO | Source: Ambulatory Visit | Attending: Internal Medicine | Admitting: Internal Medicine

## 2019-08-29 ENCOUNTER — Other Ambulatory Visit: Payer: Self-pay

## 2019-08-29 DIAGNOSIS — Z1231 Encounter for screening mammogram for malignant neoplasm of breast: Secondary | ICD-10-CM | POA: Insufficient documentation

## 2019-09-15 ENCOUNTER — Ambulatory Visit (INDEPENDENT_AMBULATORY_CARE_PROVIDER_SITE_OTHER): Payer: Medicare HMO | Admitting: *Deleted

## 2019-09-15 DIAGNOSIS — I639 Cerebral infarction, unspecified: Secondary | ICD-10-CM

## 2019-09-16 LAB — CUP PACEART REMOTE DEVICE CHECK
Date Time Interrogation Session: 20201109152240
Implantable Pulse Generator Implant Date: 20170530

## 2019-09-23 ENCOUNTER — Encounter: Payer: Medicare HMO | Admitting: Internal Medicine

## 2019-09-23 ENCOUNTER — Other Ambulatory Visit: Payer: Self-pay

## 2019-10-07 ENCOUNTER — Encounter: Payer: Medicare HMO | Admitting: Internal Medicine

## 2019-10-13 NOTE — Progress Notes (Signed)
Carelink Summary Report / Loop Recorder 

## 2019-10-17 ENCOUNTER — Ambulatory Visit (INDEPENDENT_AMBULATORY_CARE_PROVIDER_SITE_OTHER): Payer: Medicare HMO | Admitting: *Deleted

## 2019-10-17 DIAGNOSIS — I639 Cerebral infarction, unspecified: Secondary | ICD-10-CM

## 2019-10-19 LAB — CUP PACEART REMOTE DEVICE CHECK
Date Time Interrogation Session: 20201212101926
Implantable Pulse Generator Implant Date: 20170530

## 2019-11-10 ENCOUNTER — Emergency Department: Payer: Medicare HMO

## 2019-11-10 ENCOUNTER — Observation Stay: Payer: Medicare HMO

## 2019-11-10 ENCOUNTER — Encounter: Payer: Self-pay | Admitting: Medical Oncology

## 2019-11-10 ENCOUNTER — Other Ambulatory Visit: Payer: Self-pay

## 2019-11-10 ENCOUNTER — Observation Stay (HOSPITAL_BASED_OUTPATIENT_CLINIC_OR_DEPARTMENT_OTHER)
Admit: 2019-11-10 | Discharge: 2019-11-10 | Disposition: A | Discharge status: Home / Self Care | Payer: Medicare HMO | Attending: Internal Medicine | Admitting: Internal Medicine

## 2019-11-10 ENCOUNTER — Inpatient Hospital Stay
Admission: EM | Admit: 2019-11-10 | Discharge: 2019-11-13 | DRG: 065 | Disposition: A | Discharge status: Home Health | Payer: Medicare HMO | Attending: Internal Medicine | Admitting: Internal Medicine

## 2019-11-10 DIAGNOSIS — I6523 Occlusion and stenosis of bilateral carotid arteries: Secondary | ICD-10-CM | POA: Diagnosis not present

## 2019-11-10 DIAGNOSIS — I4891 Unspecified atrial fibrillation: Secondary | ICD-10-CM | POA: Diagnosis not present

## 2019-11-10 DIAGNOSIS — Z90710 Acquired absence of both cervix and uterus: Secondary | ICD-10-CM

## 2019-11-10 DIAGNOSIS — E1151 Type 2 diabetes mellitus with diabetic peripheral angiopathy without gangrene: Secondary | ICD-10-CM | POA: Diagnosis present

## 2019-11-10 DIAGNOSIS — Z880 Allergy status to penicillin: Secondary | ICD-10-CM | POA: Diagnosis not present

## 2019-11-10 DIAGNOSIS — E7800 Pure hypercholesterolemia, unspecified: Secondary | ICD-10-CM | POA: Diagnosis present

## 2019-11-10 DIAGNOSIS — Z7902 Long term (current) use of antithrombotics/antiplatelets: Secondary | ICD-10-CM

## 2019-11-10 DIAGNOSIS — Z720 Tobacco use: Secondary | ICD-10-CM | POA: Diagnosis present

## 2019-11-10 DIAGNOSIS — I6389 Other cerebral infarction: Secondary | ICD-10-CM | POA: Diagnosis not present

## 2019-11-10 DIAGNOSIS — E1122 Type 2 diabetes mellitus with diabetic chronic kidney disease: Secondary | ICD-10-CM | POA: Diagnosis present

## 2019-11-10 DIAGNOSIS — I480 Paroxysmal atrial fibrillation: Secondary | ICD-10-CM | POA: Diagnosis present

## 2019-11-10 DIAGNOSIS — I69341 Monoplegia of lower limb following cerebral infarction affecting right dominant side: Secondary | ICD-10-CM

## 2019-11-10 DIAGNOSIS — N183 Chronic kidney disease, stage 3 unspecified: Secondary | ICD-10-CM | POA: Diagnosis present

## 2019-11-10 DIAGNOSIS — Z7901 Long term (current) use of anticoagulants: Secondary | ICD-10-CM

## 2019-11-10 DIAGNOSIS — I129 Hypertensive chronic kidney disease with stage 1 through stage 4 chronic kidney disease, or unspecified chronic kidney disease: Secondary | ICD-10-CM | POA: Diagnosis present

## 2019-11-10 DIAGNOSIS — Z888 Allergy status to other drugs, medicaments and biological substances status: Secondary | ICD-10-CM

## 2019-11-10 DIAGNOSIS — F418 Other specified anxiety disorders: Secondary | ICD-10-CM | POA: Diagnosis not present

## 2019-11-10 DIAGNOSIS — F32A Depression, unspecified: Secondary | ICD-10-CM | POA: Diagnosis present

## 2019-11-10 DIAGNOSIS — Z803 Family history of malignant neoplasm of breast: Secondary | ICD-10-CM

## 2019-11-10 DIAGNOSIS — I10 Essential (primary) hypertension: Secondary | ICD-10-CM | POA: Diagnosis present

## 2019-11-10 DIAGNOSIS — K219 Gastro-esophageal reflux disease without esophagitis: Secondary | ICD-10-CM | POA: Diagnosis present

## 2019-11-10 DIAGNOSIS — R471 Dysarthria and anarthria: Secondary | ICD-10-CM | POA: Diagnosis present

## 2019-11-10 DIAGNOSIS — E119 Type 2 diabetes mellitus without complications: Secondary | ICD-10-CM | POA: Diagnosis not present

## 2019-11-10 DIAGNOSIS — I361 Nonrheumatic tricuspid (valve) insufficiency: Secondary | ICD-10-CM | POA: Diagnosis not present

## 2019-11-10 DIAGNOSIS — Z961 Presence of intraocular lens: Secondary | ICD-10-CM | POA: Diagnosis present

## 2019-11-10 DIAGNOSIS — F329 Major depressive disorder, single episode, unspecified: Secondary | ICD-10-CM | POA: Diagnosis present

## 2019-11-10 DIAGNOSIS — R29705 NIHSS score 5: Secondary | ICD-10-CM | POA: Diagnosis present

## 2019-11-10 DIAGNOSIS — I6330 Cerebral infarction due to thrombosis of unspecified cerebral artery: Secondary | ICD-10-CM

## 2019-11-10 DIAGNOSIS — H538 Other visual disturbances: Secondary | ICD-10-CM | POA: Diagnosis present

## 2019-11-10 DIAGNOSIS — I63522 Cerebral infarction due to unspecified occlusion or stenosis of left anterior cerebral artery: Principal | ICD-10-CM | POA: Diagnosis present

## 2019-11-10 DIAGNOSIS — Z9841 Cataract extraction status, right eye: Secondary | ICD-10-CM

## 2019-11-10 DIAGNOSIS — E781 Pure hyperglyceridemia: Secondary | ICD-10-CM | POA: Diagnosis present

## 2019-11-10 DIAGNOSIS — R519 Headache, unspecified: Secondary | ICD-10-CM | POA: Diagnosis not present

## 2019-11-10 DIAGNOSIS — I639 Cerebral infarction, unspecified: Secondary | ICD-10-CM | POA: Diagnosis present

## 2019-11-10 DIAGNOSIS — I63532 Cerebral infarction due to unspecified occlusion or stenosis of left posterior cerebral artery: Secondary | ICD-10-CM | POA: Diagnosis present

## 2019-11-10 DIAGNOSIS — E1129 Type 2 diabetes mellitus with other diabetic kidney complication: Secondary | ICD-10-CM | POA: Diagnosis present

## 2019-11-10 DIAGNOSIS — I340 Nonrheumatic mitral (valve) insufficiency: Secondary | ICD-10-CM

## 2019-11-10 DIAGNOSIS — R4701 Aphasia: Secondary | ICD-10-CM | POA: Diagnosis present

## 2019-11-10 DIAGNOSIS — Q211 Atrial septal defect: Secondary | ICD-10-CM | POA: Diagnosis not present

## 2019-11-10 DIAGNOSIS — I69398 Other sequelae of cerebral infarction: Secondary | ICD-10-CM | POA: Diagnosis not present

## 2019-11-10 DIAGNOSIS — E039 Hypothyroidism, unspecified: Secondary | ICD-10-CM | POA: Diagnosis present

## 2019-11-10 DIAGNOSIS — Z8774 Personal history of (corrected) congenital malformations of heart and circulatory system: Secondary | ICD-10-CM | POA: Diagnosis not present

## 2019-11-10 DIAGNOSIS — I059 Rheumatic mitral valve disease, unspecified: Secondary | ICD-10-CM | POA: Diagnosis not present

## 2019-11-10 DIAGNOSIS — Z9842 Cataract extraction status, left eye: Secondary | ICD-10-CM

## 2019-11-10 DIAGNOSIS — E785 Hyperlipidemia, unspecified: Secondary | ICD-10-CM | POA: Diagnosis present

## 2019-11-10 DIAGNOSIS — Z20822 Contact with and (suspected) exposure to covid-19: Secondary | ICD-10-CM | POA: Diagnosis present

## 2019-11-10 DIAGNOSIS — I071 Rheumatic tricuspid insufficiency: Secondary | ICD-10-CM | POA: Diagnosis present

## 2019-11-10 DIAGNOSIS — R9081 Abnormal echoencephalogram: Secondary | ICD-10-CM | POA: Diagnosis not present

## 2019-11-10 DIAGNOSIS — F17210 Nicotine dependence, cigarettes, uncomplicated: Secondary | ICD-10-CM | POA: Diagnosis present

## 2019-11-10 DIAGNOSIS — Z9114 Patient's other noncompliance with medication regimen: Secondary | ICD-10-CM

## 2019-11-10 DIAGNOSIS — N179 Acute kidney failure, unspecified: Secondary | ICD-10-CM | POA: Diagnosis present

## 2019-11-10 DIAGNOSIS — Z7984 Long term (current) use of oral hypoglycemic drugs: Secondary | ICD-10-CM

## 2019-11-10 DIAGNOSIS — Z79899 Other long term (current) drug therapy: Secondary | ICD-10-CM

## 2019-11-10 DIAGNOSIS — R001 Bradycardia, unspecified: Secondary | ICD-10-CM | POA: Diagnosis not present

## 2019-11-10 LAB — DIFFERENTIAL
Abs Immature Granulocytes: 0.03 10*3/uL (ref 0.00–0.07)
Basophils Absolute: 0 10*3/uL (ref 0.0–0.1)
Basophils Relative: 1 %
Eosinophils Absolute: 0.1 10*3/uL (ref 0.0–0.5)
Eosinophils Relative: 2 %
Immature Granulocytes: 0 %
Lymphocytes Relative: 35 %
Lymphs Abs: 2.6 10*3/uL (ref 0.7–4.0)
Monocytes Absolute: 0.4 10*3/uL (ref 0.1–1.0)
Monocytes Relative: 6 %
Neutro Abs: 4.3 10*3/uL (ref 1.7–7.7)
Neutrophils Relative %: 56 %

## 2019-11-10 LAB — CBC
HCT: 35.3 % — ABNORMAL LOW (ref 36.0–46.0)
Hemoglobin: 12.3 g/dL (ref 12.0–15.0)
MCH: 31.7 pg (ref 26.0–34.0)
MCHC: 34.8 g/dL (ref 30.0–36.0)
MCV: 91 fL (ref 80.0–100.0)
Platelets: 240 10*3/uL (ref 150–400)
RBC: 3.88 MIL/uL (ref 3.87–5.11)
RDW: 13 % (ref 11.5–15.5)
WBC: 7.5 10*3/uL (ref 4.0–10.5)
nRBC: 0 % (ref 0.0–0.2)

## 2019-11-10 LAB — GLUCOSE, CAPILLARY
Glucose-Capillary: 101 mg/dL — ABNORMAL HIGH (ref 70–99)
Glucose-Capillary: 135 mg/dL — ABNORMAL HIGH (ref 70–99)

## 2019-11-10 LAB — COMPREHENSIVE METABOLIC PANEL
ALT: 13 U/L (ref 0–44)
AST: 18 U/L (ref 15–41)
Albumin: 3.7 g/dL (ref 3.5–5.0)
Alkaline Phosphatase: 68 U/L (ref 38–126)
Anion gap: 11 (ref 5–15)
BUN: 16 mg/dL (ref 8–23)
CO2: 21 mmol/L — ABNORMAL LOW (ref 22–32)
Calcium: 9.5 mg/dL (ref 8.9–10.3)
Chloride: 106 mmol/L (ref 98–111)
Creatinine, Ser: 1.35 mg/dL — ABNORMAL HIGH (ref 0.44–1.00)
GFR calc Af Amer: 45 mL/min — ABNORMAL LOW (ref >60)
GFR calc non Af Amer: 39 mL/min — ABNORMAL LOW (ref >60)
Glucose, Bld: 151 mg/dL — ABNORMAL HIGH (ref 70–99)
Potassium: 4 mmol/L (ref 3.5–5.1)
Sodium: 138 mmol/L (ref 135–145)
Total Bilirubin: 0.3 mg/dL (ref 0.3–1.2)
Total Protein: 7.3 g/dL (ref 6.5–8.1)

## 2019-11-10 LAB — APTT: aPTT: 30 seconds (ref 24–36)

## 2019-11-10 LAB — SARS CORONAVIRUS 2 (TAT 6-24 HRS): SARS Coronavirus 2: NEGATIVE

## 2019-11-10 LAB — PROTIME-INR
INR: 0.9 (ref 0.8–1.2)
Prothrombin Time: 12.4 seconds (ref 11.4–15.2)

## 2019-11-10 MED ORDER — INSULIN ASPART 100 UNIT/ML ~~LOC~~ SOLN: 0.0000 [IU] | Freq: Every day | SUBCUTANEOUS | Status: DC

## 2019-11-10 MED ORDER — TRAZODONE HCL 50 MG PO TABS
50.0000 mg | ORAL_TABLET | Freq: Every day | ORAL | Status: DC
  Administered 2019-11-10 – 2019-11-12 (×3): 50 mg via ORAL
  Filled 2019-11-10 (×3): qty 1

## 2019-11-10 MED ORDER — ENOXAPARIN SODIUM 40 MG/0.4ML ~~LOC~~ SOLN
40.0000 mg | SUBCUTANEOUS | Status: DC
  Administered 2019-11-10 – 2019-11-12 (×3): 40 mg via SUBCUTANEOUS
  Filled 2019-11-10 (×4): qty 0.4

## 2019-11-10 MED ORDER — BUPROPION HCL ER (SR) 150 MG PO TB12
150.0000 mg | ORAL_TABLET | Freq: Two times a day (BID) | ORAL | Status: DC
  Administered 2019-11-10 – 2019-11-12 (×5): 150 mg via ORAL
  Filled 2019-11-10 (×9): qty 1

## 2019-11-10 MED ORDER — ACETAMINOPHEN 160 MG/5ML PO SOLN
650.0000 mg | ORAL | Status: DC | PRN
  Filled 2019-11-10: qty 20.3

## 2019-11-10 MED ORDER — ACETAMINOPHEN 325 MG PO TABS
650.0000 mg | ORAL_TABLET | ORAL | Status: DC | PRN
  Administered 2019-11-10 – 2019-11-11 (×2): 650 mg via ORAL
  Filled 2019-11-10 (×2): qty 2

## 2019-11-10 MED ORDER — LOSARTAN POTASSIUM 50 MG PO TABS
100.0000 mg | ORAL_TABLET | Freq: Every day | ORAL | Status: DC
  Administered 2019-11-11 – 2019-11-13 (×3): 100 mg via ORAL
  Filled 2019-11-10 (×4): qty 2

## 2019-11-10 MED ORDER — SENNOSIDES-DOCUSATE SODIUM 8.6-50 MG PO TABS
1.0000 | ORAL_TABLET | Freq: Every evening | ORAL | Status: DC | PRN
  Filled 2019-11-10: qty 1

## 2019-11-10 MED ORDER — ALPRAZOLAM 0.25 MG PO TABS
0.2500 mg | ORAL_TABLET | Freq: Two times a day (BID) | ORAL | Status: DC | PRN
  Administered 2019-11-11 – 2019-11-12 (×2): 0.25 mg via ORAL
  Filled 2019-11-10 (×2): qty 1

## 2019-11-10 MED ORDER — LEVOTHYROXINE SODIUM 50 MCG PO TABS
50.0000 ug | ORAL_TABLET | Freq: Every day | ORAL | Status: DC
  Administered 2019-11-11 – 2019-11-13 (×3): 50 ug via ORAL
  Filled 2019-11-10 (×4): qty 1

## 2019-11-10 MED ORDER — ASPIRIN EC 325 MG PO TBEC
325.0000 mg | DELAYED_RELEASE_TABLET | Freq: Every day | ORAL | Status: DC
  Administered 2019-11-10: 325 mg via ORAL
  Filled 2019-11-10: qty 1

## 2019-11-10 MED ORDER — NICOTINE 21 MG/24HR TD PT24
21.0000 mg | MEDICATED_PATCH | Freq: Every day | TRANSDERMAL | Status: DC
  Administered 2019-11-10 – 2019-11-13 (×4): 21 mg via TRANSDERMAL
  Filled 2019-11-10 (×4): qty 1

## 2019-11-10 MED ORDER — AMLODIPINE BESYLATE 10 MG PO TABS
10.0000 mg | ORAL_TABLET | Freq: Every day | ORAL | Status: DC
  Administered 2019-11-11 – 2019-11-13 (×3): 10 mg via ORAL
  Filled 2019-11-10 (×3): qty 1

## 2019-11-10 MED ORDER — STROKE: EARLY STAGES OF RECOVERY BOOK: Freq: Once | Status: AC

## 2019-11-10 MED ORDER — ACETAMINOPHEN 650 MG RE SUPP: 650.0000 mg | RECTAL | Status: DC | PRN

## 2019-11-10 MED ORDER — INSULIN ASPART 100 UNIT/ML ~~LOC~~ SOLN
0.0000 [IU] | Freq: Three times a day (TID) | SUBCUTANEOUS | Status: DC
  Administered 2019-11-11: 2 [IU] via SUBCUTANEOUS
  Administered 2019-11-11 – 2019-11-12 (×2): 1 [IU] via SUBCUTANEOUS
  Filled 2019-11-10: qty 1

## 2019-11-10 MED ORDER — SODIUM CHLORIDE 0.9 % IV SOLN: INTRAVENOUS | Status: DC

## 2019-11-10 MED ORDER — FLUTICASONE PROPIONATE 50 MCG/ACT NA SUSP
1.0000 | Freq: Every day | NASAL | Status: DC | PRN
  Filled 2019-11-10: qty 16

## 2019-11-10 MED ORDER — VITAMIN D3 25 MCG (1000 UNIT) PO TABS
1000.0000 [IU] | ORAL_TABLET | Freq: Every day | ORAL | Status: DC
  Administered 2019-11-11 – 2019-11-13 (×3): 1000 [IU] via ORAL
  Filled 2019-11-10 (×5): qty 1

## 2019-11-10 MED ORDER — CYANOCOBALAMIN 500 MCG PO TABS
500.0000 ug | ORAL_TABLET | Freq: Every day | ORAL | Status: DC
  Administered 2019-11-11 – 2019-11-12 (×2): 500 ug via ORAL
  Filled 2019-11-10 (×5): qty 1

## 2019-11-10 MED ORDER — ASPIRIN 300 MG RE SUPP
300.0000 mg | Freq: Every day | RECTAL | Status: DC
  Filled 2019-11-10 (×3): qty 1

## 2019-11-10 MED ORDER — METOPROLOL SUCCINATE ER 100 MG PO TB24
200.0000 mg | ORAL_TABLET | Freq: Every day | ORAL | Status: DC
  Administered 2019-11-12 – 2019-11-13 (×2): 200 mg via ORAL
  Filled 2019-11-10 (×4): qty 2
  Filled 2019-11-10: qty 8

## 2019-11-10 MED ORDER — PANTOPRAZOLE SODIUM 40 MG PO TBEC
40.0000 mg | DELAYED_RELEASE_TABLET | Freq: Two times a day (BID) | ORAL | Status: DC
  Administered 2019-11-10 – 2019-11-13 (×6): 40 mg via ORAL
  Filled 2019-11-10 (×6): qty 1

## 2019-11-10 MED ORDER — ONDANSETRON HCL 4 MG/2ML IJ SOLN: 4.0000 mg | Freq: Three times a day (TID) | INTRAMUSCULAR | Status: DC | PRN

## 2019-11-10 MED ORDER — ASPIRIN 325 MG PO TABS
325.0000 mg | ORAL_TABLET | Freq: Every day | ORAL | Status: DC
  Administered 2019-11-11: 325 mg via ORAL
  Filled 2019-11-10 (×2): qty 1

## 2019-11-10 MED ORDER — VENLAFAXINE HCL ER 37.5 MG PO CP24
225.0000 mg | ORAL_CAPSULE | Freq: Every day | ORAL | Status: DC
  Administered 2019-11-11 – 2019-11-13 (×3): 225 mg via ORAL
  Filled 2019-11-10 (×3): qty 6

## 2019-11-10 NOTE — Progress Notes (Signed)
*  PRELIMINARY RESULTS* Echocardiogram 2D Echocardiogram has been performed.  Erin Yates 11/10/2019, 7:06 PM

## 2019-11-10 NOTE — H&P (Signed)
History and Physical    Erin Yates [id number] DOB: September 14, 1946 DOA: 11/10/2019  Referring MD/NP/PA:   PCP: Adin Hector, MD   Patient coming from:  The patient is coming from home.  At baseline, pt is independent for most of ADL.        Chief Complaint: Difficulty speaking  HPI: Erin Yates is a 74 y.o. female with medical history significant of hypertension, hyperlipidemia, diabetes mellitus, stroke, GERD, hypothyroidism, depression with anxiety, PFO, CKD stage III, loop recorder placement in 2017, tobacco abuse, who presents with difficulty speaking.  Per his husband, patient has been having difficulty speaking the past 2 days, which seemed to improve briefly but has been constant since yesterday morning. The patient is frustrated at her inability to speak. Pt has hx of stroke 4 years ago with some deficits in rightward gaze and sensation in her right leg. No new unilateral weakness or numbness in extremities.  Patient has headache.  Per his husband, patient does not seem to have chest pain, shortness of breath, cough, fever, chills, nausea, vomiting, diarrhea, abdominal pain, symptoms of UTI.  ED Course: pt was found to have WBC 7.5, pending COVID-19 PCR, renal function slightly worsened in the baseline, temperature normal, blood pressure 152/72, heart rate 57, oxygen saturation 97% on room air.  CT head showed possible left temporal-occipital stroke.  Patient is placed on MedSurg bed for observation.  Neurologist, Dr. Irish Elders was consulted.  Review of Systems:   General: no fevers, chills, no body weight gain, has fatigue HEENT: no blurry vision, hearing changes or sore throat Respiratory: no dyspnea, coughing, wheezing CV: no chest pain, no palpitations GI: no nausea, vomiting, abdominal pain, diarrhea, constipation GU: no dysuria, burning on urination, increased urinary frequency, hematuria  Ext: no leg edema Neuro: no unilateral weakness, has chronic right leg  numbness, no hearing loss. Has difficulty speaking Skin: no rash, no skin tear. MSK: No muscle spasm, no deformity, no limitation of range of movement in spin Heme: No easy bruising.  Travel history: No recent long distant travel.  Allergy:  Allergies  Allergen Reactions  . Alendronate Other (See Comments)  . Penicillins Rash    Also swelling and itching    Past Medical History:  Diagnosis Date  . Anemia    past  hx  . Anxiety   . Depression   . Diabetes mellitus without complication ([place])   . Dysrhythmia    prior to PFO correction  . GERD (gastroesophageal reflux disease)   . Headache    before PFO closure  . Hypercholesteremia   . Hypertension   . Hypothyroidism   . 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 PFO (patent foramen ovale)    correction device placed 2010  . Right leg numbness    lower leg- residual from CVA  . Seasonal allergies   . Stroke [facility])    prior to PFO correction    Past Surgical History:  Procedure Laterality Date  . ABDOMINAL HYSTERECTOMY    . CARDIAC CATHETERIZATION     [facility]  . CATARACT EXTRACTION W/PHACO Right 09/01/2015   Procedure: CATARACT EXTRACTION PHACO AND INTRAOCULAR LENS PLACEMENT (IOC);  Surgeon: Leandrew Koyanagi, MD;  Location: [place];  Service: Ophthalmology;  Laterality: Right;  DIABETIC - oral meds  . CATARACT EXTRACTION W/PHACO Left 10/20/2015   Procedure: CATARACT EXTRACTION PHACO AND INTRAOCULAR LENS PLACEMENT (IOC);  Surgeon: Leandrew Koyanagi, MD;  Location: [place];  Service: Ophthalmology;  Laterality: Left;  DIABETIC - oral meds  . EP IMPLANTABLE DEVICE N/A 04/04/2016   Procedure: Loop Recorder Insertion;  Surgeon: Deboraha Sprang, MD;  Location: [place] [facility];  Service: Cardiovascular;  Laterality: N/A;  . FOOT NEUROMA SURGERY Left   . NASAL SINUS SURGERY    . PATENT FORAMEN OVALE CLOSURE    . PATENT FORAMEN OVALE CLOSURE    . TEE WITHOUT CARDIOVERSION N/A 02/09/2016    Procedure: TRANSESOPHAGEAL ECHOCARDIOGRAM (TEE);  Surgeon: Adrian Prows, MD;  Location: [facility] ENDOSCOPY;  Service: Cardiovascular;  Laterality: N/A;    Social History:  reports that she has been smoking. She has a 55.00 pack-year smoking history. She has never used smokeless tobacco. She reports that she does not drink alcohol or use drugs.  Family History:  Family History  Problem Relation Age of Onset  . Dementia Father   . Breast cancer Sister 8     Prior to Admission medications   Medication Sig Start Date End Date Taking? Authorizing Provider  ALPRAZolam (XANAX) 0.25 MG tablet Take 0.25 mg by mouth 2 (two) times daily as needed for anxiety.    [provider]  amLODipine (NORVASC) 10 MG tablet Take 10 mg by mouth daily. PM    [provider]  apixaban (ELIQUIS) 5 MG TABS tablet Take 1 tablet (5 mg total) by mouth 2 (two) times daily. 01/01/19   Sherran Needs, NP  atorvastatin (LIPITOR) 80 MG tablet Take 80 mg by mouth daily. PM    [provider]  buPROPion (WELLBUTRIN SR) 150 MG 12 hr tablet Take 1 tablet (150 mg total) by mouth 2 (two) times daily. 03/27/16   Garvin Fila, MD  diltiazem (CARDIZEM) 30 MG tablet Cardizem 30mg  -- take 1 tablet every 4 hours AS NEEDED for heart rate >100 as long as blood pressure >100. 06/14/16   Sherran Needs, NP  ergocalciferol (VITAMIN D2) 50000 UNITS capsule Take 50,000 Units by mouth once a week.    [provider]  fluticasone (FLONASE) 50 MCG/ACT nasal spray Place into both nostrils daily as needed for allergies or rhinitis.    [provider]  hydrochlorothiazide (MICROZIDE) 12.5 MG capsule Take 12.5 mg by mouth daily.    [provider]  levothyroxine (SYNTHROID, LEVOTHROID) 50 MCG tablet Take 50 mcg by mouth daily before breakfast.    [provider]  losartan (COZAAR) 100 MG tablet Take 100 mg by mouth daily. AM    [provider]  metFORMIN (GLUCOPHAGE) 1000 MG tablet Take  1,000 mg by mouth 2 (two) times daily with a meal.    [provider]  metoprolol (TOPROL-XL) 200 MG 24 hr tablet 100 mg daily.     [provider]  pantoprazole (PROTONIX) 40 MG tablet Take 40 mg by mouth 2 (two) times daily. Reported on 04/17/2016    [provider]  traZODone (DESYREL) 50 MG tablet Take 50 mg by mouth at bedtime.    [provider]  venlafaxine XR (EFFEXOR-XR) 150 MG 24 hr capsule Take 150 mg by mouth daily with breakfast.    [provider]  vitamin B-12 (CYANOCOBALAMIN) 500 MCG tablet Take 500 mcg by mouth daily. AM    [provider]    Physical Exam: Vitals:   11/10/19 1345 11/10/19 1443 11/10/19 1521 11/10/19 1820  BP:  (!) 148/73 (!) 137/58 (!) 144/58  Pulse: (!) 52 (!) 50 (!) 50 (!) 50  Resp: 14 18  17 18  Temp:   98.3 F (36.8 C) 97.8 F (36.6 C)  TempSrc:   Oral Oral  SpO2: 95% 96% 95% 96%  Weight:      Height:       General: Not in acute distress HEENT:       Eyes: PERRL, EOMI, no scleral icterus.       ENT: No discharge from the ears and nose, no pharynx injection, no tonsillar enlargement.        Neck: No JVD, no bruit, no mass felt. Heme: No neck lymph node enlargement. Cardiac: S1/S2, RRR, No murmurs, No gallops or rubs. Respiratory: No rales, wheezing, rhonchi or rubs. GI: Soft, nondistended, nontender, no rebound pain, no organomegaly, BS present. GU: No hematuria Ext: No pitting leg edema bilaterally. 2+DP/PT pulse bilaterally. Musculoskeletal: No joint deformities, No joint redness or warmth, no limitation of ROM in spin. Skin: No rashes.  Neuro: Alert, has difficulty speaking, cranial nerves II-XII grossly intact. Muscle strength 5/5 in all extremities, has slightly decreased sensation to light touch intact in right leg.  Psych: Patient is not psychotic, no suicidal or hemocidal ideation.  Labs on Admission: I have personally reviewed following labs and imaging studies  CBC: Recent Labs   Lab 11/10/19 0828  WBC 7.5  NEUTROABS 4.3  HGB 12.3  HCT 35.3*  MCV 91.0  PLT [id number]   Basic Metabolic Panel: Recent Labs  Lab 11/10/19 0828  NA 138  K 4.0  CL 106  CO2 21*  GLUCOSE 151*  BUN 16  CREATININE 1.35*  CALCIUM 9.5   GFR: Estimated Creatinine Clearance: 34.7 mL/min (A) (by C-G formula based on SCr of 1.35 mg/dL (H)). Liver Function Tests: Recent Labs  Lab 11/10/19 0828  AST 18  ALT 13  ALKPHOS 68  BILITOT 0.3  PROT 7.3  ALBUMIN 3.7   No results for input(s): LIPASE, AMYLASE in the last 168 hours. No results for input(s): AMMONIA in the last 168 hours. Coagulation Profile: Recent Labs  Lab 11/10/19 0828  INR 0.9   Cardiac Enzymes: No results for input(s): CKTOTAL, CKMB, CKMBINDEX, TROPONINI in the last 168 hours. BNP (last 3 results) No results for input(s): PROBNP in the last 8760 hours. HbA1C: No results for input(s): HGBA1C in the last 72 hours. CBG: Recent Labs  Lab 11/10/19 1719  GLUCAP 101*   Lipid Profile: No results for input(s): CHOL, HDL, LDLCALC, TRIG, CHOLHDL, LDLDIRECT in the last 72 hours. Thyroid Function Tests: No results for input(s): TSH, T4TOTAL, FREET4, T3FREE, THYROIDAB in the last 72 hours. Anemia Panel: No results for input(s): VITAMINB12, FOLATE, FERRITIN, TIBC, IRON, RETICCTPCT in the last 72 hours. Urine analysis: No results found for: COLORURINE, APPEARANCEUR, LABSPEC, PHURINE, GLUCOSEU, HGBUR, BILIRUBINUR, KETONESUR, PROTEINUR, UROBILINOGEN, NITRITE, LEUKOCYTESUR Sepsis Labs: @LABRCNTIP (procalcitonin:4,lacticidven:4) ) Recent Results (from the past 240 hour(s))  SARS CORONAVIRUS 2 (TAT 6-24 HRS) Nasopharyngeal Nasopharyngeal Swab     Status: None   Collection Time: 11/10/19 10:11 AM   Specimen: Nasopharyngeal Swab  Result Value Ref Range Status   SARS Coronavirus 2 NEGATIVE NEGATIVE Final    Comment: (NOTE) SARS-CoV-2 target nucleic acids are NOT DETECTED. The SARS-CoV-2 RNA is generally detectable in upper  and lower respiratory specimens during the acute phase of infection. Negative results do not preclude SARS-CoV-2 infection, do not rule out co-infections with other pathogens, and should not be used as the sole basis for treatment or other patient management decisions. Negative results must be combined with clinical observations, patient history, and epidemiological  information. The expected result is Negative. Fact Sheet for Patients: SugarRoll.be Fact Sheet for Healthcare Providers: https://www.woods-mathews.com/ This test is not yet approved or cleared by the [place] FDA and  has been authorized for detection and/or diagnosis of SARS-CoV-2 by FDA under an Emergency Use Authorization (EUA). This EUA will remain  in effect (meaning this test can be used) for the duration of the COVID-19 declaration under Section 56 4(b)(1) of the Act, 21 U.S.C. section 360bbb-3(b)(1), unless the authorization is terminated or revoked sooner. Performed at [facility], [place] [address], [place], [place] [address]      Radiological Exams on Admission: CT HEAD WO CONTRAST  Result Date: 11/10/2019 CLINICAL DATA:  Dysarthria EXAM: CT HEAD WITHOUT CONTRAST TECHNIQUE: Contiguous axial images were obtained from the base of the skull through the vertex without intravenous contrast. COMPARISON:  Head CT February 07, 2016; brain MRI February 08, 2016. FINDINGS: Brain: There is mild diffuse atrophy. There is no demonstrable intracranial mass, hemorrhage, extra-axial fluid collection, or midline shift. There is an acute appearing infarct on the left at the left temporal-parietal-occipital junction with localized cytotoxic edema in this area. There are prior infarcts evident in each medial superior occipital lobe as well as in the mid right occipital lobe. There is evidence of a prior infarct involving portions of the head of the caudate nucleus on the right as well as portions  of the anterior limbs of the right internal and external capsule. There is a prior infarct at the gray-white junction of the posterior left frontal lobe. There is mild small vessel disease in the centra semiovale bilaterally. There is evidence of a prior small infarct in the left cerebellum along the inferior dentate nucleus level. Vascular: There is no hyperdense vessel. There is calcification in each carotid siphon region. Skull: The bony calvarium appears intact. Sinuses/Orbits: There is mucosal thickening in several ethmoid air cells. Other visualized paranasal sinuses are clear. Visualized orbits appear symmetric bilaterally. Other: Visualized mastoid air cells are clear. IMPRESSION: 1. Concern for acute infarct at the left temporal-occipital-parietal junction. 2. There is mild atrophy with multiple prior infarcts and periventricular small vessel disease as summarized above. 3.  No demonstrable mass or hemorrhage. 4.  Foci of arterial vascular calcification noted. 5.  There is mucosal thickening in several ethmoid air cells. Electronically Signed   By: Lowella Grip III M.D.   On: 11/10/2019 08:49   US Carotid Bilateral (at [facility] and [facility] only)  Result Date: 11/10/2019 CLINICAL DATA:  Cerebral infarction. History hypertension, hyperlipidemia and coronary artery disease. EXAM: BILATERAL CAROTID DUPLEX ULTRASOUND TECHNIQUE: Pearline Cables scale imaging, color Doppler and duplex ultrasound were performed of bilateral carotid and vertebral arteries in the neck. COMPARISON:  11/19/2007 FINDINGS: Criteria: Quantification of carotid stenosis is based on velocity parameters that correlate the residual internal carotid diameter with NASCET-based stenosis levels, using the diameter of the distal internal carotid lumen as the denominator for stenosis measurement. The following velocity measurements were obtained: RIGHT ICA:  85/20 cm/sec CCA:  [id number] cm/sec SYSTOLIC ICA/CCA RATIO:  1.5 ECA:  76 cm/sec LEFT ICA:  45/11 cm/sec  CCA:  [id number] cm/sec SYSTOLIC ICA/CCA RATIO:  1.1 ECA:  68 cm/sec RIGHT CAROTID ARTERY: Intimal thickening in the common carotid artery and carotid bulb. There is a mild amount of noncalcified plaque at the level of the carotid bulb. Minimal partially calcified plaque in the proximal right ICA. No evidence of significant stenosis with estimated right ICA stenosis of less than 50%. RIGHT  VERTEBRAL ARTERY: Antegrade flow with normal waveform and velocity. LEFT CAROTID ARTERY: Mild calcified plaque at the level of the left ICA and left carotid bulb. No significant stenosis identified with estimated left ICA stenosis of less than 50%. LEFT VERTEBRAL ARTERY: Antegrade flow with normal waveform and velocity. IMPRESSION: Mild plaque at the level of both carotid bulbs and proximal internal carotid arteries without significant carotid stenosis identified. Bilateral estimated ICA stenoses of less than 50%. Electronically Signed   By: Aletta Edouard M.D.   On: 11/10/2019 14:34     EKG: Independently reviewed.  Sinus rhythm, QTC 438, LAD, poor R wave progression Assessment/Plan Principal Problem:   Stroke [facility]) Active Problems:   Depression   GERD (gastroesophageal reflux disease)   Hypertension   Hypothyroidism   Hypercholesteremia   Tobacco abuse   Type II diabetes mellitus with renal manifestations ([place])   Stroke [facility]): Patient has a history of stroke, today CT of the head showed possible left temporal-occipital stroke.  Neurology, Dr. Irish Elders was consulted.  - will place on tele bed for obs - Highly appreciated neurologist's consultation,will follow up recommendations -  - will follow up Neurology's Recs.  - Obtain MRI/MRA and MRA-neck - ASA and lipitor - fasting lipid panel and HbA1c  - 2D transthoracic echocardiography  - swallowing screen. If fails, will get SLP - PT/OT consult  Depression: no SI or HI -continue home meds  GERD (gastroesophageal reflux disease): -PPI  HTN:  -Continue  home medications: Amlodipine, Cozaar, metoprolol -Hold HCTZ  Hypothyroidism: -Synthroid  Hypercholesteremia -Lipitor  Tobacco abuse: -Nicotine patch  Type II diabetes mellitus with renal manifestations ([place]): Last A1c 6.4, well controled. Patient is taking Metformin at home -SSI     DVT ppx: SQ Lovenox Code Status: Full code Family Communication:  Yes, patient's husband at bed side Disposition Plan:  Anticipate discharge back to previous home environment Consults called: Dr. Lyda Perone Admission status: Med-surg bed for obs        Date of Service 11/10/2019    [facility]   If 7PM-7AM, please contact night-coverage [email or web address] Password [facility] 11/10/2019, 6:51 PM

## 2019-11-10 NOTE — ED Provider Notes (Signed)
[facility] Emergency Department Provider Note   ____________________________________________    I have reviewed the triage vital signs and the nursing notes.   HISTORY  Chief Complaint Aphasia   History as per patient and husband  HPI Erin Yates is a 74 y.o. female with a history of diabetes, stroke hypertension, who presents with complaints of difficulty speaking x2 days.  Husband notes that 2 days ago she developed difficulty speaking, and seemed to improve briefly but has been constant since yesterday morning.  The patient is frustrated at her inability to speak.  She does have a history of stroke in the past but last was in 4 years ago.  Apparently has deficits in rightward gaze and sensation in her right leg from prior stroke, complains of frontal headache.  Is able to answer yes/no questions  Past Medical History:  Diagnosis Date  . Anemia    past  hx  . Anxiety   . Depression   . Diabetes mellitus without complication ([place])   . Dysrhythmia    prior to PFO correction  . GERD (gastroesophageal reflux disease)   . Headache    before PFO closure  . Hypercholesteremia   . Hypertension   . Hypothyroidism   . 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 PFO (patent foramen ovale)    correction device placed 2010  . Right leg numbness    lower leg- residual from CVA  . Seasonal allergies   . Stroke [facility])    prior to PFO correction    Patient Active Problem List   Diagnosis Date Noted  . Cryptogenic stroke ([place]) 03/27/2016  . Stroke (cerebrum) ([place])   . CVA (cerebral infarction) 02/07/2016    Past Surgical History:  Procedure Laterality Date  . ABDOMINAL HYSTERECTOMY    . CARDIAC CATHETERIZATION     [facility]  . CATARACT EXTRACTION W/PHACO Right 09/01/2015   Procedure: CATARACT EXTRACTION PHACO AND INTRAOCULAR LENS PLACEMENT (IOC);  Surgeon: Leandrew Koyanagi, MD;  Location: [facility];  Service: Ophthalmology;   Laterality: Right;  DIABETIC - oral meds  . CATARACT EXTRACTION W/PHACO Left 10/20/2015   Procedure: CATARACT EXTRACTION PHACO AND INTRAOCULAR LENS PLACEMENT (IOC);  Surgeon: Leandrew Koyanagi, MD;  Location: [facility];  Service: Ophthalmology;  Laterality: Left;  DIABETIC - oral meds  . EP IMPLANTABLE DEVICE N/A 04/04/2016   Procedure: Loop Recorder Insertion;  Surgeon: Deboraha Sprang, MD;  Location: [place] CV LAB;  Service: Cardiovascular;  Laterality: N/A;  . FOOT NEUROMA SURGERY Left   . NASAL SINUS SURGERY    . PATENT FORAMEN OVALE CLOSURE    . PATENT FORAMEN OVALE CLOSURE    . TEE WITHOUT CARDIOVERSION N/A 02/09/2016   Procedure: TRANSESOPHAGEAL ECHOCARDIOGRAM (TEE);  Surgeon: Adrian Prows, MD;  Location: [facility];  Service: Cardiovascular;  Laterality: N/A;    Prior to Admission medications   Medication Sig Start Date End Date Taking? Authorizing Provider  ALPRAZolam (XANAX) 0.25 MG tablet Take 0.25 mg by mouth 2 (two) times daily as needed for anxiety.    [provider]  amLODipine (NORVASC) 10 MG tablet Take 10 mg by mouth daily. PM    [provider]  apixaban (ELIQUIS) 5 MG TABS tablet Take 1 tablet (5 mg total) by mouth 2 (two) times daily. 01/01/19   Sherran Needs, NP  atorvastatin (LIPITOR) 80 MG tablet Take 80 mg by mouth daily. PM    [provider]  buPROPion (WELLBUTRIN SR) 150 MG 12 hr tablet Take 1 tablet (150 mg total) by mouth 2 (two) times daily. 03/27/16   Garvin Fila, MD  diltiazem (CARDIZEM) 30 MG tablet Cardizem 30mg  -- take 1 tablet every 4 hours AS NEEDED for heart rate >100 as long as blood pressure >100. 06/14/16   Sherran Needs, NP  ergocalciferol (VITAMIN D2) 50000 UNITS capsule Take 50,000 Units by mouth once a week.    [provider]  fluticasone (FLONASE) 50 MCG/ACT nasal spray Place into both nostrils daily as needed for allergies or rhinitis.    [provider]  hydrochlorothiazide  (MICROZIDE) 12.5 MG capsule Take 12.5 mg by mouth daily.    [provider]  levothyroxine (SYNTHROID, LEVOTHROID) 50 MCG tablet Take 50 mcg by mouth daily before breakfast.    [provider]  losartan (COZAAR) 100 MG tablet Take 100 mg by mouth daily. AM    [provider]  metFORMIN (GLUCOPHAGE) 1000 MG tablet Take 1,000 mg by mouth 2 (two) times daily with a meal.    [provider]  metoprolol (TOPROL-XL) 200 MG 24 hr tablet 100 mg daily.     [provider]  pantoprazole (PROTONIX) 40 MG tablet Take 40 mg by mouth 2 (two) times daily. Reported on 04/17/2016    [provider]  traZODone (DESYREL) 50 MG tablet Take 50 mg by mouth at bedtime.    [provider]  venlafaxine XR (EFFEXOR-XR) 150 MG 24 hr capsule Take 150 mg by mouth daily with breakfast.    [provider]  vitamin B-12 (CYANOCOBALAMIN) 500 MCG tablet Take 500 mcg by mouth daily. AM    [provider]     Allergies Alendronate and Penicillins  Family History  Problem Relation Age of Onset  . Dementia Father   . Breast cancer Sister 82    Social History Social History   Tobacco Use  . Smoking status: Current Every Day Smoker    Packs/day: 1.00    Years: 55.00    Pack years: 55.00  . Smokeless tobacco: Never Used  Substance Use Topics  . Alcohol use: No  . Drug use: No    Review of Systems  Constitutional: No reports of fever Eyes: Rightward gaze deficit, chronic ENT: No difficulty swallowing reported Cardiovascular: Denies chest pain. Respiratory: No cough Gastrointestinal: No abdominal pain Genitourinary: Negative for dysuria. Musculoskeletal: No new weakness Skin: Negative for rash. Neurological: Difficulty speaking   ____________________________________________   PHYSICAL EXAM:  VITAL SIGNS: ED Triage Vitals  Enc Vitals Group     BP 11/10/19 0822 (!) 152/72     Pulse Rate 11/10/19 0822 (!) 57     Resp 11/10/19  0822 18     Temp 11/10/19 0822 98.3 F (36.8 C)     Temp Source 11/10/19 0822 Oral     SpO2 11/10/19 0822 97 %     Weight 11/10/19 0823 60 kg (132 lb 4.4 oz)     Height 11/10/19 0823 1.676 m (5\' 6" )     Head Circumference --      Peak Flow --      Pain Score 11/10/19 0823 0     Pain Loc --      Pain Edu? --      Excl. in [place]? --     Constitutional: Alert, visibly frustrated Eyes: Conjunctivae are normal.  Unable to gaze to the right, reportedly chronic Head: Atraumatic. Nose: No congestion/rhinnorhea. Mouth/Throat: Mucous membranes  are moist.   Neck: No bruit Cardiovascular: Normal rate, regular rhythm.  Good peripheral circulation. Respiratory: Normal respiratory effort.  No retractions. Lungs CTAB. Gastrointestinal: Soft and nontender.   Musculoskeletal: Is able to move all extremities Neurologic: Impaired speech consistent with aphasia, no other cranial nerve deficits noted, NIH stroke scale of 5 given prior deficits Skin:  Skin is warm, dry and intact. No rash noted. Psychiatric: Mood and affect are normal. Speech and behavior are normal.  ____________________________________________   LABS (all labs ordered are listed, but only abnormal results are displayed)  Labs Reviewed  CBC - Abnormal; Notable for the following components:      Result Value   HCT 35.3 (*)    All other components within normal limits  COMPREHENSIVE METABOLIC PANEL - Abnormal; Notable for the following components:   CO2 21 (*)    Glucose, Bld 151 (*)    Creatinine, Ser 1.35 (*)    GFR calc non Af Amer 39 (*)    GFR calc Af Amer 45 (*)    All other components within normal limits  SARS CORONAVIRUS 2 (TAT 6-24 HRS)  PROTIME-INR  APTT  DIFFERENTIAL   ____________________________________________  EKG  ED ECG REPORT I, Lavonia Drafts, the attending physician, personally viewed and interpreted this ECG.  Date: 11/10/2019  Rhythm: normal sinus rhythm QRS Axis: normal Intervals:  normal ST/T Wave abnormalities: normal Narrative Interpretation: no evidence of acute ischemia  ____________________________________________  RADIOLOGY  CT head consistent with acute infarct ____________________________________________   PROCEDURES  Procedure(s) performed: No  Procedures   Critical Care performed: no ____________________________________________   INITIAL IMPRESSION / ASSESSMENT AND PLAN / ED COURSE  Pertinent labs & imaging results that were available during my care of the patient were reviewed by me and considered in my medical decision making (see chart for details).  Patient presents with expressive aphasia x2 days, she is outside the window for TPA.  No other new deficits noted.  NIH stroke scale of 5.  Nurse notes some difficulty swallowing on swallow study.  Will require admission for MRI, stroke work-up/neurology evaluation  Have consulted hospitalist service for admission    ____________________________________________   FINAL CLINICAL IMPRESSION(S) / ED DIAGNOSES  Final diagnoses:  Cerebrovascular accident (CVA), unspecified mechanism ([place])        Note:  This document was prepared using Dragon voice recognition software and may include unintentional dictation errors.   Lavonia Drafts, MD 11/10/19 [phone]

## 2019-11-10 NOTE — ED Notes (Signed)
Admitting MD at bedside at this time.

## 2019-11-10 NOTE — ED Notes (Signed)
EDP at bedside at this time. Pt refusing to be admitted to hospital due to feeling anxious about Covid at this time. EDP speaking with patient regarding risks of patient not being admitted. Pt noted to have difficulty speaking at this time, fragmented speech noted at this time. Pt's husband at bedside at this time.

## 2019-11-10 NOTE — Progress Notes (Signed)
Report given to [employer]. Patient to be transported to floor.

## 2019-11-10 NOTE — Evaluation (Signed)
Physical Therapy Evaluation Patient Details Name: Erin Yates MRN: [id number] DOB: 1945/12/31 Today's Date: 11/10/2019   History of Present Illness  From MD notes: Pt is a 74 y.o. female with a history of diabetes, stroke hypertension, who presents with complaints of difficulty speaking x2 days.  Pt does have a history of stroke in the past but last was 4 years ago.  Apparently has deficits in rightward gaze, peripheral vision, and sensation in her right leg from prior stroke, complains of frontal headache.  Is able to answer yes/no questions.  MD head CT impression includes: Concern for acute infarct at the left temporal-occipital-parietal junction.    Clinical Impression  Pt presented with deficits in strength, transfers, mobility, gait, balance, and activity tolerance.  Pt presented with expressive aphasia during the session making assessment challenging but was able to occasionally find appropriate words to answer questions or was asked yes/no questions to assist.  Pt did require occasional extra cuing and time to follow commands as well but receptive deficits were much less pronounced than expressive. Pt was able to communicate a feeling of general weakness compared to her baseline.  Pt presented with gross weakness in all extremities and with mild deficits in coordination to the BUEs but equal left/right.  Pt did not require physical assistance with any functional mobility tasks but fatigued quickly and required extra time and effort.  Pt ambulated a max of 100' with slow cadence and short B step length before requiring to return to sitting but was steady throughout.  Will make pt 2x/wk secondary to pt's primary deficit being verbal rather than functional.  Pt will benefit from HHPT services upon discharge to safely address above deficits for decreased caregiver assistance and eventual return to PLOF.       Follow Up Recommendations Home health PT;Supervision for mobility/OOB    Equipment  Recommendations  None recommended by PT    Recommendations for Other Services       Precautions / Restrictions Precautions Precautions: Fall Restrictions Weight Bearing Restrictions: No      Mobility  Bed Mobility Overal bed mobility: Modified Independent             General bed mobility comments: Extra time required but did not need to use the bed rail  Transfers Overall transfer level: Needs assistance Equipment used: Rolling walker (2 wheeled) Transfers: Sit to/from Stand Sit to Stand: Supervision         General transfer comment: Good eccentric and concentric control  Ambulation/Gait Ambulation/Gait assistance: Supervision Gait Distance (Feet): 100 Feet Assistive device: Rolling walker (2 wheeled) Gait Pattern/deviations: Step-through pattern;Decreased step length - right;Decreased step length - left Gait velocity: decreased   General Gait Details: Slow cadence with amb but steady without LOB; SpO2 and HR WNL on room air  Stairs            Wheelchair Mobility    Modified Rankin (Stroke Patients Only)       Balance Overall balance assessment: Needs assistance   Sitting balance-Leahy Scale: Normal     Standing balance support: Bilateral upper extremity supported Standing balance-Leahy Scale: Good                               Pertinent Vitals/Pain Pain Assessment: No/denies pain    Home Living Family/patient expects to be discharged to:: Private residence Living Arrangements: Spouse/significant other Available Help at Discharge: Family;Available 24 hours/day Type of Home: House  Home Access: Stairs to enter Entrance Stairs-Rails: Right;Left;Can reach both Entrance Stairs-Number of Steps: 3 Home Layout: Two level;Able to live on main level with bedroom/bathroom Home Equipment: Kasandra Knudsen - single point;Walker - 2 wheels      Prior Function Level of Independence: Independent         Comments: Ind amb community distances  without an AD, no fall history, Ind with ADLs     Hand Dominance   Dominant Hand: Right    Extremity/Trunk Assessment   Upper Extremity Assessment Upper Extremity Assessment: Generalized weakness;RUE deficits/detail;LUE deficits/detail RUE Deficits / Details: Strength grossly 4-/5 and equal left/right; mod deficits with finger to nose test but equal left/right RUE Sensation: WNL RUE Coordination: decreased fine motor LUE Deficits / Details: Strength grossly 4-/5 and equal left/right; mod deficits with finger to nose test but equal left/right LUE Sensation: WNL LUE Coordination: decreased fine motor    Lower Extremity Assessment Lower Extremity Assessment: Generalized weakness;RLE deficits/detail;LLE deficits/detail RLE Deficits / Details: strength grossly 3+ to 4-/5 but equal left/right; decreased sensation to light touch that is chronic per pt and chart review RLE Sensation: decreased light touch RLE Coordination: WNL LLE Deficits / Details: strength grossly 3+ to 4-/5 but equal left/right LLE Sensation: WNL LLE Coordination: WNL       Communication   Communication: Expressive difficulties  Cognition Arousal/Alertness: Awake/alert Behavior During Therapy: WFL for tasks assessed/performed Overall Cognitive Status: Difficult to assess                                        General Comments      Exercises Total Joint Exercises Ankle Circles/Pumps: AROM;Strengthening;Both;10 reps Quad Sets: Strengthening;Both;10 reps Gluteal Sets: Strengthening;Both;10 reps Towel Squeeze: Strengthening;Both;10 reps Hip ABduction/ADduction: AROM;Both;10 reps Straight Leg Raises: AROM;Both;10 reps Long Arc Quad: Strengthening;Both;10 reps Knee Flexion: Strengthening;Both;10 reps Marching in Standing: AROM;Both;10 reps;Standing   Assessment/Plan    PT Assessment Patient needs continued PT services  PT Problem List Decreased strength;Decreased activity  tolerance;Decreased balance       PT Treatment Interventions DME instruction;Gait training;Stair training;Functional mobility training;Therapeutic activities;Therapeutic exercise;Balance training;Patient/family education    PT Goals (Current goals can be found in the Care Plan section)  Acute Rehab PT Goals PT Goal Formulation: Patient unable to participate in goal setting Time For Goal Achievement: 11/23/19 Potential to Achieve Goals: Good    Frequency Min 2X/week   Barriers to discharge        Co-evaluation               AM-PAC PT "6 Clicks" Mobility  Outcome Measure Help needed turning from your back to your side while in a flat bed without using bedrails?: None Help needed moving from lying on your back to sitting on the side of a flat bed without using bedrails?: None Help needed moving to and from a bed to a chair (including a wheelchair)?: A Little Help needed standing up from a chair using your arms (e.g., wheelchair or bedside chair)?: A Little Help needed to walk in hospital room?: A Little Help needed climbing 3-5 steps with a railing? : A Little 6 Click Score: 20    End of Session Equipment Utilized During Treatment: Gait belt Activity Tolerance: Patient tolerated treatment well Patient left: in chair;with call bell/phone within reach;with chair alarm set;Other (comment)(Pt left with SLP for swallow study) Nurse Communication: Mobility status PT Visit Diagnosis: Muscle weakness (generalized) (  M62.81);Difficulty in walking, not elsewhere classified (R26.2);Other symptoms and signs involving the nervous system (R29.898)    Time: [id number] PT Time Calculation (min) (ACUTE ONLY): 41 min   Charges:   PT Evaluation $PT Eval Moderate Complexity: 1 Mod PT Treatments $Therapeutic Exercise: 8-22 mins        D. Royetta Asal PT, DPT 11/10/19, 5:09 PM

## 2019-11-10 NOTE — Consult Note (Addendum)
Reason for Consult: aphasia  Referring Physician: Dr. Blaine Hamper  CC: aphasia   HPI: Erin Yates is an 74 y.o. female with a history of diabetes, stroke hypertension, who presents with complaints of difficulty speaking since Saturday. No other clear focality on examination. Not TPA/CTA candidate as was outside the window.  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    Past Medical History:  Diagnosis Date  . Anemia    past  hx  . Anxiety   . Depression   . Diabetes mellitus without complication ([place])   . Dysrhythmia    prior to PFO correction  . GERD (gastroesophageal reflux disease)   . Headache    before PFO closure  . Hypercholesteremia   . Hypertension   . Hypothyroidism   . 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 PFO (patent foramen ovale)    correction device placed 2010  . Right leg numbness    lower leg- residual from CVA  . Seasonal allergies   . Stroke [facility])    prior to PFO correction    Past Surgical History:  Procedure Laterality Date  . ABDOMINAL HYSTERECTOMY    . CARDIAC CATHETERIZATION     [facility]  . CATARACT EXTRACTION W/PHACO Right 09/01/2015   Procedure: CATARACT EXTRACTION PHACO AND INTRAOCULAR LENS PLACEMENT (IOC);  Surgeon: Leandrew Koyanagi, MD;  Location: [place];  Service: Ophthalmology;  Laterality: Right;  DIABETIC - oral meds  . CATARACT EXTRACTION W/PHACO Left 10/20/2015   Procedure: CATARACT EXTRACTION PHACO AND INTRAOCULAR LENS PLACEMENT (IOC);  Surgeon: Leandrew Koyanagi, MD;  Location: [place];  Service: Ophthalmology;  Laterality: Left;  DIABETIC - oral meds  . EP IMPLANTABLE DEVICE N/A 04/04/2016   Procedure: Loop Recorder Insertion;  Surgeon: Deboraha Sprang, MD;  Location: [place] CV LAB;  Service: Cardiovascular;  Laterality: N/A;  . FOOT NEUROMA SURGERY Left   . NASAL SINUS SURGERY    . PATENT FORAMEN OVALE CLOSURE    . PATENT FORAMEN OVALE CLOSURE    . TEE WITHOUT CARDIOVERSION N/A  02/09/2016   Procedure: TRANSESOPHAGEAL ECHOCARDIOGRAM (TEE);  Surgeon: Adrian Prows, MD;  Location: [facility] ENDOSCOPY;  Service: Cardiovascular;  Laterality: N/A;    Family History  Problem Relation Age of Onset  . Dementia Father   . Breast cancer Sister 59    Social History:  reports that she has been smoking. She has a 55.00 pack-year smoking history. She has never used smokeless tobacco. She reports that she does not drink alcohol or use drugs.  Allergies  Allergen Reactions  . Alendronate Other (See Comments)  . Penicillins Rash    Also swelling and itching    Medications: I have reviewed the patient's current medications.  ROS:  difficult to obtain due to aphasia.   Physical Examination: Blood pressure (!) 152/67, pulse (!) 51, temperature 98.3 F (36.8 C), temperature source Oral, resp. rate 14, height 5\' 6"  (1.676 m), weight 60 kg, SpO2 97 %.    Neurological Examination   Mental Status: Alert, oriented. Expressive aphasia.   Cranial Nerves: II: Discs flat bilaterally; Visual fields grossly normal, pupils equal, round, reactive to light and accommodation III,IV, VI: ptosis not present, extra-ocular motions intact bilaterally V,VII: smile symmetric, facial light touch sensation normal bilaterally VIII: hearing normal bilaterally XI: bilateral shoulder shrug XII: midline tongue extension Motor: Right : Upper extremity   4+/5    Left:     Upper extremity   4+/5  Lower extremity   4/5     Lower extremity   4/5 Tone and bulk:normal tone throughout; no atrophy noted Sensory: Pinprick and light touch intact throughout, bilaterally Deep Tendon Reflexes: 1+ and symmetric throughout Plantars: Right: downgoing   Left: downgoing Cerebellar: normal finger-to-nose      Laboratory Studies:   Basic Metabolic Panel: Recent Labs  Lab 11/10/19 0828  NA 138  K 4.0  CL 106  CO2 21*  GLUCOSE 151*  BUN 16  CREATININE 1.35*  CALCIUM 9.5    Liver Function Tests: Recent Labs   Lab 11/10/19 0828  AST 18  ALT 13  ALKPHOS 68  BILITOT 0.3  PROT 7.3  ALBUMIN 3.7   No results for input(s): LIPASE, AMYLASE in the last 168 hours. No results for input(s): AMMONIA in the last 168 hours.  CBC: Recent Labs  Lab 11/10/19 0828  WBC 7.5  NEUTROABS 4.3  HGB 12.3  HCT 35.3*  MCV 91.0  PLT 240    Cardiac Enzymes: No results for input(s): CKTOTAL, CKMB, CKMBINDEX, TROPONINI in the last 168 hours.  BNP: Invalid input(s): POCBNP  CBG: No results for input(s): GLUCAP in the last 168 hours.  Microbiology: Results for orders placed or performed during the hospital encounter of 02/07/16  MRSA PCR Screening     Status: None   Collection Time: 02/07/16  2:47 PM   Specimen: Nasal Mucosa; Nasopharyngeal  Result Value Ref Range Status   MRSA by PCR NEGATIVE NEGATIVE Final    Comment:        The GeneXpert MRSA Assay (FDA approved for NASAL specimens only), is one component of a comprehensive MRSA colonization surveillance program. It is not intended to diagnose MRSA infection nor to guide or monitor treatment for MRSA infections.     Coagulation Studies: Recent Labs    11/10/19 0828  LABPROT 12.4  INR 0.9    Urinalysis: No results for input(s): COLORURINE, LABSPEC, PHURINE, GLUCOSEU, HGBUR, BILIRUBINUR, KETONESUR, PROTEINUR, UROBILINOGEN, NITRITE, LEUKOCYTESUR in the last 168 hours.  Invalid input(s): APPERANCEUR  Lipid Panel:     Component Value Date/Time   CHOL 145 02/08/2016 0250   TRIG 361 (H) 02/08/2016 0250   HDL 34 (L) 02/08/2016 0250   CHOLHDL 4.3 02/08/2016 0250   VLDL 72 (H) 02/08/2016 0250   LDLCALC 39 02/08/2016 0250    HgbA1C:  Lab Results  Component Value Date   HGBA1C 6.4 (H) 02/08/2016    Urine Drug Screen:  No results found for: LABOPIA, COCAINSCRNUR, LABBENZ, AMPHETMU, THCU, LABBARB  Alcohol Level: No results for input(s): ETH in the last 168 hours.  Other results: EKG: normal EKG, normal sinus rhythm, unchanged  from previous tracings.  Imaging: CT HEAD WO CONTRAST  Result Date: 11/10/2019 CLINICAL DATA:  Dysarthria EXAM: CT HEAD WITHOUT CONTRAST TECHNIQUE: Contiguous axial images were obtained from the base of the skull through the vertex without intravenous contrast. COMPARISON:  Head CT February 07, 2016; brain MRI February 08, 2016. FINDINGS: Brain: There is mild diffuse atrophy. There is no demonstrable intracranial mass, hemorrhage, extra-axial fluid collection, or midline shift. There is an acute appearing infarct on the left at the left temporal-parietal-occipital junction with localized cytotoxic edema in this area. There are prior infarcts evident in each medial superior occipital lobe as well as in the mid right occipital lobe. There is evidence of a prior infarct involving portions of the head of the caudate nucleus on the right as well as portions of the anterior limbs of the  right internal and external capsule. There is a prior infarct at the gray-white junction of the posterior left frontal lobe. There is mild small vessel disease in the centra semiovale bilaterally. There is evidence of a prior small infarct in the left cerebellum along the inferior dentate nucleus level. Vascular: There is no hyperdense vessel. There is calcification in each carotid siphon region. Skull: The bony calvarium appears intact. Sinuses/Orbits: There is mucosal thickening in several ethmoid air cells. Other visualized paranasal sinuses are clear. Visualized orbits appear symmetric bilaterally. Other: Visualized mastoid air cells are clear. IMPRESSION: 1. Concern for acute infarct at the left temporal-occipital-parietal junction. 2. There is mild atrophy with multiple prior infarcts and periventricular small vessel disease as summarized above. 3.  No demonstrable mass or hemorrhage. 4.  Foci of arterial vascular calcification noted. 5.  There is mucosal thickening in several ethmoid air cells. Electronically Signed   By: Lowella Grip III M.D.   On: 11/10/2019 08:49     Assessment/Plan:   74 y.o. female with a history of diabetes, stroke hypertension, who presents with complaints of difficulty speaking since Saturday. No other clear focality on examination. Not TPA/CTA candidate as was outside the window.  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   Patient has a loop recorder. Was on ASA 81mg  prior to admission  - Awaiting MRI and MRA h/n and awaiting call back from MRI to see if loop recorder if compatible - If unable to obtain MRI would repeat CTH in AM along with CTA head and neck to see the evolution of the L temporal ischemia - ASA 325 ordered.  -pt/ot/speech   Addendum: Discussed with MRI department. MRI head, MRA h/n ordered  11/10/2019, 1:27 PM

## 2019-11-10 NOTE — ED Triage Notes (Signed)
Pt from home with husband who reports pt has hx of TIA, states that pt began having difficulty speaking and "finding words" Saturday while at home. Pt Alert and Oriented but is having spells of aphasia. Pt denies pain.

## 2019-11-10 NOTE — ED Notes (Signed)
Patient transported to CT 

## 2019-11-10 NOTE — Evaluation (Signed)
Clinical/Bedside Swallow Evaluation Patient Details  Name: Erin Yates MRN: [id number] Date of Birth: 05/11/1946  Today's Date: 11/10/2019 Time: SLP Start Time (ACUTE ONLY): [id number] SLP Stop Time (ACUTE ONLY): 1729 SLP Time Calculation (min) (ACUTE ONLY): 46 min  Past Medical History:  Past Medical History:  Diagnosis Date  . Anemia    past  hx  . Anxiety   . Depression   . Diabetes mellitus without complication ([place])   . Dysrhythmia    prior to PFO correction  . GERD (gastroesophageal reflux disease)   . Headache    before PFO closure  . Hypercholesteremia   . Hypertension   . Hypothyroidism   . 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 PFO (patent foramen ovale)    correction device placed 2010  . Right leg numbness    lower leg- residual from CVA  . Seasonal allergies   . Stroke [facility])    prior to PFO correction   Past Surgical History:  Past Surgical History:  Procedure Laterality Date  . ABDOMINAL HYSTERECTOMY    . CARDIAC CATHETERIZATION     [facility]  . CATARACT EXTRACTION W/PHACO Right 09/01/2015   Procedure: CATARACT EXTRACTION PHACO AND INTRAOCULAR LENS PLACEMENT (IOC);  Surgeon: Leandrew Koyanagi, MD;  Location: [place];  Service: Ophthalmology;  Laterality: Right;  DIABETIC - oral meds  . CATARACT EXTRACTION W/PHACO Left 10/20/2015   Procedure: CATARACT EXTRACTION PHACO AND INTRAOCULAR LENS PLACEMENT (IOC);  Surgeon: Leandrew Koyanagi, MD;  Location: [place];  Service: Ophthalmology;  Laterality: Left;  DIABETIC - oral meds  . EP IMPLANTABLE DEVICE N/A 04/04/2016   Procedure: Loop Recorder Insertion;  Surgeon: Deboraha Sprang, MD;  Location: [place] CV LAB;  Service: Cardiovascular;  Laterality: N/A;  . FOOT NEUROMA SURGERY Left   . NASAL SINUS SURGERY    . PATENT FORAMEN OVALE CLOSURE    . PATENT FORAMEN OVALE CLOSURE    . TEE WITHOUT CARDIOVERSION N/A 02/09/2016   Procedure: TRANSESOPHAGEAL ECHOCARDIOGRAM (TEE);  Surgeon: Adrian Prows, MD;  Location: [facility] ENDOSCOPY;  Service: Cardiovascular;  Laterality: N/A;   HPI:  Pt is a 74 y.o. female with a history of diabetes, stroke hypertension, who presents with complaints of difficulty speaking x2 days.  Pt does have a history of stroke in the past but last was 4 years ago -- Residual deficits in rightward gaze, peripheral vision, and sensation in her right leg from Prior stroke, complains of frontal headache, GERD, anxiety/depression.  Is able to answer yes/no questions; noted expressive speech deficits - Dysfluency+.  MD head CT impression includes: Concern for acute infarct at the left temporal-occipital-parietal junction.  MRI pending.    Assessment / Plan / Recommendation Clinical Impression  Pt appears to present w/ adequate oropharyngeal phase swallow function w/ no overt clinical s/s of aspiration noted; reduced risk for aspiration when following general aspiration precautions. Pt stated having her meats cut would be beneficial. Oral phase WFL for bolus management and oral clearing. OM exam WFL for strength and ROM/tone. Pt fed self w/ setup support initially. Recommend a mech soft/regular diet (meats cut, moistened) w/ Thin liquids; general aspiration precautions; GERD precautions(baseline). Recommend Pills WHOLE in Puree for easier, safer swallowing - pt agreed.  No further skilled ST services indicated for swallowing at this time as pt appears at her baseline. Pt does exhibit Dysfluency of speech w/ possible expressive/receptive language deficits. Recommend further more formal language assessment to determine needs and appropriate  recommendations for f/u of services(suspect Home Health would be appropriate).  SLP Visit Diagnosis: Dysphagia, unspecified (R13.10)    Aspiration Risk  (reduced following general precautions)    Diet Recommendation     Medication Administration: Whole meds with puree(for easier, safer swallowing)    Other  Recommendations Recommended Consults:  (n/a) Oral Care Recommendations: Oral care BID;Patient independent with oral care Other Recommendations: (n/a)   Follow up Recommendations None(for swallowing)      Frequency and Duration (n/a)  (n/a)       Prognosis Prognosis for Safe Diet Advancement: Good Barriers to Reach Goals: (n/a)      Swallow Study   General Date of Onset: 11/10/19 HPI: Pt is a 74 y.o. female with a history of diabetes, stroke hypertension, who presents with complaints of difficulty speaking x2 days.  Pt does have a history of stroke in the past but last was 4 years ago -- Residual deficits in rightward gaze, peripheral vision, and sensation in her right leg from Prior stroke, complains of frontal headache, GERD, anxiety/depression.  Is able to answer yes/no questions; noted expressive speech deficits - Dysfluency+.  MD head CT impression includes: Concern for acute infarct at the left temporal-occipital-parietal junction.  MRI pending.  Type of Study: Bedside Swallow Evaluation Previous Swallow Assessment: none Diet Prior to this Study: NPO(regular diet at home) Temperature Spikes Noted: No(wbc 7.5) Respiratory Status: Room air History of Recent Intubation: No Behavior/Cognition: Alert;Cooperative;Pleasant mood(Dysfluency) Oral Cavity Assessment: Within Functional Limits Oral Care Completed by SLP: Yes Oral Cavity - Dentition: Adequate natural dentition Vision: Functional for self-feeding Self-Feeding Abilities: Able to feed self;Needs set up(min) Patient Positioning: Upright in chair Baseline Vocal Quality: Normal;Low vocal intensity Volitional Cough: Strong Volitional Swallow: Able to elicit    Oral/Motor/Sensory Function Overall Oral Motor/Sensory Function: Within functional limits   Ice Chips Ice chips: Within functional limits Presentation: Spoon(fed; 3 trials)   Thin Liquid Thin Liquid: Within functional limits Presentation: Cup;Self Fed(~4 ozs total during session)    Nectar Thick Nectar  Thick Liquid: Not tested   Honey Thick Honey Thick Liquid: Not tested   Puree Puree: Within functional limits Presentation: Spoon;Self Fed(10 trials)   Solid     Solid: Within functional limits(grossly) Presentation: Self Fed;Spoon(6 trials) Other Comments: cut foods/meats would be beneficial for ease of eating per pt      Rashawna Scoles 11/10/2019,5:27 PM

## 2019-11-10 NOTE — ED Notes (Signed)
Pt assisted to the bathroom as standby assist by this RN. Pt back to bed at this time. Pt tolerated well. Pt's husband remains at bedside at this time.

## 2019-11-11 ENCOUNTER — Observation Stay: Payer: Medicare HMO

## 2019-11-11 DIAGNOSIS — E7800 Pure hypercholesterolemia, unspecified: Secondary | ICD-10-CM | POA: Diagnosis not present

## 2019-11-11 DIAGNOSIS — Z20822 Contact with and (suspected) exposure to covid-19: Secondary | ICD-10-CM | POA: Diagnosis present

## 2019-11-11 DIAGNOSIS — Q211 Atrial septal defect: Secondary | ICD-10-CM

## 2019-11-11 DIAGNOSIS — I69398 Other sequelae of cerebral infarction: Secondary | ICD-10-CM | POA: Diagnosis not present

## 2019-11-11 DIAGNOSIS — N179 Acute kidney failure, unspecified: Secondary | ICD-10-CM | POA: Diagnosis present

## 2019-11-11 DIAGNOSIS — I69341 Monoplegia of lower limb following cerebral infarction affecting right dominant side: Secondary | ICD-10-CM | POA: Diagnosis not present

## 2019-11-11 DIAGNOSIS — F329 Major depressive disorder, single episode, unspecified: Secondary | ICD-10-CM | POA: Diagnosis present

## 2019-11-11 DIAGNOSIS — K219 Gastro-esophageal reflux disease without esophagitis: Secondary | ICD-10-CM | POA: Diagnosis present

## 2019-11-11 DIAGNOSIS — I63532 Cerebral infarction due to unspecified occlusion or stenosis of left posterior cerebral artery: Secondary | ICD-10-CM | POA: Diagnosis present

## 2019-11-11 DIAGNOSIS — Z720 Tobacco use: Secondary | ICD-10-CM

## 2019-11-11 DIAGNOSIS — I480 Paroxysmal atrial fibrillation: Secondary | ICD-10-CM

## 2019-11-11 DIAGNOSIS — R471 Dysarthria and anarthria: Secondary | ICD-10-CM | POA: Diagnosis present

## 2019-11-11 DIAGNOSIS — I6389 Other cerebral infarction: Secondary | ICD-10-CM | POA: Diagnosis not present

## 2019-11-11 DIAGNOSIS — H538 Other visual disturbances: Secondary | ICD-10-CM | POA: Diagnosis present

## 2019-11-11 DIAGNOSIS — I639 Cerebral infarction, unspecified: Secondary | ICD-10-CM | POA: Diagnosis present

## 2019-11-11 DIAGNOSIS — E1151 Type 2 diabetes mellitus with diabetic peripheral angiopathy without gangrene: Secondary | ICD-10-CM | POA: Diagnosis present

## 2019-11-11 DIAGNOSIS — E039 Hypothyroidism, unspecified: Secondary | ICD-10-CM | POA: Diagnosis present

## 2019-11-11 DIAGNOSIS — I059 Rheumatic mitral valve disease, unspecified: Secondary | ICD-10-CM | POA: Diagnosis not present

## 2019-11-11 DIAGNOSIS — Z880 Allergy status to penicillin: Secondary | ICD-10-CM | POA: Diagnosis not present

## 2019-11-11 DIAGNOSIS — I63522 Cerebral infarction due to unspecified occlusion or stenosis of left anterior cerebral artery: Secondary | ICD-10-CM | POA: Diagnosis present

## 2019-11-11 DIAGNOSIS — I10 Essential (primary) hypertension: Secondary | ICD-10-CM | POA: Diagnosis not present

## 2019-11-11 DIAGNOSIS — E785 Hyperlipidemia, unspecified: Secondary | ICD-10-CM | POA: Diagnosis present

## 2019-11-11 DIAGNOSIS — I361 Nonrheumatic tricuspid (valve) insufficiency: Secondary | ICD-10-CM | POA: Diagnosis not present

## 2019-11-11 DIAGNOSIS — I4891 Unspecified atrial fibrillation: Secondary | ICD-10-CM | POA: Diagnosis not present

## 2019-11-11 DIAGNOSIS — E781 Pure hyperglyceridemia: Secondary | ICD-10-CM | POA: Diagnosis present

## 2019-11-11 DIAGNOSIS — I6523 Occlusion and stenosis of bilateral carotid arteries: Secondary | ICD-10-CM | POA: Diagnosis not present

## 2019-11-11 DIAGNOSIS — R29705 NIHSS score 5: Secondary | ICD-10-CM | POA: Diagnosis present

## 2019-11-11 DIAGNOSIS — E1122 Type 2 diabetes mellitus with diabetic chronic kidney disease: Secondary | ICD-10-CM | POA: Diagnosis present

## 2019-11-11 DIAGNOSIS — I340 Nonrheumatic mitral (valve) insufficiency: Secondary | ICD-10-CM | POA: Diagnosis not present

## 2019-11-11 DIAGNOSIS — I129 Hypertensive chronic kidney disease with stage 1 through stage 4 chronic kidney disease, or unspecified chronic kidney disease: Secondary | ICD-10-CM | POA: Diagnosis present

## 2019-11-11 DIAGNOSIS — R4701 Aphasia: Secondary | ICD-10-CM | POA: Diagnosis present

## 2019-11-11 DIAGNOSIS — Z888 Allergy status to other drugs, medicaments and biological substances status: Secondary | ICD-10-CM | POA: Diagnosis not present

## 2019-11-11 DIAGNOSIS — N183 Chronic kidney disease, stage 3 unspecified: Secondary | ICD-10-CM | POA: Diagnosis present

## 2019-11-11 DIAGNOSIS — Z8774 Personal history of (corrected) congenital malformations of heart and circulatory system: Secondary | ICD-10-CM | POA: Diagnosis not present

## 2019-11-11 DIAGNOSIS — Z90710 Acquired absence of both cervix and uterus: Secondary | ICD-10-CM | POA: Diagnosis not present

## 2019-11-11 LAB — BASIC METABOLIC PANEL
Anion gap: 11 (ref 5–15)
BUN: 15 mg/dL (ref 8–23)
CO2: 22 mmol/L (ref 22–32)
Calcium: 9.2 mg/dL (ref 8.9–10.3)
Chloride: 107 mmol/L (ref 98–111)
Creatinine, Ser: 1.23 mg/dL — ABNORMAL HIGH (ref 0.44–1.00)
GFR calc Af Amer: 50 mL/min — ABNORMAL LOW (ref >60)
GFR calc non Af Amer: 43 mL/min — ABNORMAL LOW (ref >60)
Glucose, Bld: 75 mg/dL (ref 70–99)
Potassium: 4 mmol/L (ref 3.5–5.1)
Sodium: 140 mmol/L (ref 135–145)

## 2019-11-11 LAB — GLUCOSE, CAPILLARY
Glucose-Capillary: 105 mg/dL — ABNORMAL HIGH (ref 70–99)
Glucose-Capillary: 107 mg/dL — ABNORMAL HIGH (ref 70–99)
Glucose-Capillary: 139 mg/dL — ABNORMAL HIGH (ref 70–99)
Glucose-Capillary: 147 mg/dL — ABNORMAL HIGH (ref 70–99)
Glucose-Capillary: 179 mg/dL — ABNORMAL HIGH (ref 70–99)

## 2019-11-11 LAB — CBC
HCT: 34.7 % — ABNORMAL LOW (ref 36.0–46.0)
Hemoglobin: 11.7 g/dL — ABNORMAL LOW (ref 12.0–15.0)
MCH: 31.8 pg (ref 26.0–34.0)
MCHC: 33.7 g/dL (ref 30.0–36.0)
MCV: 94.3 fL (ref 80.0–100.0)
Platelets: 245 10*3/uL (ref 150–400)
RBC: 3.68 MIL/uL — ABNORMAL LOW (ref 3.87–5.11)
RDW: 13 % (ref 11.5–15.5)
WBC: 6.1 10*3/uL (ref 4.0–10.5)
nRBC: 0 % (ref 0.0–0.2)

## 2019-11-11 LAB — LIPID PANEL
Cholesterol: 141 mg/dL (ref 0–200)
HDL: 33 mg/dL — ABNORMAL LOW (ref >40)
LDL Cholesterol: 32 mg/dL (ref 0–99)
Total CHOL/HDL Ratio: 4.3 RATIO
Triglycerides: 379 mg/dL — ABNORMAL HIGH (ref <150)
VLDL: 76 mg/dL — ABNORMAL HIGH (ref 0–40)

## 2019-11-11 LAB — HEMOGLOBIN A1C
Hgb A1c MFr Bld: 6.5 % — ABNORMAL HIGH (ref 4.8–5.6)
Mean Plasma Glucose: 139.85 mg/dL

## 2019-11-11 LAB — ECHOCARDIOGRAM COMPLETE
Height: 66 in
Weight: 2116.42 oz

## 2019-11-11 MED ORDER — ASPIRIN EC 81 MG PO TBEC
81.0000 mg | DELAYED_RELEASE_TABLET | Freq: Every day | ORAL | Status: DC
  Administered 2019-11-12 – 2019-11-13 (×2): 81 mg via ORAL
  Filled 2019-11-11 (×2): qty 1

## 2019-11-11 MED ORDER — CLOPIDOGREL BISULFATE 75 MG PO TABS
75.0000 mg | ORAL_TABLET | Freq: Every day | ORAL | Status: DC
  Administered 2019-11-11 – 2019-11-13 (×3): 75 mg via ORAL
  Filled 2019-11-11 (×3): qty 1

## 2019-11-11 NOTE — Progress Notes (Addendum)
Subjective: Aphasia still present   Past Medical History:  Diagnosis Date  . Anemia    past  hx  . Anxiety   . Depression   . Diabetes mellitus without complication ([place])   . Dysrhythmia    prior to PFO correction  . GERD (gastroesophageal reflux disease)   . Headache    before PFO closure  . Hypercholesteremia   . Hypertension   . Hypothyroidism   . 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 PFO (patent foramen ovale)    correction device placed 2010  . Right leg numbness    lower leg- residual from CVA  . Seasonal allergies   . Stroke [facility])    prior to PFO correction    Past Surgical History:  Procedure Laterality Date  . ABDOMINAL HYSTERECTOMY    . CARDIAC CATHETERIZATION     [facility]  . CATARACT EXTRACTION W/PHACO Right 09/01/2015   Procedure: CATARACT EXTRACTION PHACO AND INTRAOCULAR LENS PLACEMENT (IOC);  Surgeon: Leandrew Koyanagi, MD;  Location: [facility];  Service: Ophthalmology;  Laterality: Right;  DIABETIC - oral meds  . CATARACT EXTRACTION W/PHACO Left 10/20/2015   Procedure: CATARACT EXTRACTION PHACO AND INTRAOCULAR LENS PLACEMENT (IOC);  Surgeon: Leandrew Koyanagi, MD;  Location: [facility];  Service: Ophthalmology;  Laterality: Left;  DIABETIC - oral meds  . EP IMPLANTABLE DEVICE N/A 04/04/2016   Procedure: Loop Recorder Insertion;  Surgeon: Deboraha Sprang, MD;  Location: [facility];  Service: Cardiovascular;  Laterality: N/A;  . FOOT NEUROMA SURGERY Left   . NASAL SINUS SURGERY    . PATENT FORAMEN OVALE CLOSURE    . PATENT FORAMEN OVALE CLOSURE    . TEE WITHOUT CARDIOVERSION N/A 02/09/2016   Procedure: TRANSESOPHAGEAL ECHOCARDIOGRAM (TEE);  Surgeon: Adrian Prows, MD;  Location: [facility] ENDOSCOPY;  Service: Cardiovascular;  Laterality: N/A;    Family History  Problem Relation Age of Onset  . Dementia Father   . Breast cancer Sister 43    Social History:  reports that she has been smoking. She has a 55.00 pack-year smoking  history. She has never used smokeless tobacco. She reports that she does not drink alcohol or use drugs.  Allergies  Allergen Reactions  . Alendronate Other (See Comments)  . Penicillins Rash    Also swelling and itching    Medications: I have reviewed the patient's current medications.  ROS:  difficult to obtain due to aphasia.   Physical Examination: Blood pressure (!) 175/59, pulse (!) 55, temperature 97.7 F (36.5 C), temperature source Oral, resp. rate (!) 21, height 5\' 6"  (1.676 m), weight 60 kg, SpO2 99 %.    Neurological Examination   Mental Status: Alert, oriented. Expressive aphasia.   Cranial Nerves: II: Discs flat bilaterally; Visual fields grossly normal, pupils equal, round, reactive to light and accommodation III,IV, VI: ptosis not present, extra-ocular motions intact bilaterally V,VII: smile symmetric, facial light touch sensation normal bilaterally VIII: hearing normal bilaterally XI: bilateral shoulder shrug XII: midline tongue extension Motor: Right : Upper extremity   4+/5    Left:     Upper extremity   4+/5  Lower extremity   4/5     Lower extremity   4/5 Tone and bulk:normal tone throughout; no atrophy noted Sensory: Pinprick and light touch intact throughout, bilaterally Deep Tendon Reflexes: 1+ and symmetric throughout Plantars: Right: downgoing   Left: downgoing Cerebellar: normal finger-to-nose      Laboratory Studies:   Basic Metabolic Panel: Recent  Labs  Lab 11/10/19 0828 11/11/19 0541  NA 138 140  K 4.0 4.0  CL 106 107  CO2 21* 22  GLUCOSE 151* 75  BUN 16 15  CREATININE 1.35* 1.23*  CALCIUM 9.5 9.2    Liver Function Tests: Recent Labs  Lab 11/10/19 0828  AST 18  ALT 13  ALKPHOS 68  BILITOT 0.3  PROT 7.3  ALBUMIN 3.7   No results for input(s): LIPASE, AMYLASE in the last 168 hours. No results for input(s): AMMONIA in the last 168 hours.  CBC: Recent Labs  Lab 11/10/19 0828 11/11/19 0541  WBC 7.5 6.1  NEUTROABS  4.3  --   HGB 12.3 11.7*  HCT 35.3* 34.7*  MCV 91.0 94.3  PLT 240 245    Cardiac Enzymes: No results for input(s): CKTOTAL, CKMB, CKMBINDEX, TROPONINI in the last 168 hours.  BNP: Invalid input(s): POCBNP  CBG: Recent Labs  Lab 11/10/19 1719 11/10/19 2048 11/11/19 0810  GLUCAP 101* 135* 107*    Microbiology: Results for orders placed or performed during the hospital encounter of 11/10/19  SARS CORONAVIRUS 2 (TAT 6-24 HRS) Nasopharyngeal Nasopharyngeal Swab     Status: None   Collection Time: 11/10/19 10:11 AM   Specimen: Nasopharyngeal Swab  Result Value Ref Range Status   SARS Coronavirus 2 NEGATIVE NEGATIVE Final    Comment: (NOTE) SARS-CoV-2 target nucleic acids are NOT DETECTED. The SARS-CoV-2 RNA is generally detectable in upper and lower respiratory specimens during the acute phase of infection. Negative results do not preclude SARS-CoV-2 infection, do not rule out co-infections with other pathogens, and should not be used as the sole basis for treatment or other patient management decisions. Negative results must be combined with clinical observations, patient history, and epidemiological information. The expected result is Negative. Fact Sheet for Patients: SugarRoll.be Fact Sheet for Healthcare Providers: https://www.woods-mathews.com/ This test is not yet approved or cleared by the [place] FDA and  has been authorized for detection and/or diagnosis of SARS-CoV-2 by FDA under an Emergency Use Authorization (EUA). This EUA will remain  in effect (meaning this test can be used) for the duration of the COVID-19 declaration under Section 56 4(b)(1) of the Act, 21 U.S.C. section 360bbb-3(b)(1), unless the authorization is terminated or revoked sooner. Performed at [facility], [place] [address], [place], [place] [address]     Coagulation Studies: Recent Labs    11/10/19 0828  LABPROT 12.4  INR 0.9     Urinalysis: No results for input(s): COLORURINE, LABSPEC, PHURINE, GLUCOSEU, HGBUR, BILIRUBINUR, KETONESUR, PROTEINUR, UROBILINOGEN, NITRITE, LEUKOCYTESUR in the last 168 hours.  Invalid input(s): APPERANCEUR  Lipid Panel:     Component Value Date/Time   CHOL 141 11/11/2019 0541   TRIG 379 (H) 11/11/2019 0541   HDL 33 (L) 11/11/2019 0541   CHOLHDL 4.3 11/11/2019 0541   VLDL 76 (H) 11/11/2019 0541   LDLCALC 32 11/11/2019 0541    HgbA1C:  Lab Results  Component Value Date   HGBA1C 6.4 (H) 02/08/2016    Urine Drug Screen:  No results found for: LABOPIA, COCAINSCRNUR, LABBENZ, AMPHETMU, THCU, LABBARB  Alcohol Level: No results for input(s): ETH in the last 168 hours.  Other results: EKG: normal EKG, normal sinus rhythm, unchanged from previous tracings.  Imaging: CT HEAD WO CONTRAST  Result Date: 11/10/2019 CLINICAL DATA:  Dysarthria EXAM: CT HEAD WITHOUT CONTRAST TECHNIQUE: Contiguous axial images were obtained from the base of the skull through the vertex without intravenous contrast. COMPARISON:  Head CT February 07, 2016; brain MRI February 08, 2016. FINDINGS: Brain: There is mild diffuse atrophy. There is no demonstrable intracranial mass, hemorrhage, extra-axial fluid collection, or midline shift. There is an acute appearing infarct on the left at the left temporal-parietal-occipital junction with localized cytotoxic edema in this area. There are prior infarcts evident in each medial superior occipital lobe as well as in the mid right occipital lobe. There is evidence of a prior infarct involving portions of the head of the caudate nucleus on the right as well as portions of the anterior limbs of the right internal and external capsule. There is a prior infarct at the gray-white junction of the posterior left frontal lobe. There is mild small vessel disease in the centra semiovale bilaterally. There is evidence of a prior small infarct in the left cerebellum along the inferior dentate  nucleus level. Vascular: There is no hyperdense vessel. There is calcification in each carotid siphon region. Skull: The bony calvarium appears intact. Sinuses/Orbits: There is mucosal thickening in several ethmoid air cells. Other visualized paranasal sinuses are clear. Visualized orbits appear symmetric bilaterally. Other: Visualized mastoid air cells are clear. IMPRESSION: 1. Concern for acute infarct at the left temporal-occipital-parietal junction. 2. There is mild atrophy with multiple prior infarcts and periventricular small vessel disease as summarized above. 3.  No demonstrable mass or hemorrhage. 4.  Foci of arterial vascular calcification noted. 5.  There is mucosal thickening in several ethmoid air cells. Electronically Signed   By: Lowella Grip III M.D.   On: 11/10/2019 08:49   US Carotid Bilateral (at [facility] and [facility] only)  Result Date: 11/10/2019 CLINICAL DATA:  Cerebral infarction. History hypertension, hyperlipidemia and coronary artery disease. EXAM: BILATERAL CAROTID DUPLEX ULTRASOUND TECHNIQUE: Pearline Cables scale imaging, color Doppler and duplex ultrasound were performed of bilateral carotid and vertebral arteries in the neck. COMPARISON:  11/19/2007 FINDINGS: Criteria: Quantification of carotid stenosis is based on velocity parameters that correlate the residual internal carotid diameter with NASCET-based stenosis levels, using the diameter of the distal internal carotid lumen as the denominator for stenosis measurement. The following velocity measurements were obtained: RIGHT ICA:  85/20 cm/sec CCA:  [id number] cm/sec SYSTOLIC ICA/CCA RATIO:  1.5 ECA:  76 cm/sec LEFT ICA:  45/11 cm/sec CCA:  [id number] cm/sec SYSTOLIC ICA/CCA RATIO:  1.1 ECA:  68 cm/sec RIGHT CAROTID ARTERY: Intimal thickening in the common carotid artery and carotid bulb. There is a mild amount of noncalcified plaque at the level of the carotid bulb. Minimal partially calcified plaque in the proximal right ICA. No evidence of significant  stenosis with estimated right ICA stenosis of less than 50%. RIGHT VERTEBRAL ARTERY: Antegrade flow with normal waveform and velocity. LEFT CAROTID ARTERY: Mild calcified plaque at the level of the left ICA and left carotid bulb. No significant stenosis identified with estimated left ICA stenosis of less than 50%. LEFT VERTEBRAL ARTERY: Antegrade flow with normal waveform and velocity. IMPRESSION: Mild plaque at the level of both carotid bulbs and proximal internal carotid arteries without significant carotid stenosis identified. Bilateral estimated ICA stenoses of less than 50%. Electronically Signed   By: Aletta Edouard M.D.   On: 11/10/2019 14:34   ECHOCARDIOGRAM COMPLETE  Result Date: 11/11/2019   ECHOCARDIOGRAM REPORT   Patient Name:   [facility] Nelwyn Salisbury Date of Exam: 11/10/2019 Medical Rec #:  [id number]      Height:       66.0 in Accession #:    [id number]     Weight:  132.3 lb Date of Birth:  [birth year]      BSA:          1.68 m Patient Age:    74 years       BP:           137/58 mmHg Patient Gender: F              HR:           50 bpm. Exam Location:  [facility] Procedure: 2D Echo, Cardiac Doppler and Color Doppler Indications:     Stroke 434.91  History:         Patient has prior history of Echocardiogram examinations.                  Stroke; Risk Factors:Hypertension. Septal Repair:Amplazter.                  PFO.  Sonographer:     Alyse Low Roar Referring Phys:  Unknown Foley NIU Diagnosing Phys: Nelva Bush MD IMPRESSIONS  1. Left ventricular ejection fraction, by visual estimation, is 55 to 60%. The left ventricle has normal function. There is mildly increased left ventricular hypertrophy.  2. Elevated left atrial pressure.  3. Left ventricular diastolic parameters are consistent with Grade II diastolic dysfunction (pseudonormalization).  4. The left ventricle has no regional wall motion abnormalities.  5. Global right ventricle has low normal systolic function.The right ventricular size is normal. No  increase in right ventricular wall thickness.  6. Left atrial size was normal.  7. Right atrial size was normal.  8. Mild mitral annular calcification.  9. Moderate calcification of the mitral valve leaflet(s). 10. Moderate thickening of the mitral valve leaflet(s). 11. The mitral valve is degenerative. Mild mitral valve regurgitation. No evidence of mitral stenosis. 12. There is an ill-defined echodensity adjacent to the posterior leaflet on the atrial side of the mitral valve. While this could represent reverberation artifact, there is the possibility of a calcified mass or partially flail posterior leaflet with calcification. This could be further evaluated by TEE, as clinically indicated. 13. The tricuspid valve is normal in structure. 14. The aortic valve was not well visualized. Aortic valve regurgitation is not visualized. No evidence of aortic valve sclerosis or stenosis. 15. The pulmonic valve was normal in structure. Pulmonic valve regurgitation is trivial. 16. Mildly dilated pulmonary artery. 17. TR signal is inadequate for assessing pulmonary artery systolic pressure. 18. The inferior vena cava is normal in size with greater than 50% respiratory variability, suggesting right atrial pressure of 3 mmHg. 19. The interatrial septum was not well visualized. FINDINGS  Left Ventricle: Left ventricular ejection fraction, by visual estimation, is 55 to 60%. The left ventricle has normal function. The left ventricle has no regional wall motion abnormalities. The left ventricular internal cavity size was the left ventricle is normal in size. There is mildly increased left ventricular hypertrophy. Left ventricular diastolic parameters are consistent with Grade II diastolic dysfunction (pseudonormalization). Elevated left atrial pressure. Right Ventricle: The right ventricular size is normal. No increase in right ventricular wall thickness. Global RV systolic function is has low normal systolic function. Left Atrium:  Left atrial size was normal in size. Right Atrium: Right atrial size was normal in size Pericardium: There is no evidence of pericardial effusion. Mitral Valve: The mitral valve is degenerative in appearance. There is moderate thickening of the mitral valve leaflet(s). There is moderate calcification of the mitral valve leaflet(s). Mild mitral annular calcification. Mild mitral valve regurgitation.  No evidence of mitral valve stenosis by observation. There is an ill-defined echodensity adjacent to the posterior leaflet on the atrial side of the mitral valve. While this could represent reverberation artifact, there is the possibility of a calcified  mass or partially flail posterior leaflet with calcification. This could be further evaluated by TEE, as clinically indicated. Tricuspid Valve: The tricuspid valve is normal in structure. Tricuspid valve regurgitation is trivial. Aortic Valve: The aortic valve was not well visualized. Aortic valve regurgitation is not visualized. The aortic valve is structurally normal, with no evidence of sclerosis or stenosis. Aortic valve mean gradient measures 3.0 mmHg. Aortic valve peak gradient measures 5.5 mmHg. Aortic valve area, by VTI measures 2.34 cm. Pulmonic Valve: The pulmonic valve was normal in structure. Pulmonic valve regurgitation is trivial. Pulmonic regurgitation is trivial. No evidence of pulmonic stenosis. Aorta: The aortic root is normal in size and structure. Pulmonary Artery: The pulmonary artery is mildly dilated. Venous: The inferior vena cava is normal in size with greater than 50% respiratory variability, suggesting right atrial pressure of 3 mmHg. IAS/Shunts: The interatrial septum was not well visualized.  LEFT VENTRICLE PLAX 2D LVIDd:         4.83 cm  Diastology LVIDs:         3.53 cm  LV e' lateral:   8.05 cm/s LV PW:         1.07 cm  LV E/e' lateral: 15.7 LV IVS:        1.26 cm  LV e' medial:    4.46 cm/s LVOT diam:     1.90 cm  LV E/e' medial:  28.3  LV SV:         57 ml LV SV Index:   34.21 LVOT Area:     2.84 cm  RIGHT VENTRICLE RV Basal diam:  2.84 cm RV Mid diam:    2.87 cm RV S prime:     11.00 cm/s LEFT ATRIUM             Index       RIGHT ATRIUM           Index LA diam:        4.00 cm 2.38 cm/m  RA Area:     11.30 cm LA Vol (A2C):   49.7 ml 29.63 ml/m RA Volume:   23.10 ml  13.77 ml/m LA Vol (A4C):   57.0 ml 33.98 ml/m LA Biplane Vol: 52.6 ml 31.35 ml/m  AORTIC VALVE                   PULMONIC VALVE AV Area (Vmax):    2.14 cm    PV Vmax:        1.09 m/s AV Area (Vmean):   2.29 cm    PV Peak grad:   4.8 mmHg AV Area (VTI):     2.34 cm    RVOT Peak grad: 4 mmHg AV Vmax:           117.00 cm/s AV Vmean:          75.300 cm/s AV VTI:            0.290 m AV Peak Grad:      5.5 mmHg AV Mean Grad:      3.0 mmHg LVOT Vmax:         88.30 cm/s LVOT Vmean:        60.700 cm/s LVOT VTI:          0.239  m LVOT/AV VTI ratio: 0.82  AORTA Ao Root diam: 2.70 cm MITRAL VALVE MV Area (PHT): 3.60 cm              SHUNTS MV PHT:        61.19 msec            Systemic VTI:  0.24 m MV Decel Time: 211 msec              Systemic Diam: 1.90 cm MV E velocity: 126.00 cm/s 103 cm/s MV A velocity: 107.00 cm/s 70.3 cm/s MV E/A ratio:  1.18        1.5  Nelva Bush MD Electronically signed by Nelva Bush MD Signature Date/Time: 11/11/2019/11:06:02 AM    Final      Assessment/Plan:   74 y.o. female with a history of diabetes, stroke hypertension, who presents with complaints of difficulty speaking since Saturday. No other clear focality on examination. Not TPA/CTA candidate as was outside the window.  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   Patient has a loop recorder. Was on ASA 81mg  prior to admission  - Obtaining MRI/MRA h/n now - ASA 325 ordered and increased  -pt/ot/speech  - s/p discussion with husband at bedside - needs speech therapy, pt/ot  Addendum: MRI with L temporal stroke as seen on [facility] Intracranial stenosis Agree with  change to dual anti platelet therapy as intracranial stenosis Anticoagulation can be started in 5 days or so due to size of stroke and likely ASA 81mg  along with full anticoagulation     11/11/2019, 11:17 AM

## 2019-11-11 NOTE — Progress Notes (Signed)
PROGRESS NOTE    Erin Yates  [id number] DOB: November 30, 1945 DOA: 11/10/2019 PCP: Adin Hector, MD       Assessment & Plan:   Principal Problem:   Stroke [facility]) Active Problems:   Depression   GERD (gastroesophageal reflux disease)   Hypertension   Hypothyroidism   Hypercholesteremia   Tobacco abuse   Type II diabetes mellitus with renal manifestations ([place])   CVA (cerebral vascular accident) ([place])   CVA: acute/subacute nonhemorrhagic infarct involving the left parietal & high-grade stenosis of the proximal left A1 segment & moderate stenosis of the proximal right P2 segment as per MRI/MRA brain.. Continue w/ neuro checks. Echo showing EF 55-60%, grade II diastolic dysfunction & there is an ill-defined echodensity adjacent to the posterior leaflet on the atrial side of the mitral valve. While this could represent reverberation artifact, there is the possibility of a calcified mass or partially flail posterior leaflet with calcification. Cardio consulted for possible TEE to better evaluate the mitral valve. Continue on aspirin and statin. PT/OT consulted. Neuro following and recs apprec  Depression: severity unknown. No SI or HI. Continue on home dose of bupropion  GERD: continue on PPI  HTN: continue on home doses of amlodipine, losartan, metoprolol. Continue to hold home dose of HCTZ  Hypothyroidism: continue on home dose of synthroid  Hypercholesteremia: continue on statin   Tobacco abuse: nicotine patch to prevent w/drawal. Smoking cessation counseling   DM2: Last A1c 6.4, well controled. Continue to hold home dose of metformin. Continue on SSI w/ accuchecks   AKI: baseline Cr is unknown. Cr is trending down today. Continue on IVFs. Will continue to monitor     DVT prophylaxis: lovenox Code Status: full  Family Communication:  Disposition Plan:    Consultants:   neuro   Procedures:   Antimicrobials: n/a  Subjective: Pt has difficulty  word-finding and speaking. Pt denies any pain   Objective: Vitals:   11/11/19 0236 11/11/19 0550 11/11/19 0809 11/11/19 1019  BP: (!) 153/58 (!) 158/66 (!) 172/67 (!) 175/59  Pulse: (!) 55 (!) 54 (!) 53 (!) 55  Resp: 16 16 (!) 21   Temp: 97.7 F (36.5 C) 97.9 F (36.6 C) 97.7 F (36.5 C)   TempSrc: Oral Oral Oral   SpO2: 96% 93% 99%   Weight:      Height:       No intake or output data in the 24 hours ending 11/11/19 1347 Filed Weights   11/10/19 0823  Weight: 60 kg    Examination:  General exam: Appears calm and comfortable  Respiratory system: Clear to auscultation. Respiratory effort normal. Cardiovascular system: S1 & S2 +. No rubs, gallops or clicks.  Gastrointestinal system: Abdomen is nondistended, soft and nontender. Normal bowel sounds heard. Central nervous system: Alert and oriented. Decreased strength of b/l UE and b/l LE  Psychiatry: Judgement and insight appear normal. Flat mood and affect    Data Reviewed: I have personally reviewed following labs and imaging studies  CBC: Recent Labs  Lab 11/10/19 0828 11/11/19 0541  WBC 7.5 6.1  NEUTROABS 4.3  --   HGB 12.3 11.7*  HCT 35.3* 34.7*  MCV 91.0 94.3  PLT 240 [id number]   Basic Metabolic Panel: Recent Labs  Lab 11/10/19 0828 11/11/19 0541  NA 138 140  K 4.0 4.0  CL 106 107  CO2 21* 22  GLUCOSE 151* 75  BUN 16 15  CREATININE 1.35* 1.23*  CALCIUM 9.5 9.2   GFR:  Estimated Creatinine Clearance: 38.1 mL/min (A) (by C-G formula based on SCr of 1.23 mg/dL (H)). Liver Function Tests: Recent Labs  Lab 11/10/19 0828  AST 18  ALT 13  ALKPHOS 68  BILITOT 0.3  PROT 7.3  ALBUMIN 3.7   No results for input(s): LIPASE, AMYLASE in the last 168 hours. No results for input(s): AMMONIA in the last 168 hours. Coagulation Profile: Recent Labs  Lab 11/10/19 0828  INR 0.9   Cardiac Enzymes: No results for input(s): CKTOTAL, CKMB, CKMBINDEX, TROPONINI in the last 168 hours. BNP (last 3 results) No  results for input(s): PROBNP in the last 8760 hours. HbA1C: Recent Labs    11/10/19 0828  HGBA1C 6.5*   CBG: Recent Labs  Lab 11/10/19 1719 11/10/19 2048 11/11/19 0810 11/11/19 1156  GLUCAP 101* 135* 107* 179*   Lipid Profile: Recent Labs    11/11/19 0541  CHOL 141  HDL 33*  LDLCALC 32  TRIG 379*  CHOLHDL 4.3   Thyroid Function Tests: No results for input(s): TSH, T4TOTAL, FREET4, T3FREE, THYROIDAB in the last 72 hours. Anemia Panel: No results for input(s): VITAMINB12, FOLATE, FERRITIN, TIBC, IRON, RETICCTPCT in the last 72 hours. Sepsis Labs: No results for input(s): PROCALCITON, LATICACIDVEN in the last 168 hours.  Recent Results (from the past 240 hour(s))  SARS CORONAVIRUS 2 (TAT 6-24 HRS) Nasopharyngeal Nasopharyngeal Swab     Status: None   Collection Time: 11/10/19 10:11 AM   Specimen: Nasopharyngeal Swab  Result Value Ref Range Status   SARS Coronavirus 2 NEGATIVE NEGATIVE Final    Comment: (NOTE) SARS-CoV-2 target nucleic acids are NOT DETECTED. The SARS-CoV-2 RNA is generally detectable in upper and lower respiratory specimens during the acute phase of infection. Negative results do not preclude SARS-CoV-2 infection, do not rule out co-infections with other pathogens, and should not be used as the sole basis for treatment or other patient management decisions. Negative results must be combined with clinical observations, patient history, and epidemiological information. The expected result is Negative. Fact Sheet for Patients: SugarRoll.be Fact Sheet for Healthcare Providers: https://www.woods-mathews.com/ This test is not yet approved or cleared by the [place] FDA and  has been authorized for detection and/or diagnosis of SARS-CoV-2 by FDA under an Emergency Use Authorization (EUA). This EUA will remain  in effect (meaning this test can be used) for the duration of the COVID-19 declaration under Section  56 4(b)(1) of the Act, 21 U.S.C. section 360bbb-3(b)(1), unless the authorization is terminated or revoked sooner. Performed at [facility], [place] [address] [place], [place] [address]          Radiology Studies: CT HEAD WO CONTRAST  Result Date: 11/10/2019 CLINICAL DATA:  Dysarthria EXAM: CT HEAD WITHOUT CONTRAST TECHNIQUE: Contiguous axial images were obtained from the base of the skull through the vertex without intravenous contrast. COMPARISON:  Head CT February 07, 2016; brain MRI February 08, 2016. FINDINGS: Brain: There is mild diffuse atrophy. There is no demonstrable intracranial mass, hemorrhage, extra-axial fluid collection, or midline shift. There is an acute appearing infarct on the left at the left temporal-parietal-occipital junction with localized cytotoxic edema in this area. There are prior infarcts evident in each medial superior occipital lobe as well as in the mid right occipital lobe. There is evidence of a prior infarct involving portions of the head of the caudate nucleus on the right as well as portions of the anterior limbs of the right internal and external capsule. There is a prior infarct at  the gray-white junction of the posterior left frontal lobe. There is mild small vessel disease in the centra semiovale bilaterally. There is evidence of a prior small infarct in the left cerebellum along the inferior dentate nucleus level. Vascular: There is no hyperdense vessel. There is calcification in each carotid siphon region. Skull: The bony calvarium appears intact. Sinuses/Orbits: There is mucosal thickening in several ethmoid air cells. Other visualized paranasal sinuses are clear. Visualized orbits appear symmetric bilaterally. Other: Visualized mastoid air cells are clear. IMPRESSION: 1. Concern for acute infarct at the left temporal-occipital-parietal junction. 2. There is mild atrophy with multiple prior infarcts and periventricular small vessel disease as summarized above.  3.  No demonstrable mass or hemorrhage. 4.  Foci of arterial vascular calcification noted. 5.  There is mucosal thickening in several ethmoid air cells. Electronically Signed   By: Lowella Grip III M.D.   On: 11/10/2019 08:49   MR ANGIO HEAD WO CONTRAST  Result Date: 11/11/2019 CLINICAL DATA:  Abnormal speech beginning 3 days ago. Dysarthria. EXAM: MRI HEAD WITHOUT CONTRAST MRA HEAD WITHOUT CONTRAST MRA NECK WITHOUT CONTRAST TECHNIQUE: Multiplanar, multiecho pulse sequences of the brain and surrounding structures were obtained without intravenous contrast. Angiographic images of the Circle of Willis were obtained using MRA technique without intravenous contrast. Angiographic images of the neck were obtained using MRA technique without intravenous contrast. Carotid stenosis measurements (when applicable) are obtained utilizing NASCET criteria, using the distal internal carotid diameter as the denominator. COMPARISON:  None. FINDINGS: MRI HEAD FINDINGS Brain: The diffusion-weighted images confirm an acute/subacute nonhemorrhagic infarct involving the left parietal lobe. Remote infarcts with volume loss are present in the occipital poles bilaterally. A smaller remote infarct is present in the right parietal lobe. A remote cortical infarct is present in the left frontal operculum. Remote lacunar infarcts are present in the right basal ganglia. Mild periventricular white matter changes are present bilaterally. The ventricles are of normal size. No significant extraaxial fluid collection is present. White matter changes extend into the brainstem. Remote nonhemorrhagic lacunar infarcts are present in the cerebellum bilaterally. Vascular: Flow is present in the major intracranial arteries. Skull and upper cervical spine: The craniocervical junction is normal. Upper cervical spine is within normal limits. Marrow signal is unremarkable. Sinuses/Orbits: A polyp or mucous retention cyst is present in the left sphenoid  sinus. Left maxillary antrostomy is noted. Left ethmoidectomies are noted. No other significant mucosal disease is present in the paranasal sinuses. Minimal fluid is present in the mastoid air cells bilaterally. No obstructing nasopharyngeal lesion is present. Bilateral lens replacements are noted. Globes and orbits are otherwise unremarkable. MRA HEAD FINDINGS There is significant signal loss just below the skull base in the internal carotid arteries bilaterally. This is likely artifactual. Mild atherosclerotic changes are present through the cavernous internal carotid arteries bilaterally. There is no significant stenosis. ICA termini are normal. High-grade stenosis is present in the proximal left A1 segment. Right A1 segment is small. M1 segments are normal bilaterally. MCA bifurcations are within normal limits. There is some attenuation of distal MCA branch vessels without a significant proximal stenosis or occlusion. The left vertebral artery is slightly dominant to the right. PICA origins are visualized and normal. Basilar artery is normal. Both posterior cerebral arteries originate from basilar tip. Moderate stenosis is present in the proximal right P2 segment. There is some attenuation of distal PCA branch vessels bilaterally. MRA NECK FINDINGS There is a common origin of the left common carotid artery in the innominate artery.  No significant flow disturbance is present at either carotid bifurcation. Flow is antegrade in the vertebral arteries bilaterally without significant stenosis IMPRESSION: 1. Acute/subacute nonhemorrhagic infarct involving the left parietal lobe. 2. Remote infarcts involving the occipital poles bilaterally, the left frontal operculum, and cerebellum. 3. Mild periventricular white matter disease bilaterally likely reflects the sequela of chronic microvascular ischemia. 4. MRA Circle of Willis demonstrates no significant proximal stenosis, aneurysm, or branch vessel occlusion. 5.  High-grade stenosis of the proximal left A1 segment. 6. Moderate stenosis of the proximal right P2 segment. 7. Mild atherosclerotic changes at the cavernous internal carotid arteries bilaterally without other significant stenoses. 8. Signal loss in the internal carotid arteries bilaterally just below the skull base is likely artifactual. Electronically Signed   By: San Morelle M.D.   On: 11/11/2019 12:17   MR ANGIO NECK WO CONTRAST  Result Date: 11/11/2019 CLINICAL DATA:  Abnormal speech beginning 3 days ago. Dysarthria. EXAM: MRI HEAD WITHOUT CONTRAST MRA HEAD WITHOUT CONTRAST MRA NECK WITHOUT CONTRAST TECHNIQUE: Multiplanar, multiecho pulse sequences of the brain and surrounding structures were obtained without intravenous contrast. Angiographic images of the Circle of Willis were obtained using MRA technique without intravenous contrast. Angiographic images of the neck were obtained using MRA technique without intravenous contrast. Carotid stenosis measurements (when applicable) are obtained utilizing NASCET criteria, using the distal internal carotid diameter as the denominator. COMPARISON:  None. FINDINGS: MRI HEAD FINDINGS Brain: The diffusion-weighted images confirm an acute/subacute nonhemorrhagic infarct involving the left parietal lobe. Remote infarcts with volume loss are present in the occipital poles bilaterally. A smaller remote infarct is present in the right parietal lobe. A remote cortical infarct is present in the left frontal operculum. Remote lacunar infarcts are present in the right basal ganglia. Mild periventricular white matter changes are present bilaterally. The ventricles are of normal size. No significant extraaxial fluid collection is present. White matter changes extend into the brainstem. Remote nonhemorrhagic lacunar infarcts are present in the cerebellum bilaterally. Vascular: Flow is present in the major intracranial arteries. Skull and upper cervical spine: The  craniocervical junction is normal. Upper cervical spine is within normal limits. Marrow signal is unremarkable. Sinuses/Orbits: A polyp or mucous retention cyst is present in the left sphenoid sinus. Left maxillary antrostomy is noted. Left ethmoidectomies are noted. No other significant mucosal disease is present in the paranasal sinuses. Minimal fluid is present in the mastoid air cells bilaterally. No obstructing nasopharyngeal lesion is present. Bilateral lens replacements are noted. Globes and orbits are otherwise unremarkable. MRA HEAD FINDINGS There is significant signal loss just below the skull base in the internal carotid arteries bilaterally. This is likely artifactual. Mild atherosclerotic changes are present through the cavernous internal carotid arteries bilaterally. There is no significant stenosis. ICA termini are normal. High-grade stenosis is present in the proximal left A1 segment. Right A1 segment is small. M1 segments are normal bilaterally. MCA bifurcations are within normal limits. There is some attenuation of distal MCA branch vessels without a significant proximal stenosis or occlusion. The left vertebral artery is slightly dominant to the right. PICA origins are visualized and normal. Basilar artery is normal. Both posterior cerebral arteries originate from basilar tip. Moderate stenosis is present in the proximal right P2 segment. There is some attenuation of distal PCA branch vessels bilaterally. MRA NECK FINDINGS There is a common origin of the left common carotid artery in the innominate artery. No significant flow disturbance is present at either carotid bifurcation. Flow is antegrade in the vertebral  arteries bilaterally without significant stenosis IMPRESSION: 1. Acute/subacute nonhemorrhagic infarct involving the left parietal lobe. 2. Remote infarcts involving the occipital poles bilaterally, the left frontal operculum, and cerebellum. 3. Mild periventricular white matter disease  bilaterally likely reflects the sequela of chronic microvascular ischemia. 4. MRA Circle of Willis demonstrates no significant proximal stenosis, aneurysm, or branch vessel occlusion. 5. High-grade stenosis of the proximal left A1 segment. 6. Moderate stenosis of the proximal right P2 segment. 7. Mild atherosclerotic changes at the cavernous internal carotid arteries bilaterally without other significant stenoses. 8. Signal loss in the internal carotid arteries bilaterally just below the skull base is likely artifactual. Electronically Signed   By: San Morelle M.D.   On: 11/11/2019 12:17   MR BRAIN WO CONTRAST  Result Date: 11/11/2019 CLINICAL DATA:  Abnormal speech beginning 3 days ago. Dysarthria. EXAM: MRI HEAD WITHOUT CONTRAST MRA HEAD WITHOUT CONTRAST MRA NECK WITHOUT CONTRAST TECHNIQUE: Multiplanar, multiecho pulse sequences of the brain and surrounding structures were obtained without intravenous contrast. Angiographic images of the Circle of Willis were obtained using MRA technique without intravenous contrast. Angiographic images of the neck were obtained using MRA technique without intravenous contrast. Carotid stenosis measurements (when applicable) are obtained utilizing NASCET criteria, using the distal internal carotid diameter as the denominator. COMPARISON:  None. FINDINGS: MRI HEAD FINDINGS Brain: The diffusion-weighted images confirm an acute/subacute nonhemorrhagic infarct involving the left parietal lobe. Remote infarcts with volume loss are present in the occipital poles bilaterally. A smaller remote infarct is present in the right parietal lobe. A remote cortical infarct is present in the left frontal operculum. Remote lacunar infarcts are present in the right basal ganglia. Mild periventricular white matter changes are present bilaterally. The ventricles are of normal size. No significant extraaxial fluid collection is present. White matter changes extend into the brainstem. Remote  nonhemorrhagic lacunar infarcts are present in the cerebellum bilaterally. Vascular: Flow is present in the major intracranial arteries. Skull and upper cervical spine: The craniocervical junction is normal. Upper cervical spine is within normal limits. Marrow signal is unremarkable. Sinuses/Orbits: A polyp or mucous retention cyst is present in the left sphenoid sinus. Left maxillary antrostomy is noted. Left ethmoidectomies are noted. No other significant mucosal disease is present in the paranasal sinuses. Minimal fluid is present in the mastoid air cells bilaterally. No obstructing nasopharyngeal lesion is present. Bilateral lens replacements are noted. Globes and orbits are otherwise unremarkable. MRA HEAD FINDINGS There is significant signal loss just below the skull base in the internal carotid arteries bilaterally. This is likely artifactual. Mild atherosclerotic changes are present through the cavernous internal carotid arteries bilaterally. There is no significant stenosis. ICA termini are normal. High-grade stenosis is present in the proximal left A1 segment. Right A1 segment is small. M1 segments are normal bilaterally. MCA bifurcations are within normal limits. There is some attenuation of distal MCA branch vessels without a significant proximal stenosis or occlusion. The left vertebral artery is slightly dominant to the right. PICA origins are visualized and normal. Basilar artery is normal. Both posterior cerebral arteries originate from basilar tip. Moderate stenosis is present in the proximal right P2 segment. There is some attenuation of distal PCA branch vessels bilaterally. MRA NECK FINDINGS There is a common origin of the left common carotid artery in the innominate artery. No significant flow disturbance is present at either carotid bifurcation. Flow is antegrade in the vertebral arteries bilaterally without significant stenosis IMPRESSION: 1. Acute/subacute nonhemorrhagic infarct involving the  left parietal lobe. 2.  Remote infarcts involving the occipital poles bilaterally, the left frontal operculum, and cerebellum. 3. Mild periventricular white matter disease bilaterally likely reflects the sequela of chronic microvascular ischemia. 4. MRA Circle of Willis demonstrates no significant proximal stenosis, aneurysm, or branch vessel occlusion. 5. High-grade stenosis of the proximal left A1 segment. 6. Moderate stenosis of the proximal right P2 segment. 7. Mild atherosclerotic changes at the cavernous internal carotid arteries bilaterally without other significant stenoses. 8. Signal loss in the internal carotid arteries bilaterally just below the skull base is likely artifactual. Electronically Signed   By: San Morelle M.D.   On: 11/11/2019 12:17   US Carotid Bilateral (at [facility] and [facility] only)  Result Date: 11/10/2019 CLINICAL DATA:  Cerebral infarction. History hypertension, hyperlipidemia and coronary artery disease. EXAM: BILATERAL CAROTID DUPLEX ULTRASOUND TECHNIQUE: Pearline Cables scale imaging, color Doppler and duplex ultrasound were performed of bilateral carotid and vertebral arteries in the neck. COMPARISON:  11/19/2007 FINDINGS: Criteria: Quantification of carotid stenosis is based on velocity parameters that correlate the residual internal carotid diameter with NASCET-based stenosis levels, using the diameter of the distal internal carotid lumen as the denominator for stenosis measurement. The following velocity measurements were obtained: RIGHT ICA:  85/20 cm/sec CCA:  [id number] cm/sec SYSTOLIC ICA/CCA RATIO:  1.5 ECA:  76 cm/sec LEFT ICA:  45/11 cm/sec CCA:  [id number] cm/sec SYSTOLIC ICA/CCA RATIO:  1.1 ECA:  68 cm/sec RIGHT CAROTID ARTERY: Intimal thickening in the common carotid artery and carotid bulb. There is a mild amount of noncalcified plaque at the level of the carotid bulb. Minimal partially calcified plaque in the proximal right ICA. No evidence of significant stenosis with estimated right  ICA stenosis of less than 50%. RIGHT VERTEBRAL ARTERY: Antegrade flow with normal waveform and velocity. LEFT CAROTID ARTERY: Mild calcified plaque at the level of the left ICA and left carotid bulb. No significant stenosis identified with estimated left ICA stenosis of less than 50%. LEFT VERTEBRAL ARTERY: Antegrade flow with normal waveform and velocity. IMPRESSION: Mild plaque at the level of both carotid bulbs and proximal internal carotid arteries without significant carotid stenosis identified. Bilateral estimated ICA stenoses of less than 50%. Electronically Signed   By: Aletta Edouard M.D.   On: 11/10/2019 14:34   ECHOCARDIOGRAM COMPLETE  Result Date: 11/11/2019   ECHOCARDIOGRAM REPORT   Patient Name:   Erin Yates Date of Exam: 11/10/2019 Medical Rec #:  [id number]      Height:       66.0 in Accession #:    [id number]     Weight:       132.3 lb Date of Birth:  1946-05-03      BSA:          1.68 m Patient Age:    74 years       BP:           137/58 mmHg Patient Gender: F              HR:           50 bpm. Exam Location:  [facility] Procedure: 2D Echo, Cardiac Doppler and Color Doppler Indications:     Stroke 434.91  History:         Patient has prior history of Echocardiogram examinations.                  Stroke; Risk Factors:Hypertension. Septal Repair:Amplazter.  PFO.  Sonographer:     Alyse Low Roar Referring Phys:  [id number] Soledad Gerlach NIU Diagnosing Phys: Nelva Bush MD IMPRESSIONS  1. Left ventricular ejection fraction, by visual estimation, is 55 to 60%. The left ventricle has normal function. There is mildly increased left ventricular hypertrophy.  2. Elevated left atrial pressure.  3. Left ventricular diastolic parameters are consistent with Grade II diastolic dysfunction (pseudonormalization).  4. The left ventricle has no regional wall motion abnormalities.  5. Global right ventricle has low normal systolic function.The right ventricular size is normal. No increase in right ventricular wall  thickness.  6. Left atrial size was normal.  7. Right atrial size was normal.  8. Mild mitral annular calcification.  9. Moderate calcification of the mitral valve leaflet(s). 10. Moderate thickening of the mitral valve leaflet(s). 11. The mitral valve is degenerative. Mild mitral valve regurgitation. No evidence of mitral stenosis. 12. There is an ill-defined echodensity adjacent to the posterior leaflet on the atrial side of the mitral valve. While this could represent reverberation artifact, there is the possibility of a calcified mass or partially flail posterior leaflet with calcification. This could be further evaluated by TEE, as clinically indicated. 13. The tricuspid valve is normal in structure. 14. The aortic valve was not well visualized. Aortic valve regurgitation is not visualized. No evidence of aortic valve sclerosis or stenosis. 15. The pulmonic valve was normal in structure. Pulmonic valve regurgitation is trivial. 16. Mildly dilated pulmonary artery. 17. TR signal is inadequate for assessing pulmonary artery systolic pressure. 18. The inferior vena cava is normal in size with greater than 50% respiratory variability, suggesting right atrial pressure of 3 mmHg. 19. The interatrial septum was not well visualized. FINDINGS  Left Ventricle: Left ventricular ejection fraction, by visual estimation, is 55 to 60%. The left ventricle has normal function. The left ventricle has no regional wall motion abnormalities. The left ventricular internal cavity size was the left ventricle is normal in size. There is mildly increased left ventricular hypertrophy. Left ventricular diastolic parameters are consistent with Grade II diastolic dysfunction (pseudonormalization). Elevated left atrial pressure. Right Ventricle: The right ventricular size is normal. No increase in right ventricular wall thickness. Global RV systolic function is has low normal systolic function. Left Atrium: Left atrial size was normal in  size. Right Atrium: Right atrial size was normal in size Pericardium: There is no evidence of pericardial effusion. Mitral Valve: The mitral valve is degenerative in appearance. There is moderate thickening of the mitral valve leaflet(s). There is moderate calcification of the mitral valve leaflet(s). Mild mitral annular calcification. Mild mitral valve regurgitation.  No evidence of mitral valve stenosis by observation. There is an ill-defined echodensity adjacent to the posterior leaflet on the atrial side of the mitral valve. While this could represent reverberation artifact, there is the possibility of a calcified  mass or partially flail posterior leaflet with calcification. This could be further evaluated by TEE, as clinically indicated. Tricuspid Valve: The tricuspid valve is normal in structure. Tricuspid valve regurgitation is trivial. Aortic Valve: The aortic valve was not well visualized. Aortic valve regurgitation is not visualized. The aortic valve is structurally normal, with no evidence of sclerosis or stenosis. Aortic valve mean gradient measures 3.0 mmHg. Aortic valve peak gradient measures 5.5 mmHg. Aortic valve area, by VTI measures 2.34 cm. Pulmonic Valve: The pulmonic valve was normal in structure. Pulmonic valve regurgitation is trivial. Pulmonic regurgitation is trivial. No evidence of pulmonic stenosis. Aorta: The aortic root is normal in size  and structure. Pulmonary Artery: The pulmonary artery is mildly dilated. Venous: The inferior vena cava is normal in size with greater than 50% respiratory variability, suggesting right atrial pressure of 3 mmHg. IAS/Shunts: The interatrial septum was not well visualized.  LEFT VENTRICLE PLAX 2D LVIDd:         4.83 cm  Diastology LVIDs:         3.53 cm  LV e' lateral:   8.05 cm/s LV PW:         1.07 cm  LV E/e' lateral: 15.7 LV IVS:        1.26 cm  LV e' medial:    4.46 cm/s LVOT diam:     1.90 cm  LV E/e' medial:  28.3 LV SV:         57 ml LV SV  Index:   34.21 LVOT Area:     2.84 cm  RIGHT VENTRICLE RV Basal diam:  2.84 cm RV Mid diam:    2.87 cm RV S prime:     11.00 cm/s LEFT ATRIUM             Index       RIGHT ATRIUM           Index LA diam:        4.00 cm 2.38 cm/m  RA Area:     11.30 cm LA Vol (A2C):   49.7 ml 29.63 ml/m RA Volume:   23.10 ml  13.77 ml/m LA Vol (A4C):   57.0 ml 33.98 ml/m LA Biplane Vol: 52.6 ml 31.35 ml/m  AORTIC VALVE                   PULMONIC VALVE AV Area (Vmax):    2.14 cm    PV Vmax:        1.09 m/s AV Area (Vmean):   2.29 cm    PV Peak grad:   4.8 mmHg AV Area (VTI):     2.34 cm    RVOT Peak grad: 4 mmHg AV Vmax:           117.00 cm/s AV Vmean:          75.300 cm/s AV VTI:            0.290 m AV Peak Grad:      5.5 mmHg AV Mean Grad:      3.0 mmHg LVOT Vmax:         88.30 cm/s LVOT Vmean:        60.700 cm/s LVOT VTI:          0.239 m LVOT/AV VTI ratio: 0.82  AORTA Ao Root diam: 2.70 cm MITRAL VALVE MV Area (PHT): 3.60 cm              SHUNTS MV PHT:        61.19 msec            Systemic VTI:  0.24 m MV Decel Time: 211 msec              Systemic Diam: 1.90 cm MV E velocity: 126.00 cm/s 103 cm/s MV A velocity: 107.00 cm/s 70.3 cm/s MV E/A ratio:  1.18        1.5  Nelva Bush MD Electronically signed by Nelva Bush MD Signature Date/Time: 11/11/2019/11:06:02 AM    Final         Scheduled Meds:  amLODipine  10 mg Oral Daily   aspirin  300 mg Rectal Daily  Or   aspirin  325 mg Oral Daily   buPROPion  150 mg Oral BID   cholecalciferol  1,000 Units Oral Daily   enoxaparin (LOVENOX) injection  40 mg Subcutaneous Q24H   insulin aspart  0-5 Units Subcutaneous QHS   insulin aspart  0-9 Units Subcutaneous TID WC   levothyroxine  50 mcg Oral Q0600   losartan  100 mg Oral Daily   metoprolol succinate  200 mg Oral Daily   nicotine  21 mg Transdermal Daily   pantoprazole  40 mg Oral BID   traZODone  50 mg Oral QHS   venlafaxine XR  225 mg Oral Q breakfast   vitamin B-12  500 mcg Oral  Daily   Continuous Infusions:  sodium chloride       LOS: 0 days    Time spent: 33 mins    Wyvonnia Dusky, MD [facility] Pager [phone]  If 7PM-7AM, please contact night-coverage [email or web address] Password [email or web address] 11/11/2019, 1:47 PM

## 2019-11-11 NOTE — Consult Note (Signed)
Cardiology Consultation:   Patient ID: Erin Yates; [id number]; [birth year]   Admit date: 11/10/2019 Date of Consult: 11/11/2019  Primary Care Provider: Adin Hector, MD Primary Cardiologist: Gaynelle Cage Electrophysiologist:  [name]   Patient Profile:   Erin Yates is a 74 y.o. female with a hx of paroxysmal A. fib not compliant with recommended Eliquis in the setting of financial constraints, recurrent/cryptogenic stroke, PFO status post Amplatzer closure in 05/2009 at [facility], CKD stage III, DM, HTN, HLD, hypothyroidism, anxiety, depression, ongoing tobacco use, and GERD who is being seen today for the evaluation of abnormal echo at the request of Dr. Jimmye Norman.  History of Present Illness:   Erin Yates has history of multiple strokes, at least 6 and in the setting has undergone remote PFO closure in 05/2009 as well as ILR implant in 03/2016.  Prior TEE in 02/2016 demonstrated well-positioned PFO closure device.  Following ILR implantation A. fib was identified on 06/09/2016 and patient was started on Eliquis at that time.  Patient self discontinued this after several months because of cost.  More recently, she was noted to have increased A. fib burden on her Linq device and was seen in the A. fib clinic on 01/01/2019 with recommendation to resume Eliquis.  She did not start this secondary to cost.  When she was seen by electrophysiology on 03/11/2019 it was again recommended she discontinue aspirin and start Eliquis given increased A. fib burden of greater than 12 hours; however she continued aspirin without starting Eliquis, again secondary to cost.  Patient has never explored financial assistance for DOAC.  Patient was admitted to [facility] on 11/10/2019 with difficulty speaking for 2 days which seemed to initially briefly improve though subsequently worsened and became persistent.  She was not given TPA secondary to outside window.  CT head showed concern for acute infarct at the left temporal  occipital parietal junction without demonstratable mass or hemorrhage.  Carotid artery ultrasound showed mild plaque at the level of both carotid bulbs and paroxysmal internal carotid arteries without significant stenosis along with antegrade flow of the bilateral vertebral arteries.  MRI brain/MRA head/neck showed an acute/subacute nonhemorrhagic infarct involving the left parietal lobe along with remote infarcts involving the occipital poles bilaterally the left frontal operculum and cerebellum along with likely sequela of chronic microvascular ischemia, high-grade stenosis of the proximal left A1 segment, moderate stenosis of the proximal right P2 segment.  Patient is being followed by neurology.  Surface echo on 11/10/2019 showed an EF of 55 to 60%, mild LVH, grade 2 diastolic dysfunction, no regional wall motion abnormalities, normal RV systolic function and cavity size, mild mitral valve regurgitation, an ill-defined echodensity adjacent to the posterior leaflet on the atrial side of the mitral valve possibly representing reverberation artifact though unable to exclude calcified mass or partially flail posterior leaflet with calcification with recommendation for TEE.  Patient still with dysarthria which has been feels like it is slightly worse.      Past Medical History:  Diagnosis Date  . Anemia    past  hx  . Anxiety   . Depression   . Diabetes mellitus without complication ([place])   . Dysrhythmia    prior to PFO correction  . GERD (gastroesophageal reflux disease)   . Headache    before PFO closure  . Hypercholesteremia   . Hypertension   . Hypothyroidism   . 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 PFO (  patent foramen ovale)    correction device placed 2010  . Right leg numbness    lower leg- residual from CVA  . Seasonal allergies   . Stroke [facility])    prior to PFO correction    Past Surgical History:  Procedure Laterality Date  . ABDOMINAL HYSTERECTOMY    . CARDIAC  CATHETERIZATION     [facility]  . CATARACT EXTRACTION W/PHACO Right 09/01/2015   Procedure: CATARACT EXTRACTION PHACO AND INTRAOCULAR LENS PLACEMENT (IOC);  Surgeon: Leandrew Koyanagi, MD;  Location: [place];  Service: Ophthalmology;  Laterality: Right;  DIABETIC - oral meds  . CATARACT EXTRACTION W/PHACO Left 10/20/2015   Procedure: CATARACT EXTRACTION PHACO AND INTRAOCULAR LENS PLACEMENT (IOC);  Surgeon: Leandrew Koyanagi, MD;  Location: [place];  Service: Ophthalmology;  Laterality: Left;  DIABETIC - oral meds  . EP IMPLANTABLE DEVICE N/A 04/04/2016   Procedure: Loop Recorder Insertion;  Surgeon: Deboraha Sprang, MD;  Location: [place] CV LAB;  Service: Cardiovascular;  Laterality: N/A;  . FOOT NEUROMA SURGERY Left   . NASAL SINUS SURGERY    . PATENT FORAMEN OVALE CLOSURE    . PATENT FORAMEN OVALE CLOSURE    . TEE WITHOUT CARDIOVERSION N/A 02/09/2016   Procedure: TRANSESOPHAGEAL ECHOCARDIOGRAM (TEE);  Surgeon: Adrian Prows, MD;  Location: [place];  Service: Cardiovascular;  Laterality: N/A;     Home Meds: Prior to Admission medications   Medication Sig Start Date End Date Taking? Authorizing Provider  ALPRAZolam (XANAX) 0.25 MG tablet Take 0.25 mg by mouth 2 (two) times daily as needed for anxiety.   Yes [provider]  amLODipine (NORVASC) 10 MG tablet Take 10 mg by mouth daily. PM   Yes [provider]  buPROPion (WELLBUTRIN SR) 150 MG 12 hr tablet Take 1 tablet (150 mg total) by mouth 2 (two) times daily. 03/27/16  Yes Garvin Fila, MD  cholecalciferol (VITAMIN D3) 25 MCG (1000 UT) tablet Take 1,000 Units by mouth daily.   Yes [provider]  fluticasone (FLONASE) 50 MCG/ACT nasal spray Place into both nostrils daily as needed for allergies or rhinitis.   Yes [provider]  hydrochlorothiazide (MICROZIDE) 12.5 MG capsule Take 12.5 mg by mouth daily.   Yes [provider]  levothyroxine (SYNTHROID, LEVOTHROID)  50 MCG tablet Take 50 mcg by mouth daily before breakfast.   Yes [provider]  losartan (COZAAR) 100 MG tablet Take 100 mg by mouth daily. AM   Yes [provider]  metFORMIN (GLUCOPHAGE) 1000 MG tablet Take 1,000 mg by mouth 2 (two) times daily with a meal.   Yes [provider]  metoprolol (TOPROL-XL) 200 MG 24 hr tablet Take 200 mg by mouth daily.    Yes [provider]  pantoprazole (PROTONIX) 40 MG tablet Take 40 mg by mouth 2 (two) times daily. Reported on 04/17/2016   Yes [provider]  traZODone (DESYREL) 50 MG tablet Take 50 mg by mouth at bedtime.   Yes [provider]  Venlafaxine HCl 225 MG TB24 Take 225 mg by mouth daily with breakfast.    Yes [provider]  vitamin B-12 (CYANOCOBALAMIN) 500 MCG tablet Take 500 mcg by mouth daily. AM   Yes [provider]    Inpatient Medications: Scheduled Meds: . amLODipine  10 mg Oral Daily  . aspirin  300 mg Rectal Daily   Or  . aspirin  325 mg Oral Daily  . buPROPion  150 mg Oral BID  .  cholecalciferol  1,000 Units Oral Daily  . enoxaparin (LOVENOX) injection  40 mg Subcutaneous Q24H  . insulin aspart  0-5 Units Subcutaneous QHS  . insulin aspart  0-9 Units Subcutaneous TID WC  . levothyroxine  50 mcg Oral Q0600  . losartan  100 mg Oral Daily  . metoprolol succinate  200 mg Oral Daily  . nicotine  21 mg Transdermal Daily  . pantoprazole  40 mg Oral BID  . traZODone  50 mg Oral QHS  . venlafaxine XR  225 mg Oral Q breakfast  . vitamin B-12  500 mcg Oral Daily   Continuous Infusions: . sodium chloride     PRN Meds: acetaminophen **OR** acetaminophen (TYLENOL) oral liquid 160 mg/5 mL **OR** acetaminophen, ALPRAZolam, fluticasone, ondansetron (ZOFRAN) IV, senna-docusate  Allergies:   Allergies  Allergen Reactions  . Alendronate Other (See Comments)  . Penicillins Rash    Also swelling and itching    Social History:   Social History    Socioeconomic History  . Marital status: Married    Spouse name: Not on file  . Number of children: Not on file  . Years of education: Not on file  . Highest education level: Not on file  Occupational History  . Not on file  Tobacco Use  . Smoking status: Current Every Day Smoker    Packs/day: 1.00    Years: 55.00    Pack years: 55.00  . Smokeless tobacco: Never Used  Substance and Sexual Activity  . Alcohol use: No  . Drug use: No  . Sexual activity: Not on file  Other Topics Concern  . Not on file  Social History Narrative  . Not on file   Social Determinants of Health   Financial Resource Strain:   . Difficulty of Paying Living Expenses: Not on file  Food Insecurity:   . Worried About Charity fundraiser in the Last Year: Not on file  . Ran Out of Food in the Last Year: Not on file  Transportation Needs:   . Lack of Transportation (Medical): Not on file  . Lack of Transportation (Non-Medical): Not on file  Physical Activity:   . Days of Exercise per Week: Not on file  . Minutes of Exercise per Session: Not on file  Stress:   . Feeling of Stress : Not on file  Social Connections:   . Frequency of Communication with Friends and Family: Not on file  . Frequency of Social Gatherings with Friends and Family: Not on file  . Attends Religious Services: Not on file  . Active Member of Clubs or Organizations: Not on file  . Attends Archivist Meetings: Not on file  . Marital Status: Not on file  Intimate Partner Violence:   . Fear of Current or Ex-Partner: Not on file  . Emotionally Abused: Not on file  . Physically Abused: Not on file  . Sexually Abused: Not on file     Family History:  Family History  Problem Relation Age of Onset  . Dementia Father   . Breast cancer Sister 54    ROS:  Review of Systems  Constitutional: Positive for malaise/fatigue. Negative for chills, diaphoresis, fever and weight loss.  HENT: Negative for congestion.   Eyes:  Negative for discharge and redness.  Respiratory: Negative for cough, hemoptysis, sputum production, shortness of breath and wheezing.   Cardiovascular: Negative for chest pain, palpitations, orthopnea, claudication, leg swelling and PND.  Gastrointestinal: Negative for abdominal pain, blood in stool,  heartburn, melena, nausea and vomiting.  Genitourinary: Negative for hematuria.  Musculoskeletal: Negative for falls and myalgias.  Skin: Negative for rash.  Neurological: Positive for weakness. Negative for dizziness, tingling, tremors, sensory change, speech change, focal weakness and loss of consciousness.       Dysarthria  Endo/Heme/Allergies: Does not bruise/bleed easily.  Psychiatric/Behavioral: Negative for substance abuse. The patient is not nervous/anxious.   All other systems reviewed and are negative.     Physical Exam/Data:   Vitals:   11/11/19 0236 11/11/19 0550 11/11/19 0809 11/11/19 1019  BP: (!) 153/58 (!) 158/66 (!) 172/67 (!) 175/59  Pulse: (!) 55 (!) 54 (!) 53 (!) 55  Resp: 16 16 (!) 21   Temp: 97.7 F (36.5 C) 97.9 F (36.6 C) 97.7 F (36.5 C)   TempSrc: Oral Oral Oral   SpO2: 96% 93% 99%   Weight:      Height:       No intake or output data in the 24 hours ending 11/11/19 1247 Filed Weights   11/10/19 0823  Weight: 60 kg   Body mass index is 21.35 kg/m.   Physical Exam: General: Well developed, well nourished, in no acute distress. Head: Normocephalic, atraumatic, sclera non-icteric, no xanthomas, nares without discharge.  Neck: Negative for carotid bruits. JVD not elevated. Lungs: Clear bilaterally to auscultation without wheezes, rales, or rhonchi. Breathing is unlabored. Heart: RRR with S1 S2. No murmurs, rubs, or gallops appreciated. Abdomen: Soft, non-tender, non-distended with normoactive bowel sounds. No hepatomegaly. No rebound/guarding. No obvious abdominal masses. Msk:  Strength and tone appear normal for age. Extremities: No clubbing or  cyanosis. No edema. Distal pedal pulses are 2+ and equal bilaterally. Neuro: Alert and oriented X 3. No facial asymmetry. No focal deficit. Moves all extremities spontaneously. Psych:  Responds to questions appropriately with a normal affect.   EKG:  The EKG was personally reviewed and demonstrates: Sinus bradycardia, 57 bpm, left anterior fascicular block, LVH with early repolarization abnormality, poor R wave progression along the precordial leads, nonspecific lateral ST-T changes Telemetry:  Telemetry was personally reviewed and demonstrates: SR  Weights: [employer]   11/10/19 0823  Weight: 60 kg    Relevant CV Studies:  2D echo 11/10/2019: 1. Left ventricular ejection fraction, by visual estimation, is 55 to 60%. The left ventricle has normal function. There is mildly increased left ventricular hypertrophy.  2. Elevated left atrial pressure.  3. Left ventricular diastolic parameters are consistent with Grade II diastolic dysfunction (pseudonormalization).  4. The left ventricle has no regional wall motion abnormalities.  5. Global right ventricle has low normal systolic function.The right ventricular size is normal. No increase in right ventricular wall thickness.  6. Left atrial size was normal.  7. Right atrial size was normal.  8. Mild mitral annular calcification.  9. Moderate calcification of the mitral valve leaflet(s). 10. Moderate thickening of the mitral valve leaflet(s). 11. The mitral valve is degenerative. Mild mitral valve regurgitation. No evidence of mitral stenosis. 12. There is an ill-defined echodensity adjacent to the posterior leaflet on the atrial side of the mitral valve. While this could represent reverberation artifact, there is the possibility of a calcified mass or partially flail posterior leaflet with  calcification. This could be further evaluated by TEE, as clinically indicated. 13. The tricuspid valve is normal in structure. 14. The aortic valve was  not well visualized. Aortic valve regurgitation is not visualized. No evidence of aortic valve sclerosis or stenosis. 15. The pulmonic valve was normal  in structure. Pulmonic valve regurgitation is trivial. 16. Mildly dilated pulmonary artery. 17. TR signal is inadequate for assessing pulmonary artery systolic pressure. 18. The inferior vena cava is normal in size with greater than 50% respiratory variability, suggesting right atrial pressure of 3 mmHg. 19. The interatrial septum was not well visualized. __________  TEE 02/09/2016: Result status: Final result   Left ventricle: Normal cavity size, wall thickness, left ventricular diastolic function and left atrial pressure.  Plaque present in the ascending aorta and distal to left subclavian artery is mild (Grade 2: Mild-focal or diffuse int. thick. of 2-62mm). No graft present. No significant coarctation present. No aortic dissection present  Normal atrial septum with no residual atrial septal defect and normal septal motion. PFO closure device present. Amplatzer closure device in good position. No thrombus. No residual shunting by color Doppler or agitated saline injjection.  No cardiac source of cerebral embolism noted. Minimal plaque in the aorta without complex plaque may also potentially be source. __________     2D echo 02/08/2016: - Left ventricle: The cavity size was normal. Systolic function was   normal. The estimated ejection fraction was in the range of 55%   to 60%. Wall motion was normal; there were no regional wall   motion abnormalities. Left ventricular diastolic function   parameters were normal. - Mitral valve: Calcified annulus.   Laboratory Data:  Chemistry Recent Labs  Lab 11/10/19 0828 11/11/19 0541  NA 138 140  K 4.0 4.0  CL 106 107  CO2 21* 22  GLUCOSE 151* 75  BUN 16 15  CREATININE 1.35* 1.23*  CALCIUM 9.5 9.2  GFRNONAA 39* 43*  GFRAA 45* 50*  ANIONGAP 11 11    Recent Labs  Lab 11/10/19 0828    PROT 7.3  ALBUMIN 3.7  AST 18  ALT 13  ALKPHOS 68  BILITOT 0.3   Hematology Recent Labs  Lab 11/10/19 0828 11/11/19 0541  WBC 7.5 6.1  RBC 3.88 3.68*  HGB 12.3 11.7*  HCT 35.3* 34.7*  MCV 91.0 94.3  MCH 31.7 31.8  MCHC 34.8 33.7  RDW 13.0 13.0  PLT 240 245   Cardiac EnzymesNo results for input(s): TROPONINI in the last 168 hours. No results for input(s): TROPIPOC in the last 168 hours.  BNPNo results for input(s): BNP, PROBNP in the last 168 hours.  DDimer No results for input(s): DDIMER in the last 168 hours.  Radiology/Studies:  CT HEAD WO CONTRAST  Result Date: 11/10/2019 IMPRESSION: 1. Concern for acute infarct at the left temporal-occipital-parietal junction. 2. There is mild atrophy with multiple prior infarcts and periventricular small vessel disease as summarized above. 3.  No demonstrable mass or hemorrhage. 4.  Foci of arterial vascular calcification noted. 5.  There is mucosal thickening in several ethmoid air cells. Electronically Signed   By: Lowella Grip III M.D.   On: 11/10/2019 08:49   MR ANGIO HEAD WO CONTRAST  Result Date: 11/11/2019 IMPRESSION: 1. Acute/subacute nonhemorrhagic infarct involving the left parietal lobe. 2. Remote infarcts involving the occipital poles bilaterally, the left frontal operculum, and cerebellum. 3. Mild periventricular white matter disease bilaterally likely reflects the sequela of chronic microvascular ischemia. 4. MRA Circle of Willis demonstrates no significant proximal stenosis, aneurysm, or branch vessel occlusion. 5. High-grade stenosis of the proximal left A1 segment. 6. Moderate stenosis of the proximal right P2 segment. 7. Mild atherosclerotic changes at the cavernous internal carotid arteries bilaterally without other significant stenoses. 8. Signal loss in the internal carotid arteries  bilaterally just below the skull base is likely artifactual. Electronically Signed   By: San Morelle M.D.   On: 11/11/2019 12:17    MR ANGIO NECK WO CONTRAST  Result Date: 11/11/2019 IMPRESSION: 1. Acute/subacute nonhemorrhagic infarct involving the left parietal lobe. 2. Remote infarcts involving the occipital poles bilaterally, the left frontal operculum, and cerebellum. 3. Mild periventricular white matter disease bilaterally likely reflects the sequela of chronic microvascular ischemia. 4. MRA Circle of Willis demonstrates no significant proximal stenosis, aneurysm, or branch vessel occlusion. 5. High-grade stenosis of the proximal left A1 segment. 6. Moderate stenosis of the proximal right P2 segment. 7. Mild atherosclerotic changes at the cavernous internal carotid arteries bilaterally without other significant stenoses. 8. Signal loss in the internal carotid arteries bilaterally just below the skull base is likely artifactual. Electronically Signed   By: San Morelle M.D.   On: 11/11/2019 12:17   MR BRAIN WO CONTRAST  Result Date: 11/11/2019 IMPRESSION: 1. Acute/subacute nonhemorrhagic infarct involving the left parietal lobe. 2. Remote infarcts involving the occipital poles bilaterally, the left frontal operculum, and cerebellum. 3. Mild periventricular white matter disease bilaterally likely reflects the sequela of chronic microvascular ischemia. 4. MRA Circle of Willis demonstrates no significant proximal stenosis, aneurysm, or branch vessel occlusion. 5. High-grade stenosis of the proximal left A1 segment. 6. Moderate stenosis of the proximal right P2 segment. 7. Mild atherosclerotic changes at the cavernous internal carotid arteries bilaterally without other significant stenoses. 8. Signal loss in the internal carotid arteries bilaterally just below the skull base is likely artifactual. Electronically Signed   By: San Morelle M.D.   On: 11/11/2019 12:17   US Carotid Bilateral (at [facility] and [facility] only)  Result Date: 11/10/2019 IMPRESSION: Mild plaque at the level of both carotid bulbs and proximal internal  carotid arteries without significant carotid stenosis identified. Bilateral estimated ICA stenoses of less than 50%. Electronically Signed   By: Aletta Edouard M.D.   On: 11/10/2019 14:34     Assessment and Plan:   1.  Abnormal echo: -Surface echo noted an ill-defined echodensity adjacent to the posterior leaflet on the atrial side of the mitral valve possibly representing reverberation artifact versus calcified mass versus partially flail posterior leaflet with calcification -Recommendation for TEE which will be scheduled on the morning of 11/12/2019 -Patient indicates she has had difficulty with conscious sedation previously leading to significant emesis during a prior attempted EGD.  In this setting, we will ask for anesthesia assistance with sedation to safely undergo TEE -After careful review of history and examination, the risks and benefits of transesophageal echocardiogram have been explained including risks of esophageal damage, perforation (1:10,000 risk), bleeding, pharyngeal hematoma as well as other potential complications associated with conscious sedation including aspiration, arrhythmia, respiratory failure and death. Alternatives to treatment were discussed, questions were answered. Patient is willing to proceed.   2.  Recurrent CVA: -Patient with history of at least 6 strokes and is status post PFO closure as well as documented PAF on loop recorder with recommendation to start Eliquis dating back to 06/2016  -Neurology is following and has recommended escalation of baby aspirin to full dose aspirin which will be continued for now -Further discussion will need to be undertaken with neurology regarding recommendation on when/if to transition to oral anticoagulation in place of antiplatelet therapy, and effort to minimize risk of hemorrhagic conversion, versus dual therapy with oral anticoagulation and aspirin in the setting of the patient's prior PFO closure  3.  PAF: -Initially  documented on Linq recorder in 06/2016 with recommendation to start Eliquis at that time which was continued for a couple of months and discontinued secondary to financial constraints.  She was seen in the A. fib clinic in 12/2018 with increased A. fib burden and again advised to start anticoagulation though did not, again secondary to financial constraints.  More recently she has been noted to have increased A. fib burden of greater than 12 hours by device interrogation in 02/2019 with recommendation to resume oral anticoagulation in place of aspirin during televisit in 03/2019.  However, patient continued baby aspirin at that time -Recommend input from neurology regarding when/if to resume full dose oral anticoagulation -We have discussed in detail with the patient and her husband the benefits and drawbacks of warfarin and DOAC.  At this time, they are not very interested in considering warfarin secondary to frequent INR checks and dietary restrictions.  She has never completed financial assistance paperwork to see if she can obtain Eliquis or Xarelto at a cheaper cost.  We have consulted care management to assist with this matter -Continue rate control with metoprolol  4.  PFO: -Status post Amplatzer closure 05/2009 at [facility] -Follow-up TEE as outlined above  5.  HLD: -LDL of 32 this admission -Not on statin  6.  Ongoing tobacco abuse: -Complete cessation is recommended  7.  HTN: -Remains on amlodipine, losartan, metoprolol   For questions or updates, please contact [facility] Please consult [email or web address] for contact info under Cardiology/STEMI.   Signed, Christell Faith, PA-C [place] Pager: [phone] 11/11/2019, 12:47 PM

## 2019-11-11 NOTE — Evaluation (Signed)
Occupational Therapy Evaluation Patient Details Name: Erin Yates MRN: [id number] DOB: [birth year] Today's Date: 11/11/2019    History of Present Illness From MD notes: Pt is a 74 y.o. female with a history of diabetes, stroke hypertension, who presents with complaints of difficulty speaking x2 days.  Pt does have a history of stroke in the past but last was 4 years ago.  Apparently has deficits in rightward gaze, peripheral vision, and sensation in her right leg from prior stroke, complains of frontal headache.  Is able to answer yes/no questions.  MD head CT impression includes: Concern for acute infarct at the left temporal-occipital-parietal junction.   Clinical Impression   Erin Yates was seen for OT evaluation this date. Prior to hospital admission, pt was active and independent. Pt lives with her spouse in a 2 level home (able to live on main floor) with 3 steps to enter and a bilateral hand rail. Currently pt demonstrates decreased activity tolerance, decreased strength, decreased [place] in BUE with RUE [place] more significantly impacted than LUE, peripheral visual impairment and decreased visual tracking ability. Pt is R hand dominant and requires supervision for safety during all functional mobility and ADL management. Pt also noted to have significant expressive difficulties this date. She required additional cueing/time to complete 1 step VCs intermittently t/o the evaluation. Pt would benefit from skilled OT to address noted impairments and functional limitations (see below for any additional details) in order to maximize safety and independence while minimizing falls risk and caregiver burden. Upon hospital discharge, recommend HHOT to maximize pt safety and return to functional independence during meaningful occupations of daily life.     Follow Up Recommendations  Home health OT;Supervision - Intermittent    Equipment Recommendations  3 in 1 bedside commode    Recommendations for Other  Services       Precautions / Restrictions Precautions Precautions: Fall Precaution Comments: Low fall Restrictions Weight Bearing Restrictions: No      Mobility Bed Mobility Overal bed mobility: Modified Independent             General bed mobility comments: Pt comes to sitting EOB with good confidence/control. Min increased time to perform.  Transfers Overall transfer level: Needs assistance Equipment used: Rolling walker (2 wheeled) Transfers: Sit to/from Stand Sit to Stand: Supervision         General transfer comment: performs STS with good confidence/sequencing/safety awareness this date.    Balance Overall balance assessment: Needs assistance Sitting-balance support: Feet supported Sitting balance-Leahy Scale: Normal Sitting balance - Comments: Steady static/dynamic sitting reaching within BOS.   Standing balance support: During functional activity;No upper extremity supported Standing balance-Leahy Scale: Good Standing balance comment: Steady standing at sink w/o UE support to perform grooming tasks. No LOB during functional activity/mobility.                           ADL either performed or assessed with clinical judgement   ADL Overall ADL's : Needs assistance/impaired Eating/Feeding: Independent;Supervision/ safety Eating/Feeding Details (indicate cue type and reason): Pt denies difficulty eating. Able to open all tray items, bring food to mouth independently this date. Would require supervision for safety during meal prep upon DC. Grooming: Standing;Wash/dry face;Oral care;Supervision/safety Grooming Details (indicate cue type and reason): Pt stands at sink to complete oral care and face washing this date. Good use of BUE during functional task. Able to sequence motor patterns well for set up of toothbrush/toothpaste this date.  Steady balance when standing w/o UE support. Fatigues quickly. Upper Body Bathing: Sitting;Supervision/ safety    Lower Body Bathing: Sitting/lateral leans;Sit to/from stand;Supervison/ safety   Upper Body Dressing : Sitting;Supervision/safety   Lower Body Dressing: Supervision/safety;Sit to/from stand   Toilet Transfer: Set up;Supervision/safety;BSC;RW;Regular Toilet   Toileting- Clothing Manipulation and Hygiene: Set up;Supervision/safety;Sit to/from stand       Functional mobility during ADLs: Set up;Supervision/safety General ADL Comments: Pt functionally limited by decreased activity tolerance, limited peripheral vision (from prior CVA 3+ years ago), and decreased [place] with RUE>LUE this date.     Vision Baseline Vision/History: Wears glasses Wears Glasses: Reading only Patient Visual Report: Peripheral vision impairment(From CVA 3+ years ago. Pt denies additional changes to vision subjectively.) Vision Assessment?: Yes Eye Alignment: Within Functional Limits Tracking/Visual Pursuits: Decreased smoothness of vertical tracking;Unable to hold eye position out of midline;Decreased smoothness of horizontal tracking Saccades: Additional eye shifts occurred during testing;Additional head turns occurred during testing(Pt has significant difficulty tracking visual stimuli in any quadrant/field. Eyes tend to shift back to midline. Will continue to assess within functional context.) Convergence: Impaired - to be further tested in functional context Visual Fields: No apparent deficits     Perception     Praxis      Pertinent Vitals/Pain Pain Assessment: No/denies pain     Hand Dominance Right   Extremity/Trunk Assessment Upper Extremity Assessment Upper Extremity Assessment: RUE deficits/detail;LUE deficits/detail RUE Deficits / Details: Strength grossly 4-/5 and equal left/right; min deficits with finger to nose test but equal left/right. Decreased [place] with sequential opposition noted with RUE more limited than LUE this date. RUE Sensation: WNL RUE Coordination: decreased fine motor LUE  Deficits / Details: See Above LUE Sensation: WNL LUE Coordination: decreased fine motor   Lower Extremity Assessment Lower Extremity Assessment: Generalized weakness;Defer to PT evaluation   Cervical / Trunk Assessment Cervical / Trunk Assessment: Normal   Communication Communication Communication: Expressive difficulties   Cognition Arousal/Alertness: Awake/alert Behavior During Therapy: WFL for tasks assessed/performed Overall Cognitive Status: Difficult to assess                                 General Comments: Pt able to follow 1-step VCs with intermittent increased time/prompting to perform.   General Comments       Exercises Other Exercises Other Exercises: OT engages pt in functional grooming tasks at sink. See ADL section for detail. Other Exercises: Pt educated in falls prevention strategies, safe use of AE for functional mobility, visual strategies for mgt of peripheral visual impairment including "lighthouse technique", and functional activity suggestions for improving [facility] this date. Pt would benefit from review.   Shoulder Instructions      Home Living Family/patient expects to be discharged to:: Private residence Living Arrangements: Spouse/significant other Available Help at Discharge: Family;Available 24 hours/day Type of Home: House Home Access: Stairs to enter [employer] of Steps: 3 Entrance Stairs-Rails: Right;Left;Can reach both Home Layout: Two level;Able to live on main level with bedroom/bathroom     Bathroom Shower/Tub: Tub/shower unit;Curtain   Bathroom Toilet: Standard     Home Equipment: Cane - single point;Walker - 2 wheels;Hand held shower head          Prior Functioning/Environment Level of Independence: Independent        Comments: Ind amb community distances without an AD, no fall history, Ind with ADLs        OT Problem List: Decreased  strength;Decreased coordination;Impaired  vision/perception;Impaired balance (sitting and/or standing);Impaired UE functional use;Decreased activity tolerance;Decreased safety awareness;Decreased cognition      OT Treatment/Interventions: Self-care/ADL training;Therapeutic exercise;Therapeutic activities;DME and/or AE instruction;Patient/family education;Balance training    OT Goals(Current goals can be found in the care plan section) Acute Rehab OT Goals Patient Stated Goal: to go home OT Goal Formulation: With patient Time For Goal Achievement: 11/25/19 Potential to Achieve Goals: Good ADL Goals Pt Will Perform Upper Body Dressing: sitting;with modified independence Pt Will Perform Lower Body Dressing: sit to/from stand;with adaptive equipment;with modified independence(With LRAD PRN for improved safety and functional independence.) Additional ADL Goal #1: Pt and/or caregiver will independently verbalize a plan to implement at least 3 learned falls prevention strategies within her daily routines/home environment for improved safety and functional independence upon hospital DC.  OT Frequency: Min 2X/week   Barriers to D/C:            Co-evaluation              AM-PAC OT "6 Clicks" Daily Activity     Outcome Measure Help from another person eating meals?: None Help from another person taking care of personal grooming?: A Little Help from another person toileting, which includes using toliet, bedpan, or urinal?: A Little Help from another person bathing (including washing, rinsing, drying)?: A Little Help from another person to put on and taking off regular upper body clothing?: A Little Help from another person to put on and taking off regular lower body clothing?: A Little 6 Click Score: 19   End of Session Equipment Utilized During Treatment: Gait belt;Rolling walker  Activity Tolerance: Patient tolerated treatment well Patient left: in chair;with call bell/phone within reach;with chair alarm set  OT Visit  Diagnosis: Other abnormalities of gait and mobility (R26.89);Cognitive communication deficit (R41.841) Symptoms and signs involving cognitive functions: Cerebral infarction                Time: [id number] OT Time Calculation (min): 51 min Charges:  OT General Charges $OT Visit: 1 Visit OT Evaluation $OT Eval Moderate Complexity: 1 Mod OT Treatments $Self Care/Home Management : 23-37 mins  Erin Yates, M.S., OTR/L Ascom: [phone] 11/11/19, 11:34 AM

## 2019-11-11 NOTE — TOC Initial Note (Signed)
Transition of Care [facility]) - Initial/Assessment Note    Patient Details  Name: Erin Yates MRN: [id number] Date of Birth: Aug 26, 1946  Transition of Care [facility]) CM/SW Contact:    Shelbie Hutching, RN Phone Number: 11/11/2019, 10:24 AM  Clinical Narrative:                 Patient placed in observation for stroke, MRI ordered and should be completed today.  Patient has a history of previous stroke.  Patient is from home and lives with husband.  Husband reports that patient is independent in ADL's but she does not drive.  Husband drives.  Patient has not needed any equipment but they have a walker, cane, and wheelchair available if needed.   Patient agrees to home health, referral given to [place] with [facility] for PT and OT.    Expected Discharge Plan: [place] Barriers to Discharge: Continued Medical Work up   Patient Goals and CMS Choice Patient states their goals for this hospitalization and ongoing recovery are:: To get home CMS Medicare.gov Compare Post Acute Care list provided to:: Patient Choice offered to / list presented to : Patient, Spouse  Expected Discharge Plan and Services Expected Discharge Plan: [place]   Discharge Planning Services: CM Consult Post Acute Care Choice: [place] arrangements for the past 2 months: [place] Arranged: PT, OT HH Agency: [facility] Date [place]: 11/11/19 Time [place]: [id number] Representative spoke with at [place]: Jana Half  Prior Living Arrangements/Services Living arrangements for the past 2 months: [place] with:: Spouse Patient language and need for interpreter reviewed:: Yes Do you feel safe going back to the place where you live?: Yes      Need for Family Participation in Patient Care: Yes (Comment)(stroke) Care giver support system in place?: Yes (comment)(husband  [name]) Current home services: DME(walker, cane, wheelchair) Criminal Activity/Legal Involvement Pertinent to Current Situation/Hospitalization: No - Comment as needed  Activities of Daily Living Home Assistive Devices/Equipment: None ADL Screening (condition at time of admission) Patient's cognitive ability adequate to safely complete daily activities?: Yes Is the patient deaf or have difficulty hearing?: No Does the patient have difficulty seeing, even when wearing glasses/contacts?: No Does the patient have difficulty concentrating, remembering, or making decisions?: No Patient able to express need for assistance with ADLs?: Yes Does the patient have difficulty dressing or bathing?: No Independently performs ADLs?: Yes (appropriate for developmental age) Does the patient have difficulty walking or climbing stairs?: No Weakness of Legs: None Weakness of Arms/Hands: None  Permission Sought/Granted Permission sought to share information with : Case Manager, Family Supports, Other (comment) Permission granted to share information with : Yes, Verbal Permission Granted     Permission granted to share info w AGENCY: [facility]  Permission granted to share info w Relationship: Husband [name]     Emotional Assessment Appearance:: Appears stated age Attitude/Demeanor/Rapport: Engaged Affect (typically observed): Accepting Orientation: : Oriented to Self, Oriented to Place, Oriented to  Time, Oriented to Situation Alcohol / Substance Use: Not Applicable Psych Involvement: No (comment)  Admission diagnosis:  Stroke [facility]) [I63.9] Cerebrovascular accident (CVA), unspecified mechanism ([place]) [I63.9] Patient Active Problem List   Diagnosis Date Noted  . Stroke ([place]) 11/10/2019  . Type II diabetes mellitus  with renal manifestations ([place]) 11/10/2019  . Depression   . GERD (gastroesophageal reflux disease)   . Hypertension   . Hypothyroidism   . Hypercholesteremia   . Tobacco abuse   .  Cryptogenic stroke ([place]) 03/27/2016  . Stroke (cerebrum) ([place])   . CVA (cerebral infarction) 02/07/2016   PCP:  Adin Hector, MD Pharmacy:   [facility] [phone] [address], [address] [place] [address] Phone: [phone] Fax: [facility] [place], [place] [address] Phone: [phone] Fax: [phone]     Social Determinants of Health (SDOH) Interventions    Readmission Risk Interventions No flowsheet data found.

## 2019-11-12 ENCOUNTER — Encounter: Payer: Self-pay | Admitting: Internal Medicine

## 2019-11-12 ENCOUNTER — Encounter
Admission: EM | Disposition: A | Discharge status: Home Health | Payer: Self-pay | Source: Home / Self Care | Attending: Internal Medicine

## 2019-11-12 ENCOUNTER — Inpatient Hospital Stay: Payer: Medicare HMO | Admitting: Anesthesiology

## 2019-11-12 ENCOUNTER — Inpatient Hospital Stay (HOSPITAL_COMMUNITY)
Admit: 2019-11-12 | Discharge: 2019-11-12 | Disposition: A | Discharge status: Home / Self Care | Payer: Medicare HMO | Attending: Physician Assistant | Admitting: Physician Assistant

## 2019-11-12 DIAGNOSIS — I6389 Other cerebral infarction: Secondary | ICD-10-CM

## 2019-11-12 DIAGNOSIS — I361 Nonrheumatic tricuspid (valve) insufficiency: Secondary | ICD-10-CM

## 2019-11-12 DIAGNOSIS — I340 Nonrheumatic mitral (valve) insufficiency: Secondary | ICD-10-CM

## 2019-11-12 DIAGNOSIS — I4891 Unspecified atrial fibrillation: Secondary | ICD-10-CM

## 2019-11-12 DIAGNOSIS — I059 Rheumatic mitral valve disease, unspecified: Secondary | ICD-10-CM

## 2019-11-12 HISTORY — PX: TEE WITHOUT CARDIOVERSION: SHX5443

## 2019-11-12 LAB — BASIC METABOLIC PANEL
Anion gap: 7 (ref 5–15)
BUN: 14 mg/dL (ref 8–23)
CO2: 24 mmol/L (ref 22–32)
Calcium: 9.2 mg/dL (ref 8.9–10.3)
Chloride: 109 mmol/L (ref 98–111)
Creatinine, Ser: 1.22 mg/dL — ABNORMAL HIGH (ref 0.44–1.00)
GFR calc Af Amer: 51 mL/min — ABNORMAL LOW (ref >60)
GFR calc non Af Amer: 44 mL/min — ABNORMAL LOW (ref >60)
Glucose, Bld: 106 mg/dL — ABNORMAL HIGH (ref 70–99)
Potassium: 3.6 mmol/L (ref 3.5–5.1)
Sodium: 140 mmol/L (ref 135–145)

## 2019-11-12 LAB — GLUCOSE, CAPILLARY
Glucose-Capillary: 101 mg/dL — ABNORMAL HIGH (ref 70–99)
Glucose-Capillary: 158 mg/dL — ABNORMAL HIGH (ref 70–99)
Glucose-Capillary: 134 mg/dL — ABNORMAL HIGH (ref 70–99)
Glucose-Capillary: 102 mg/dL — ABNORMAL HIGH (ref 70–99)
Glucose-Capillary: 138 mg/dL — ABNORMAL HIGH (ref 70–99)

## 2019-11-12 LAB — CBC
HCT: 34.8 % — ABNORMAL LOW (ref 36.0–46.0)
Hemoglobin: 11.6 g/dL — ABNORMAL LOW (ref 12.0–15.0)
MCH: 31 pg (ref 26.0–34.0)
MCHC: 33.3 g/dL (ref 30.0–36.0)
MCV: 93 fL (ref 80.0–100.0)
Platelets: 227 10*3/uL (ref 150–400)
RBC: 3.74 MIL/uL — ABNORMAL LOW (ref 3.87–5.11)
RDW: 12.9 % (ref 11.5–15.5)
WBC: 5.8 10*3/uL (ref 4.0–10.5)
nRBC: 0 % (ref 0.0–0.2)

## 2019-11-12 SURGERY — ECHOCARDIOGRAM, TRANSESOPHAGEAL
Anesthesia: General

## 2019-11-12 MED ORDER — PROPOFOL 10 MG/ML IV BOLUS
INTRAVENOUS | Status: DC | PRN
  Administered 2019-11-12 (×3): 20 mg via INTRAVENOUS
  Administered 2019-11-12: 40 mg via INTRAVENOUS
  Administered 2019-11-12: 10 mg via INTRAVENOUS
  Administered 2019-11-12: 20 mg via INTRAVENOUS
  Administered 2019-11-12 (×2): 10 mg via INTRAVENOUS
  Administered 2019-11-12: 20 mg via INTRAVENOUS

## 2019-11-12 MED ORDER — SODIUM CHLORIDE FLUSH 0.9 % IV SOLN
INTRAVENOUS | Status: AC
  Filled 2019-11-12: qty 10

## 2019-11-12 MED ORDER — LIDOCAINE VISCOUS HCL 2 % MT SOLN
OROMUCOSAL | Status: AC
  Filled 2019-11-12: qty 15

## 2019-11-12 MED ORDER — PROPOFOL 10 MG/ML IV BOLUS
INTRAVENOUS | Status: AC
  Filled 2019-11-12: qty 20

## 2019-11-12 MED ORDER — BUTAMBEN-TETRACAINE-BENZOCAINE 2-2-14 % EX AERO
INHALATION_SPRAY | CUTANEOUS | Status: AC
  Filled 2019-11-12: qty 5

## 2019-11-12 MED ORDER — METOPROLOL TARTRATE 5 MG/5ML IV SOLN
5.0000 mg | INTRAVENOUS | Status: AC
  Administered 2019-11-12: 5 mg via INTRAVENOUS
  Filled 2019-11-12: qty 5

## 2019-11-12 MED ORDER — SODIUM CHLORIDE 0.9 % IV SOLN: INTRAVENOUS | Status: DC

## 2019-11-12 NOTE — Progress Notes (Signed)
PROGRESS NOTE                                                                                                                                                                                                             Patient Demographics:    Erin Yates, is a 74 y.o. female, DOB - September 03, 1946, [id number]  Admit date - 11/10/2019   Admitting Physician Wyvonnia Dusky, MD  Outpatient Primary MD for the patient is Adin Hector, MD  LOS - 1    Chief Complaint  Patient presents with  . Aphasia       Brief Narrative   74 year old female with history of stroke, paroxysmal A. 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 CT of the head in the ED showed possible left temporal-occipital stroke.   Subjective:   Patient seen after returning from TEE. Still stuttering during conversation. Noted to be in A. fib with heart rate in the 130s overnight. No weakness.   Assessment  & Plan :    Principal Problem: Acute ischemic stroke Acute left parietal stroke with high-grade stenosis of the proximal left A1 segment and moderate stenosis of proximal right P2 segment. 2D echo with EF of 55 and 60% and grade 2 diastolic dysfunction. TEE done given echo finding of ill-defined echodensity at this into the posterior leaflet of the atrial side of the mitral valve concerning for calcified mass versus partially flail posterior leaflet with calcification. Shows normal EF, no PFO and no LA or LA appendage thrombus. Patient was on baby aspirin prior to admission. Stroke likely contributed by paroxysmal A. fib. Neurology recommends anticoagulation (NOAC) which can be started early next week, dual antiplatelet for now. Care management  consulted to assist in obtaining NOAC at an affordable cost. Patient refuses to take warfarin given frequent need to check INR. Continue statin. Home health PT and speech therapy upon discharge   Active Problems: Paroxysmal A. fib Noted to be tachycardic overnight in 130s. Serial IV metoprolol. Currently rate controlled. Continue Toprol. CHA2DS2-VASc of 6. Not on anticoagulation due to cost issues and refusing warfarin due to need for frequent INR monitoring. TOC consulted for medication assistance.  Depression, chronic Continue bupropion. Stable  Essential hypertension Allowed permissive hypertension. Resumed home dose amlodipine, losartan and metoprolol. HCTZ held  Hypothyroidism Continue Synthroid  Hyperlipidemia Continue statin  Tobacco use Counseled on cessation. Nicotine patch  Diabetes mellitus type 2, controlled Monitor on sliding scale coverage.  Acute on chronic versus chronic kidney disease stage III No baseline creatinine in the system for past 3 years. Improving.     Code Status : Full code Family Communication  : Husband at bedside  Disposition Plan  : Home possibly tomorrow if heart rate stable overnight  Barriers For Discharge : Active symptoms  Consults  : Neurology, cardiology  Procedures  : CT head, MRI brain, TEE  DVT Prophylaxis  :  Lovenox -   Lab Results  Component Value Date   PLT 227 11/12/2019    Antibiotics  :   Anti-infectives (From admission, onward)   None        Objective:   Vitals:   11/12/19 0830 11/12/19 1019 11/12/19 1052 11/12/19 1208  BP: (!) 153/87 131/76 (!) 134/55 (!) 151/80  Pulse: 69   (!) 56  Resp: 14  16   Temp: 98.4 F (36.9 C)   98.3 F (36.8 C)  TempSrc: Oral   Oral  SpO2: 98%  99%   Weight:      Height:        Wt Readings from Last 3 Encounters:  11/10/19 60 kg  08/06/19 60.8 kg  03/11/19 60.8 kg     Intake/Output Summary (Last 24 hours) at 11/12/2019 1411 Last data filed at 11/12/2019  1050 Gross per 24 hour  Intake 120 ml  Output --  Net 120 ml     Physical Exam  Gen: not in distress, stuttering HEENT:moist mucosa, supple neck Chest: clear b/l, no added sounds CVS: S1-S2 irregular, no murmurs GI: soft, NT, ND,  Musculoskeletal: warm, no edema     Data Review:    CBC Recent Labs  Lab 11/10/19 0828 11/11/19 0541 11/12/19 0402  WBC 7.5 6.1 5.8  HGB 12.3 11.7* 11.6*  HCT 35.3* 34.7* 34.8*  PLT 240 245 227  MCV 91.0 94.3 93.0  MCH 31.7 31.8 31.0  MCHC 34.8 33.7 33.3  RDW 13.0 13.0 12.9  LYMPHSABS 2.6  --   --   MONOABS 0.4  --   --   EOSABS 0.1  --   --   BASOSABS 0.0  --   --     Chemistries  Recent Labs  Lab 11/10/19 0828 11/11/19 0541 11/12/19 0402  NA 138 140 140  K 4.0 4.0 3.6  CL 106 107 109  CO2 21* 22 24  GLUCOSE 151* 75 106*  BUN 16 15 14   CREATININE 1.35* 1.23* 1.22*  CALCIUM 9.5 9.2 9.2  AST 18  --   --   ALT 13  --   --   ALKPHOS 68  --   --   BILITOT 0.3  --   --    ------------------------------------------------------------------------------------------------------------------ Recent Labs    11/11/19 0541  CHOL 141  HDL 33*  LDLCALC 32  TRIG 379*  CHOLHDL 4.3    Lab Results  Component Value Date   HGBA1C 6.5 (H) 11/10/2019   ------------------------------------------------------------------------------------------------------------------ No results for input(s): TSH, T4TOTAL, T3FREE, THYROIDAB in the last 72 hours.  Invalid input(s): FREET3 ------------------------------------------------------------------------------------------------------------------ No results for input(s): VITAMINB12, FOLATE, FERRITIN, TIBC, IRON, RETICCTPCT in the last 72 hours.  Coagulation profile Recent Labs  Lab 11/10/19 0828  INR 0.9  No results for input(s): DDIMER in the last 72 hours.  Cardiac Enzymes No results for input(s): CKMB, TROPONINI, MYOGLOBIN in the last 168 hours.  Invalid input(s):  CK ------------------------------------------------------------------------------------------------------------------ No results found for: BNP  Inpatient Medications  Scheduled Meds: . amLODipine  10 mg Oral Daily  . aspirin EC  81 mg Oral Daily  . buPROPion  150 mg Oral BID  . butamben-tetracaine-benzocaine      . cholecalciferol  1,000 Units Oral Daily  . clopidogrel  75 mg Oral Daily  . enoxaparin (LOVENOX) injection  40 mg Subcutaneous Q24H  . insulin aspart  0-5 Units Subcutaneous QHS  . insulin aspart  0-9 Units Subcutaneous TID WC  . levothyroxine  50 mcg Oral Q0600  . lidocaine      . losartan  100 mg Oral Daily  . metoprolol succinate  200 mg Oral Daily  . nicotine  21 mg Transdermal Daily  . pantoprazole  40 mg Oral BID  . sodium chloride flush      . traZODone  50 mg Oral QHS  . venlafaxine XR  225 mg Oral Q breakfast  . vitamin B-12  500 mcg Oral Daily   Continuous Infusions: . sodium chloride     PRN Meds:.acetaminophen **OR** acetaminophen (TYLENOL) oral liquid 160 mg/5 mL **OR** acetaminophen, ALPRAZolam, fluticasone, ondansetron (ZOFRAN) IV, senna-docusate  Micro Results Recent Results (from the past 240 hour(s))  SARS CORONAVIRUS 2 (TAT 6-24 HRS) Nasopharyngeal Nasopharyngeal Swab     Status: None   Collection Time: 11/10/19 10:11 AM   Specimen: Nasopharyngeal Swab  Result Value Ref Range Status   SARS Coronavirus 2 NEGATIVE NEGATIVE Final    Comment: (NOTE) SARS-CoV-2 target nucleic acids are NOT DETECTED. The SARS-CoV-2 RNA is generally detectable in upper and lower respiratory specimens during the acute phase of infection. Negative results do not preclude SARS-CoV-2 infection, do not rule out co-infections with other pathogens, and should not be used as the sole basis for treatment or other patient management decisions. Negative results must be combined with clinical observations, patient history, and epidemiological information. The  expected result is Negative. Fact Sheet for Patients: SugarRoll.be Fact Sheet for Healthcare Providers: https://www.woods-mathews.com/ This test is not yet approved or cleared by the [place] FDA and  has been authorized for detection and/or diagnosis of SARS-CoV-2 by FDA under an Emergency Use Authorization (EUA). This EUA will remain  in effect (meaning this test can be used) for the duration of the COVID-19 declaration under Section 56 4(b)(1) of the Act, 21 U.S.C. section 360bbb-3(b)(1), unless the authorization is terminated or revoked sooner. Performed at [facility], [place] [address] [place], [place] [address]     Radiology Reports CT HEAD WO CONTRAST  Result Date: 11/10/2019 CLINICAL DATA:  Dysarthria EXAM: CT HEAD WITHOUT CONTRAST TECHNIQUE: Contiguous axial images were obtained from the base of the skull through the vertex without intravenous contrast. COMPARISON:  Head CT February 07, 2016; brain MRI February 08, 2016. FINDINGS: Brain: There is mild diffuse atrophy. There is no demonstrable intracranial mass, hemorrhage, extra-axial fluid collection, or midline shift. There is an acute appearing infarct on the left at the left temporal-parietal-occipital junction with localized cytotoxic edema in this area. There are prior infarcts evident in each medial superior occipital lobe as well as in the mid right occipital lobe. There is evidence of a prior infarct involving portions of the head of the caudate nucleus on the right as well as portions of the anterior limbs of  the right internal and external capsule. There is a prior infarct at the gray-white junction of the posterior left frontal lobe. There is mild small vessel disease in the centra semiovale bilaterally. There is evidence of a prior small infarct in the left cerebellum along the inferior dentate nucleus level. Vascular: There is no hyperdense vessel. There is calcification in each  carotid siphon region. Skull: The bony calvarium appears intact. Sinuses/Orbits: There is mucosal thickening in several ethmoid air cells. Other visualized paranasal sinuses are clear. Visualized orbits appear symmetric bilaterally. Other: Visualized mastoid air cells are clear. IMPRESSION: 1. Concern for acute infarct at the left temporal-occipital-parietal junction. 2. There is mild atrophy with multiple prior infarcts and periventricular small vessel disease as summarized above. 3.  No demonstrable mass or hemorrhage. 4.  Foci of arterial vascular calcification noted. 5.  There is mucosal thickening in several ethmoid air cells. Electronically Signed   By: Lowella Grip III M.D.   On: 11/10/2019 08:49   MR ANGIO HEAD WO CONTRAST  Result Date: 11/11/2019 CLINICAL DATA:  Abnormal speech beginning 3 days ago. Dysarthria. EXAM: MRI HEAD WITHOUT CONTRAST MRA HEAD WITHOUT CONTRAST MRA NECK WITHOUT CONTRAST TECHNIQUE: Multiplanar, multiecho pulse sequences of the brain and surrounding structures were obtained without intravenous contrast. Angiographic images of the Circle of Willis were obtained using MRA technique without intravenous contrast. Angiographic images of the neck were obtained using MRA technique without intravenous contrast. Carotid stenosis measurements (when applicable) are obtained utilizing NASCET criteria, using the distal internal carotid diameter as the denominator. COMPARISON:  None. FINDINGS: MRI HEAD FINDINGS Brain: The diffusion-weighted images confirm an acute/subacute nonhemorrhagic infarct involving the left parietal lobe. Remote infarcts with volume loss are present in the occipital poles bilaterally. A smaller remote infarct is present in the right parietal lobe. A remote cortical infarct is present in the left frontal operculum. Remote lacunar infarcts are present in the right basal ganglia. Mild periventricular white matter changes are present bilaterally. The ventricles are of  normal size. No significant extraaxial fluid collection is present. White matter changes extend into the brainstem. Remote nonhemorrhagic lacunar infarcts are present in the cerebellum bilaterally. Vascular: Flow is present in the major intracranial arteries. Skull and upper cervical spine: The craniocervical junction is normal. Upper cervical spine is within normal limits. Marrow signal is unremarkable. Sinuses/Orbits: A polyp or mucous retention cyst is present in the left sphenoid sinus. Left maxillary antrostomy is noted. Left ethmoidectomies are noted. No other significant mucosal disease is present in the paranasal sinuses. Minimal fluid is present in the mastoid air cells bilaterally. No obstructing nasopharyngeal lesion is present. Bilateral lens replacements are noted. Globes and orbits are otherwise unremarkable. MRA HEAD FINDINGS There is significant signal loss just below the skull base in the internal carotid arteries bilaterally. This is likely artifactual. Mild atherosclerotic changes are present through the cavernous internal carotid arteries bilaterally. There is no significant stenosis. ICA termini are normal. High-grade stenosis is present in the proximal left A1 segment. Right A1 segment is small. M1 segments are normal bilaterally. MCA bifurcations are within normal limits. There is some attenuation of distal MCA branch vessels without a significant proximal stenosis or occlusion. The left vertebral artery is slightly dominant to the right. PICA origins are visualized and normal. Basilar artery is normal. Both posterior cerebral arteries originate from basilar tip. Moderate stenosis is present in the proximal right P2 segment. There is some attenuation of distal PCA branch vessels bilaterally. MRA NECK FINDINGS There is a  common origin of the left common carotid artery in the innominate artery. No significant flow disturbance is present at either carotid bifurcation. Flow is antegrade in the  vertebral arteries bilaterally without significant stenosis IMPRESSION: 1. Acute/subacute nonhemorrhagic infarct involving the left parietal lobe. 2. Remote infarcts involving the occipital poles bilaterally, the left frontal operculum, and cerebellum. 3. Mild periventricular white matter disease bilaterally likely reflects the sequela of chronic microvascular ischemia. 4. MRA Circle of Willis demonstrates no significant proximal stenosis, aneurysm, or branch vessel occlusion. 5. High-grade stenosis of the proximal left A1 segment. 6. Moderate stenosis of the proximal right P2 segment. 7. Mild atherosclerotic changes at the cavernous internal carotid arteries bilaterally without other significant stenoses. 8. Signal loss in the internal carotid arteries bilaterally just below the skull base is likely artifactual. Electronically Signed   By: San Morelle M.D.   On: 11/11/2019 12:17   MR ANGIO NECK WO CONTRAST  Result Date: 11/11/2019 CLINICAL DATA:  Abnormal speech beginning 3 days ago. Dysarthria. EXAM: MRI HEAD WITHOUT CONTRAST MRA HEAD WITHOUT CONTRAST MRA NECK WITHOUT CONTRAST TECHNIQUE: Multiplanar, multiecho pulse sequences of the brain and surrounding structures were obtained without intravenous contrast. Angiographic images of the Circle of Willis were obtained using MRA technique without intravenous contrast. Angiographic images of the neck were obtained using MRA technique without intravenous contrast. Carotid stenosis measurements (when applicable) are obtained utilizing NASCET criteria, using the distal internal carotid diameter as the denominator. COMPARISON:  None. FINDINGS: MRI HEAD FINDINGS Brain: The diffusion-weighted images confirm an acute/subacute nonhemorrhagic infarct involving the left parietal lobe. Remote infarcts with volume loss are present in the occipital poles bilaterally. A smaller remote infarct is present in the right parietal lobe. A remote cortical infarct is present in  the left frontal operculum. Remote lacunar infarcts are present in the right basal ganglia. Mild periventricular white matter changes are present bilaterally. The ventricles are of normal size. No significant extraaxial fluid collection is present. White matter changes extend into the brainstem. Remote nonhemorrhagic lacunar infarcts are present in the cerebellum bilaterally. Vascular: Flow is present in the major intracranial arteries. Skull and upper cervical spine: The craniocervical junction is normal. Upper cervical spine is within normal limits. Marrow signal is unremarkable. Sinuses/Orbits: A polyp or mucous retention cyst is present in the left sphenoid sinus. Left maxillary antrostomy is noted. Left ethmoidectomies are noted. No other significant mucosal disease is present in the paranasal sinuses. Minimal fluid is present in the mastoid air cells bilaterally. No obstructing nasopharyngeal lesion is present. Bilateral lens replacements are noted. Globes and orbits are otherwise unremarkable. MRA HEAD FINDINGS There is significant signal loss just below the skull base in the internal carotid arteries bilaterally. This is likely artifactual. Mild atherosclerotic changes are present through the cavernous internal carotid arteries bilaterally. There is no significant stenosis. ICA termini are normal. High-grade stenosis is present in the proximal left A1 segment. Right A1 segment is small. M1 segments are normal bilaterally. MCA bifurcations are within normal limits. There is some attenuation of distal MCA branch vessels without a significant proximal stenosis or occlusion. The left vertebral artery is slightly dominant to the right. PICA origins are visualized and normal. Basilar artery is normal. Both posterior cerebral arteries originate from basilar tip. Moderate stenosis is present in the proximal right P2 segment. There is some attenuation of distal PCA branch vessels bilaterally. MRA NECK FINDINGS There  is a common origin of the left common carotid artery in the innominate artery. No significant flow  disturbance is present at either carotid bifurcation. Flow is antegrade in the vertebral arteries bilaterally without significant stenosis IMPRESSION: 1. Acute/subacute nonhemorrhagic infarct involving the left parietal lobe. 2. Remote infarcts involving the occipital poles bilaterally, the left frontal operculum, and cerebellum. 3. Mild periventricular white matter disease bilaterally likely reflects the sequela of chronic microvascular ischemia. 4. MRA Circle of Willis demonstrates no significant proximal stenosis, aneurysm, or branch vessel occlusion. 5. High-grade stenosis of the proximal left A1 segment. 6. Moderate stenosis of the proximal right P2 segment. 7. Mild atherosclerotic changes at the cavernous internal carotid arteries bilaterally without other significant stenoses. 8. Signal loss in the internal carotid arteries bilaterally just below the skull base is likely artifactual. Electronically Signed   By: San Morelle M.D.   On: 11/11/2019 12:17   MR BRAIN WO CONTRAST  Result Date: 11/11/2019 CLINICAL DATA:  Abnormal speech beginning 3 days ago. Dysarthria. EXAM: MRI HEAD WITHOUT CONTRAST MRA HEAD WITHOUT CONTRAST MRA NECK WITHOUT CONTRAST TECHNIQUE: Multiplanar, multiecho pulse sequences of the brain and surrounding structures were obtained without intravenous contrast. Angiographic images of the Circle of Willis were obtained using MRA technique without intravenous contrast. Angiographic images of the neck were obtained using MRA technique without intravenous contrast. Carotid stenosis measurements (when applicable) are obtained utilizing NASCET criteria, using the distal internal carotid diameter as the denominator. COMPARISON:  None. FINDINGS: MRI HEAD FINDINGS Brain: The diffusion-weighted images confirm an acute/subacute nonhemorrhagic infarct involving the left parietal lobe. Remote  infarcts with volume loss are present in the occipital poles bilaterally. A smaller remote infarct is present in the right parietal lobe. A remote cortical infarct is present in the left frontal operculum. Remote lacunar infarcts are present in the right basal ganglia. Mild periventricular white matter changes are present bilaterally. The ventricles are of normal size. No significant extraaxial fluid collection is present. White matter changes extend into the brainstem. Remote nonhemorrhagic lacunar infarcts are present in the cerebellum bilaterally. Vascular: Flow is present in the major intracranial arteries. Skull and upper cervical spine: The craniocervical junction is normal. Upper cervical spine is within normal limits. Marrow signal is unremarkable. Sinuses/Orbits: A polyp or mucous retention cyst is present in the left sphenoid sinus. Left maxillary antrostomy is noted. Left ethmoidectomies are noted. No other significant mucosal disease is present in the paranasal sinuses. Minimal fluid is present in the mastoid air cells bilaterally. No obstructing nasopharyngeal lesion is present. Bilateral lens replacements are noted. Globes and orbits are otherwise unremarkable. MRA HEAD FINDINGS There is significant signal loss just below the skull base in the internal carotid arteries bilaterally. This is likely artifactual. Mild atherosclerotic changes are present through the cavernous internal carotid arteries bilaterally. There is no significant stenosis. ICA termini are normal. High-grade stenosis is present in the proximal left A1 segment. Right A1 segment is small. M1 segments are normal bilaterally. MCA bifurcations are within normal limits. There is some attenuation of distal MCA branch vessels without a significant proximal stenosis or occlusion. The left vertebral artery is slightly dominant to the right. PICA origins are visualized and normal. Basilar artery is normal. Both posterior cerebral arteries  originate from basilar tip. Moderate stenosis is present in the proximal right P2 segment. There is some attenuation of distal PCA branch vessels bilaterally. MRA NECK FINDINGS There is a common origin of the left common carotid artery in the innominate artery. No significant flow disturbance is present at either carotid bifurcation. Flow is antegrade in the vertebral arteries bilaterally without significant  stenosis IMPRESSION: 1. Acute/subacute nonhemorrhagic infarct involving the left parietal lobe. 2. Remote infarcts involving the occipital poles bilaterally, the left frontal operculum, and cerebellum. 3. Mild periventricular white matter disease bilaterally likely reflects the sequela of chronic microvascular ischemia. 4. MRA Circle of Willis demonstrates no significant proximal stenosis, aneurysm, or branch vessel occlusion. 5. High-grade stenosis of the proximal left A1 segment. 6. Moderate stenosis of the proximal right P2 segment. 7. Mild atherosclerotic changes at the cavernous internal carotid arteries bilaterally without other significant stenoses. 8. Signal loss in the internal carotid arteries bilaterally just below the skull base is likely artifactual. Electronically Signed   By: San Morelle M.D.   On: 11/11/2019 12:17   US Carotid Bilateral (at [facility] and [facility] only)  Result Date: 11/10/2019 CLINICAL DATA:  Cerebral infarction. History hypertension, hyperlipidemia and coronary artery disease. EXAM: BILATERAL CAROTID DUPLEX ULTRASOUND TECHNIQUE: Pearline Cables scale imaging, color Doppler and duplex ultrasound were performed of bilateral carotid and vertebral arteries in the neck. COMPARISON:  11/19/2007 FINDINGS: Criteria: Quantification of carotid stenosis is based on velocity parameters that correlate the residual internal carotid diameter with NASCET-based stenosis levels, using the diameter of the distal internal carotid lumen as the denominator for stenosis measurement. The following velocity  measurements were obtained: RIGHT ICA:  85/20 cm/sec CCA:  [id number] cm/sec SYSTOLIC ICA/CCA RATIO:  1.5 ECA:  76 cm/sec LEFT ICA:  45/11 cm/sec CCA:  [id number] cm/sec SYSTOLIC ICA/CCA RATIO:  1.1 ECA:  68 cm/sec RIGHT CAROTID ARTERY: Intimal thickening in the common carotid artery and carotid bulb. There is a mild amount of noncalcified plaque at the level of the carotid bulb. Minimal partially calcified plaque in the proximal right ICA. No evidence of significant stenosis with estimated right ICA stenosis of less than 50%. RIGHT VERTEBRAL ARTERY: Antegrade flow with normal waveform and velocity. LEFT CAROTID ARTERY: Mild calcified plaque at the level of the left ICA and left carotid bulb. No significant stenosis identified with estimated left ICA stenosis of less than 50%. LEFT VERTEBRAL ARTERY: Antegrade flow with normal waveform and velocity. IMPRESSION: Mild plaque at the level of both carotid bulbs and proximal internal carotid arteries without significant carotid stenosis identified. Bilateral estimated ICA stenoses of less than 50%. Electronically Signed   By: Aletta Edouard M.D.   On: 11/10/2019 14:34   ECHOCARDIOGRAM COMPLETE  Result Date: 11/11/2019   ECHOCARDIOGRAM REPORT   Patient Name:   [facility] Nelwyn Salisbury Date of Exam: 11/10/2019 Medical Rec #:  [id number]      Height:       66.0 in Accession #:    [id number]     Weight:       132.3 lb Date of Birth:  Apr 05, 1946      BSA:          1.68 m Patient Age:    59 years       BP:           137/58 mmHg Patient Gender: F              HR:           50 bpm. Exam Location:  [facility] Procedure: 2D Echo, Cardiac Doppler and Color Doppler Indications:     Stroke 434.91  History:         Patient has prior history of Echocardiogram examinations.                  Stroke; Risk Factors:Hypertension. Septal Repair:Amplazter.  PFO.  Sonographer:     Alyse Low Roar Referring Phys:  [id number] Soledad Gerlach NIU Diagnosing Phys: Nelva Bush MD IMPRESSIONS  1. Left ventricular ejection  fraction, by visual estimation, is 55 to 60%. The left ventricle has normal function. There is mildly increased left ventricular hypertrophy.  2. Elevated left atrial pressure.  3. Left ventricular diastolic parameters are consistent with Grade II diastolic dysfunction (pseudonormalization).  4. The left ventricle has no regional wall motion abnormalities.  5. Global right ventricle has low normal systolic function.The right ventricular size is normal. No increase in right ventricular wall thickness.  6. Left atrial size was normal.  7. Right atrial size was normal.  8. Mild mitral annular calcification.  9. Moderate calcification of the mitral valve leaflet(s). 10. Moderate thickening of the mitral valve leaflet(s). 11. The mitral valve is degenerative. Mild mitral valve regurgitation. No evidence of mitral stenosis. 12. There is an ill-defined echodensity adjacent to the posterior leaflet on the atrial side of the mitral valve. While this could represent reverberation artifact, there is the possibility of a calcified mass or partially flail posterior leaflet with calcification. This could be further evaluated by TEE, as clinically indicated. 13. The tricuspid valve is normal in structure. 14. The aortic valve was not well visualized. Aortic valve regurgitation is not visualized. No evidence of aortic valve sclerosis or stenosis. 15. The pulmonic valve was normal in structure. Pulmonic valve regurgitation is trivial. 16. Mildly dilated pulmonary artery. 17. TR signal is inadequate for assessing pulmonary artery systolic pressure. 18. The inferior vena cava is normal in size with greater than 50% respiratory variability, suggesting right atrial pressure of 3 mmHg. 19. The interatrial septum was not well visualized. FINDINGS  Left Ventricle: Left ventricular ejection fraction, by visual estimation, is 55 to 60%. The left ventricle has normal function. The left ventricle has no regional wall motion abnormalities. The  left ventricular internal cavity size was the left ventricle is normal in size. There is mildly increased left ventricular hypertrophy. Left ventricular diastolic parameters are consistent with Grade II diastolic dysfunction (pseudonormalization). Elevated left atrial pressure. Right Ventricle: The right ventricular size is normal. No increase in right ventricular wall thickness. Global RV systolic function is has low normal systolic function. Left Atrium: Left atrial size was normal in size. Right Atrium: Right atrial size was normal in size Pericardium: There is no evidence of pericardial effusion. Mitral Valve: The mitral valve is degenerative in appearance. There is moderate thickening of the mitral valve leaflet(s). There is moderate calcification of the mitral valve leaflet(s). Mild mitral annular calcification. Mild mitral valve regurgitation.  No evidence of mitral valve stenosis by observation. There is an ill-defined echodensity adjacent to the posterior leaflet on the atrial side of the mitral valve. While this could represent reverberation artifact, there is the possibility of a calcified  mass or partially flail posterior leaflet with calcification. This could be further evaluated by TEE, as clinically indicated. Tricuspid Valve: The tricuspid valve is normal in structure. Tricuspid valve regurgitation is trivial. Aortic Valve: The aortic valve was not well visualized. Aortic valve regurgitation is not visualized. The aortic valve is structurally normal, with no evidence of sclerosis or stenosis. Aortic valve mean gradient measures 3.0 mmHg. Aortic valve peak gradient measures 5.5 mmHg. Aortic valve area, by VTI measures 2.34 cm. Pulmonic Valve: The pulmonic valve was normal in structure. Pulmonic valve regurgitation is trivial. Pulmonic regurgitation is trivial. No evidence of pulmonic stenosis. Aorta: The aortic root is normal in size  and structure. Pulmonary Artery: The pulmonary artery is mildly  dilated. Venous: The inferior vena cava is normal in size with greater than 50% respiratory variability, suggesting right atrial pressure of 3 mmHg. IAS/Shunts: The interatrial septum was not well visualized.  LEFT VENTRICLE PLAX 2D LVIDd:         4.83 cm  Diastology LVIDs:         3.53 cm  LV e' lateral:   8.05 cm/s LV PW:         1.07 cm  LV E/e' lateral: 15.7 LV IVS:        1.26 cm  LV e' medial:    4.46 cm/s LVOT diam:     1.90 cm  LV E/e' medial:  28.3 LV SV:         57 ml LV SV Index:   34.21 LVOT Area:     2.84 cm  RIGHT VENTRICLE RV Basal diam:  2.84 cm RV Mid diam:    2.87 cm RV S prime:     11.00 cm/s LEFT ATRIUM             Index       RIGHT ATRIUM           Index LA diam:        4.00 cm 2.38 cm/m  RA Area:     11.30 cm LA Vol (A2C):   49.7 ml 29.63 ml/m RA Volume:   23.10 ml  13.77 ml/m LA Vol (A4C):   57.0 ml 33.98 ml/m LA Biplane Vol: 52.6 ml 31.35 ml/m  AORTIC VALVE                   PULMONIC VALVE AV Area (Vmax):    2.14 cm    PV Vmax:        1.09 m/s AV Area (Vmean):   2.29 cm    PV Peak grad:   4.8 mmHg AV Area (VTI):     2.34 cm    RVOT Peak grad: 4 mmHg AV Vmax:           117.00 cm/s AV Vmean:          75.300 cm/s AV VTI:            0.290 m AV Peak Grad:      5.5 mmHg AV Mean Grad:      3.0 mmHg LVOT Vmax:         88.30 cm/s LVOT Vmean:        60.700 cm/s LVOT VTI:          0.239 m LVOT/AV VTI ratio: 0.82  AORTA Ao Root diam: 2.70 cm MITRAL VALVE MV Area (PHT): 3.60 cm              SHUNTS MV PHT:        61.19 msec            Systemic VTI:  0.24 m MV Decel Time: 211 msec              Systemic Diam: 1.90 cm MV E velocity: 126.00 cm/s 103 cm/s MV A velocity: 107.00 cm/s 70.3 cm/s MV E/A ratio:  1.18        1.5  Nelva Bush MD Electronically signed by Nelva Bush MD Signature Date/Time: 11/11/2019/11:06:02 AM    Final    CUP PACEART REMOTE DEVICE CHECK  Result Date: 10/19/2019 [facility] summary report received. Battery status OK. Normal device function. No new symptom episodes,  tachy episodes, brady, or pause episodes. No new AF episodes. Monthly summary reports and ROV/PRN. SChancey   Time Spent in minutes  35   Jennye Runquist M.D on 11/12/2019 at 2:11 PM  Between 7am to 7pm - Pager - [phone]  After 7pm go to [email or web address] - password [facility]  [facility] -  Office  [phone]

## 2019-11-12 NOTE — Progress Notes (Signed)
Physical Therapy Treatment Patient Details Name: Erin Yates MRN: [id number] DOB: [birth year] Today's Date: 11/12/2019    History of Present Illness From MD notes: Pt is a 74 y.o. female with a history of diabetes, stroke hypertension, who presents with complaints of difficulty speaking x2 days.  Pt does have a history of stroke in the past but last was 4 years ago.  Apparently has deficits in rightward gaze, peripheral vision, and sensation in her right leg from prior stroke, complains of frontal headache.  Is able to answer yes/no questions.  MD head CT impression includes: Concern for acute infarct at the left temporal-occipital-parietal junction.    PT Comments    Pt presented with deficits in strength, transfers, gait, balance, and activity tolerance but made good progress towards goals.  Pt was noted to have significant improvement in word finding this session although deficits remain.  Pt was steady and confident with bed mobility and transfers and presented with increased amb tolerance although the pt's cadence was slow and cautious most notably during turns with mod lean on the RW that lessened with cues.  Pt will benefit from HHPT services upon discharge to safely address above deficits for decreased caregiver assistance and eventual return to PLOF.      Follow Up Recommendations  Home health PT;Supervision for mobility/OOB     Equipment Recommendations  None recommended by PT    Recommendations for Other Services       Precautions / Restrictions Precautions Precautions: Fall Restrictions Weight Bearing Restrictions: No    Mobility  Bed Mobility Overal bed mobility: Modified Independent             General bed mobility comments: Pt comes to sitting EOB with good confidence/control. Min increased time to perform.  Transfers Overall transfer level: Needs assistance Equipment used: Rolling walker (2 wheeled) Transfers: Sit to/from Stand Sit to Stand: Supervision          General transfer comment: Good eccentric and concentric control with transfers  Ambulation/Gait Ambulation/Gait assistance: Supervision Gait Distance (Feet): 150 Feet Assistive device: Rolling walker (2 wheeled) Gait Pattern/deviations: Step-through pattern;Decreased step length - right;Decreased step length - left;Trunk flexed Gait velocity: decreased   General Gait Details: Mod lean on the RW that improved with cues; slow cadence and cautious especially during 180 deg turns with cues for upright posture and amb closer to the [employer] Mobility    Modified Rankin (Stroke Patients Only)       Balance Overall balance assessment: Needs assistance Sitting-balance support: Feet supported Sitting balance-Leahy Scale: Normal     Standing balance support: During functional activity;No upper extremity supported;Bilateral upper extremity supported Standing balance-Leahy Scale: Good Standing balance comment: No LOB during amb with a RW or with static/dynamic standing activities without UE support                            Cognition Arousal/Alertness: Awake/alert Behavior During Therapy: WFL for tasks assessed/performed Overall Cognitive Status: Within Functional Limits for tasks assessed                                        Exercises Total Joint Exercises Ankle Circles/Pumps: AROM;Strengthening;Both;10 reps Quad Sets: Strengthening;Both;10 reps Gluteal Sets: Strengthening;Both;10 reps Hip ABduction/ADduction: AROM;Both;10 reps Straight Leg  Raises: AROM;Both;10 reps Long Arc Quad: Strengthening;Both;10 reps Knee Flexion: Strengthening;Both;10 reps Other Exercises Other Exercises: Static and dynamic standing balance activities without UE support    General Comments        Pertinent Vitals/Pain Pain Assessment: No/denies pain    Home Living                      Prior Function             PT Goals (current goals can now be found in the care plan section) Progress towards PT goals: Progressing toward goals    Frequency    Min 2X/week      PT Plan Current plan remains appropriate    Co-evaluation              AM-PAC PT "6 Clicks" Mobility   Outcome Measure  Help needed turning from your back to your side while in a flat bed without using bedrails?: None Help needed moving from lying on your back to sitting on the side of a flat bed without using bedrails?: None Help needed moving to and from a bed to a chair (including a wheelchair)?: A Little Help needed standing up from a chair using your arms (e.g., wheelchair or bedside chair)?: A Little Help needed to walk in hospital room?: A Little Help needed climbing 3-5 steps with a railing? : A Little 6 Click Score: 20    End of Session Equipment Utilized During Treatment: Gait belt Activity Tolerance: Patient tolerated treatment well Patient left: in bed;with call bell/phone within reach;with bed alarm set Nurse Communication: Mobility status PT Visit Diagnosis: Muscle weakness (generalized) (M62.81);Difficulty in walking, not elsewhere classified (R26.2);Other symptoms and signs involving the nervous system [id number])     Time: 1435-1500 PT Time Calculation (min) (ACUTE ONLY): 25 min  Charges:  $Gait Training: 8-22 mins $Therapeutic Exercise: 8-22 mins                     D. Scott Joakim Huesman PT, DPT 11/12/19, 4:32 PM

## 2019-11-12 NOTE — Anesthesia Preprocedure Evaluation (Addendum)
Anesthesia Evaluation  Patient identified by MRN, date of birth, ID band Patient awake    Reviewed: Allergy & Precautions, H&P , NPO status , Patient's Chart, lab work & pertinent test results, reviewed documented beta blocker date and time   History of Anesthesia Complications Negative for: history of anesthetic complications  Airway Mallampati: II  TM Distance: >3 FB Neck ROM: full    Dental  (+) Dental Advidsory Given   Pulmonary neg shortness of breath, COPD, neg recent URI, Current Smoker,    Pulmonary exam normal        Cardiovascular Exercise Tolerance: Good hypertension, (-) angina(-) Past MI and (-) Cardiac Stents + dysrhythmias Atrial Fibrillation + Valvular Problems/Murmurs (had a PFO, now repaired)      Neuro/Psych neg Seizures PSYCHIATRIC DISORDERS Anxiety Depression CVA, Residual Symptoms    GI/Hepatic Neg liver ROS, GERD  ,  Endo/Other  diabetesHypothyroidism   Renal/GU Renal disease  negative genitourinary   Musculoskeletal   Abdominal   Peds  Hematology  (+) Blood dyscrasia, anemia ,   Anesthesia Other Findings Past Medical History: No date: Anemia     Comment:  past  hx No date: Anxiety No date: Depression No date: Diabetes mellitus without complication (HCC) No date: Dysrhythmia     Comment:  prior to PFO correction No date: GERD (gastroesophageal reflux disease) No date: Headache     Comment:  before PFO closure No date: Hypercholesteremia No date: Hypertension No date: Hypothyroidism No date: 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 No date: PFO (patent foramen ovale)     Comment:  correction device placed 2010 No date: Right leg numbness     Comment:  lower leg- residual from CVA No date: Seasonal allergies No date: Stroke [facility])     Comment:  prior to PFO correction   Reproductive/Obstetrics negative OB ROS                             Anesthesia Physical Anesthesia Plan  ASA: III  Anesthesia Plan: General   Post-op Pain Management:    Induction: Intravenous  PONV Risk Score and Plan: 2 and Propofol infusion and TIVA  Airway Management Planned: Natural Airway and Nasal Cannula  Additional Equipment:   Intra-op Plan:   Post-operative Plan:   Informed Consent: I have reviewed the patients History and Physical, chart, labs and discussed the procedure including the risks, benefits and alternatives for the proposed anesthesia with the patient or authorized representative who has indicated his/her understanding and acceptance.     Dental Advisory Given  Plan Discussed with: Anesthesiologist, CRNA and Surgeon  Anesthesia Plan Comments:         Anesthesia Quick Evaluation

## 2019-11-12 NOTE — Progress Notes (Signed)
*  PRELIMINARY RESULTS* Echocardiogram Echocardiogram Transesophageal has been performed.  Sherrie Sport 11/12/2019, 10:56 AM

## 2019-11-12 NOTE — Progress Notes (Signed)
Subjective: Aphasia still present. S/p TEE this AM  Past Medical History:  Diagnosis Date  . Anemia    past  hx  . Anxiety   . Depression   . Diabetes mellitus without complication ([place])   . Dysrhythmia    prior to PFO correction  . GERD (gastroesophageal reflux disease)   . Headache    before PFO closure  . Hypercholesteremia   . Hypertension   . Hypothyroidism   . 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 PFO (patent foramen ovale)    correction device placed 2010  . Right leg numbness    lower leg- residual from CVA  . Seasonal allergies   . Stroke [facility])    prior to PFO correction    Past Surgical History:  Procedure Laterality Date  . ABDOMINAL HYSTERECTOMY    . CARDIAC CATHETERIZATION     [facility]  . CATARACT EXTRACTION W/PHACO Right 09/01/2015   Procedure: CATARACT EXTRACTION PHACO AND INTRAOCULAR LENS PLACEMENT (IOC);  Surgeon: Leandrew Koyanagi, MD;  Location: [place];  Service: Ophthalmology;  Laterality: Right;  DIABETIC - oral meds  . CATARACT EXTRACTION W/PHACO Left 10/20/2015   Procedure: CATARACT EXTRACTION PHACO AND INTRAOCULAR LENS PLACEMENT (IOC);  Surgeon: Leandrew Koyanagi, MD;  Location: [place];  Service: Ophthalmology;  Laterality: Left;  DIABETIC - oral meds  . EP IMPLANTABLE DEVICE N/A 04/04/2016   Procedure: Loop Recorder Insertion;  Surgeon: Deboraha Sprang, MD;  Location: [place] [facility];  Service: Cardiovascular;  Laterality: N/A;  . FOOT NEUROMA SURGERY Left   . NASAL SINUS SURGERY    . PATENT FORAMEN OVALE CLOSURE    . PATENT FORAMEN OVALE CLOSURE    . TEE WITHOUT CARDIOVERSION N/A 02/09/2016   Procedure: TRANSESOPHAGEAL ECHOCARDIOGRAM (TEE);  Surgeon: Adrian Prows, MD;  Location: [facility] ENDOSCOPY;  Service: Cardiovascular;  Laterality: N/A;    Family History  Problem Relation Age of Onset  . Dementia Father   . Breast cancer Sister 74    Social History:  reports that she has been smoking. She has a 55.00  pack-year smoking history. She has never used smokeless tobacco. She reports that she does not drink alcohol or use drugs.  Allergies  Allergen Reactions  . Alendronate Other (See Comments)  . Penicillins Rash    Also swelling and itching    Medications: I have reviewed the patient's current medications.  ROS:  difficult to obtain due to aphasia.   Physical Examination: Blood pressure (!) 134/55, pulse 69, temperature 98.4 F (36.9 C), temperature source Oral, resp. rate 16, height 5\' 6"  (1.676 m), weight 60 kg, SpO2 99 %.    Neurological Examination   Mental Status: Alert, oriented. Expressive aphasia.   Cranial Nerves: II: Discs flat bilaterally; Visual fields grossly normal, pupils equal, round, reactive to light and accommodation III,IV, VI: ptosis not present, extra-ocular motions intact bilaterally V,VII: smile symmetric, facial light touch sensation normal bilaterally VIII: hearing normal bilaterally XI: bilateral shoulder shrug XII: midline tongue extension Motor: Right : Upper extremity   4+/5    Left:     Upper extremity   4+/5  Lower extremity   4/5     Lower extremity   4/5 Tone and bulk:normal tone throughout; no atrophy noted Sensory: Pinprick and light touch intact throughout, bilaterally Deep Tendon Reflexes: 1+ and symmetric throughout Plantars: Right: downgoing   Left: downgoing Cerebellar: normal finger-to-nose      Laboratory Studies:   Basic Metabolic Panel:  Recent Labs  Lab 11/10/19 0828 11/11/19 0541 11/12/19 0402  NA 138 140 140  K 4.0 4.0 3.6  CL 106 107 109  CO2 21* 22 24  GLUCOSE 151* 75 106*  BUN 16 15 14   CREATININE 1.35* 1.23* 1.22*  CALCIUM 9.5 9.2 9.2    Liver Function Tests: Recent Labs  Lab 11/10/19 0828  AST 18  ALT 13  ALKPHOS 68  BILITOT 0.3  PROT 7.3  ALBUMIN 3.7   No results for input(s): LIPASE, AMYLASE in the last 168 hours. No results for input(s): AMMONIA in the last 168 hours.  CBC: Recent Labs   Lab 11/10/19 0828 11/11/19 0541 11/12/19 0402  WBC 7.5 6.1 5.8  NEUTROABS 4.3  --   --   HGB 12.3 11.7* 11.6*  HCT 35.3* 34.7* 34.8*  MCV 91.0 94.3 93.0  PLT 240 245 227    Cardiac Enzymes: No results for input(s): CKTOTAL, CKMB, CKMBINDEX, TROPONINI in the last 168 hours.  BNP: Invalid input(s): POCBNP  CBG: Recent Labs  Lab 11/11/19 1625 11/11/19 2115 11/11/19 2353 11/12/19 0347 11/12/19 0800  GLUCAP 147* 139* 105* 101* 158*    Microbiology: Results for orders placed or performed during the hospital encounter of 11/10/19  SARS CORONAVIRUS 2 (TAT 6-24 HRS) Nasopharyngeal Nasopharyngeal Swab     Status: None   Collection Time: 11/10/19 10:11 AM   Specimen: Nasopharyngeal Swab  Result Value Ref Range Status   SARS Coronavirus 2 NEGATIVE NEGATIVE Final    Comment: (NOTE) SARS-CoV-2 target nucleic acids are NOT DETECTED. The SARS-CoV-2 RNA is generally detectable in upper and lower respiratory specimens during the acute phase of infection. Negative results do not preclude SARS-CoV-2 infection, do not rule out co-infections with other pathogens, and should not be used as the sole basis for treatment or other patient management decisions. Negative results must be combined with clinical observations, patient history, and epidemiological information. The expected result is Negative. Fact Sheet for Patients: SugarRoll.be Fact Sheet for Healthcare Providers: https://www.woods-mathews.com/ This test is not yet approved or cleared by the [place] FDA and  has been authorized for detection and/or diagnosis of SARS-CoV-2 by FDA under an Emergency Use Authorization (EUA). This EUA will remain  in effect (meaning this test can be used) for the duration of the COVID-19 declaration under Section 56 4(b)(1) of the Act, 21 U.S.C. section 360bbb-3(b)(1), unless the authorization is terminated or revoked sooner. Performed at [facility], [place] [address] [place], [place] [address]     Coagulation Studies: Recent Labs    11/10/19 0828  LABPROT 12.4  INR 0.9    Urinalysis: No results for input(s): COLORURINE, LABSPEC, PHURINE, GLUCOSEU, HGBUR, BILIRUBINUR, KETONESUR, PROTEINUR, UROBILINOGEN, NITRITE, LEUKOCYTESUR in the last 168 hours.  Invalid input(s): APPERANCEUR  Lipid Panel:     Component Value Date/Time   CHOL 141 11/11/2019 0541   TRIG 379 (H) 11/11/2019 0541   HDL 33 (L) 11/11/2019 0541   CHOLHDL 4.3 11/11/2019 0541   VLDL 76 (H) 11/11/2019 0541   LDLCALC 32 11/11/2019 0541    HgbA1C:  Lab Results  Component Value Date   HGBA1C 6.5 (H) 11/10/2019    Urine Drug Screen:  No results found for: LABOPIA, COCAINSCRNUR, LABBENZ, AMPHETMU, THCU, LABBARB  Alcohol Level: No results for input(s): ETH in the last 168 hours.  Other results: EKG: normal EKG, normal sinus rhythm, unchanged from previous tracings.  Imaging: MR ANGIO HEAD WO CONTRAST  Result Date: 11/11/2019 CLINICAL DATA:  Abnormal  speech beginning 3 days ago. Dysarthria. EXAM: MRI HEAD WITHOUT CONTRAST MRA HEAD WITHOUT CONTRAST MRA NECK WITHOUT CONTRAST TECHNIQUE: Multiplanar, multiecho pulse sequences of the brain and surrounding structures were obtained without intravenous contrast. Angiographic images of the Circle of Willis were obtained using MRA technique without intravenous contrast. Angiographic images of the neck were obtained using MRA technique without intravenous contrast. Carotid stenosis measurements (when applicable) are obtained utilizing NASCET criteria, using the distal internal carotid diameter as the denominator. COMPARISON:  None. FINDINGS: MRI HEAD FINDINGS Brain: The diffusion-weighted images confirm an acute/subacute nonhemorrhagic infarct involving the left parietal lobe. Remote infarcts with volume loss are present in the occipital poles bilaterally. A smaller remote infarct is present in the right parietal  lobe. A remote cortical infarct is present in the left frontal operculum. Remote lacunar infarcts are present in the right basal ganglia. Mild periventricular white matter changes are present bilaterally. The ventricles are of normal size. No significant extraaxial fluid collection is present. White matter changes extend into the brainstem. Remote nonhemorrhagic lacunar infarcts are present in the cerebellum bilaterally. Vascular: Flow is present in the major intracranial arteries. Skull and upper cervical spine: The craniocervical junction is normal. Upper cervical spine is within normal limits. Marrow signal is unremarkable. Sinuses/Orbits: A polyp or mucous retention cyst is present in the left sphenoid sinus. Left maxillary antrostomy is noted. Left ethmoidectomies are noted. No other significant mucosal disease is present in the paranasal sinuses. Minimal fluid is present in the mastoid air cells bilaterally. No obstructing nasopharyngeal lesion is present. Bilateral lens replacements are noted. Globes and orbits are otherwise unremarkable. MRA HEAD FINDINGS There is significant signal loss just below the skull base in the internal carotid arteries bilaterally. This is likely artifactual. Mild atherosclerotic changes are present through the cavernous internal carotid arteries bilaterally. There is no significant stenosis. ICA termini are normal. High-grade stenosis is present in the proximal left A1 segment. Right A1 segment is small. M1 segments are normal bilaterally. MCA bifurcations are within normal limits. There is some attenuation of distal MCA branch vessels without a significant proximal stenosis or occlusion. The left vertebral artery is slightly dominant to the right. PICA origins are visualized and normal. Basilar artery is normal. Both posterior cerebral arteries originate from basilar tip. Moderate stenosis is present in the proximal right P2 segment. There is some attenuation of distal PCA branch  vessels bilaterally. MRA NECK FINDINGS There is a common origin of the left common carotid artery in the innominate artery. No significant flow disturbance is present at either carotid bifurcation. Flow is antegrade in the vertebral arteries bilaterally without significant stenosis IMPRESSION: 1. Acute/subacute nonhemorrhagic infarct involving the left parietal lobe. 2. Remote infarcts involving the occipital poles bilaterally, the left frontal operculum, and cerebellum. 3. Mild periventricular white matter disease bilaterally likely reflects the sequela of chronic microvascular ischemia. 4. MRA Circle of Willis demonstrates no significant proximal stenosis, aneurysm, or branch vessel occlusion. 5. High-grade stenosis of the proximal left A1 segment. 6. Moderate stenosis of the proximal right P2 segment. 7. Mild atherosclerotic changes at the cavernous internal carotid arteries bilaterally without other significant stenoses. 8. Signal loss in the internal carotid arteries bilaterally just below the skull base is likely artifactual. Electronically Signed   By: San Morelle M.D.   On: 11/11/2019 12:17   MR ANGIO NECK WO CONTRAST  Result Date: 11/11/2019 CLINICAL DATA:  Abnormal speech beginning 3 days ago. Dysarthria. EXAM: MRI HEAD WITHOUT CONTRAST MRA HEAD WITHOUT CONTRAST MRA  NECK WITHOUT CONTRAST TECHNIQUE: Multiplanar, multiecho pulse sequences of the brain and surrounding structures were obtained without intravenous contrast. Angiographic images of the Circle of Willis were obtained using MRA technique without intravenous contrast. Angiographic images of the neck were obtained using MRA technique without intravenous contrast. Carotid stenosis measurements (when applicable) are obtained utilizing NASCET criteria, using the distal internal carotid diameter as the denominator. COMPARISON:  None. FINDINGS: MRI HEAD FINDINGS Brain: The diffusion-weighted images confirm an acute/subacute nonhemorrhagic  infarct involving the left parietal lobe. Remote infarcts with volume loss are present in the occipital poles bilaterally. A smaller remote infarct is present in the right parietal lobe. A remote cortical infarct is present in the left frontal operculum. Remote lacunar infarcts are present in the right basal ganglia. Mild periventricular white matter changes are present bilaterally. The ventricles are of normal size. No significant extraaxial fluid collection is present. White matter changes extend into the brainstem. Remote nonhemorrhagic lacunar infarcts are present in the cerebellum bilaterally. Vascular: Flow is present in the major intracranial arteries. Skull and upper cervical spine: The craniocervical junction is normal. Upper cervical spine is within normal limits. Marrow signal is unremarkable. Sinuses/Orbits: A polyp or mucous retention cyst is present in the left sphenoid sinus. Left maxillary antrostomy is noted. Left ethmoidectomies are noted. No other significant mucosal disease is present in the paranasal sinuses. Minimal fluid is present in the mastoid air cells bilaterally. No obstructing nasopharyngeal lesion is present. Bilateral lens replacements are noted. Globes and orbits are otherwise unremarkable. MRA HEAD FINDINGS There is significant signal loss just below the skull base in the internal carotid arteries bilaterally. This is likely artifactual. Mild atherosclerotic changes are present through the cavernous internal carotid arteries bilaterally. There is no significant stenosis. ICA termini are normal. High-grade stenosis is present in the proximal left A1 segment. Right A1 segment is small. M1 segments are normal bilaterally. MCA bifurcations are within normal limits. There is some attenuation of distal MCA branch vessels without a significant proximal stenosis or occlusion. The left vertebral artery is slightly dominant to the right. PICA origins are visualized and normal. Basilar artery  is normal. Both posterior cerebral arteries originate from basilar tip. Moderate stenosis is present in the proximal right P2 segment. There is some attenuation of distal PCA branch vessels bilaterally. MRA NECK FINDINGS There is a common origin of the left common carotid artery in the innominate artery. No significant flow disturbance is present at either carotid bifurcation. Flow is antegrade in the vertebral arteries bilaterally without significant stenosis IMPRESSION: 1. Acute/subacute nonhemorrhagic infarct involving the left parietal lobe. 2. Remote infarcts involving the occipital poles bilaterally, the left frontal operculum, and cerebellum. 3. Mild periventricular white matter disease bilaterally likely reflects the sequela of chronic microvascular ischemia. 4. MRA Circle of Willis demonstrates no significant proximal stenosis, aneurysm, or branch vessel occlusion. 5. High-grade stenosis of the proximal left A1 segment. 6. Moderate stenosis of the proximal right P2 segment. 7. Mild atherosclerotic changes at the cavernous internal carotid arteries bilaterally without other significant stenoses. 8. Signal loss in the internal carotid arteries bilaterally just below the skull base is likely artifactual. Electronically Signed   By: San Morelle M.D.   On: 11/11/2019 12:17   MR BRAIN WO CONTRAST  Result Date: 11/11/2019 CLINICAL DATA:  Abnormal speech beginning 3 days ago. Dysarthria. EXAM: MRI HEAD WITHOUT CONTRAST MRA HEAD WITHOUT CONTRAST MRA NECK WITHOUT CONTRAST TECHNIQUE: Multiplanar, multiecho pulse sequences of the brain and surrounding structures were obtained without  intravenous contrast. Angiographic images of the Circle of Willis were obtained using MRA technique without intravenous contrast. Angiographic images of the neck were obtained using MRA technique without intravenous contrast. Carotid stenosis measurements (when applicable) are obtained utilizing NASCET criteria, using the  distal internal carotid diameter as the denominator. COMPARISON:  None. FINDINGS: MRI HEAD FINDINGS Brain: The diffusion-weighted images confirm an acute/subacute nonhemorrhagic infarct involving the left parietal lobe. Remote infarcts with volume loss are present in the occipital poles bilaterally. A smaller remote infarct is present in the right parietal lobe. A remote cortical infarct is present in the left frontal operculum. Remote lacunar infarcts are present in the right basal ganglia. Mild periventricular white matter changes are present bilaterally. The ventricles are of normal size. No significant extraaxial fluid collection is present. White matter changes extend into the brainstem. Remote nonhemorrhagic lacunar infarcts are present in the cerebellum bilaterally. Vascular: Flow is present in the major intracranial arteries. Skull and upper cervical spine: The craniocervical junction is normal. Upper cervical spine is within normal limits. Marrow signal is unremarkable. Sinuses/Orbits: A polyp or mucous retention cyst is present in the left sphenoid sinus. Left maxillary antrostomy is noted. Left ethmoidectomies are noted. No other significant mucosal disease is present in the paranasal sinuses. Minimal fluid is present in the mastoid air cells bilaterally. No obstructing nasopharyngeal lesion is present. Bilateral lens replacements are noted. Globes and orbits are otherwise unremarkable. MRA HEAD FINDINGS There is significant signal loss just below the skull base in the internal carotid arteries bilaterally. This is likely artifactual. Mild atherosclerotic changes are present through the cavernous internal carotid arteries bilaterally. There is no significant stenosis. ICA termini are normal. High-grade stenosis is present in the proximal left A1 segment. Right A1 segment is small. M1 segments are normal bilaterally. MCA bifurcations are within normal limits. There is some attenuation of distal MCA branch  vessels without a significant proximal stenosis or occlusion. The left vertebral artery is slightly dominant to the right. PICA origins are visualized and normal. Basilar artery is normal. Both posterior cerebral arteries originate from basilar tip. Moderate stenosis is present in the proximal right P2 segment. There is some attenuation of distal PCA branch vessels bilaterally. MRA NECK FINDINGS There is a common origin of the left common carotid artery in the innominate artery. No significant flow disturbance is present at either carotid bifurcation. Flow is antegrade in the vertebral arteries bilaterally without significant stenosis IMPRESSION: 1. Acute/subacute nonhemorrhagic infarct involving the left parietal lobe. 2. Remote infarcts involving the occipital poles bilaterally, the left frontal operculum, and cerebellum. 3. Mild periventricular white matter disease bilaterally likely reflects the sequela of chronic microvascular ischemia. 4. MRA Circle of Willis demonstrates no significant proximal stenosis, aneurysm, or branch vessel occlusion. 5. High-grade stenosis of the proximal left A1 segment. 6. Moderate stenosis of the proximal right P2 segment. 7. Mild atherosclerotic changes at the cavernous internal carotid arteries bilaterally without other significant stenoses. 8. Signal loss in the internal carotid arteries bilaterally just below the skull base is likely artifactual. Electronically Signed   By: San Morelle M.D.   On: 11/11/2019 12:17   US Carotid Bilateral (at [facility] and [facility] only)  Result Date: 11/10/2019 CLINICAL DATA:  Cerebral infarction. History hypertension, hyperlipidemia and coronary artery disease. EXAM: BILATERAL CAROTID DUPLEX ULTRASOUND TECHNIQUE: Pearline Cables scale imaging, color Doppler and duplex ultrasound were performed of bilateral carotid and vertebral arteries in the neck. COMPARISON:  11/19/2007 FINDINGS: Criteria: Quantification of carotid stenosis is based on velocity  parameters that correlate the residual internal carotid diameter with NASCET-based stenosis levels, using the diameter of the distal internal carotid lumen as the denominator for stenosis measurement. The following velocity measurements were obtained: RIGHT ICA:  85/20 cm/sec CCA:  [id number] cm/sec SYSTOLIC ICA/CCA RATIO:  1.5 ECA:  76 cm/sec LEFT ICA:  45/11 cm/sec CCA:  [id number] cm/sec SYSTOLIC ICA/CCA RATIO:  1.1 ECA:  68 cm/sec RIGHT CAROTID ARTERY: Intimal thickening in the common carotid artery and carotid bulb. There is a mild amount of noncalcified plaque at the level of the carotid bulb. Minimal partially calcified plaque in the proximal right ICA. No evidence of significant stenosis with estimated right ICA stenosis of less than 50%. RIGHT VERTEBRAL ARTERY: Antegrade flow with normal waveform and velocity. LEFT CAROTID ARTERY: Mild calcified plaque at the level of the left ICA and left carotid bulb. No significant stenosis identified with estimated left ICA stenosis of less than 50%. LEFT VERTEBRAL ARTERY: Antegrade flow with normal waveform and velocity. IMPRESSION: Mild plaque at the level of both carotid bulbs and proximal internal carotid arteries without significant carotid stenosis identified. Bilateral estimated ICA stenoses of less than 50%. Electronically Signed   By: Aletta Edouard M.D.   On: 11/10/2019 14:34   ECHOCARDIOGRAM COMPLETE  Result Date: 11/11/2019   ECHOCARDIOGRAM REPORT   Patient Name:   Erin Yates Date of Exam: 11/10/2019 Medical Rec #:  [id number]      Height:       66.0 in Accession #:    [id number]     Weight:       132.3 lb Date of Birth:  05-13-1946      BSA:          1.68 m Patient Age:    15 years       BP:           137/58 mmHg Patient Gender: F              HR:           50 bpm. Exam Location:  [facility] Procedure: 2D Echo, Cardiac Doppler and Color Doppler Indications:     Stroke 434.91  History:         Patient has prior history of Echocardiogram examinations.                   Stroke; Risk Factors:Hypertension. Septal Repair:Amplazter.                  PFO.  Sonographer:     Alyse Low Roar Referring Phys:  Unknown Foley NIU Diagnosing Phys: Nelva Bush MD IMPRESSIONS  1. Left ventricular ejection fraction, by visual estimation, is 55 to 60%. The left ventricle has normal function. There is mildly increased left ventricular hypertrophy.  2. Elevated left atrial pressure.  3. Left ventricular diastolic parameters are consistent with Grade II diastolic dysfunction (pseudonormalization).  4. The left ventricle has no regional wall motion abnormalities.  5. Global right ventricle has low normal systolic function.The right ventricular size is normal. No increase in right ventricular wall thickness.  6. Left atrial size was normal.  7. Right atrial size was normal.  8. Mild mitral annular calcification.  9. Moderate calcification of the mitral valve leaflet(s). 10. Moderate thickening of the mitral valve leaflet(s). 11. The mitral valve is degenerative. Mild mitral valve regurgitation. No evidence of mitral stenosis. 12. There is an ill-defined echodensity adjacent to the posterior leaflet on the atrial side of the mitral valve.  While this could represent reverberation artifact, there is the possibility of a calcified mass or partially flail posterior leaflet with calcification. This could be further evaluated by TEE, as clinically indicated. 13. The tricuspid valve is normal in structure. 14. The aortic valve was not well visualized. Aortic valve regurgitation is not visualized. No evidence of aortic valve sclerosis or stenosis. 15. The pulmonic valve was normal in structure. Pulmonic valve regurgitation is trivial. 16. Mildly dilated pulmonary artery. 17. TR signal is inadequate for assessing pulmonary artery systolic pressure. 18. The inferior vena cava is normal in size with greater than 50% respiratory variability, suggesting right atrial pressure of 3 mmHg. 19. The interatrial septum was  not well visualized. FINDINGS  Left Ventricle: Left ventricular ejection fraction, by visual estimation, is 55 to 60%. The left ventricle has normal function. The left ventricle has no regional wall motion abnormalities. The left ventricular internal cavity size was the left ventricle is normal in size. There is mildly increased left ventricular hypertrophy. Left ventricular diastolic parameters are consistent with Grade II diastolic dysfunction (pseudonormalization). Elevated left atrial pressure. Right Ventricle: The right ventricular size is normal. No increase in right ventricular wall thickness. Global RV systolic function is has low normal systolic function. Left Atrium: Left atrial size was normal in size. Right Atrium: Right atrial size was normal in size Pericardium: There is no evidence of pericardial effusion. Mitral Valve: The mitral valve is degenerative in appearance. There is moderate thickening of the mitral valve leaflet(s). There is moderate calcification of the mitral valve leaflet(s). Mild mitral annular calcification. Mild mitral valve regurgitation.  No evidence of mitral valve stenosis by observation. There is an ill-defined echodensity adjacent to the posterior leaflet on the atrial side of the mitral valve. While this could represent reverberation artifact, there is the possibility of a calcified  mass or partially flail posterior leaflet with calcification. This could be further evaluated by TEE, as clinically indicated. Tricuspid Valve: The tricuspid valve is normal in structure. Tricuspid valve regurgitation is trivial. Aortic Valve: The aortic valve was not well visualized. Aortic valve regurgitation is not visualized. The aortic valve is structurally normal, with no evidence of sclerosis or stenosis. Aortic valve mean gradient measures 3.0 mmHg. Aortic valve peak gradient measures 5.5 mmHg. Aortic valve area, by VTI measures 2.34 cm. Pulmonic Valve: The pulmonic valve was normal in  structure. Pulmonic valve regurgitation is trivial. Pulmonic regurgitation is trivial. No evidence of pulmonic stenosis. Aorta: The aortic root is normal in size and structure. Pulmonary Artery: The pulmonary artery is mildly dilated. Venous: The inferior vena cava is normal in size with greater than 50% respiratory variability, suggesting right atrial pressure of 3 mmHg. IAS/Shunts: The interatrial septum was not well visualized.  LEFT VENTRICLE PLAX 2D LVIDd:         4.83 cm  Diastology LVIDs:         3.53 cm  LV e' lateral:   8.05 cm/s LV PW:         1.07 cm  LV E/e' lateral: 15.7 LV IVS:        1.26 cm  LV e' medial:    4.46 cm/s LVOT diam:     1.90 cm  LV E/e' medial:  28.3 LV SV:         57 ml LV SV Index:   34.21 LVOT Area:     2.84 cm  RIGHT VENTRICLE RV Basal diam:  2.84 cm RV Mid diam:    2.87 cm  RV S prime:     11.00 cm/s LEFT ATRIUM             Index       RIGHT ATRIUM           Index LA diam:        4.00 cm 2.38 cm/m  RA Area:     11.30 cm LA Vol (A2C):   49.7 ml 29.63 ml/m RA Volume:   23.10 ml  13.77 ml/m LA Vol (A4C):   57.0 ml 33.98 ml/m LA Biplane Vol: 52.6 ml 31.35 ml/m  AORTIC VALVE                   PULMONIC VALVE AV Area (Vmax):    2.14 cm    PV Vmax:        1.09 m/s AV Area (Vmean):   2.29 cm    PV Peak grad:   4.8 mmHg AV Area (VTI):     2.34 cm    RVOT Peak grad: 4 mmHg AV Vmax:           117.00 cm/s AV Vmean:          75.300 cm/s AV VTI:            0.290 m AV Peak Grad:      5.5 mmHg AV Mean Grad:      3.0 mmHg LVOT Vmax:         88.30 cm/s LVOT Vmean:        60.700 cm/s LVOT VTI:          0.239 m LVOT/AV VTI ratio: 0.82  AORTA Ao Root diam: 2.70 cm MITRAL VALVE MV Area (PHT): 3.60 cm              SHUNTS MV PHT:        61.19 msec            Systemic VTI:  0.24 m MV Decel Time: 211 msec              Systemic Diam: 1.90 cm MV E velocity: 126.00 cm/s 103 cm/s MV A velocity: 107.00 cm/s 70.3 cm/s MV E/A ratio:  1.18        1.5  Harrell Gave End MD Electronically signed by Nelva Bush MD Signature Date/Time: 11/11/2019/11:06:02 AM    Final      Assessment/Plan:   74 y.o. female with a history of diabetes, stroke hypertension, who presents with complaints of difficulty speaking since Saturday. No other clear focality on examination. Not TPA/CTA candidate as was outside the window.  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   Patient has a loop recorder. 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   11/12/2019, 11:40 AM

## 2019-11-12 NOTE — Progress Notes (Signed)
Transesophageal Echocardiogram :  Indication: CVA, mitral valve disorder, PFO Requesting/ordering  physician: Karenz/Gephart  Procedure: Benzocaine spray x2 and 2 mls x 2 of viscous lidocaine were given orally to provide local anesthesia to the oropharynx. The patient was positioned supine on the left side, bite block provided. The patient was moderately sedated with the doses of versed and fentanyl as detailed below.  Using digital technique an omniplane probe was advanced into the distal esophagus without incident.   Moderate sedation: Sedation used:  propofol by anesthesia   See report in EPIC  for complete details: In brief, transgastric imaging revealed normal LV function with no RWMAs and no mural apical thrombus.  .  Estimated ejection fraction was 55%.  Right sided cardiac chambers were normal with no evidence of pulmonary hypertension.  Imaging of the septum showed no ASD or VSD Bubble study was negative for shunt 2D and color flow confirmed no PFO  The LA was well visualized in orthogonal views.  There was no spontaneous contrast and no thrombus in the LA and LA appendage   The descending thoracic aorta had mild  mural aortic debris with no evidence of aneurysmal dilation or disection  Mitral valve with mild eccentric MR Heavily calcified echodensity adjacent to the posterior leaflet on the atrial side   Ida Rogue 11/12/2019 10:49 AM

## 2019-11-12 NOTE — Transfer of Care (Signed)
Immediate Anesthesia Transfer of Care Note  Patient: Erin Yates  Procedure(s) Performed: TRANSESOPHAGEAL ECHOCARDIOGRAM (TEE) (N/A )  Patient Location: PACU  Anesthesia Type:General  Level of Consciousness: drowsy and patient cooperative  Airway & Oxygen Therapy: Patient Spontanous Breathing and Patient connected to nasal cannula oxygen  Post-op Assessment: Report given to RN and Post -op Vital signs reviewed and stable  Post vital signs: Reviewed and stable  Last Vitals:  Vitals Value Taken Time  BP 134/55 11/12/19 1052  Temp    Pulse 56 11/12/19 1053  Resp 12 11/12/19 1053  SpO2 97 % 11/12/19 1053  Vitals shown include unvalidated device data.  Last Pain:  Vitals:   11/12/19 0830  TempSrc: Oral  PainSc: 0-No pain         Complications: No anesthetic complications

## 2019-11-12 NOTE — Progress Notes (Signed)
Progress Note  Patient Name: Erin Yates Date of Encounter: 11/12/2019  Primary Cardiologist: [facility]-Dr. Saunders Revel, [name]  Subjective   TEE this AM, no PFO, bubble study negative, Normal EF No LA or LA appendage thrombus Calcified opacity next to/part of posterior mitral valve, mild MR   Inpatient Medications    Scheduled Meds: . [MAR Hold] amLODipine  10 mg Oral Daily  . [MAR Hold] aspirin EC  81 mg Oral Daily  . [MAR Hold] buPROPion  150 mg Oral BID  . butamben-tetracaine-benzocaine      . [MAR Hold] cholecalciferol  1,000 Units Oral Daily  . [MAR Hold] clopidogrel  75 mg Oral Daily  . [MAR Hold] enoxaparin (LOVENOX) injection  40 mg Subcutaneous Q24H  . [MAR Hold] insulin aspart  0-5 Units Subcutaneous QHS  . [MAR Hold] insulin aspart  0-9 Units Subcutaneous TID WC  . [MAR Hold] levothyroxine  50 mcg Oral Q0600  . lidocaine      . [MAR Hold] losartan  100 mg Oral Daily  . [MAR Hold] metoprolol succinate  200 mg Oral Daily  . [MAR Hold] nicotine  21 mg Transdermal Daily  . [MAR Hold] pantoprazole  40 mg Oral BID  . sodium chloride flush      . [MAR Hold] traZODone  50 mg Oral QHS  . [MAR Hold] venlafaxine XR  225 mg Oral Q breakfast  . [MAR Hold] vitamin B-12  500 mcg Oral Daily   Continuous Infusions: . sodium chloride    . sodium chloride 120 mL/hr at 11/12/19 1050   PRN Meds: [MAR Hold] acetaminophen **OR** [MAR Hold] acetaminophen (TYLENOL) oral liquid 160 mg/5 mL **OR** [MAR Hold] acetaminophen, [MAR Hold] ALPRAZolam, [MAR Hold] fluticasone, [MAR Hold] ondansetron (ZOFRAN) IV, [MAR Hold] senna-docusate   Vital Signs    Vitals:   11/12/19 0758 11/12/19 0830 11/12/19 1019 11/12/19 1052  BP: (!) 146/83 (!) 153/87 131/76 (!) 134/55  Pulse: 80 69    Resp: 17 14  16   Temp: 98.2 F (36.8 C) 98.4 F (36.9 C)    TempSrc: Oral Oral    SpO2: 97% 98%  99%  Weight:      Height:        Intake/Output Summary (Last 24 hours) at 11/12/2019 1054 Last data filed at 11/12/2019  1050 Gross per 24 hour  Intake 120 ml  Output --  Net 120 ml   Last 3 Weights 11/10/2019 08/06/2019 03/11/2019  Weight (lbs) 132 lb 4.4 oz 134 lb 134 lb  Weight (kg) 60 kg 60.782 kg 60.782 kg      Telemetry    NSR - Personally Reviewed  ECG    - Personally Reviewed  Physical Exam   GEN: No acute distress.   Neck: No JVD Cardiac: RRR, no murmurs, rubs, or gallops.  Respiratory: Clear to auscultation bilaterally. GI: Soft, nontender, non-distended  MS: No edema; No deformity. Neuro:  Nonfocal  Psych: Normal affect   Labs    High Sensitivity Troponin:  No results for input(s): TROPONINIHS in the last 720 hours.    Chemistry Recent Labs  Lab 11/10/19 0828 11/11/19 0541 11/12/19 0402  NA 138 140 140  K 4.0 4.0 3.6  CL 106 107 109  CO2 21* 22 24  GLUCOSE 151* 75 106*  BUN 16 15 14   CREATININE 1.35* 1.23* 1.22*  CALCIUM 9.5 9.2 9.2  PROT 7.3  --   --   ALBUMIN 3.7  --   --   AST 18  --   --  ALT 13  --   --   ALKPHOS 68  --   --   BILITOT 0.3  --   --   GFRNONAA 39* 43* 44*  GFRAA 45* 50* 51*  ANIONGAP 11 11 7      Hematology Recent Labs  Lab 11/10/19 0828 11/11/19 0541 11/12/19 0402  WBC 7.5 6.1 5.8  RBC 3.88 3.68* 3.74*  HGB 12.3 11.7* 11.6*  HCT 35.3* 34.7* 34.8*  MCV 91.0 94.3 93.0  MCH 31.7 31.8 31.0  MCHC 34.8 33.7 33.3  RDW 13.0 13.0 12.9  PLT 240 245 227    BNPNo results for input(s): BNP, PROBNP in the last 168 hours.   DDimer No results for input(s): DDIMER in the last 168 hours.   Radiology    MR ANGIO HEAD WO CONTRAST  Result Date: 11/11/2019 CLINICAL DATA:  Abnormal speech beginning 3 days ago. Dysarthria. EXAM: MRI HEAD WITHOUT CONTRAST MRA HEAD WITHOUT CONTRAST MRA NECK WITHOUT CONTRAST TECHNIQUE: Multiplanar, multiecho pulse sequences of the brain and surrounding structures were obtained without intravenous contrast. Angiographic images of the Circle of Willis were obtained using MRA technique without intravenous contrast.  Angiographic images of the neck were obtained using MRA technique without intravenous contrast. Carotid stenosis measurements (when applicable) are obtained utilizing NASCET criteria, using the distal internal carotid diameter as the denominator. COMPARISON:  None. FINDINGS: MRI HEAD FINDINGS Brain: The diffusion-weighted images confirm an acute/subacute nonhemorrhagic infarct involving the left parietal lobe. Remote infarcts with volume loss are present in the occipital poles bilaterally. A smaller remote infarct is present in the right parietal lobe. A remote cortical infarct is present in the left frontal operculum. Remote lacunar infarcts are present in the right basal ganglia. Mild periventricular white matter changes are present bilaterally. The ventricles are of normal size. No significant extraaxial fluid collection is present. White matter changes extend into the brainstem. Remote nonhemorrhagic lacunar infarcts are present in the cerebellum bilaterally. Vascular: Flow is present in the major intracranial arteries. Skull and upper cervical spine: The craniocervical junction is normal. Upper cervical spine is within normal limits. Marrow signal is unremarkable. Sinuses/Orbits: A polyp or mucous retention cyst is present in the left sphenoid sinus. Left maxillary antrostomy is noted. Left ethmoidectomies are noted. No other significant mucosal disease is present in the paranasal sinuses. Minimal fluid is present in the mastoid air cells bilaterally. No obstructing nasopharyngeal lesion is present. Bilateral lens replacements are noted. Globes and orbits are otherwise unremarkable. MRA HEAD FINDINGS There is significant signal loss just below the skull base in the internal carotid arteries bilaterally. This is likely artifactual. Mild atherosclerotic changes are present through the cavernous internal carotid arteries bilaterally. There is no significant stenosis. ICA termini are normal. High-grade stenosis is  present in the proximal left A1 segment. Right A1 segment is small. M1 segments are normal bilaterally. MCA bifurcations are within normal limits. There is some attenuation of distal MCA branch vessels without a significant proximal stenosis or occlusion. The left vertebral artery is slightly dominant to the right. PICA origins are visualized and normal. Basilar artery is normal. Both posterior cerebral arteries originate from basilar tip. Moderate stenosis is present in the proximal right P2 segment. There is some attenuation of distal PCA branch vessels bilaterally. MRA NECK FINDINGS There is a common origin of the left common carotid artery in the innominate artery. No significant flow disturbance is present at either carotid bifurcation. Flow is antegrade in the vertebral arteries bilaterally without significant stenosis IMPRESSION: 1.  Acute/subacute nonhemorrhagic infarct involving the left parietal lobe. 2. Remote infarcts involving the occipital poles bilaterally, the left frontal operculum, and cerebellum. 3. Mild periventricular white matter disease bilaterally likely reflects the sequela of chronic microvascular ischemia. 4. MRA Circle of Willis demonstrates no significant proximal stenosis, aneurysm, or branch vessel occlusion. 5. High-grade stenosis of the proximal left A1 segment. 6. Moderate stenosis of the proximal right P2 segment. 7. Mild atherosclerotic changes at the cavernous internal carotid arteries bilaterally without other significant stenoses. 8. Signal loss in the internal carotid arteries bilaterally just below the skull base is likely artifactual. Electronically Signed   By: San Morelle M.D.   On: 11/11/2019 12:17   MR ANGIO NECK WO CONTRAST  Result Date: 11/11/2019 CLINICAL DATA:  Abnormal speech beginning 3 days ago. Dysarthria. EXAM: MRI HEAD WITHOUT CONTRAST MRA HEAD WITHOUT CONTRAST MRA NECK WITHOUT CONTRAST TECHNIQUE: Multiplanar, multiecho pulse sequences of the brain  and surrounding structures were obtained without intravenous contrast. Angiographic images of the Circle of Willis were obtained using MRA technique without intravenous contrast. Angiographic images of the neck were obtained using MRA technique without intravenous contrast. Carotid stenosis measurements (when applicable) are obtained utilizing NASCET criteria, using the distal internal carotid diameter as the denominator. COMPARISON:  None. FINDINGS: MRI HEAD FINDINGS Brain: The diffusion-weighted images confirm an acute/subacute nonhemorrhagic infarct involving the left parietal lobe. Remote infarcts with volume loss are present in the occipital poles bilaterally. A smaller remote infarct is present in the right parietal lobe. A remote cortical infarct is present in the left frontal operculum. Remote lacunar infarcts are present in the right basal ganglia. Mild periventricular white matter changes are present bilaterally. The ventricles are of normal size. No significant extraaxial fluid collection is present. White matter changes extend into the brainstem. Remote nonhemorrhagic lacunar infarcts are present in the cerebellum bilaterally. Vascular: Flow is present in the major intracranial arteries. Skull and upper cervical spine: The craniocervical junction is normal. Upper cervical spine is within normal limits. Marrow signal is unremarkable. Sinuses/Orbits: A polyp or mucous retention cyst is present in the left sphenoid sinus. Left maxillary antrostomy is noted. Left ethmoidectomies are noted. No other significant mucosal disease is present in the paranasal sinuses. Minimal fluid is present in the mastoid air cells bilaterally. No obstructing nasopharyngeal lesion is present. Bilateral lens replacements are noted. Globes and orbits are otherwise unremarkable. MRA HEAD FINDINGS There is significant signal loss just below the skull base in the internal carotid arteries bilaterally. This is likely artifactual. Mild  atherosclerotic changes are present through the cavernous internal carotid arteries bilaterally. There is no significant stenosis. ICA termini are normal. High-grade stenosis is present in the proximal left A1 segment. Right A1 segment is small. M1 segments are normal bilaterally. MCA bifurcations are within normal limits. There is some attenuation of distal MCA branch vessels without a significant proximal stenosis or occlusion. The left vertebral artery is slightly dominant to the right. PICA origins are visualized and normal. Basilar artery is normal. Both posterior cerebral arteries originate from basilar tip. Moderate stenosis is present in the proximal right P2 segment. There is some attenuation of distal PCA branch vessels bilaterally. MRA NECK FINDINGS There is a common origin of the left common carotid artery in the innominate artery. No significant flow disturbance is present at either carotid bifurcation. Flow is antegrade in the vertebral arteries bilaterally without significant stenosis IMPRESSION: 1. Acute/subacute nonhemorrhagic infarct involving the left parietal lobe. 2. Remote infarcts involving the occipital poles bilaterally,  the left frontal operculum, and cerebellum. 3. Mild periventricular white matter disease bilaterally likely reflects the sequela of chronic microvascular ischemia. 4. MRA Circle of Willis demonstrates no significant proximal stenosis, aneurysm, or branch vessel occlusion. 5. High-grade stenosis of the proximal left A1 segment. 6. Moderate stenosis of the proximal right P2 segment. 7. Mild atherosclerotic changes at the cavernous internal carotid arteries bilaterally without other significant stenoses. 8. Signal loss in the internal carotid arteries bilaterally just below the skull base is likely artifactual. Electronically Signed   By: San Morelle M.D.   On: 11/11/2019 12:17   MR BRAIN WO CONTRAST  Result Date: 11/11/2019 CLINICAL DATA:  Abnormal speech beginning  3 days ago. Dysarthria. EXAM: MRI HEAD WITHOUT CONTRAST MRA HEAD WITHOUT CONTRAST MRA NECK WITHOUT CONTRAST TECHNIQUE: Multiplanar, multiecho pulse sequences of the brain and surrounding structures were obtained without intravenous contrast. Angiographic images of the Circle of Willis were obtained using MRA technique without intravenous contrast. Angiographic images of the neck were obtained using MRA technique without intravenous contrast. Carotid stenosis measurements (when applicable) are obtained utilizing NASCET criteria, using the distal internal carotid diameter as the denominator. COMPARISON:  None. FINDINGS: MRI HEAD FINDINGS Brain: The diffusion-weighted images confirm an acute/subacute nonhemorrhagic infarct involving the left parietal lobe. Remote infarcts with volume loss are present in the occipital poles bilaterally. A smaller remote infarct is present in the right parietal lobe. A remote cortical infarct is present in the left frontal operculum. Remote lacunar infarcts are present in the right basal ganglia. Mild periventricular white matter changes are present bilaterally. The ventricles are of normal size. No significant extraaxial fluid collection is present. White matter changes extend into the brainstem. Remote nonhemorrhagic lacunar infarcts are present in the cerebellum bilaterally. Vascular: Flow is present in the major intracranial arteries. Skull and upper cervical spine: The craniocervical junction is normal. Upper cervical spine is within normal limits. Marrow signal is unremarkable. Sinuses/Orbits: A polyp or mucous retention cyst is present in the left sphenoid sinus. Left maxillary antrostomy is noted. Left ethmoidectomies are noted. No other significant mucosal disease is present in the paranasal sinuses. Minimal fluid is present in the mastoid air cells bilaterally. No obstructing nasopharyngeal lesion is present. Bilateral lens replacements are noted. Globes and orbits are  otherwise unremarkable. MRA HEAD FINDINGS There is significant signal loss just below the skull base in the internal carotid arteries bilaterally. This is likely artifactual. Mild atherosclerotic changes are present through the cavernous internal carotid arteries bilaterally. There is no significant stenosis. ICA termini are normal. High-grade stenosis is present in the proximal left A1 segment. Right A1 segment is small. M1 segments are normal bilaterally. MCA bifurcations are within normal limits. There is some attenuation of distal MCA branch vessels without a significant proximal stenosis or occlusion. The left vertebral artery is slightly dominant to the right. PICA origins are visualized and normal. Basilar artery is normal. Both posterior cerebral arteries originate from basilar tip. Moderate stenosis is present in the proximal right P2 segment. There is some attenuation of distal PCA branch vessels bilaterally. MRA NECK FINDINGS There is a common origin of the left common carotid artery in the innominate artery. No significant flow disturbance is present at either carotid bifurcation. Flow is antegrade in the vertebral arteries bilaterally without significant stenosis IMPRESSION: 1. Acute/subacute nonhemorrhagic infarct involving the left parietal lobe. 2. Remote infarcts involving the occipital poles bilaterally, the left frontal operculum, and cerebellum. 3. Mild periventricular white matter disease bilaterally likely reflects the sequela  of chronic microvascular ischemia. 4. MRA Circle of Willis demonstrates no significant proximal stenosis, aneurysm, or branch vessel occlusion. 5. High-grade stenosis of the proximal left A1 segment. 6. Moderate stenosis of the proximal right P2 segment. 7. Mild atherosclerotic changes at the cavernous internal carotid arteries bilaterally without other significant stenoses. 8. Signal loss in the internal carotid arteries bilaterally just below the skull base is likely  artifactual. Electronically Signed   By: San Morelle M.D.   On: 11/11/2019 12:17   US Carotid Bilateral (at [facility] and [facility] only)  Result Date: 11/10/2019 CLINICAL DATA:  Cerebral infarction. History hypertension, hyperlipidemia and coronary artery disease. EXAM: BILATERAL CAROTID DUPLEX ULTRASOUND TECHNIQUE: Pearline Cables scale imaging, color Doppler and duplex ultrasound were performed of bilateral carotid and vertebral arteries in the neck. COMPARISON:  11/19/2007 FINDINGS: Criteria: Quantification of carotid stenosis is based on velocity parameters that correlate the residual internal carotid diameter with NASCET-based stenosis levels, using the diameter of the distal internal carotid lumen as the denominator for stenosis measurement. The following velocity measurements were obtained: RIGHT ICA:  85/20 cm/sec CCA:  [id number] cm/sec SYSTOLIC ICA/CCA RATIO:  1.5 ECA:  76 cm/sec LEFT ICA:  45/11 cm/sec CCA:  [id number] cm/sec SYSTOLIC ICA/CCA RATIO:  1.1 ECA:  68 cm/sec RIGHT CAROTID ARTERY: Intimal thickening in the common carotid artery and carotid bulb. There is a mild amount of noncalcified plaque at the level of the carotid bulb. Minimal partially calcified plaque in the proximal right ICA. No evidence of significant stenosis with estimated right ICA stenosis of less than 50%. RIGHT VERTEBRAL ARTERY: Antegrade flow with normal waveform and velocity. LEFT CAROTID ARTERY: Mild calcified plaque at the level of the left ICA and left carotid bulb. No significant stenosis identified with estimated left ICA stenosis of less than 50%. LEFT VERTEBRAL ARTERY: Antegrade flow with normal waveform and velocity. IMPRESSION: Mild plaque at the level of both carotid bulbs and proximal internal carotid arteries without significant carotid stenosis identified. Bilateral estimated ICA stenoses of less than 50%. Electronically Signed   By: Aletta Edouard M.D.   On: 11/10/2019 14:34   ECHOCARDIOGRAM COMPLETE  Result Date: 11/11/2019    ECHOCARDIOGRAM REPORT   Patient Name:   [facility] Nelwyn Salisbury Date of Exam: 11/10/2019 Medical Rec #:  [id number]      Height:       66.0 in Accession #:    [id number]     Weight:       132.3 lb Date of Birth:  November 27, 1945      BSA:          1.68 m Patient Age:    74 years       BP:           137/58 mmHg Patient Gender: F              HR:           50 bpm. Exam Location:  [facility] Procedure: 2D Echo, Cardiac Doppler and Color Doppler Indications:     Stroke 434.91  History:         Patient has prior history of Echocardiogram examinations.                  Stroke; Risk Factors:Hypertension. Septal Repair:Amplazter.                  PFO.  Sonographer:     Alyse Low Roar Referring Phys:  Unknown Foley NIU Diagnosing Phys: Nelva Bush MD IMPRESSIONS  1.  Left ventricular ejection fraction, by visual estimation, is 55 to 60%. The left ventricle has normal function. There is mildly increased left ventricular hypertrophy.  2. Elevated left atrial pressure.  3. Left ventricular diastolic parameters are consistent with Grade II diastolic dysfunction (pseudonormalization).  4. The left ventricle has no regional wall motion abnormalities.  5. Global right ventricle has low normal systolic function.The right ventricular size is normal. No increase in right ventricular wall thickness.  6. Left atrial size was normal.  7. Right atrial size was normal.  8. Mild mitral annular calcification.  9. Moderate calcification of the mitral valve leaflet(s). 10. Moderate thickening of the mitral valve leaflet(s). 11. The mitral valve is degenerative. Mild mitral valve regurgitation. No evidence of mitral stenosis. 12. There is an ill-defined echodensity adjacent to the posterior leaflet on the atrial side of the mitral valve. While this could represent reverberation artifact, there is the possibility of a calcified mass or partially flail posterior leaflet with calcification. This could be further evaluated by TEE, as clinically indicated. 13. The tricuspid  valve is normal in structure. 14. The aortic valve was not well visualized. Aortic valve regurgitation is not visualized. No evidence of aortic valve sclerosis or stenosis. 15. The pulmonic valve was normal in structure. Pulmonic valve regurgitation is trivial. 16. Mildly dilated pulmonary artery. 17. TR signal is inadequate for assessing pulmonary artery systolic pressure. 18. The inferior vena cava is normal in size with greater than 50% respiratory variability, suggesting right atrial pressure of 3 mmHg. 19. The interatrial septum was not well visualized. FINDINGS  Left Ventricle: Left ventricular ejection fraction, by visual estimation, is 55 to 60%. The left ventricle has normal function. The left ventricle has no regional wall motion abnormalities. The left ventricular internal cavity size was the left ventricle is normal in size. There is mildly increased left ventricular hypertrophy. Left ventricular diastolic parameters are consistent with Grade II diastolic dysfunction (pseudonormalization). Elevated left atrial pressure. Right Ventricle: The right ventricular size is normal. No increase in right ventricular wall thickness. Global RV systolic function is has low normal systolic function. Left Atrium: Left atrial size was normal in size. Right Atrium: Right atrial size was normal in size Pericardium: There is no evidence of pericardial effusion. Mitral Valve: The mitral valve is degenerative in appearance. There is moderate thickening of the mitral valve leaflet(s). There is moderate calcification of the mitral valve leaflet(s). Mild mitral annular calcification. Mild mitral valve regurgitation.  No evidence of mitral valve stenosis by observation. There is an ill-defined echodensity adjacent to the posterior leaflet on the atrial side of the mitral valve. While this could represent reverberation artifact, there is the possibility of a calcified  mass or partially flail posterior leaflet with calcification.  This could be further evaluated by TEE, as clinically indicated. Tricuspid Valve: The tricuspid valve is normal in structure. Tricuspid valve regurgitation is trivial. Aortic Valve: The aortic valve was not well visualized. Aortic valve regurgitation is not visualized. The aortic valve is structurally normal, with no evidence of sclerosis or stenosis. Aortic valve mean gradient measures 3.0 mmHg. Aortic valve peak gradient measures 5.5 mmHg. Aortic valve area, by VTI measures 2.34 cm. Pulmonic Valve: The pulmonic valve was normal in structure. Pulmonic valve regurgitation is trivial. Pulmonic regurgitation is trivial. No evidence of pulmonic stenosis. Aorta: The aortic root is normal in size and structure. Pulmonary Artery: The pulmonary artery is mildly dilated. Venous: The inferior vena cava is normal in size with greater than 50%  respiratory variability, suggesting right atrial pressure of 3 mmHg. IAS/Shunts: The interatrial septum was not well visualized.  LEFT VENTRICLE PLAX 2D LVIDd:         4.83 cm  Diastology LVIDs:         3.53 cm  LV e' lateral:   8.05 cm/s LV PW:         1.07 cm  LV E/e' lateral: 15.7 LV IVS:        1.26 cm  LV e' medial:    4.46 cm/s LVOT diam:     1.90 cm  LV E/e' medial:  28.3 LV SV:         57 ml LV SV Index:   34.21 LVOT Area:     2.84 cm  RIGHT VENTRICLE RV Basal diam:  2.84 cm RV Mid diam:    2.87 cm RV S prime:     11.00 cm/s LEFT ATRIUM             Index       RIGHT ATRIUM           Index LA diam:        4.00 cm 2.38 cm/m  RA Area:     11.30 cm LA Vol (A2C):   49.7 ml 29.63 ml/m RA Volume:   23.10 ml  13.77 ml/m LA Vol (A4C):   57.0 ml 33.98 ml/m LA Biplane Vol: 52.6 ml 31.35 ml/m  AORTIC VALVE                   PULMONIC VALVE AV Area (Vmax):    2.14 cm    PV Vmax:        1.09 m/s AV Area (Vmean):   2.29 cm    PV Peak grad:   4.8 mmHg AV Area (VTI):     2.34 cm    RVOT Peak grad: 4 mmHg AV Vmax:           117.00 cm/s AV Vmean:          75.300 cm/s AV VTI:             0.290 m AV Peak Grad:      5.5 mmHg AV Mean Grad:      3.0 mmHg LVOT Vmax:         88.30 cm/s LVOT Vmean:        60.700 cm/s LVOT VTI:          0.239 m LVOT/AV VTI ratio: 0.82  AORTA Ao Root diam: 2.70 cm MITRAL VALVE MV Area (PHT): 3.60 cm              SHUNTS MV PHT:        61.19 msec            Systemic VTI:  0.24 m MV Decel Time: 211 msec              Systemic Diam: 1.90 cm MV E velocity: 126.00 cm/s 103 cm/s MV A velocity: 107.00 cm/s 70.3 cm/s MV E/A ratio:  1.18        1.5  Nelva Bush MD Electronically signed by Nelva Bush MD Signature Date/Time: 11/11/2019/11:06:02 AM    Final     Cardiac Studies   Echo  1. Left ventricular ejection fraction, by visual estimation, is 55 to 60%. The left ventricle has normal function. There is mildly increased left ventricular hypertrophy.  2. Elevated left atrial pressure.  3. Left ventricular diastolic parameters are consistent  with Grade II diastolic dysfunction (pseudonormalization).  4. The left ventricle has no regional wall motion abnormalities.  5. Global right ventricle has low normal systolic function.The right ventricular size is normal. No increase in right ventricular wall thickness.  6. Left atrial size was normal.  7. Right atrial size was normal.  8. Mild mitral annular calcification.  9. Moderate calcification of the mitral valve leaflet(s). 10. Moderate thickening of the mitral valve leaflet(s). 11. The mitral valve is degenerative. Mild mitral valve regurgitation. No evidence of mitral stenosis. 12. There is an ill-defined echodensity adjacent to the posterior leaflet on the atrial side of the mitral valve. While this could represent reverberation artifact, there is the possibility of a calcified mass or partially flail posterior leaflet with  calcification.  Patient Profile     Deryl Umphlett is a 74 y.o. female with a hx of paroxysmal A. fib not compliant with recommended Eliquis in the setting of financial constraints,  recurrent/cryptogenic stroke, PFO status post Amplatzer closure in 05/2009 at [facility], CKD stage III, DM, HTN, HLD, hypothyroidism, anxiety, depression, ongoing tobacco use, and GERD who is being seen today for the evaluation of abnormal echo, CVA,  at the request of Dr. Jimmye Norman.  Assessment & Plan     1.  CVA history of at least 6 strokes  status post PFO closure  documented PAF on loop recorder with recommendation to start Eliquis dating back to 06/2016  - aphasia 2 days prior to her admission on 11/10/2019.   CT head showed suspected acute left temporal-parietal-occipital infarct.   This was confirmed by MRI.   -- transition to oral anticoagulation /eliquis/xarelto in place of antiplatelet therapy In 10 to 14 days (per neurology)   2. Mitral valve d/o TEE this AM with calcified echodensity adjacent to the posterior leaflet on the atrial side of the mitral valve Suspect calcified mass versus heavily calcified posterior leaflet  -General anesthesia was required for procedure -Finding will need close monitoring as an outpatient with repeat TTE  3.  PAF:  documented on Linq recorder in 06/2016  recommendation to start Eliquis - financial constraints per the patient - seen in the A. fib clinic in 12/2018 with increased A. fib burden and again advised to start anticoagulation though did not ---increased A. fib burden of greater than 12 hours by device interrogation in 02/2019 with recommendation to resume oral anticoagulation-- during televisit in 03/2019.   --noncompliant with anticoagulation Per the notes, she is not very interested in warfarin secondary to frequent INR checks and dietary restrictions.  She has never completed financial assistance paperwork to see if she can obtain Eliquis or Xarelto at a cheaper cost.   -Continue rate control with metoprolol  4.  PFO: -Status post Amplatzer closure 05/2009 at [facility] contrast bubble study negative  5.  Ongoing tobacco abuse: -Complete  cessation is recommended  6.  HTN: -Remains on amlodipine, losartan, metoprolol   Total encounter time more than 25 minutes  Greater than 50% was spent in counseling and coordination of care with the patient   For questions or updates, please contact [place] Please consult [email or web address] for contact info under        Signed, Ida Rogue, MD  11/12/2019, 10:54 AM

## 2019-11-12 NOTE — Evaluation (Signed)
Speech Language Pathology Evaluation Patient Details Name: Erin Yates MRN: [id number] DOB: 01/22/1946 Today's Date: 11/12/2019 Time: [id number] SLP Time Calculation (min) (ACUTE ONLY): 60 min  Problem List:  Patient Active Problem List   Diagnosis Date Noted  . CVA (cerebral vascular accident) ([place]) 11/11/2019  . Stroke ([place]) 11/10/2019  . Type II diabetes mellitus with renal manifestations ([place]) 11/10/2019  . Depression   . GERD (gastroesophageal reflux disease)   . Hypertension   . Hypothyroidism   . Hypercholesteremia   . Tobacco abuse   . Cryptogenic stroke ([place]) 03/27/2016  . Stroke (cerebrum) ([place])   . CVA (cerebral infarction) 02/07/2016   Past Medical History:  Past Medical History:  Diagnosis Date  . Anemia    past  hx  . Anxiety   . Depression   . Diabetes mellitus without complication ([place])   . Dysrhythmia    prior to PFO correction  . GERD (gastroesophageal reflux disease)   . Headache    before PFO closure  . Hypercholesteremia   . Hypertension   . Hypothyroidism   . 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 PFO (patent foramen ovale)    correction device placed 2010  . Right leg numbness    lower leg- residual from CVA  . Seasonal allergies   . Stroke [facility])    prior to PFO correction   Past Surgical History:  Past Surgical History:  Procedure Laterality Date  . ABDOMINAL HYSTERECTOMY    . CARDIAC CATHETERIZATION     [facility]  . CATARACT EXTRACTION W/PHACO Right 09/01/2015   Procedure: CATARACT EXTRACTION PHACO AND INTRAOCULAR LENS PLACEMENT (IOC);  Surgeon: Leandrew Koyanagi, MD;  Location: [place];  Service: Ophthalmology;  Laterality: Right;  DIABETIC - oral meds  . CATARACT EXTRACTION W/PHACO Left 10/20/2015   Procedure: CATARACT EXTRACTION PHACO AND INTRAOCULAR LENS PLACEMENT (IOC);  Surgeon: Leandrew Koyanagi, MD;  Location: [place];  Service: Ophthalmology;  Laterality: Left;  DIABETIC - oral meds  . EP  IMPLANTABLE DEVICE N/A 04/04/2016   Procedure: Loop Recorder Insertion;  Surgeon: Deboraha Sprang, MD;  Location: [place] [facility];  Service: Cardiovascular;  Laterality: N/A;  . FOOT NEUROMA SURGERY Left   . NASAL SINUS SURGERY    . PATENT FORAMEN OVALE CLOSURE    . PATENT FORAMEN OVALE CLOSURE    . TEE WITHOUT CARDIOVERSION N/A 02/09/2016   Procedure: TRANSESOPHAGEAL ECHOCARDIOGRAM (TEE);  Surgeon: Adrian Prows, MD;  Location: [facility] ENDOSCOPY;  Service: Cardiovascular;  Laterality: N/A;   HPI:  Pt is a 74 y.o. female with a history of diabetes, stroke hypertension, who presents with complaints of difficulty speaking x2 days.  Pt does have a history of stroke in the past but last was 4 years ago -- Residual deficits in rightward gaze, peripheral vision, and sensation in her right leg from Prior stroke, complains of frontal headache, GERD, anxiety/depression.  Is able to answer yes/no questions; noted expressive speech deficits - Dysfluency+.  MD head CT impression includes: Concern for acute infarct at the left temporal-occipital-parietal junction.  MRI pending.    Assessment / Plan / Recommendation Clinical Impression  The patient is presenting with mild-moderate aphasia/apraxia (screening aphasia quotient is 75.8/100) characterized by dysfluent speech with mildly reduced information content.  Comprehension for basic information is intact, she has difficulty with lengthy or complex/abstract verbal information.  The patient would benefit from referral to home health speech therapy at discharge.   Western Aphasia Battery- Screening  Spontaneous Speech      Information content   8/10       Fluency    4/10     Comprehension     Yes/No questions   10/10          Sequential Commands  6/10     Repetition    7.5/10     Naming    Object Naming   10/10       Screening Aphasia Quotient 75.8/100     SLP Assessment  SLP Recommendation/Assessment: All further Speech Lanaguage Pathology  needs can be  addressed in the next venue of care SLP Visit Diagnosis: Aphasia (R47.01);Apraxia (R48.2)    Follow Up Recommendations  Home health SLP    Frequency and Duration           SLP Evaluation Cognition  Overall Cognitive Status: Within Functional Limits for tasks assessed Orientation Level: Oriented X4       Comprehension  Auditory Comprehension Overall Auditory Comprehension: Impaired    Expression Expression Primary Mode of Expression: Verbal Verbal Expression Overall Verbal Expression: Impaired   Oral / Motor  Motor Speech Overall Motor Speech: Impaired Motor Planning: Impaired Level of Impairment: Word Motor Speech Errors: Aware(Initial sound repetition)   GO                   Leroy Sea, MS/CCC- SLP  Valetta Fuller, Susie 11/12/2019, 2:15 PM

## 2019-11-13 DIAGNOSIS — I480 Paroxysmal atrial fibrillation: Secondary | ICD-10-CM | POA: Diagnosis present

## 2019-11-13 DIAGNOSIS — E1122 Type 2 diabetes mellitus with diabetic chronic kidney disease: Secondary | ICD-10-CM

## 2019-11-13 DIAGNOSIS — E039 Hypothyroidism, unspecified: Secondary | ICD-10-CM

## 2019-11-13 LAB — GLUCOSE, CAPILLARY
Glucose-Capillary: 117 mg/dL — ABNORMAL HIGH (ref 70–99)
Glucose-Capillary: 128 mg/dL — ABNORMAL HIGH (ref 70–99)

## 2019-11-13 MED ORDER — SIMVASTATIN 10 MG PO TABS: 10.0000 mg | ORAL_TABLET | Freq: Every evening | ORAL | 0 refills | Status: DC

## 2019-11-13 MED ORDER — NICOTINE 21 MG/24HR TD PT24: 21.0000 mg | MEDICATED_PATCH | Freq: Every day | TRANSDERMAL | 0 refills | Status: DC

## 2019-11-13 MED ORDER — ASPIRIN 81 MG PO TBEC: 81.0000 mg | DELAYED_RELEASE_TABLET | Freq: Every day | ORAL | 3 refills | Status: DC

## 2019-11-13 MED ORDER — CLOPIDOGREL BISULFATE 75 MG PO TABS: 75.0000 mg | ORAL_TABLET | Freq: Every day | ORAL | 0 refills | Status: AC

## 2019-11-13 MED ORDER — APIXABAN 5 MG PO TABS: 5.0000 mg | ORAL_TABLET | Freq: Two times a day (BID) | ORAL | 3 refills | Status: DC

## 2019-11-13 NOTE — Discharge Instructions (Signed)
Ischemic Stroke    An ischemic stroke is the sudden death of brain tissue. Blood carries oxygen to all areas of the body. This type of stroke happens when your blood does not flow to your brain like normal. Your brain cannot get the oxygen it needs. This is an emergency. It must be treated right away.  Symptoms of a stroke usually happen all of a sudden. You may notice them when you wake up. They can include:  · Weakness or loss of feeling in your face, arm, or leg. This often happens on one side of the body.  · Trouble walking.  · Trouble moving your arms or legs.  · Loss of balance or coordination.  · Feeling confused.  · Trouble talking or understanding what people are saying.  · Slurred speech.  · Trouble seeing.  · Seeing two of one object (double vision).  · Feeling dizzy.  · Feeling sick to your stomach (nauseous) and throwing up (vomiting).  · A very bad headache for no reason.  Get help as soon as any of these problems start. This is important. Some treatments work better if they are given right away. These include:  · Aspirin.  · Medicines to control blood pressure.  · A shot (injection) of medicine to break up the blood clot.  · Treatments given in the blood vessel (artery) to take out the clot or break it up.  Other treatments may include:  · Oxygen.  · Fluids given through an IV tube.  · Medicines to thin out your blood.  · Procedures to help your blood flow better.  What increases the risk?  Certain things may make you more likely to have a stroke. Some of these are things that you can change, such as:  · Being very overweight (obesity).  · Smoking.  · Taking birth control pills.  · Not being active.  · Drinking too much alcohol.  · Using drugs.  Other risk factors include:  · High blood pressure.  · High cholesterol.  · Diabetes.  · Heart disease.  · Being African American, Native American, Hispanic, or [place] Native.  · Being over age 60.  · Family history of stroke.  · Having had blood clots,  stroke, or warning stroke (transient ischemic attack, TIA) in the past.  · Sickle cell disease.  · Being a woman with a history of high blood pressure in pregnancy (preeclampsia).  · Migraine headache.  · Sleep apnea.  · Having an irregular heartbeat (atrial fibrillation).  · Long-term (chronic) diseases that cause soreness and swelling (inflammation).  · Disorders that affect how your blood clots.  Follow these instructions at home:  Medicines  · Take over-the-counter and prescription medicines only as told by your doctor.  · If you were told to take aspirin or another medicine to thin your blood, take it exactly as told by your doctor.  ? Taking too much of the medicine can cause bleeding.  ? If you do not take enough, it may not work as well.  · Know the side effects of your medicines. If you are taking a blood thinner, make sure you:  ? Hold pressure over any cuts for longer than usual.  ? Tell your dentist and other doctors that you take this medicine.  ? Avoid activities that may cause damage or injury to your body.  Eating and drinking  · Follow instructions from your doctor about what you cannot eat or drink.  · Eat   This may include: ? Having experts look at your home to make sure it is safe. ? Putting grab bars in the bedroom and bathroom. ? Using raised toilets. ? Putting a seat in the shower. General instructions  Do not use any tobacco products. ? Examples of these are cigarettes, chewing tobacco, and e-cigarettes. ? If you need help quitting, ask your doctor.  Limit how much alcohol you drink. This means no more than 1 drink a day for nonpregnant women and 2 drinks  a day for men. One drink equals 12 oz of beer, 5 oz of wine, or 1 oz of hard liquor.  If you need help to stop using drugs or alcohol, ask your doctor to refer you to a program or specialist.  Stay active. Exercise as told by your doctor.  Keep all follow-up visits as told by your doctor. This is important. Get help right away if:   You have any signs of a stroke. "BE FAST" is an easy way to remember the main warning signs: ? B - Balance. Signs are dizziness, sudden trouble walking, or loss of balance. ? E - Eyes. Signs are trouble seeing or a change in how you see. ? F - Face. Signs are sudden weakness or loss of feeling of the face, or the face or eyelid drooping on one side. ? A - Arms. Signs are weakness or loss of feeling in an arm. This happens suddenly and usually on one side of the body. ? S - Speech. Signs are sudden trouble speaking, slurred speech, or trouble understanding what people say. ? T - Time. Time to call emergency services. Write down what time symptoms started.  You have other signs of a stroke, such as: ? A sudden, very bad headache with no known cause. ? Feeling sick to your stomach (nausea). ? Throwing up (vomiting). ? Jerky movements you cannot control (seizure). These symptoms may be an emergency. Do not wait to see if the symptoms will go away. Get medical help right away. Call your local emergency services (911 in the [place] Do not drive yourself to the hospital. Summary  An ischemic stroke is the sudden death of brain tissue.  Symptoms of a stroke usually happen all of a sudden. You may notice them when you wake up.  Get help if you have any warning signs of a stroke. This is important. Some treatments work better if they are given right away. This information is not intended to replace advice given to you by your health care provider. Make sure you discuss any questions you have with your health care provider. Document Revised: 04/03/2018 Document  Reviewed: 01/19/2016 Elsevier Patient Education  2020 [employer] [facility]

## 2019-11-13 NOTE — Discharge Summary (Signed)
Physician Discharge Summary  Erin Yates [id number] DOB: 03-Nov-1946 DOA: 11/10/2019  PCP: Adin Hector, MD  Admit date: 11/10/2019 Discharge date: 11/13/2019  Admitted From: Home Disposition: Home  Recommendations for Outpatient Follow-up:  1. Follow up with PCP in 1-2 weeks 2. Outpatient referral to neurology (stroke team) made. 3. Patient will be discharged on 5 more days of oral aspirin and Plavix after which she will continue baby aspirin and start on Eliquis.  Home Health: RN, PT, OT Equipment/Devices: None  Discharge Condition: Fair CODE STATUS: Full code Diet recommendation: Heart Healthy / Carb Modified     Discharge Diagnoses:  Principal Problem:   CVA (cerebral vascular accident) ([place])   Active Problems:   Depression, chronic   GERD (gastroesophageal reflux disease) Essential hypertension hypothyroidism Dyslipidemia   Tobacco abuse   Type II diabetes mellitus with renal manifestations (HCC)   AF (paroxysmal atrial fibrillation) (HCC)  Brief narrative/HPI 74 year old female with history of stroke, paroxysmal A. 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 CT of the head in the ED showed possible left temporal-occipital stroke.   Hospital course   Principal Problem: Acute ischemic stroke Acute left parietal stroke with high-grade stenosis of the proximal left A1 segment and moderate stenosis of proximal right P2 segment. 2D echo with EF of 55 and 60% and grade 2 diastolic dysfunction. TEE done given echo finding of ill-defined echodensity in the mitral valve leaflet.  TEE done shows normal EF, no PFO and no LA or LA appendage thrombus.  Stroke likely contributed by paroxysmal A. fib. Neurology recommends anticoagulation (NOAC) which can be started  early next week after patient completes 7 days of dual antiplatelet.  (Patient will be on baby aspirin and Plavix until 1/14 and then on baby aspirin and Eliquis).  Outpatient referral to stroke service.  Counseled and provided education on secondary stroke prevention. Added low-dose Zocor for elevated triglycerides.  LDL normal.  PT recommends home health which has been ordered.  Stable to be discharged home.   Active Problems: Paroxysmal A. fib CHA2DS2-VASc of 6. Not on anticoagulation due to cost issues for NOAC and refusing warfarin due to need for frequent INR monitoring. TOC consulted for medication assistance.  Patient given 30-day voucher and after discussing cost for Eliquis further on file/he can afford it.  Prescription given for Eliquis to be started on 1/14.    Depression, chronic On multiple meds, resume.  Essential hypertension  Resumed home dose amlodipine, losartan, metoprolol and HCTZ.  Hypothyroidism Continue Synthroid  Dyslipidemia LDL in the 30s.  Elevated triglycerides.  Will add low-dose Zocor.  Tobacco use Counseled on cessation. Nicotine patch prescribed  Diabetes mellitus type 2, controlled Stable.  Continue Metformin  Acute on chronic versus chronic kidney disease stage III No baseline creatinine in the system for past 3 years. Improving.  Follow as outpatient    Family Communication  : Husband at bedside  Disposition Plan  : Home   Consults  : Neurology, cardiology  Procedures  : CT head, MRI brain, TEE    Discharge Instructions  Discharge Instructions    Ambulatory referral to Neurology   Complete by: As directed    An appointment is requested in approximately: 6 weeks     Allergies as of 11/13/2019      Reactions  Alendronate Other (See Comments)   Penicillins Rash   Also swelling and itching      Medication List    TAKE these medications   ALPRAZolam 0.25 MG tablet Commonly known as: XANAX Take 0.25 mg by mouth  2 (two) times daily as needed for anxiety.   amLODipine 10 MG tablet Commonly known as: NORVASC Take 10 mg by mouth daily. PM   apixaban 5 MG Tabs tablet Commonly known as: Eliquis Take 1 tablet (5 mg total) by mouth 2 (two) times daily. Start taking on: November 19, 2019   aspirin 81 MG EC tablet Take 1 tablet (81 mg total) by mouth daily. Start taking on: November 14, 2019   buPROPion 150 MG 12 hr tablet Commonly known as: WELLBUTRIN SR Take 1 tablet (150 mg total) by mouth 2 (two) times daily.   cholecalciferol 25 MCG (1000 UT) tablet Commonly known as: VITAMIN D3 Take 1,000 Units by mouth daily.   clopidogrel 75 MG tablet Commonly known as: PLAVIX Take 1 tablet (75 mg total) by mouth daily for 5 days. Start taking on: November 14, 2019   fluticasone 50 MCG/ACT nasal spray Commonly known as: FLONASE Place into both nostrils daily as needed for allergies or rhinitis.   hydrochlorothiazide 12.5 MG capsule Commonly known as: MICROZIDE Take 12.5 mg by mouth daily.   levothyroxine 50 MCG tablet Commonly known as: SYNTHROID Take 50 mcg by mouth daily before breakfast.   losartan 100 MG tablet Commonly known as: COZAAR Take 100 mg by mouth daily. AM   metFORMIN 1000 MG tablet Commonly known as: GLUCOPHAGE Take 1,000 mg by mouth 2 (two) times daily with a meal.   metoprolol 200 MG 24 hr tablet Commonly known as: TOPROL-XL Take 200 mg by mouth daily.   nicotine 21 mg/24hr patch Commonly known as: NICODERM CQ - dosed in mg/24 hours Place 1 patch (21 mg total) onto the skin daily. Start taking on: November 14, 2019   pantoprazole 40 MG tablet Commonly known as: PROTONIX Take 40 mg by mouth 2 (two) times daily. Reported on 04/17/2016   simvastatin 10 MG tablet Commonly known as: Zocor Take 1 tablet (10 mg total) by mouth every evening.   traZODone 50 MG tablet Commonly known as: DESYREL Take 50 mg by mouth at bedtime.   Venlafaxine HCl 225 MG Tb24 Take 225 mg by  mouth daily with breakfast.   vitamin B-12 500 MCG tablet Commonly known as: CYANOCOBALAMIN Take 500 mcg by mouth daily. AM      Follow-up Information    Adin Hector, MD Follow up.   Specialty: Internal Medicine Contact information: [address] [facility] [place] [address] [phone]          Allergies  Allergen Reactions  . Alendronate Other (See Comments)  . Penicillins Rash    Also swelling and itching      Procedures/Studies: CT HEAD WO CONTRAST  Result Date: 11/10/2019 CLINICAL DATA:  Dysarthria EXAM: CT HEAD WITHOUT CONTRAST TECHNIQUE: Contiguous axial images were obtained from the base of the skull through the vertex without intravenous contrast. COMPARISON:  Head CT February 07, 2016; brain MRI February 08, 2016. FINDINGS: Brain: There is mild diffuse atrophy. There is no demonstrable intracranial mass, hemorrhage, extra-axial fluid collection, or midline shift. There is an acute appearing infarct on the left at the left temporal-parietal-occipital junction with localized cytotoxic edema in this area. There are prior infarcts evident in each medial superior occipital lobe as  well as in the mid right occipital lobe. There is evidence of a prior infarct involving portions of the head of the caudate nucleus on the right as well as portions of the anterior limbs of the right internal and external capsule. There is a prior infarct at the gray-white junction of the posterior left frontal lobe. There is mild small vessel disease in the centra semiovale bilaterally. There is evidence of a prior small infarct in the left cerebellum along the inferior dentate nucleus level. Vascular: There is no hyperdense vessel. There is calcification in each carotid siphon region. Skull: The bony calvarium appears intact. Sinuses/Orbits: There is mucosal thickening in several ethmoid air cells. Other visualized paranasal sinuses are clear. Visualized orbits appear symmetric  bilaterally. Other: Visualized mastoid air cells are clear. IMPRESSION: 1. Concern for acute infarct at the left temporal-occipital-parietal junction. 2. There is mild atrophy with multiple prior infarcts and periventricular small vessel disease as summarized above. 3.  No demonstrable mass or hemorrhage. 4.  Foci of arterial vascular calcification noted. 5.  There is mucosal thickening in several ethmoid air cells. Electronically Signed   By: Lowella Grip III M.D.   On: 11/10/2019 08:49   MR ANGIO HEAD WO CONTRAST  Result Date: 11/11/2019 CLINICAL DATA:  Abnormal speech beginning 3 days ago. Dysarthria. EXAM: MRI HEAD WITHOUT CONTRAST MRA HEAD WITHOUT CONTRAST MRA NECK WITHOUT CONTRAST TECHNIQUE: Multiplanar, multiecho pulse sequences of the brain and surrounding structures were obtained without intravenous contrast. Angiographic images of the Circle of Willis were obtained using MRA technique without intravenous contrast. Angiographic images of the neck were obtained using MRA technique without intravenous contrast. Carotid stenosis measurements (when applicable) are obtained utilizing NASCET criteria, using the distal internal carotid diameter as the denominator. COMPARISON:  None. FINDINGS: MRI HEAD FINDINGS Brain: The diffusion-weighted images confirm an acute/subacute nonhemorrhagic infarct involving the left parietal lobe. Remote infarcts with volume loss are present in the occipital poles bilaterally. A smaller remote infarct is present in the right parietal lobe. A remote cortical infarct is present in the left frontal operculum. Remote lacunar infarcts are present in the right basal ganglia. Mild periventricular white matter changes are present bilaterally. The ventricles are of normal size. No significant extraaxial fluid collection is present. White matter changes extend into the brainstem. Remote nonhemorrhagic lacunar infarcts are present in the cerebellum bilaterally. Vascular: Flow is present  in the major intracranial arteries. Skull and upper cervical spine: The craniocervical junction is normal. Upper cervical spine is within normal limits. Marrow signal is unremarkable. Sinuses/Orbits: A polyp or mucous retention cyst is present in the left sphenoid sinus. Left maxillary antrostomy is noted. Left ethmoidectomies are noted. No other significant mucosal disease is present in the paranasal sinuses. Minimal fluid is present in the mastoid air cells bilaterally. No obstructing nasopharyngeal lesion is present. Bilateral lens replacements are noted. Globes and orbits are otherwise unremarkable. MRA HEAD FINDINGS There is significant signal loss just below the skull base in the internal carotid arteries bilaterally. This is likely artifactual. Mild atherosclerotic changes are present through the cavernous internal carotid arteries bilaterally. There is no significant stenosis. ICA termini are normal. High-grade stenosis is present in the proximal left A1 segment. Right A1 segment is small. M1 segments are normal bilaterally. MCA bifurcations are within normal limits. There is some attenuation of distal MCA branch vessels without a significant proximal stenosis or occlusion. The left vertebral artery is slightly dominant to the right. PICA origins are visualized and normal. Basilar artery  is normal. Both posterior cerebral arteries originate from basilar tip. Moderate stenosis is present in the proximal right P2 segment. There is some attenuation of distal PCA branch vessels bilaterally. MRA NECK FINDINGS There is a common origin of the left common carotid artery in the innominate artery. No significant flow disturbance is present at either carotid bifurcation. Flow is antegrade in the vertebral arteries bilaterally without significant stenosis IMPRESSION: 1. Acute/subacute nonhemorrhagic infarct involving the left parietal lobe. 2. Remote infarcts involving the occipital poles bilaterally, the left frontal  operculum, and cerebellum. 3. Mild periventricular white matter disease bilaterally likely reflects the sequela of chronic microvascular ischemia. 4. MRA Circle of Willis demonstrates no significant proximal stenosis, aneurysm, or branch vessel occlusion. 5. High-grade stenosis of the proximal left A1 segment. 6. Moderate stenosis of the proximal right P2 segment. 7. Mild atherosclerotic changes at the cavernous internal carotid arteries bilaterally without other significant stenoses. 8. Signal loss in the internal carotid arteries bilaterally just below the skull base is likely artifactual. Electronically Signed   By: San Morelle M.D.   On: 11/11/2019 12:17   MR ANGIO NECK WO CONTRAST  Result Date: 11/11/2019 CLINICAL DATA:  Abnormal speech beginning 3 days ago. Dysarthria. EXAM: MRI HEAD WITHOUT CONTRAST MRA HEAD WITHOUT CONTRAST MRA NECK WITHOUT CONTRAST TECHNIQUE: Multiplanar, multiecho pulse sequences of the brain and surrounding structures were obtained without intravenous contrast. Angiographic images of the Circle of Willis were obtained using MRA technique without intravenous contrast. Angiographic images of the neck were obtained using MRA technique without intravenous contrast. Carotid stenosis measurements (when applicable) are obtained utilizing NASCET criteria, using the distal internal carotid diameter as the denominator. COMPARISON:  None. FINDINGS: MRI HEAD FINDINGS Brain: The diffusion-weighted images confirm an acute/subacute nonhemorrhagic infarct involving the left parietal lobe. Remote infarcts with volume loss are present in the occipital poles bilaterally. A smaller remote infarct is present in the right parietal lobe. A remote cortical infarct is present in the left frontal operculum. Remote lacunar infarcts are present in the right basal ganglia. Mild periventricular white matter changes are present bilaterally. The ventricles are of normal size. No significant extraaxial fluid  collection is present. White matter changes extend into the brainstem. Remote nonhemorrhagic lacunar infarcts are present in the cerebellum bilaterally. Vascular: Flow is present in the major intracranial arteries. Skull and upper cervical spine: The craniocervical junction is normal. Upper cervical spine is within normal limits. Marrow signal is unremarkable. Sinuses/Orbits: A polyp or mucous retention cyst is present in the left sphenoid sinus. Left maxillary antrostomy is noted. Left ethmoidectomies are noted. No other significant mucosal disease is present in the paranasal sinuses. Minimal fluid is present in the mastoid air cells bilaterally. No obstructing nasopharyngeal lesion is present. Bilateral lens replacements are noted. Globes and orbits are otherwise unremarkable. MRA HEAD FINDINGS There is significant signal loss just below the skull base in the internal carotid arteries bilaterally. This is likely artifactual. Mild atherosclerotic changes are present through the cavernous internal carotid arteries bilaterally. There is no significant stenosis. ICA termini are normal. High-grade stenosis is present in the proximal left A1 segment. Right A1 segment is small. M1 segments are normal bilaterally. MCA bifurcations are within normal limits. There is some attenuation of distal MCA branch vessels without a significant proximal stenosis or occlusion. The left vertebral artery is slightly dominant to the right. PICA origins are visualized and normal. Basilar artery is normal. Both posterior cerebral arteries originate from basilar tip. Moderate stenosis is present in the  proximal right P2 segment. There is some attenuation of distal PCA branch vessels bilaterally. MRA NECK FINDINGS There is a common origin of the left common carotid artery in the innominate artery. No significant flow disturbance is present at either carotid bifurcation. Flow is antegrade in the vertebral arteries bilaterally without  significant stenosis IMPRESSION: 1. Acute/subacute nonhemorrhagic infarct involving the left parietal lobe. 2. Remote infarcts involving the occipital poles bilaterally, the left frontal operculum, and cerebellum. 3. Mild periventricular white matter disease bilaterally likely reflects the sequela of chronic microvascular ischemia. 4. MRA Circle of Willis demonstrates no significant proximal stenosis, aneurysm, or branch vessel occlusion. 5. High-grade stenosis of the proximal left A1 segment. 6. Moderate stenosis of the proximal right P2 segment. 7. Mild atherosclerotic changes at the cavernous internal carotid arteries bilaterally without other significant stenoses. 8. Signal loss in the internal carotid arteries bilaterally just below the skull base is likely artifactual. Electronically Signed   By: San Morelle M.D.   On: 11/11/2019 12:17   MR BRAIN WO CONTRAST  Result Date: 11/11/2019 CLINICAL DATA:  Abnormal speech beginning 3 days ago. Dysarthria. EXAM: MRI HEAD WITHOUT CONTRAST MRA HEAD WITHOUT CONTRAST MRA NECK WITHOUT CONTRAST TECHNIQUE: Multiplanar, multiecho pulse sequences of the brain and surrounding structures were obtained without intravenous contrast. Angiographic images of the Circle of Willis were obtained using MRA technique without intravenous contrast. Angiographic images of the neck were obtained using MRA technique without intravenous contrast. Carotid stenosis measurements (when applicable) are obtained utilizing NASCET criteria, using the distal internal carotid diameter as the denominator. COMPARISON:  None. FINDINGS: MRI HEAD FINDINGS Brain: The diffusion-weighted images confirm an acute/subacute nonhemorrhagic infarct involving the left parietal lobe. Remote infarcts with volume loss are present in the occipital poles bilaterally. A smaller remote infarct is present in the right parietal lobe. A remote cortical infarct is present in the left frontal operculum. Remote lacunar  infarcts are present in the right basal ganglia. Mild periventricular white matter changes are present bilaterally. The ventricles are of normal size. No significant extraaxial fluid collection is present. White matter changes extend into the brainstem. Remote nonhemorrhagic lacunar infarcts are present in the cerebellum bilaterally. Vascular: Flow is present in the major intracranial arteries. Skull and upper cervical spine: The craniocervical junction is normal. Upper cervical spine is within normal limits. Marrow signal is unremarkable. Sinuses/Orbits: A polyp or mucous retention cyst is present in the left sphenoid sinus. Left maxillary antrostomy is noted. Left ethmoidectomies are noted. No other significant mucosal disease is present in the paranasal sinuses. Minimal fluid is present in the mastoid air cells bilaterally. No obstructing nasopharyngeal lesion is present. Bilateral lens replacements are noted. Globes and orbits are otherwise unremarkable. MRA HEAD FINDINGS There is significant signal loss just below the skull base in the internal carotid arteries bilaterally. This is likely artifactual. Mild atherosclerotic changes are present through the cavernous internal carotid arteries bilaterally. There is no significant stenosis. ICA termini are normal. High-grade stenosis is present in the proximal left A1 segment. Right A1 segment is small. M1 segments are normal bilaterally. MCA bifurcations are within normal limits. There is some attenuation of distal MCA branch vessels without a significant proximal stenosis or occlusion. The left vertebral artery is slightly dominant to the right. PICA origins are visualized and normal. Basilar artery is normal. Both posterior cerebral arteries originate from basilar tip. Moderate stenosis is present in the proximal right P2 segment. There is some attenuation of distal PCA branch vessels bilaterally. MRA NECK FINDINGS  There is a common origin of the left common  carotid artery in the innominate artery. No significant flow disturbance is present at either carotid bifurcation. Flow is antegrade in the vertebral arteries bilaterally without significant stenosis IMPRESSION: 1. Acute/subacute nonhemorrhagic infarct involving the left parietal lobe. 2. Remote infarcts involving the occipital poles bilaterally, the left frontal operculum, and cerebellum. 3. Mild periventricular white matter disease bilaterally likely reflects the sequela of chronic microvascular ischemia. 4. MRA Circle of Willis demonstrates no significant proximal stenosis, aneurysm, or branch vessel occlusion. 5. High-grade stenosis of the proximal left A1 segment. 6. Moderate stenosis of the proximal right P2 segment. 7. Mild atherosclerotic changes at the cavernous internal carotid arteries bilaterally without other significant stenoses. 8. Signal loss in the internal carotid arteries bilaterally just below the skull base is likely artifactual. Electronically Signed   By: San Morelle M.D.   On: 11/11/2019 12:17   US Carotid Bilateral (at [facility] and [facility] only)  Result Date: 11/10/2019 CLINICAL DATA:  Cerebral infarction. History hypertension, hyperlipidemia and coronary artery disease. EXAM: BILATERAL CAROTID DUPLEX ULTRASOUND TECHNIQUE: Pearline Cables scale imaging, color Doppler and duplex ultrasound were performed of bilateral carotid and vertebral arteries in the neck. COMPARISON:  11/19/2007 FINDINGS: Criteria: Quantification of carotid stenosis is based on velocity parameters that correlate the residual internal carotid diameter with NASCET-based stenosis levels, using the diameter of the distal internal carotid lumen as the denominator for stenosis measurement. The following velocity measurements were obtained: RIGHT ICA:  85/20 cm/sec CCA:  [id number] cm/sec SYSTOLIC ICA/CCA RATIO:  1.5 ECA:  76 cm/sec LEFT ICA:  45/11 cm/sec CCA:  [id number] cm/sec SYSTOLIC ICA/CCA RATIO:  1.1 ECA:  68 cm/sec RIGHT CAROTID ARTERY:  Intimal thickening in the common carotid artery and carotid bulb. There is a mild amount of noncalcified plaque at the level of the carotid bulb. Minimal partially calcified plaque in the proximal right ICA. No evidence of significant stenosis with estimated right ICA stenosis of less than 50%. RIGHT VERTEBRAL ARTERY: Antegrade flow with normal waveform and velocity. LEFT CAROTID ARTERY: Mild calcified plaque at the level of the left ICA and left carotid bulb. No significant stenosis identified with estimated left ICA stenosis of less than 50%. LEFT VERTEBRAL ARTERY: Antegrade flow with normal waveform and velocity. IMPRESSION: Mild plaque at the level of both carotid bulbs and proximal internal carotid arteries without significant carotid stenosis identified. Bilateral estimated ICA stenoses of less than 50%. Electronically Signed   By: Aletta Edouard M.D.   On: 11/10/2019 14:34   ECHOCARDIOGRAM COMPLETE  Result Date: 11/11/2019   ECHOCARDIOGRAM REPORT   Patient Name:   [facility] Nelwyn Salisbury Date of Exam: 11/10/2019 Medical Rec #:  [id number]      Height:       66.0 in Accession #:    [id number]     Weight:       132.3 lb Date of Birth:  [birth year]      BSA:          1.68 m Patient Age:    37 years       BP:           137/58 mmHg Patient Gender: F              HR:           50 bpm. Exam Location:  [facility] Procedure: 2D Echo, Cardiac Doppler and Color Doppler Indications:     Stroke 434.91  History:  Patient has prior history of Echocardiogram examinations.                  Stroke; Risk Factors:Hypertension. Septal Repair:Amplazter.                  PFO.  Sonographer:     Alyse Low Roar Referring Phys:  Unknown Foley NIU Diagnosing Phys: Nelva Bush MD IMPRESSIONS  1. Left ventricular ejection fraction, by visual estimation, is 55 to 60%. The left ventricle has normal function. There is mildly increased left ventricular hypertrophy.  2. Elevated left atrial pressure.  3. Left ventricular diastolic parameters are  consistent with Grade II diastolic dysfunction (pseudonormalization).  4. The left ventricle has no regional wall motion abnormalities.  5. Global right ventricle has low normal systolic function.The right ventricular size is normal. No increase in right ventricular wall thickness.  6. Left atrial size was normal.  7. Right atrial size was normal.  8. Mild mitral annular calcification.  9. Moderate calcification of the mitral valve leaflet(s). 10. Moderate thickening of the mitral valve leaflet(s). 11. The mitral valve is degenerative. Mild mitral valve regurgitation. No evidence of mitral stenosis. 12. There is an ill-defined echodensity adjacent to the posterior leaflet on the atrial side of the mitral valve. While this could represent reverberation artifact, there is the possibility of a calcified mass or partially flail posterior leaflet with calcification. This could be further evaluated by TEE, as clinically indicated. 13. The tricuspid valve is normal in structure. 14. The aortic valve was not well visualized. Aortic valve regurgitation is not visualized. No evidence of aortic valve sclerosis or stenosis. 15. The pulmonic valve was normal in structure. Pulmonic valve regurgitation is trivial. 16. Mildly dilated pulmonary artery. 17. TR signal is inadequate for assessing pulmonary artery systolic pressure. 18. The inferior vena cava is normal in size with greater than 50% respiratory variability, suggesting right atrial pressure of 3 mmHg. 19. The interatrial septum was not well visualized. FINDINGS  Left Ventricle: Left ventricular ejection fraction, by visual estimation, is 55 to 60%. The left ventricle has normal function. The left ventricle has no regional wall motion abnormalities. The left ventricular internal cavity size was the left ventricle is normal in size. There is mildly increased left ventricular hypertrophy. Left ventricular diastolic parameters are consistent with Grade II diastolic  dysfunction (pseudonormalization). Elevated left atrial pressure. Right Ventricle: The right ventricular size is normal. No increase in right ventricular wall thickness. Global RV systolic function is has low normal systolic function. Left Atrium: Left atrial size was normal in size. Right Atrium: Right atrial size was normal in size Pericardium: There is no evidence of pericardial effusion. Mitral Valve: The mitral valve is degenerative in appearance. There is moderate thickening of the mitral valve leaflet(s). There is moderate calcification of the mitral valve leaflet(s). Mild mitral annular calcification. Mild mitral valve regurgitation.  No evidence of mitral valve stenosis by observation. There is an ill-defined echodensity adjacent to the posterior leaflet on the atrial side of the mitral valve. While this could represent reverberation artifact, there is the possibility of a calcified  mass or partially flail posterior leaflet with calcification. This could be further evaluated by TEE, as clinically indicated. Tricuspid Valve: The tricuspid valve is normal in structure. Tricuspid valve regurgitation is trivial. Aortic Valve: The aortic valve was not well visualized. Aortic valve regurgitation is not visualized. The aortic valve is structurally normal, with no evidence of sclerosis or stenosis. Aortic valve mean gradient measures 3.0 mmHg.  Aortic valve peak gradient measures 5.5 mmHg. Aortic valve area, by VTI measures 2.34 cm. Pulmonic Valve: The pulmonic valve was normal in structure. Pulmonic valve regurgitation is trivial. Pulmonic regurgitation is trivial. No evidence of pulmonic stenosis. Aorta: The aortic root is normal in size and structure. Pulmonary Artery: The pulmonary artery is mildly dilated. Venous: The inferior vena cava is normal in size with greater than 50% respiratory variability, suggesting right atrial pressure of 3 mmHg. IAS/Shunts: The interatrial septum was not well visualized.  LEFT  VENTRICLE PLAX 2D LVIDd:         4.83 cm  Diastology LVIDs:         3.53 cm  LV e' lateral:   8.05 cm/s LV PW:         1.07 cm  LV E/e' lateral: 15.7 LV IVS:        1.26 cm  LV e' medial:    4.46 cm/s LVOT diam:     1.90 cm  LV E/e' medial:  28.3 LV SV:         57 ml LV SV Index:   34.21 LVOT Area:     2.84 cm  RIGHT VENTRICLE RV Basal diam:  2.84 cm RV Mid diam:    2.87 cm RV S prime:     11.00 cm/s LEFT ATRIUM             Index       RIGHT ATRIUM           Index LA diam:        4.00 cm 2.38 cm/m  RA Area:     11.30 cm LA Vol (A2C):   49.7 ml 29.63 ml/m RA Volume:   23.10 ml  13.77 ml/m LA Vol (A4C):   57.0 ml 33.98 ml/m LA Biplane Vol: 52.6 ml 31.35 ml/m  AORTIC VALVE                   PULMONIC VALVE AV Area (Vmax):    2.14 cm    PV Vmax:        1.09 m/s AV Area (Vmean):   2.29 cm    PV Peak grad:   4.8 mmHg AV Area (VTI):     2.34 cm    RVOT Peak grad: 4 mmHg AV Vmax:           117.00 cm/s AV Vmean:          75.300 cm/s AV VTI:            0.290 m AV Peak Grad:      5.5 mmHg AV Mean Grad:      3.0 mmHg LVOT Vmax:         88.30 cm/s LVOT Vmean:        60.700 cm/s LVOT VTI:          0.239 m LVOT/AV VTI ratio: 0.82  AORTA Ao Root diam: 2.70 cm MITRAL VALVE MV Area (PHT): 3.60 cm              SHUNTS MV PHT:        61.19 msec            Systemic VTI:  0.24 m MV Decel Time: 211 msec              Systemic Diam: 1.90 cm MV E velocity: 126.00 cm/s 103 cm/s MV A velocity: 107.00 cm/s 70.3 cm/s MV E/A ratio:  1.18  1.5  Nelva Bush MD Electronically signed by Nelva Bush MD Signature Date/Time: 11/11/2019/11:06:02 AM    Final    ECHO TEE  Result Date: 11/12/2019   TRANSESOPHOGEAL ECHO REPORT   Patient Name:   [facility] Date of Exam: 11/12/2019 Medical Rec #:  [id number]      Height:       66.0 in Accession #:    [id number]     Weight:       132.3 lb Date of Birth:  1946/05/12      BSA:          1.68 m Patient Age:    30 years       BP:           134/55 mmHg Patient Gender: F              HR:            56 bpm. Exam Location:  [facility]  Procedure: Transesophageal Echo, Color Doppler, Cardiac Doppler and Saline            Contrast Bubble Study Indications:     Stroke 434.91  History:         Patient has prior history of Echocardiogram examinations, most                  recent 11/10/2019.  Sonographer:     Sherrie Sport RDCS (AE) Referring Phys:  [id number] [place] Diagnosing Phys: Ida Rogue MD  PROCEDURE: Consent was requested emergently by emergency room physicain. Local oropharyngeal anesthetic was provided with viscous lidocaine and Benzocaine spray. The transesophogeal probe was passed through the esophogus of the patient. Image quality was  good. The patient's vital signs; including heart rate, blood pressure, and oxygen saturation; remained stable throughout the procedure. The patient developed no complications during the procedure. IMPRESSIONS  1. Left ventricular ejection fraction, by visual estimation, is 55 %. The left ventricle has normal function. There is no left ventricular hypertrophy.  2. The left ventricle is not well visualized.  3. Global right ventricle has normal systolic function.The right ventricular size is normal. No increase in right ventricular wall thickness.  4. Left atrial size was normal.  5. Mitral valve with small calcified opacity at the base of the posterior leaflet, anterior side of the leaflet. Etiology unclear, likely part of calcified posterior leaflet. Mild eccentric mitral valve regurgitation. No evidence of mitral stenosis.  6. TR signal is inadequate for assessing pulmonary artery systolic pressure.  7. No PFO, negative saline contrast bubble study.  8. No LA or LA appendage thrombus. FINDINGS  Left Ventricle: Left ventricular ejection fraction, by visual estimation, is 55 to 60%. The left ventricle has normal function. The left ventricle is not well visualized. There is no left ventricular hypertrophy. Normal left atrial pressure. Right Ventricle: The right ventricular  size is normal. No increase in right ventricular wall thickness. Global RV systolic function is has normal systolic function. Left Atrium: Left atrial size was normal in size. Right Atrium: Right atrial size was normal in size Pericardium: There is no evidence of pericardial effusion. Mitral Valve: The mitral valve is normal in structure. No evidence of mitral valve stenosis by observation. Mild mitral valve regurgitation. Tricuspid Valve: The tricuspid valve is normal in structure. Tricuspid valve regurgitation is mild. Aortic Valve: The aortic valve is normal in structure. Aortic valve regurgitation is not visualized. The aortic valve is structurally normal, with no evidence of sclerosis or stenosis.  Pulmonic Valve: The pulmonic valve was normal in structure. Pulmonic valve regurgitation is not visualized. Aorta: The aortic root, ascending aorta and aortic arch are all structurally normal, with no evidence of dilitation or obstruction. Venous: The inferior vena cava is normal in size with greater than 50% respiratory variability, suggesting right atrial pressure of 3 mmHg. Shunts: Agitated saline contrast was given intravenously to evaluate for intracardiac shunting. There is no evidence of a patent foramen ovale. No ventricular septal defect is seen or detected. There is no evidence of an atrial septal defect. No atrial level shunt detected by color flow Doppler.  Ida Rogue MD Electronically signed by Ida Rogue MD Signature Date/Time: 11/12/2019/6:25:00 PM    Final    CUP PACEART REMOTE DEVICE CHECK  Result Date: 10/19/2019 Carelink summary report received. Battery status OK. Normal device function. No new symptom episodes, tachy episodes, brady, or pause episodes. No new AF episodes. Monthly summary reports and ROV/PRN. SChancey      Subjective: Heart rate stable on the monitor overnight.  Still has some stuttering.  No weakness.  Discharge Exam: Vitals:   11/13/19 0457 11/13/19 0805   BP: (!) 154/69 (!) 152/65  Pulse: (!) 58 (!) 55  Resp: 16 16  Temp: 98.6 F (37 C) 98.1 F (36.7 C)  SpO2: 96% 96%   Vitals:   11/12/19 2010 11/12/19 2350 11/13/19 0457 11/13/19 0805  BP: 140/71 (!) 155/67 (!) 154/69 (!) 152/65  Pulse: (!) 52 (!) 56 (!) 58 (!) 55  Resp: 16 17 16 16   Temp: 98.1 F (36.7 C) 97.9 F (36.6 C) 98.6 F (37 C) 98.1 F (36.7 C)  TempSrc: Oral Oral Oral Oral  SpO2: 97% 94% 96% 96%  Weight:      Height:        General: Elderly female not in distress HEENT: Moist mucosa, supple neck Chest: Clear CVs: Normal S1-S2, no murmurs GI: Soft, nondistended, nontender Musculoskeletal: Warm, no edema CNs: Alert and oriented, stuttering of speech, no focal deficit    The results of significant diagnostics from this hospitalization (including imaging, microbiology, ancillary and laboratory) are listed below for reference.     Microbiology: Recent Results (from the past 240 hour(s))  SARS CORONAVIRUS 2 (TAT 6-24 HRS) Nasopharyngeal Nasopharyngeal Swab     Status: None   Collection Time: 11/10/19 10:11 AM   Specimen: Nasopharyngeal Swab  Result Value Ref Range Status   SARS Coronavirus 2 NEGATIVE NEGATIVE Final    Comment: (NOTE) SARS-CoV-2 target nucleic acids are NOT DETECTED. The SARS-CoV-2 RNA is generally detectable in upper and lower respiratory specimens during the acute phase of infection. Negative results do not preclude SARS-CoV-2 infection, do not rule out co-infections with other pathogens, and should not be used as the sole basis for treatment or other patient management decisions. Negative results must be combined with clinical observations, patient history, and epidemiological information. The expected result is Negative. Fact Sheet for Patients: SugarRoll.be Fact Sheet for Healthcare Providers: https://www.woods-mathews.com/ This test is not yet approved or cleared by the [place] FDA and   has been authorized for detection and/or diagnosis of SARS-CoV-2 by FDA under an Emergency Use Authorization (EUA). This EUA will remain  in effect (meaning this test can be used) for the duration of the COVID-19 declaration under Section 56 4(b)(1) of the Act, 21 U.S.C. section 360bbb-3(b)(1), unless the authorization is terminated or revoked sooner. Performed at [facility], [place] [address], [place], [place] [address]  Labs: BNP (last 3 results) No results for input(s): BNP in the last 8760 hours. Basic Metabolic Panel: Recent Labs  Lab 11/10/19 0828 11/11/19 0541 11/12/19 0402  NA 138 140 140  K 4.0 4.0 3.6  CL 106 107 109  CO2 21* 22 24  GLUCOSE 151* 75 106*  BUN 16 15 14   CREATININE 1.35* 1.23* 1.22*  CALCIUM 9.5 9.2 9.2   Liver Function Tests: Recent Labs  Lab 11/10/19 0828  AST 18  ALT 13  ALKPHOS 68  BILITOT 0.3  PROT 7.3  ALBUMIN 3.7   No results for input(s): LIPASE, AMYLASE in the last 168 hours. No results for input(s): AMMONIA in the last 168 hours. CBC: Recent Labs  Lab 11/10/19 0828 11/11/19 0541 11/12/19 0402  WBC 7.5 6.1 5.8  NEUTROABS 4.3  --   --   HGB 12.3 11.7* 11.6*  HCT 35.3* 34.7* 34.8*  MCV 91.0 94.3 93.0  PLT 240 245 227   Cardiac Enzymes: No results for input(s): CKTOTAL, CKMB, CKMBINDEX, TROPONINI in the last 168 hours. BNP: Invalid input(s): POCBNP CBG: Recent Labs  Lab 11/12/19 1215 11/12/19 1731 11/12/19 2101 11/13/19 0805 11/13/19 1147  GLUCAP 134* 102* 138* 117* 128*   D-Dimer No results for input(s): DDIMER in the last 72 hours. Hgb A1c No results for input(s): HGBA1C in the last 72 hours. Lipid Profile Recent Labs    11/11/19 0541  CHOL 141  HDL 33*  LDLCALC 32  TRIG 379*  CHOLHDL 4.3   Thyroid function studies No results for input(s): TSH, T4TOTAL, T3FREE, THYROIDAB in the last 72 hours.  Invalid input(s): FREET3 Anemia work up No results for input(s): VITAMINB12, FOLATE, FERRITIN,  TIBC, IRON, RETICCTPCT in the last 72 hours. Urinalysis No results found for: COLORURINE, APPEARANCEUR, [place], [place], [place], [place], [place], [place], PROTEINUR, UROBILINOGEN, NITRITE, LEUKOCYTESUR Sepsis Labs Invalid input(s): PROCALCITONIN,  WBC,  LACTICIDVEN Microbiology Recent Results (from the past 240 hour(s))  SARS CORONAVIRUS 2 (TAT 6-24 HRS) Nasopharyngeal Nasopharyngeal Swab     Status: None   Collection Time: 11/10/19 10:11 AM   Specimen: Nasopharyngeal Swab  Result Value Ref Range Status   SARS Coronavirus 2 NEGATIVE NEGATIVE Final    Comment: (NOTE) SARS-CoV-2 target nucleic acids are NOT DETECTED. The SARS-CoV-2 RNA is generally detectable in upper and lower respiratory specimens during the acute phase of infection. Negative results do not preclude SARS-CoV-2 infection, do not rule out co-infections with other pathogens, and should not be used as the sole basis for treatment or other patient management decisions. Negative results must be combined with clinical observations, patient history, and epidemiological information. The expected result is Negative. Fact Sheet for Patients: SugarRoll.be Fact Sheet for Healthcare Providers: https://www.woods-mathews.com/ This test is not yet approved or cleared by the [place] FDA and  has been authorized for detection and/or diagnosis of SARS-CoV-2 by FDA under an Emergency Use Authorization (EUA). This EUA will remain  in effect (meaning this test can be used) for the duration of the COVID-19 declaration under Section 56 4(b)(1) of the Act, 21 U.S.C. section 360bbb-3(b)(1), unless the authorization is terminated or revoked sooner. Performed at [facility], [place] [address], [place], [place] [address]      Time coordinating discharge: 35 minutes  SIGNED:   Louellen Molder, MD  [facility] 11/13/2019, 11:59 AM Pager   If 7PM-7AM, please contact  night-coverage [email or web address] Password [email or web address]

## 2019-11-13 NOTE — TOC Benefit Eligibility Note (Signed)
Transition of Care [facility]) Benefit Eligibility Note    Patient Details  Name: Erin Yates MRN: [id number] Date of Birth: 05/26/1946   Medication/Dose: Eliquis 5 mg BID - ongoing  Covered?: Yes     Prescription Coverage Preferred Pharmacy: [facility], [facility]  Spoke with Person/Company/Phone Number:: Tessie Fass Medicare, [phone] (then transferred to customer service)  Co-Pay: 90 day supply is $125.00  OR  $45 for 30 day supply  Prior Approval: No  Deductible: (Pt. has no deductible)  Additional Notes: Generic Apixaban not on formulary    [place] Phone Number: 11/13/2019, 10:11 AM

## 2019-11-13 NOTE — Progress Notes (Signed)
Occupational Therapy Treatment Patient Details Name: Erin Yates MRN: [id number] DOB: 1946/10/16 Today's Date: 11/13/2019    History of present illness From MD notes: Pt is a 74 y.o. female with a history of diabetes, stroke hypertension, who presents with complaints of difficulty speaking x2 days.  Pt does have a history of stroke in the past but last was 4 years ago.  Apparently has deficits in rightward gaze, peripheral vision, and sensation in her right leg from prior stroke, complains of frontal headache.  Is able to answer yes/no questions.  MD head CT impression includes: Concern for acute infarct at the left temporal-occipital-parietal junction.   OT comments  Pt seen for OT treatment this date to f/u re: OT recommendations for fall prevention as well as to improve safety and independence with dressing skills. Pt with improvement in dressing skills, requiring only MOD I for UB and Supv provided for LB dressing just for clothing mgt over hips. But pt with G static and dynamic standing balance. HHOT f/u could still be beneficial upon d/c for home assessment to offer environmental modification as necessary for fall prevention.    Follow Up Recommendations  Home health OT;Supervision - Intermittent    Equipment Recommendations  3 in 1 bedside commode    Recommendations for Other Services      Precautions / Restrictions Precautions Precautions: Fall Precaution Comments: Low fall Restrictions Weight Bearing Restrictions: No       Mobility Bed Mobility Overal bed mobility: Modified Independent                Transfers Overall transfer level: Needs assistance   Transfers: Sit to/from Stand Sit to Stand: Supervision              Balance Overall balance assessment: Needs assistance Sitting-balance support: Feet supported Sitting balance-Leahy Scale: Normal     Standing balance support: During functional activity;Bilateral upper extremity supported Standing  balance-Leahy Scale: Good Standing balance comment: pt demos G static and dynamic standing balance during dressing tasks.                           ADL either performed or assessed with clinical judgement   ADL Overall ADL's : Needs assistance/impaired                 Upper Body Dressing : Modified independent;Sitting   Lower Body Dressing: Supervision/safety;Sit to/from stand                       Vision Baseline Vision/History: Wears glasses Wears Glasses: Reading only Patient Visual Report: Peripheral vision impairment(from hx CVA x3 yrs ago)     Perception     Praxis      Cognition Arousal/Alertness: Awake/alert Behavior During Therapy: WFL for tasks assessed/performed Overall Cognitive Status: Within Functional Limits for tasks assessed                                 General Comments: some increased processing time, overall appropriate with command following        Exercises Other Exercises Other Exercises: OT facilitates education with pt and family member present in room re: considerations for home for fall prevention including removal of throw rugs and re-arrange furniture for most open/clear pathways. Pt and family member verbalize understanding.   Shoulder Instructions       General Comments  Pertinent Vitals/ Pain       Pain Assessment: No/denies pain  Home Living                                          Prior Functioning/Environment              Frequency  Min 2X/week        Progress Toward Goals  OT Goals(current goals can now be found in the care plan section)  Progress towards OT goals: Progressing toward goals  Acute Rehab OT Goals Patient Stated Goal: to go home OT Goal Formulation: With patient Time For Goal Achievement: 11/25/19 Potential to Achieve Goals: Good  Plan Discharge plan remains appropriate    Co-evaluation                 AM-PAC OT "6  Clicks" Daily Activity     Outcome Measure   Help from another person eating meals?: None Help from another person taking care of personal grooming?: None Help from another person toileting, which includes using toliet, bedpan, or urinal?: A Little Help from another person bathing (including washing, rinsing, drying)?: A Little Help from another person to put on and taking off regular upper body clothing?: None Help from another person to put on and taking off regular lower body clothing?: A Little 6 Click Score: 21    End of Session Equipment Utilized During Treatment: Gait belt;Rolling walker  OT Visit Diagnosis: Other abnormalities of gait and mobility (R26.89);Cognitive communication deficit (R41.841) Symptoms and signs involving cognitive functions: Cerebral infarction   Activity Tolerance Patient tolerated treatment well   Patient Left in bed;with call bell/phone within reach;with family/visitor present   Nurse Communication          Time: [id number] OT Time Calculation (min): 18 min  Charges: OT General Charges $OT Visit: 1 Visit OT Treatments $Self Care/Home Management : 8-22 mins  Gerrianne Scale, [place], OTR/L ascom [phone] 11/13/19, 1:55 PM

## 2019-11-13 NOTE — Progress Notes (Signed)
Order received in [facility] to discharge pt home with Home Health PT and OT today; TOC previously established [place] with [facility]; gave Eliquis coupon to pt for Rx; verbally reviewed AVS with pt, gave Rxs to pt; pt already has scheduled physical exam with Dr [name] next week; no questions voiced at this time; pt discharged via wheelchair by nursing to the [facility] entrance

## 2019-11-13 NOTE — Plan of Care (Signed)
  Problem: Education: Goal: Knowledge of General Education information will improve Description: Including pain rating scale, medication(s)/side effects and non-pharmacologic comfort measures Outcome: Adequate for Discharge   Problem: Health Behavior/Discharge Planning: Goal: Ability to manage health-related needs will improve Outcome: Adequate for Discharge   Problem: Clinical Measurements: Goal: Ability to maintain clinical measurements within normal limits will improve Outcome: Adequate for Discharge Goal: Will remain free from infection Outcome: Adequate for Discharge Goal: Diagnostic test results will improve Outcome: Adequate for Discharge Goal: Respiratory complications will improve Outcome: Adequate for Discharge Goal: Cardiovascular complication will be avoided Outcome: Adequate for Discharge   Problem: Activity: Goal: Risk for activity intolerance will decrease Outcome: Adequate for Discharge   Problem: Nutrition: Goal: Adequate nutrition will be maintained Outcome: Adequate for Discharge   Problem: Coping: Goal: Level of anxiety will decrease Outcome: Adequate for Discharge   Problem: Elimination: Goal: Will not experience complications related to bowel motility Outcome: Adequate for Discharge Goal: Will not experience complications related to urinary retention Outcome: Adequate for Discharge   Problem: Pain Managment: Goal: General experience of comfort will improve Outcome: Adequate for Discharge   Problem: Safety: Goal: Ability to remain free from injury will improve Outcome: Adequate for Discharge   Problem: Skin Integrity: Goal: Risk for impaired skin integrity will decrease Outcome: Adequate for Discharge   Problem: Education: Goal: Knowledge of secondary prevention will improve Outcome: Adequate for Discharge Goal: Knowledge of patient specific risk factors addressed and post discharge goals established will improve Outcome: Adequate for  Discharge   Problem: Coping: Goal: Will identify appropriate support needs Outcome: Adequate for Discharge   Problem: Self-Care: Goal: Ability to participate in self-care as condition permits will improve Outcome: Adequate for Discharge Goal: Verbalization of feelings and concerns over difficulty with self-care will improve Outcome: Adequate for Discharge Goal: Ability to communicate needs accurately will improve Outcome: Adequate for Discharge   Problem: Nutrition: Goal: Risk of aspiration will decrease Outcome: Adequate for Discharge Goal: Dietary intake will improve Outcome: Adequate for Discharge   Problem: Ischemic Stroke/TIA Tissue Perfusion: Goal: Complications of ischemic stroke/TIA will be minimized Outcome: Adequate for Discharge

## 2019-11-13 NOTE — Anesthesia Postprocedure Evaluation (Signed)
Anesthesia Post Note  Patient: Erin Yates  Procedure(s) Performed: TRANSESOPHAGEAL ECHOCARDIOGRAM (TEE) (N/A )  Patient location during evaluation: Specials Recovery Anesthesia Type: General Level of consciousness: awake and alert Pain management: pain level controlled Vital Signs Assessment: post-procedure vital signs reviewed and stable Respiratory status: spontaneous breathing and respiratory function stable Cardiovascular status: stable Anesthetic complications: no     Last Vitals:  Vitals:   11/13/19 0457 11/13/19 0805  BP: (!) 154/69 (!) 152/65  Pulse: (!) 58 (!) 55  Resp: 16 16  Temp: 37 C 36.7 C  SpO2: 96% 96%    Last Pain:  Vitals:   11/13/19 1047  TempSrc:   PainSc: 0-No pain                 Marymargaret Kirker K

## 2019-11-13 NOTE — TOC Progression Note (Signed)
Transition of Care [facility]) - Progression Note    Patient Details  Name: Erin Yates MRN: [id number] Date of Birth: 04-Jan-1946  Transition of Care [facility]) CM/SW Contact  Su Hilt, RN Phone Number: 11/13/2019, 8:44 AM  Clinical Narrative:    Requested Benefit check to get the price of Eliquis  Expected Discharge Plan: [place] Barriers to Discharge: Continued Medical Work up  Expected Discharge Plan and Services Expected Discharge Plan: [place]   Discharge Planning Services: CM Consult Post Acute Care Choice: [place] arrangements for the past 2 months: Single Family Home                           HH Arranged: PT, OT [facility] Agency: [facility] [place] Date [place]: 11/11/19 Time [place]: [place] Representative spoke with at [place]: [place] ([place]) Interventions    Readmission Risk Interventions No flowsheet data found.

## 2019-11-13 NOTE — TOC Transition Note (Signed)
Transition of Care [facility]) - CM/SW Discharge Note   Patient Details  Name: Damariah Cud[place]y MRN: [id number] Date of Birth: 11/16/1945  Transition of Care [facility]) CM/SW Contact:  Su Hilt, RN Phone Number: 11/13/2019, 11:56 AM   Clinical Narrative:    Patient to Discharge with family, Has [facility] HH set up for PT and OT She has DME at home and does not need additional, Provided her with a 30 day coupon for eliquis and the price for the additional months, NO further needs  Final next level of care: Home w Home Health Services Barriers to Discharge: Barriers Resolved   Patient Goals and CMS Choice Patient states their goals for this hospitalization and ongoing recovery are:: To get home CMS Medicare.gov Compare Post Acute Care list provided to:: Patient Choice offered to / list presented to : Patient, Spouse  Discharge Placement                       Discharge Plan and Services   Discharge Planning Services: CM Consult Post Acute Care Choice: Home Health                    HH Arranged: PT, OT [facility] Agency: [facility] Date [place]: 11/11/19 Time [place]: [id number] Representative spoke with at [place]: [place] ([place]) Interventions     Readmission Risk Interventions No flowsheet data found.

## 2019-11-14 ENCOUNTER — Telehealth: Payer: Self-pay | Admitting: Student

## 2019-11-14 NOTE — Telephone Encounter (Signed)
Carelink alert for AF with elevated rates. (Known PAF)  This occurred in the setting of an acute stroke, as patient appears to have been hospitalized at the time.   Pt previously recommended for Eliquis, but did not proceed due to cost. Not-interested in coumadin due to frequent INR checks and dietary restrictions.  Pt seen by Dr. Rockey Situ and Christell Faith, PA-C during that admission. Arranged for Eliquis to start after 7 days of ASA+plavix (to start on 11/20/19), and has follow up with Christell Faith on 1/28 scheduled.   Pt on metoprolol 200 mg daily for rate control.  Will forward to Dr. [name] for [facility] and if any further adjustment needed.

## 2019-11-17 NOTE — Telephone Encounter (Signed)
Noted  

## 2019-11-20 ENCOUNTER — Ambulatory Visit (INDEPENDENT_AMBULATORY_CARE_PROVIDER_SITE_OTHER): Payer: Medicare HMO | Admitting: *Deleted

## 2019-11-20 DIAGNOSIS — I639 Cerebral infarction, unspecified: Secondary | ICD-10-CM | POA: Diagnosis not present

## 2019-11-20 LAB — CUP PACEART REMOTE DEVICE CHECK
Date Time Interrogation Session: 20210114110540
Implantable Pulse Generator Implant Date: 20170530

## 2019-11-21 DIAGNOSIS — I69398 Other sequelae of cerebral infarction: Secondary | ICD-10-CM | POA: Diagnosis not present

## 2019-11-21 DIAGNOSIS — E039 Hypothyroidism, unspecified: Secondary | ICD-10-CM | POA: Diagnosis not present

## 2019-11-21 DIAGNOSIS — E1122 Type 2 diabetes mellitus with diabetic chronic kidney disease: Secondary | ICD-10-CM | POA: Diagnosis not present

## 2019-11-21 DIAGNOSIS — F418 Other specified anxiety disorders: Secondary | ICD-10-CM | POA: Diagnosis not present

## 2019-11-21 DIAGNOSIS — I129 Hypertensive chronic kidney disease with stage 1 through stage 4 chronic kidney disease, or unspecified chronic kidney disease: Secondary | ICD-10-CM | POA: Diagnosis not present

## 2019-11-21 DIAGNOSIS — H539 Unspecified visual disturbance: Secondary | ICD-10-CM | POA: Diagnosis not present

## 2019-11-21 DIAGNOSIS — N183 Chronic kidney disease, stage 3 unspecified: Secondary | ICD-10-CM | POA: Diagnosis not present

## 2019-11-21 DIAGNOSIS — D631 Anemia in chronic kidney disease: Secondary | ICD-10-CM | POA: Diagnosis not present

## 2019-11-21 DIAGNOSIS — R202 Paresthesia of skin: Secondary | ICD-10-CM | POA: Diagnosis not present

## 2019-11-21 NOTE — Progress Notes (Signed)
ILR remote 

## 2019-11-26 DIAGNOSIS — R202 Paresthesia of skin: Secondary | ICD-10-CM | POA: Diagnosis not present

## 2019-11-26 DIAGNOSIS — E1122 Type 2 diabetes mellitus with diabetic chronic kidney disease: Secondary | ICD-10-CM | POA: Diagnosis not present

## 2019-11-26 DIAGNOSIS — N183 Chronic kidney disease, stage 3 unspecified: Secondary | ICD-10-CM | POA: Diagnosis not present

## 2019-11-26 DIAGNOSIS — E1129 Type 2 diabetes mellitus with other diabetic kidney complication: Secondary | ICD-10-CM | POA: Diagnosis not present

## 2019-11-26 DIAGNOSIS — D631 Anemia in chronic kidney disease: Secondary | ICD-10-CM | POA: Diagnosis not present

## 2019-11-26 DIAGNOSIS — E7849 Other hyperlipidemia: Secondary | ICD-10-CM | POA: Diagnosis not present

## 2019-11-26 DIAGNOSIS — E538 Deficiency of other specified B group vitamins: Secondary | ICD-10-CM | POA: Diagnosis not present

## 2019-11-26 DIAGNOSIS — I69398 Other sequelae of cerebral infarction: Secondary | ICD-10-CM | POA: Diagnosis not present

## 2019-11-26 DIAGNOSIS — H539 Unspecified visual disturbance: Secondary | ICD-10-CM | POA: Diagnosis not present

## 2019-11-26 DIAGNOSIS — E039 Hypothyroidism, unspecified: Secondary | ICD-10-CM | POA: Diagnosis not present

## 2019-11-26 DIAGNOSIS — F418 Other specified anxiety disorders: Secondary | ICD-10-CM | POA: Diagnosis not present

## 2019-11-26 DIAGNOSIS — R809 Proteinuria, unspecified: Secondary | ICD-10-CM | POA: Diagnosis not present

## 2019-11-26 DIAGNOSIS — I129 Hypertensive chronic kidney disease with stage 1 through stage 4 chronic kidney disease, or unspecified chronic kidney disease: Secondary | ICD-10-CM | POA: Diagnosis not present

## 2019-11-28 NOTE — Progress Notes (Signed)
Cardiology Office Note    Date:  12/04/2019   ID:  Anthony Schaad, DOB 06-08-1946, MRN [id number]  PCP:  Adin Hector, MD  Cardiologist:  Virl Axe, MD  Electrophysiologist:  Virl Axe, MD   Chief Complaint: Hospital follow-up  History of Present Illness:   Erin Yates is a 74 y.o. female with history of paroxysmal A. fib not compliant with recommended Eliquis in the setting of financial constraints, recurrent/cryptogenic stroke, PFO status post Amplatzer closure in 05/2009 at [facility], CKD stage III, DM, HTN, HLD, hypothyroidism, anxiety, depression, ongoing tobacco use, and GERD  who presents for hospital follow-up as detailed below.  She has a history of multiple strokes, at least 6, and in the setting has undergone remote PFO closure in 05/2009 as well as ILR implant in 03/2016.  Prior TEE in 02/2016 demonstrated well-positioned PFO closure device.  Following ILR implantation A. fib was identified on 06/09/2016 and patient was started on Eliquis.  Patient self discontinued this after several months because of cost.  More recently, she was noted to have increased A. fib burden on her Linq device and was seen in the A. fib clinic on 01/01/2019 with recommendation to resume Eliquis.  She did not start this secondary to cost.  When she was seen by EP on 03/11/2019 it was again recommended she discontinue aspirin and start Eliquis given increased A. fib burden of greater than 12 hours; however she continued aspirin without starting Eliquis, again secondary to cost.  Patient has never explored financial assistance for DOAC.  She was admitted to [facility] from 11/10/2019 through 11/13/2019 with difficulty speaking for 2 days which seemed to initially briefly improve though subsequently worsened and became persistent.  She was not given TPA secondary to outside window.  CT head showed concern for acute infarct at the left temporal occipital parietal junction without demonstratable mass or hemorrhage.  Carotid  artery ultrasound showed mild plaque at the level of both carotid bulbs and paroxysmal internal carotid arteries without significant stenosis along with antegrade flow of the bilateral vertebral arteries.  MRI brain/MRA head/neck showed an acute/subacute nonhemorrhagic infarct involving the left parietal lobe along with remote infarcts involving the occipital poles bilaterally the left frontal operculum and cerebellum along with likely sequela of chronic microvascular ischemia, high-grade stenosis of the proximal left A1 segment, moderate stenosis of the proximal right P2 segment.  Surface echo on 11/10/2019 showed an EF of 55 to 60%, mild LVH, grade 2 diastolic dysfunction, no regional wall motion abnormalities, normal RV systolic function and cavity size, mild mitral valve regurgitation, an ill-defined echodensity adjacent to the posterior leaflet on the atrial side of the mitral valve possibly representing reverberation artifact though unable to exclude calcified mass or partially flail posterior leaflet with calcification with recommendation for TEE.  She underwent TEE on 11/12/2019 which showed an EF of 55%, normal RVSF and cavity size, normal size left atrium, mitral valve with small calcified opacity at the base of the posterior leaflet with etiology being unclear, though felt to be likely part of calcified posterior leaflet, mild eccentric mitral valve regurgitation, no PFO with a negative saline contrast bubble study, no left atrial appendage thrombus.  Neurology evaluated patient with recommendation to start anticoagulation for PAF 7 days post admission.  With regards to the mitral valve calcified opacity, outpatient TTE was recommended.  She comes in doing reasonably well from a cardiac perspective.  No chest pain, dyspnea, falls, hematochezia, or melena.  No dizziness, presyncope, syncope.  Since her discharge she has been quite fatigued though over the past couple days this seems to be somewhat improving.   She has been compliant with Eliquis.  There remains some significant concerns regarding the financial feasibility of this medication moving forward.  Follow-up CBC on anticoagulation demonstrated stable hemoglobin as outlined below.  Blood pressure at PCPs office earlier this week was well controlled at 110/60.   Labs independently reviewed: 11/2019 - Hgb 11.8, PLT 247, potassium 4.4, BUN 18, serum creatinine 1.4, AST/ALT normal, albumin 3.9, A1c 6.7, TC 156, TG 198, HDL 42, LDL 74, TSH normal  Past Medical History:  Diagnosis Date  . Anemia    past  hx  . Anxiety   . Depression   . Diabetes mellitus without complication ([place])   . Dysrhythmia    prior to PFO correction  . GERD (gastroesophageal reflux disease)   . Headache    before PFO closure  . Hypercholesteremia   . Hypertension   . Hypothyroidism   . 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 PFO (patent foramen ovale)    correction device placed 2010  . Right leg numbness    lower leg- residual from CVA  . Seasonal allergies   . Stroke [facility])    prior to PFO correction    Past Surgical History:  Procedure Laterality Date  . ABDOMINAL HYSTERECTOMY    . CARDIAC CATHETERIZATION     [facility]  . CATARACT EXTRACTION W/PHACO Right 09/01/2015   Procedure: CATARACT EXTRACTION PHACO AND INTRAOCULAR LENS PLACEMENT (IOC);  Surgeon: Leandrew Koyanagi, MD;  Location: [place];  Service: Ophthalmology;  Laterality: Right;  DIABETIC - oral meds  . CATARACT EXTRACTION W/PHACO Left 10/20/2015   Procedure: CATARACT EXTRACTION PHACO AND INTRAOCULAR LENS PLACEMENT (IOC);  Surgeon: Leandrew Koyanagi, MD;  Location: [place];  Service: Ophthalmology;  Laterality: Left;  DIABETIC - oral meds  . EP IMPLANTABLE DEVICE N/A 04/04/2016   Procedure: Loop Recorder Insertion;  Surgeon: Deboraha Sprang, MD;  Location: [place] CV LAB;  Service: Cardiovascular;  Laterality: N/A;  . FOOT NEUROMA SURGERY Left   . NASAL SINUS  SURGERY    . PATENT FORAMEN OVALE CLOSURE    . PATENT FORAMEN OVALE CLOSURE    . TEE WITHOUT CARDIOVERSION N/A 02/09/2016   Procedure: TRANSESOPHAGEAL ECHOCARDIOGRAM (TEE);  Surgeon: Adrian Prows, MD;  Location: [place];  Service: Cardiovascular;  Laterality: N/A;  . TEE WITHOUT CARDIOVERSION N/A 11/12/2019   Procedure: TRANSESOPHAGEAL ECHOCARDIOGRAM (TEE);  Surgeon: Minna Merritts, MD;  Location: [facility];  Service: Cardiovascular;  Laterality: N/A;    Current Medications: Current Meds  Medication Sig  . ALPRAZolam (XANAX) 0.25 MG tablet Take 0.25 mg by mouth 2 (two) times daily as needed for anxiety.  Marland Kitchen amLODipine (NORVASC) 10 MG tablet Take 10 mg by mouth daily. PM  . apixaban (ELIQUIS) 5 MG TABS tablet Take 1 tablet (5 mg total) by mouth 2 (two) times daily.  Marland Kitchen aspirin EC 81 MG EC tablet Take 1 tablet (81 mg total) by mouth daily.  Marland Kitchen atorvastatin (LIPITOR) 80 MG tablet Take 80 mg by mouth daily.  Marland Kitchen buPROPion (WELLBUTRIN SR) 150 MG 12 hr tablet Take 1 tablet (150 mg total) by mouth 2 (two) times daily.  . cholecalciferol (VITAMIN D3) 25 MCG (1000 UT) tablet Take 1,000 Units by mouth daily.  . fluticasone (FLONASE) 50 MCG/ACT nasal spray Place into both nostrils daily as needed for  allergies or rhinitis.  . hydrochlorothiazide (MICROZIDE) 12.5 MG capsule Take 12.5 mg by mouth daily.  Marland Kitchen levothyroxine (SYNTHROID, LEVOTHROID) 50 MCG tablet Take 50 mcg by mouth daily before breakfast.  . losartan (COZAAR) 100 MG tablet Take 100 mg by mouth daily. AM  . metFORMIN (GLUCOPHAGE) 1000 MG tablet Take 1,000 mg by mouth 2 (two) times daily with a meal.  . metoprolol (TOPROL-XL) 200 MG 24 hr tablet Take 200 mg by mouth daily.   . pantoprazole (PROTONIX) 40 MG tablet Take 40 mg by mouth 2 (two) times daily. Reported on 04/17/2016  . traZODone (DESYREL) 50 MG tablet Take 50 mg by mouth at bedtime.  . Venlafaxine HCl 225 MG TB24 Take 225 mg by mouth daily with breakfast.   . vitamin B-12  (CYANOCOBALAMIN) 500 MCG tablet Take 500 mcg by mouth daily. AM    Allergies:   Alendronate and Penicillins   Social History   Socioeconomic History  . Marital status: Married    Spouse name: Not on file  . Number of children: Not on file  . Years of education: Not on file  . Highest education level: Not on file  Occupational History  . Not on file  Tobacco Use  . Smoking status: Current Every Day Smoker    Packs/day: 1.00    Years: 55.00    Pack years: 55.00  . Smokeless tobacco: Never Used  Substance and Sexual Activity  . Alcohol use: No  . Drug use: No  . Sexual activity: Not on file  Other Topics Concern  . Not on file  Social History Narrative  . Not on file   Social Determinants of Health   Financial Resource Strain:   . Difficulty of Paying Living Expenses: Not on file  Food Insecurity:   . Worried About Charity fundraiser in the Last Year: Not on file  . Ran Out of Food in the Last Year: Not on file  Transportation Needs:   . Lack of Transportation (Medical): Not on file  . Lack of Transportation (Non-Medical): Not on file  Physical Activity:   . Days of Exercise per Week: Not on file  . Minutes of Exercise per Session: Not on file  Stress:   . Feeling of Stress : Not on file  Social Connections:   . Frequency of Communication with Friends and Family: Not on file  . Frequency of Social Gatherings with Friends and Family: Not on file  . Attends Religious Services: Not on file  . Active Member of Clubs or Organizations: Not on file  . Attends Archivist Meetings: Not on file  . Marital Status: Not on file     Family History:  The patient's family history includes Breast cancer (age of onset: 76) in her sister; Dementia in her father.  ROS:   Review of Systems  Constitutional: Positive for malaise/fatigue. Negative for chills, diaphoresis, fever and weight loss.  HENT: Negative for congestion.   Eyes: Negative for discharge and redness.   Respiratory: Negative for cough, sputum production, shortness of breath and wheezing.   Cardiovascular: Negative for chest pain, palpitations, orthopnea, claudication, leg swelling and PND.  Gastrointestinal: Negative for abdominal pain, blood in stool, heartburn, melena, nausea and vomiting.  Musculoskeletal: Negative for falls and myalgias.  Skin: Negative for rash.  Neurological: Positive for weakness. Negative for dizziness, tingling, tremors, sensory change, speech change, focal weakness and loss of consciousness.  Psychiatric/Behavioral: Negative for substance abuse. The patient is not nervous/anxious.  All other systems reviewed and are negative.    EKGs/Labs/Other Studies Reviewed:    Studies reviewed were summarized above. The additional studies were reviewed today:  2D echo 11/10/2019: 1. Left ventricular ejection fraction, by visual estimation, is 55 to 60%. The left ventricle has normal function. There is mildly increased left ventricular hypertrophy.  2. Elevated left atrial pressure.  3. Left ventricular diastolic parameters are consistent with Grade II diastolic dysfunction (pseudonormalization).  4. The left ventricle has no regional wall motion abnormalities.  5. Global right ventricle has low normal systolic function.The right ventricular size is normal. No increase in right ventricular wall thickness.  6. Left atrial size was normal.  7. Right atrial size was normal.  8. Mild mitral annular calcification.  9. Moderate calcification of the mitral valve leaflet(s). 10. Moderate thickening of the mitral valve leaflet(s). 11. The mitral valve is degenerative. Mild mitral valve regurgitation. No evidence of mitral stenosis. 12. There is an ill-defined echodensity adjacent to the posterior leaflet on the atrial side of the mitral valve. While this could represent reverberation artifact, there is the possibility of a calcified mass or partially flail posterior leaflet with   calcification. This could be further evaluated by TEE, as clinically indicated. 13. The tricuspid valve is normal in structure. 14. The aortic valve was not well visualized. Aortic valve regurgitation is not visualized. No evidence of aortic valve sclerosis or stenosis. 15. The pulmonic valve was normal in structure. Pulmonic valve regurgitation is trivial. 16. Mildly dilated pulmonary artery. 17. TR signal is inadequate for assessing pulmonary artery systolic pressure. 18. The inferior vena cava is normal in size with greater than 50% respiratory variability, suggesting right atrial pressure of 3 mmHg. 19. The interatrial septum was not well visualized. __________  TEE 11/12/2019: 1. Left ventricular ejection fraction, by visual estimation, is 55 %. The left ventricle has normal function. There is no left ventricular hypertrophy.  2. The left ventricle is not well visualized.  3. Global right ventricle has normal systolic function.The right ventricular size is normal. No increase in right ventricular wall thickness.  4. Left atrial size was normal.  5. Mitral valve with small calcified opacity at the base of the posterior leaflet, anterior side of the leaflet. Etiology unclear, likely part of calcified posterior leaflet. Mild eccentric mitral valve regurgitation. No evidence of mitral stenosis.  6. TR signal is inadequate for assessing pulmonary artery systolic pressure.  7. No PFO, negative saline contrast bubble study.  8. No LA or LA appendage thrombus.   EKG:  EKG is ordered today.  The EKG ordered today demonstrates sinus bradycardia, 50 bpm, first-degree AV block, left axis deviation, poor R wave progression on precordial leads, LVH with early repolarization abnormality  Recent Labs: 11/10/2019: ALT 13 11/12/2019: BUN 14; Creatinine, Ser 1.22; Hemoglobin 11.6; Platelets 227; Potassium 3.6; Sodium 140  Recent Lipid Panel    Component Value Date/Time   CHOL 141 11/11/2019 0541   TRIG  379 (H) 11/11/2019 0541   HDL 33 (L) 11/11/2019 0541   CHOLHDL 4.3 11/11/2019 0541   VLDL 76 (H) 11/11/2019 0541   LDLCALC 32 11/11/2019 0541    PHYSICAL EXAM:    VS:  BP 140/62   Pulse (!) 50   Ht 5\' 5"  (1.651 m)   Wt 134 lb 8 oz (61 kg)   BMI 22.38 kg/m   BMI: Body mass index is 22.38 kg/m.  Physical Exam  Constitutional: She is oriented to person, place, and time. She  appears well-developed and well-nourished.  HENT:  Head: Normocephalic and atraumatic.  Eyes: Right eye exhibits no discharge. Left eye exhibits no discharge.  Neck: No JVD present.  Cardiovascular: Regular rhythm, S1 normal, S2 normal and normal heart sounds. Bradycardia present. Exam reveals no distant heart sounds, no friction rub, no midsystolic click and no opening snap.  No murmur heard. Pulses:      Posterior tibial pulses are 2+ on the right side and 2+ on the left side.  Pulmonary/Chest: Effort normal and breath sounds normal. No respiratory distress. She has no decreased breath sounds. She has no wheezes. She has no rales. She exhibits no tenderness.  Abdominal: Soft. She exhibits no distension. There is no abdominal tenderness.  Musculoskeletal:        General: No edema.     Cervical back: Normal range of motion.  Neurological: She is alert and oriented to person, place, and time.  Skin: Skin is warm and dry. No cyanosis. Nails show no clubbing.  Psychiatric: She has a normal mood and affect. Her speech is normal and behavior is normal. Judgment and thought content normal.    Wt Readings from Last 3 Encounters:  12/04/19 134 lb 8 oz (61 kg)  11/10/19 132 lb 4.4 oz (60 kg)  08/06/19 134 lb (60.8 kg)     ASSESSMENT & PLAN:   1. PAF: Maintaining sinus rhythm with bradycardic heart rate.  Decrease Toprol-XL to 100 mg daily.  CHADS2VASc at least 6 (HTN, age x 1, vascular disease, stroke x 2, sex category).  In the past, she has not been compliant with [place] secondary to financial constraints.  She  presents with her daughter today and it appears there are still some significant financial barriers with DOAC.  We have supplied patient with financial assistance forms for Eliquis and provided samples, if able.  If she does not qualify for patient assistance with Eliquis we will need to see if Xarelto is any more affordable or if she qualifies for assistance.  If she cannot afford any of the DOACs we will need to place her on warfarin moving forward in an effort to reduce CVA risk.  No symptoms concerning for bleeding.  Recent CBC demonstrated stable hemoglobin.  We will have her follow-up with EP for removal of her Linq prior to undergoing cardiac MRI.  2. Mitral valve disorder: Of uncertain etiology despite surface echo and TEE, though suspected calcified mass versus heavily calcified posterior leaflet has been noted.  In the setting of the patient's recurrent CVA plan for cardiac MRI, this will need to be undertaken after the removal of her Linq device.  3. Recurrent CVA: Patient has had at least 6 possibly 7 CVAs with documented PAF on loop recorder with recommendation to start Eliquis dating back to 2017 as outlined above.  She has been compliant with Eliquis since hospital discharge.  Given it is presumed that her CVAs have been in the setting of A. fib, as noted by neurology, we will discontinue aspirin and continue her on DOAC as outlined above.  Aggressive resector modification.  Follow-up with neurology as directed.  Not currently on statin, this can be revisited in follow-up.  4. PFO: Stable on recent TEE.  5. Sinus bradycardia/first-degree AV block: Likely playing a role in the patient's underlying fatigue.  Decrease Toprol-XL to 100 mg daily.  6. Tobacco use: Complete cessation is recommended.  7. HTN: BP is mildly elevated today at 140/62 however was well controlled at PCPs office earlier  this week at 110/60.  With the decrease in her Toprol-XL as above secondary to bradycardia and  first-degree AV block she will keep an eye on her blood pressure and if it is consistently running greater than 140/90 she will let us know.  Otherwise, she will continue current dose of amlodipine, HCTZ, losartan, and reduced dose of Toprol.  Disposition: F/u with Dr. [name] or APP in 2 months.   Medication Adjustments/Labs and Tests Ordered: Current medicines are reviewed at length with the patient today.  Concerns regarding medicines are outlined above. Medication changes, Labs and Tests ordered today are summarized above and listed in the Patient Instructions accessible in Encounters.   Signed, Christell Faith, PA-C 12/04/2019 11:57 AM     [facility] [address] [place] [address] [place], [place] [address] [phone]

## 2019-12-02 DIAGNOSIS — I129 Hypertensive chronic kidney disease with stage 1 through stage 4 chronic kidney disease, or unspecified chronic kidney disease: Secondary | ICD-10-CM | POA: Diagnosis not present

## 2019-12-02 DIAGNOSIS — R202 Paresthesia of skin: Secondary | ICD-10-CM | POA: Diagnosis not present

## 2019-12-02 DIAGNOSIS — E1122 Type 2 diabetes mellitus with diabetic chronic kidney disease: Secondary | ICD-10-CM | POA: Diagnosis not present

## 2019-12-02 DIAGNOSIS — F418 Other specified anxiety disorders: Secondary | ICD-10-CM | POA: Diagnosis not present

## 2019-12-02 DIAGNOSIS — E039 Hypothyroidism, unspecified: Secondary | ICD-10-CM | POA: Diagnosis not present

## 2019-12-02 DIAGNOSIS — I69398 Other sequelae of cerebral infarction: Secondary | ICD-10-CM | POA: Diagnosis not present

## 2019-12-02 DIAGNOSIS — N183 Chronic kidney disease, stage 3 unspecified: Secondary | ICD-10-CM | POA: Diagnosis not present

## 2019-12-02 DIAGNOSIS — D631 Anemia in chronic kidney disease: Secondary | ICD-10-CM | POA: Diagnosis not present

## 2019-12-02 DIAGNOSIS — H539 Unspecified visual disturbance: Secondary | ICD-10-CM | POA: Diagnosis not present

## 2019-12-03 DIAGNOSIS — K219 Gastro-esophageal reflux disease without esophagitis: Secondary | ICD-10-CM | POA: Diagnosis not present

## 2019-12-03 DIAGNOSIS — D692 Other nonthrombocytopenic purpura: Secondary | ICD-10-CM | POA: Diagnosis not present

## 2019-12-03 DIAGNOSIS — E034 Atrophy of thyroid (acquired): Secondary | ICD-10-CM | POA: Diagnosis not present

## 2019-12-03 DIAGNOSIS — I10 Essential (primary) hypertension: Secondary | ICD-10-CM | POA: Diagnosis not present

## 2019-12-03 DIAGNOSIS — I480 Paroxysmal atrial fibrillation: Secondary | ICD-10-CM | POA: Diagnosis not present

## 2019-12-03 DIAGNOSIS — R809 Proteinuria, unspecified: Secondary | ICD-10-CM | POA: Diagnosis not present

## 2019-12-03 DIAGNOSIS — E1151 Type 2 diabetes mellitus with diabetic peripheral angiopathy without gangrene: Secondary | ICD-10-CM | POA: Diagnosis not present

## 2019-12-03 DIAGNOSIS — Z7901 Long term (current) use of anticoagulants: Secondary | ICD-10-CM | POA: Diagnosis not present

## 2019-12-03 DIAGNOSIS — E1129 Type 2 diabetes mellitus with other diabetic kidney complication: Secondary | ICD-10-CM | POA: Diagnosis not present

## 2019-12-04 ENCOUNTER — Encounter: Payer: Self-pay | Admitting: Physician Assistant

## 2019-12-04 ENCOUNTER — Ambulatory Visit (INDEPENDENT_AMBULATORY_CARE_PROVIDER_SITE_OTHER): Payer: Medicare HMO | Admitting: Physician Assistant

## 2019-12-04 ENCOUNTER — Other Ambulatory Visit: Payer: Self-pay

## 2019-12-04 VITALS — BP 140/62 | HR 50 | Ht 65.0 in | Wt 134.5 lb

## 2019-12-04 DIAGNOSIS — R001 Bradycardia, unspecified: Secondary | ICD-10-CM

## 2019-12-04 DIAGNOSIS — Q2112 Patent foramen ovale: Secondary | ICD-10-CM

## 2019-12-04 DIAGNOSIS — I480 Paroxysmal atrial fibrillation: Secondary | ICD-10-CM | POA: Diagnosis not present

## 2019-12-04 DIAGNOSIS — I059 Rheumatic mitral valve disease, unspecified: Secondary | ICD-10-CM | POA: Diagnosis not present

## 2019-12-04 DIAGNOSIS — I440 Atrioventricular block, first degree: Secondary | ICD-10-CM

## 2019-12-04 DIAGNOSIS — I639 Cerebral infarction, unspecified: Secondary | ICD-10-CM | POA: Diagnosis not present

## 2019-12-04 DIAGNOSIS — Q211 Atrial septal defect: Secondary | ICD-10-CM

## 2019-12-04 DIAGNOSIS — I10 Essential (primary) hypertension: Secondary | ICD-10-CM

## 2019-12-04 MED ORDER — METOPROLOL SUCCINATE ER 100 MG PO TB24: 100.0000 mg | ORAL_TABLET | Freq: Every day | ORAL | 3 refills | Status: DC

## 2019-12-04 NOTE — Patient Instructions (Signed)
Medication Instructions:  Your physician has recommended you make the following change in your medication: Decrease your Toprol to 100 mg daily. Take Eliquis 5 mg twice daily.  *If you need a refill on your cardiac medications before your next appointment, please call your pharmacy*  Lab Work: You need a BMET at [facility] [employer] now.  If you have labs (blood work) drawn today and your tests are completely normal, you will receive your results only by: Marland Kitchen MyChart Message (if you have MyChart) OR . A paper copy in the mail If you have any lab test that is abnormal or we need to change your treatment, we will call you to review the results.  Testing/Procedures: You are scheduled for Cardiac MRI on ________________. Please arrive at the [facility] main entrance of [facility] at _______ (30-45 minutes prior to test start time). ?  [facility]  [address]  [place], [place] [address]  [phone]  Proceed to the [facility] Radiology Department (First Floor).  ?  Magnetic resonance imaging (MRI) is a painless test that produces images of the inside of the body without using X-rays. During an MRI, strong magnets and radio waves work together in a [employer], political party to form detailed images. MRI images may provide more details about a medical condition than X-rays, CT scans, and ultrasounds can provide.  You may be given earphones to listen for instructions.  You may eat a light breakfast and take medications as ordered with the exception of HCTZ (fluid pill, other). If a contrast material will be used, an IV will be inserted into one of your veins. Contrast material will be injected into your IV.  You will be asked to remove all metal, including: Watch, jewelry, and other metal objects including hearing aids, hair pieces and dentures. (Braces and fillings normally are not a problem.)  If contrast material was used:  It will leave your body through your urine within a day. You  may be told to drink plenty of fluids to help flush the contrast material out of your system.  TEST WILL TAKE APPROXIMATELY 1 HOUR  PLEASE NOTIFY SCHEDULING AT LEAST 24 HOURS IN ADVANCE IF YOU ARE UNABLE TO KEEP YOUR APPOINTMENT.     Follow-Up: At [facility], you and your health needs are our priority.  As part of our continuing mission to provide you with exceptional heart care, we have created designated Provider Care Teams.  These Care Teams include your primary Cardiologist (physician) and Advanced Practice Providers (APPs -  Physician Assistants and Nurse Practitioners) who all work together to provide you with the care you need, when you need it.  Your next appointment:   2 month(s)  The format for your next appointment:   In Person  Provider:    Please see Virl Axe, MD to get your Loop recorder removed.      Other Instructions @ boxes of Eliquis were provided to the pt.  [id number] exp.11/2021

## 2019-12-05 DIAGNOSIS — D631 Anemia in chronic kidney disease: Secondary | ICD-10-CM | POA: Diagnosis not present

## 2019-12-05 DIAGNOSIS — I69398 Other sequelae of cerebral infarction: Secondary | ICD-10-CM | POA: Diagnosis not present

## 2019-12-05 DIAGNOSIS — R202 Paresthesia of skin: Secondary | ICD-10-CM | POA: Diagnosis not present

## 2019-12-05 DIAGNOSIS — N183 Chronic kidney disease, stage 3 unspecified: Secondary | ICD-10-CM | POA: Diagnosis not present

## 2019-12-05 DIAGNOSIS — I129 Hypertensive chronic kidney disease with stage 1 through stage 4 chronic kidney disease, or unspecified chronic kidney disease: Secondary | ICD-10-CM | POA: Diagnosis not present

## 2019-12-05 DIAGNOSIS — E039 Hypothyroidism, unspecified: Secondary | ICD-10-CM | POA: Diagnosis not present

## 2019-12-05 DIAGNOSIS — F418 Other specified anxiety disorders: Secondary | ICD-10-CM | POA: Diagnosis not present

## 2019-12-05 DIAGNOSIS — E1122 Type 2 diabetes mellitus with diabetic chronic kidney disease: Secondary | ICD-10-CM | POA: Diagnosis not present

## 2019-12-05 DIAGNOSIS — H539 Unspecified visual disturbance: Secondary | ICD-10-CM | POA: Diagnosis not present

## 2019-12-08 DIAGNOSIS — E039 Hypothyroidism, unspecified: Secondary | ICD-10-CM | POA: Diagnosis not present

## 2019-12-08 DIAGNOSIS — I69398 Other sequelae of cerebral infarction: Secondary | ICD-10-CM | POA: Diagnosis not present

## 2019-12-08 DIAGNOSIS — R202 Paresthesia of skin: Secondary | ICD-10-CM | POA: Diagnosis not present

## 2019-12-08 DIAGNOSIS — H539 Unspecified visual disturbance: Secondary | ICD-10-CM | POA: Diagnosis not present

## 2019-12-08 DIAGNOSIS — I129 Hypertensive chronic kidney disease with stage 1 through stage 4 chronic kidney disease, or unspecified chronic kidney disease: Secondary | ICD-10-CM | POA: Diagnosis not present

## 2019-12-08 DIAGNOSIS — E1122 Type 2 diabetes mellitus with diabetic chronic kidney disease: Secondary | ICD-10-CM | POA: Diagnosis not present

## 2019-12-08 DIAGNOSIS — N183 Chronic kidney disease, stage 3 unspecified: Secondary | ICD-10-CM | POA: Diagnosis not present

## 2019-12-08 DIAGNOSIS — D631 Anemia in chronic kidney disease: Secondary | ICD-10-CM | POA: Diagnosis not present

## 2019-12-08 DIAGNOSIS — F418 Other specified anxiety disorders: Secondary | ICD-10-CM | POA: Diagnosis not present

## 2019-12-10 DIAGNOSIS — F418 Other specified anxiety disorders: Secondary | ICD-10-CM | POA: Diagnosis not present

## 2019-12-10 DIAGNOSIS — D631 Anemia in chronic kidney disease: Secondary | ICD-10-CM | POA: Diagnosis not present

## 2019-12-10 DIAGNOSIS — R202 Paresthesia of skin: Secondary | ICD-10-CM | POA: Diagnosis not present

## 2019-12-10 DIAGNOSIS — E039 Hypothyroidism, unspecified: Secondary | ICD-10-CM | POA: Diagnosis not present

## 2019-12-10 DIAGNOSIS — N183 Chronic kidney disease, stage 3 unspecified: Secondary | ICD-10-CM | POA: Diagnosis not present

## 2019-12-10 DIAGNOSIS — I129 Hypertensive chronic kidney disease with stage 1 through stage 4 chronic kidney disease, or unspecified chronic kidney disease: Secondary | ICD-10-CM | POA: Diagnosis not present

## 2019-12-10 DIAGNOSIS — E1122 Type 2 diabetes mellitus with diabetic chronic kidney disease: Secondary | ICD-10-CM | POA: Diagnosis not present

## 2019-12-10 DIAGNOSIS — I69398 Other sequelae of cerebral infarction: Secondary | ICD-10-CM | POA: Diagnosis not present

## 2019-12-10 DIAGNOSIS — H539 Unspecified visual disturbance: Secondary | ICD-10-CM | POA: Diagnosis not present

## 2019-12-11 DIAGNOSIS — H539 Unspecified visual disturbance: Secondary | ICD-10-CM | POA: Diagnosis not present

## 2019-12-11 DIAGNOSIS — R202 Paresthesia of skin: Secondary | ICD-10-CM | POA: Diagnosis not present

## 2019-12-11 DIAGNOSIS — E039 Hypothyroidism, unspecified: Secondary | ICD-10-CM | POA: Diagnosis not present

## 2019-12-11 DIAGNOSIS — I69398 Other sequelae of cerebral infarction: Secondary | ICD-10-CM | POA: Diagnosis not present

## 2019-12-11 DIAGNOSIS — D631 Anemia in chronic kidney disease: Secondary | ICD-10-CM | POA: Diagnosis not present

## 2019-12-11 DIAGNOSIS — I129 Hypertensive chronic kidney disease with stage 1 through stage 4 chronic kidney disease, or unspecified chronic kidney disease: Secondary | ICD-10-CM | POA: Diagnosis not present

## 2019-12-11 DIAGNOSIS — E1122 Type 2 diabetes mellitus with diabetic chronic kidney disease: Secondary | ICD-10-CM | POA: Diagnosis not present

## 2019-12-11 DIAGNOSIS — N183 Chronic kidney disease, stage 3 unspecified: Secondary | ICD-10-CM | POA: Diagnosis not present

## 2019-12-11 DIAGNOSIS — F418 Other specified anxiety disorders: Secondary | ICD-10-CM | POA: Diagnosis not present

## 2019-12-14 ENCOUNTER — Emergency Department
Admission: EM | Admit: 2019-12-14 | Discharge: 2019-12-14 | Disposition: A | Discharge status: Home / Self Care | Payer: Medicare HMO | Attending: Emergency Medicine | Admitting: Emergency Medicine

## 2019-12-14 ENCOUNTER — Encounter: Payer: Self-pay | Admitting: Emergency Medicine

## 2019-12-14 ENCOUNTER — Other Ambulatory Visit: Payer: Self-pay

## 2019-12-14 DIAGNOSIS — E039 Hypothyroidism, unspecified: Secondary | ICD-10-CM | POA: Insufficient documentation

## 2019-12-14 DIAGNOSIS — Z79899 Other long term (current) drug therapy: Secondary | ICD-10-CM | POA: Diagnosis not present

## 2019-12-14 DIAGNOSIS — L03113 Cellulitis of right upper limb: Secondary | ICD-10-CM | POA: Insufficient documentation

## 2019-12-14 DIAGNOSIS — E119 Type 2 diabetes mellitus without complications: Secondary | ICD-10-CM | POA: Insufficient documentation

## 2019-12-14 DIAGNOSIS — Z8673 Personal history of transient ischemic attack (TIA), and cerebral infarction without residual deficits: Secondary | ICD-10-CM | POA: Diagnosis not present

## 2019-12-14 DIAGNOSIS — Z7982 Long term (current) use of aspirin: Secondary | ICD-10-CM | POA: Insufficient documentation

## 2019-12-14 DIAGNOSIS — I10 Essential (primary) hypertension: Secondary | ICD-10-CM | POA: Insufficient documentation

## 2019-12-14 DIAGNOSIS — F17210 Nicotine dependence, cigarettes, uncomplicated: Secondary | ICD-10-CM | POA: Insufficient documentation

## 2019-12-14 DIAGNOSIS — M79631 Pain in right forearm: Secondary | ICD-10-CM | POA: Diagnosis present

## 2019-12-14 DIAGNOSIS — Z7984 Long term (current) use of oral hypoglycemic drugs: Secondary | ICD-10-CM | POA: Insufficient documentation

## 2019-12-14 LAB — COMPREHENSIVE METABOLIC PANEL
ALT: 15 U/L (ref 0–44)
AST: 20 U/L (ref 15–41)
Albumin: 3.6 g/dL (ref 3.5–5.0)
Alkaline Phosphatase: 70 U/L (ref 38–126)
Anion gap: 12 (ref 5–15)
BUN: 14 mg/dL (ref 8–23)
CO2: 20 mmol/L — ABNORMAL LOW (ref 22–32)
Calcium: 9.3 mg/dL (ref 8.9–10.3)
Chloride: 105 mmol/L (ref 98–111)
Creatinine, Ser: 1.42 mg/dL — ABNORMAL HIGH (ref 0.44–1.00)
GFR calc Af Amer: 42 mL/min — ABNORMAL LOW (ref >60)
GFR calc non Af Amer: 37 mL/min — ABNORMAL LOW (ref >60)
Glucose, Bld: 218 mg/dL — ABNORMAL HIGH (ref 70–99)
Potassium: 4.2 mmol/L (ref 3.5–5.1)
Sodium: 137 mmol/L (ref 135–145)
Total Bilirubin: 0.4 mg/dL (ref 0.3–1.2)
Total Protein: 7.4 g/dL (ref 6.5–8.1)

## 2019-12-14 LAB — CBC WITH DIFFERENTIAL/PLATELET
Abs Immature Granulocytes: 0.02 10*3/uL (ref 0.00–0.07)
Basophils Absolute: 0 10*3/uL (ref 0.0–0.1)
Basophils Relative: 0 %
Eosinophils Absolute: 0 10*3/uL (ref 0.0–0.5)
Eosinophils Relative: 0 %
HCT: 35.8 % — ABNORMAL LOW (ref 36.0–46.0)
Hemoglobin: 11.8 g/dL — ABNORMAL LOW (ref 12.0–15.0)
Immature Granulocytes: 0 %
Lymphocytes Relative: 19 %
Lymphs Abs: 1.8 10*3/uL (ref 0.7–4.0)
MCH: 31.6 pg (ref 26.0–34.0)
MCHC: 33 g/dL (ref 30.0–36.0)
MCV: 95.7 fL (ref 80.0–100.0)
Monocytes Absolute: 0.8 10*3/uL (ref 0.1–1.0)
Monocytes Relative: 8 %
Neutro Abs: 6.9 10*3/uL (ref 1.7–7.7)
Neutrophils Relative %: 73 %
Platelets: 210 10*3/uL (ref 150–400)
RBC: 3.74 MIL/uL — ABNORMAL LOW (ref 3.87–5.11)
RDW: 13.5 % (ref 11.5–15.5)
WBC: 9.6 10*3/uL (ref 4.0–10.5)
nRBC: 0 % (ref 0.0–0.2)

## 2019-12-14 LAB — URINALYSIS, COMPLETE (UACMP) WITH MICROSCOPIC
Bacteria, UA: NONE SEEN
Bilirubin Urine: NEGATIVE
Glucose, UA: NEGATIVE mg/dL
Hgb urine dipstick: NEGATIVE
Ketones, ur: NEGATIVE mg/dL
Leukocytes,Ua: NEGATIVE
Nitrite: NEGATIVE
Protein, ur: NEGATIVE mg/dL
Specific Gravity, Urine: 1.006 (ref 1.005–1.030)
pH: 5 (ref 5.0–8.0)

## 2019-12-14 LAB — LACTIC ACID, PLASMA: Lactic Acid, Venous: 1.9 mmol/L (ref 0.5–1.9)

## 2019-12-14 MED ORDER — SULFAMETHOXAZOLE-TRIMETHOPRIM 800-160 MG PO TABS: 1.0000 | ORAL_TABLET | Freq: Two times a day (BID) | ORAL | 0 refills | Status: DC

## 2019-12-14 MED ORDER — SODIUM CHLORIDE 0.9 % IV SOLN
1.0000 g | Freq: Once | INTRAVENOUS | Status: AC
  Administered 2019-12-14: 1 g via INTRAVENOUS
  Filled 2019-12-14: qty 10

## 2019-12-14 MED ORDER — SODIUM CHLORIDE 0.9% FLUSH: 3.0000 mL | Freq: Once | INTRAVENOUS | Status: DC

## 2019-12-14 MED ORDER — TRAMADOL HCL 50 MG PO TABS: 50.0000 mg | ORAL_TABLET | Freq: Two times a day (BID) | ORAL | 0 refills | Status: DC | PRN

## 2019-12-14 MED ORDER — CEFAZOLIN SODIUM-DEXTROSE 1-4 GM/50ML-% IV SOLN: 1.0000 g | Freq: Once | INTRAVENOUS | Status: DC

## 2019-12-14 MED ORDER — BENZOYL PEROXIDE 5 % EX GEL: Freq: Two times a day (BID) | CUTANEOUS | Status: DC

## 2019-12-14 NOTE — ED Triage Notes (Signed)
Pt presents to ED via POV with c/o R arm pain, pt with area of redness that is warm to touch, pt states started approx 2 days ago. Pt states within the last 2 days has developed fever and diarrhea as well as pain to site. Pt a-febrile upon arrival, states took 1,000mg  approx 1hr PTA. Pt states max temp at home 101 at home.

## 2019-12-14 NOTE — ED Provider Notes (Signed)
[facility] Emergency Department Provider Note ____________________________________________   First MD Initiated Contact with Patient 12/14/19 1200     (approximate)  I have reviewed the triage vital signs and the nursing notes.   HISTORY  Chief Complaint Diarrhea, Fever, and Arm Pain  HPI Erin Yates is a 74 y.o. female presents to the emergency department for treatment and evaluation of pain and swelling in the right forearm.  Patient denies history of MRSA or skin infection.  She was here in the beginning of January after CVA.  She seems to recall that she did have an IV in the same area but did not have any symptoms of infection until 2 to 3 days ago.  She has noticed a fever up to 101 at home.  She took a gram of Tylenol prior to arrival.         Past Medical History:  Diagnosis Date  . Anemia    past  hx  . Anxiety   . Depression   . Diabetes mellitus without complication ([place])   . Dysrhythmia    prior to PFO correction  . GERD (gastroesophageal reflux disease)   . Headache    before PFO closure  . Hypercholesteremia   . Hypertension   . Hypothyroidism   . 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 PFO (patent foramen ovale)    correction device placed 2010  . Right leg numbness    lower leg- residual from CVA  . Seasonal allergies   . Stroke [facility])    prior to PFO correction    Patient Active Problem List   Diagnosis Date Noted  . AF (paroxysmal atrial fibrillation) ([place]) 11/13/2019  . CVA (cerebral vascular accident) ([place]) 11/11/2019  . Stroke ([place]) 11/10/2019  . 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 Depression   . GERD (gastroesophageal reflux disease)   . Hypertension   . Hypothyroidism   . Hypercholesteremia   . Tobacco abuse   . Cryptogenic stroke ([place]) 03/27/2016  . Stroke (cerebrum) ([place])   . CVA (cerebral infarction) 02/07/2016    Past Surgical History:  Procedure  Laterality Date  . ABDOMINAL HYSTERECTOMY    . CARDIAC CATHETERIZATION     [facility]  . CATARACT EXTRACTION W/PHACO Right 09/01/2015   Procedure: CATARACT EXTRACTION PHACO AND INTRAOCULAR LENS PLACEMENT (IOC);  Surgeon: Leandrew Koyanagi, MD;  Location: [place];  Service: Ophthalmology;  Laterality: Right;  DIABETIC - oral meds  . CATARACT EXTRACTION W/PHACO Left 10/20/2015   Procedure: CATARACT EXTRACTION PHACO AND INTRAOCULAR LENS PLACEMENT (IOC);  Surgeon: Leandrew Koyanagi, MD;  Location: [place];  Service: Ophthalmology;  Laterality: Left;  DIABETIC - oral meds  . EP IMPLANTABLE DEVICE N/A 04/04/2016   Procedure: Loop Recorder Insertion;  Surgeon: Deboraha Sprang, MD;  Location: [place] CV LAB;  Service: Cardiovascular;  Laterality: N/A;  . FOOT NEUROMA SURGERY Left   . NASAL SINUS SURGERY    . PATENT FORAMEN OVALE CLOSURE    . PATENT FORAMEN OVALE CLOSURE    . TEE WITHOUT CARDIOVERSION N/A 02/09/2016   Procedure: TRANSESOPHAGEAL ECHOCARDIOGRAM (TEE);  Surgeon: Adrian Prows, MD;  Location: [place];  Service: Cardiovascular;  Laterality: N/A;  . TEE WITHOUT CARDIOVERSION N/A 11/12/2019   Procedure: TRANSESOPHAGEAL ECHOCARDIOGRAM (TEE);  Surgeon: Minna Merritts, MD;  Location: [facility];  Service: Cardiovascular;  Laterality: N/A;    Prior to Admission medications   Medication Sig Start  Date End Date Taking? Authorizing Provider  ALPRAZolam (XANAX) 0.25 MG tablet Take 0.25 mg by mouth 2 (two) times daily as needed for anxiety.    [provider]  amLODipine (NORVASC) 10 MG tablet Take 10 mg by mouth daily. PM    [provider]  apixaban (ELIQUIS) 5 MG TABS tablet Take 1 tablet (5 mg total) by mouth 2 (two) times daily. 11/19/19   Dhungel, Flonnie Overman, MD  aspirin EC 81 MG EC tablet Take 1 tablet (81 mg total) by mouth daily. 11/14/19   Dhungel, Flonnie Overman, MD  atorvastatin (LIPITOR) 80 MG tablet Take 80 mg by mouth daily.    [provider]    buPROPion (WELLBUTRIN SR) 150 MG 12 hr tablet Take 1 tablet (150 mg total) by mouth 2 (two) times daily. 03/27/16   Garvin Fila, MD  cholecalciferol (VITAMIN D3) 25 MCG (1000 UT) tablet Take 1,000 Units by mouth daily.    [provider]  fluticasone (FLONASE) 50 MCG/ACT nasal spray Place into both nostrils daily as needed for allergies or rhinitis.    [provider]  hydrochlorothiazide (MICROZIDE) 12.5 MG capsule Take 12.5 mg by mouth daily.    [provider]  levothyroxine (SYNTHROID, LEVOTHROID) 50 MCG tablet Take 50 mcg by mouth daily before breakfast.    [provider]  losartan (COZAAR) 100 MG tablet Take 100 mg by mouth daily. AM    [provider]  metFORMIN (GLUCOPHAGE) 1000 MG tablet Take 1,000 mg by mouth 2 (two) times daily with a meal.    [provider]  metoprolol succinate (TOPROL-XL) 100 MG 24 hr tablet Take 1 tablet (100 mg total) by mouth daily. 12/04/19   Dunn, Areta Haber, PA-C  nicotine (NICODERM CQ - DOSED IN MG/24 HOURS) 21 mg/24hr patch Place 1 patch (21 mg total) onto the skin daily. Patient not taking: Reported on 12/04/2019 11/14/19   Dhungel, Flonnie Overman, MD  pantoprazole (PROTONIX) 40 MG tablet Take 40 mg by mouth 2 (two) times daily. Reported on 04/17/2016    [provider]  sulfamethoxazole-trimethoprim (BACTRIM DS) 800-160 MG tablet Take 1 tablet by mouth 2 (two) times daily. 12/14/19   Adarius Tigges, Johnette Abraham B, FNP  traMADol (ULTRAM) 50 MG tablet Take 1 tablet (50 mg total) by mouth every 12 (twelve) hours as needed. 12/14/19   Anselm Aumiller, Johnette Abraham B, FNP  traZODone (DESYREL) 50 MG tablet Take 50 mg by mouth at bedtime.    [provider]  Venlafaxine HCl 225 MG TB24 Take 225 mg by mouth daily with breakfast.     [provider]  vitamin B-12 (CYANOCOBALAMIN) 500 MCG tablet Take 500 mcg by mouth daily. AM    [provider]    Allergies Alendronate and Penicillins  Family History  Problem  Relation Age of Onset  . Dementia Father   . Breast cancer Sister 70    Social History Social History   Tobacco Use  . Smoking status: Current Every Day Smoker    Packs/day: 1.00    Years: 55.00    Pack years: 55.00  . Smokeless tobacco: Never Used  Substance Use Topics  . Alcohol use: No  . Drug use: No    Review of Systems  Constitutional: Positive for fever Eyes: No visual changes. ENT: No sore throat. Cardiovascular: Denies chest pain. Respiratory: Denies shortness of breath. Gastrointestinal: No abdominal pain.  No nausea, no vomiting.  No diarrhea.  No constipation. Genitourinary: Negative for dysuria. Musculoskeletal: Negative for back pain.  Skin: Positive for tender, erythematous area to the right forearm. Neurological: Negative for headaches, focal weakness or numbness. ____________________________________________   PHYSICAL EXAM:  VITAL SIGNS: ED Triage Vitals  Enc Vitals Group     BP 12/14/19 1103 (!) 142/67     Pulse Rate 12/14/19 1103 88     Resp 12/14/19 1103 20     Temp 12/14/19 1103 98.9 F (37.2 C)     Temp Source 12/14/19 1103 Oral     SpO2 12/14/19 1103 95 %     Weight 12/14/19 1104 134 lb 8 oz (61 kg)     Height 12/14/19 1104 5\' 5"  (1.651 m)     Head Circumference --      Peak Flow --      Pain Score 12/14/19 1103 6     Pain Loc --      Pain Edu? --      Excl. in [place]? --     Constitutional: Alert and oriented. Well appearing and in no acute distress. Eyes: Conjunctivae are normal. Head: Atraumatic. Nose: No congestion/rhinnorhea. Mouth/Throat: Mucous membranes are moist.  Oropharynx non-erythematous. Neck: No stridor.   Cardiovascular: Normal rate, regular rhythm. Grossly normal heart sounds.  Good peripheral circulation. Respiratory: Normal respiratory effort.  No retractions. Lungs CTAB. Gastrointestinal: Soft and nontender. No distention. No abdominal bruits. No CVA tenderness. Genitourinary:  Musculoskeletal: No lower extremity  tenderness nor edema.  Neurologic:  Normal speech and language.  Skin: Erythematous, tender, taut skin noted to the dorsal aspect of the right forearm above the wrist.  No lymphangitis.  Appearance of ruptured bullae in the lateral aspect of the erythematous area.  No drainage.   Psychiatric: Mood and affect are normal. Speech and behavior are normal.  ____________________________________________   LABS (all labs ordered are listed, but only abnormal results are displayed)  Labs Reviewed  COMPREHENSIVE METABOLIC PANEL - Abnormal; Notable for the following components:      Result Value   CO2 20 (*)    Glucose, Bld 218 (*)    Creatinine, Ser 1.42 (*)    GFR calc non Af Amer 37 (*)    GFR calc Af Amer 42 (*)    All other components within normal limits  CBC WITH DIFFERENTIAL/PLATELET - Abnormal; Notable for the following components:   RBC 3.74 (*)    Hemoglobin 11.8 (*)    HCT 35.8 (*)    All other components within normal limits  URINALYSIS, COMPLETE (UACMP) WITH MICROSCOPIC - Abnormal; Notable for the following components:   Color, Urine STRAW (*)    APPearance CLEAR (*)    All other components within normal limits  CULTURE, BLOOD (ROUTINE X 2)  CULTURE, BLOOD (ROUTINE X 2)  LACTIC ACID, PLASMA   ____________________________________________  EKG  Not indicated ____________________________________________  RADIOLOGY  ED MD interpretation:    Not indicated I, Tonianne Fine, personally viewed and evaluated these images (plain radiographs) as part of my medical decision making, as well as reviewing the written report by the radiologist.  Official radiology report(s): No results found.  ____________________________________________   PROCEDURES  Procedure(s) performed (including Critical Care):  Procedures  ____________________________________________   INITIAL IMPRESSION / ASSESSMENT AND PLAN     74 year old female presenting to the emergency department for  treatment and evaluation of erythematous, tender area to the right forearm that has been present for the past couple days.  She is also experienced a couple episodes of diarrhea and has had a fever.  See HPI for  further details.  Plan will be to review the labs and administer IV antibiotics.  Blood cultures also to be collected.  DIFFERENTIAL DIAGNOSIS  Cellulitis, lymphangitis, thrombophlebitis, DVT  ED COURSE  Lab studies are overall reassuring.  White blood cell count is normal with no indication of a left shift.  Creatinine is elevated at 1.4 which is near her baseline.  Rocephin administered IV while here in the emergency department.  She remained afebrile.  She will be discharged home with a prescription for Bactrim and tramadol.  She was encouraged to see her primary care provider in 2 days if not improving.  For any symptom that change or worsens if she is unable to see primary care she is to return to the emergency department.  She is to also continue taking her Tylenol for pain or fever.  She was advised to use the tramadol very sparingly and mainly at night. ____________________________________________   FINAL CLINICAL IMPRESSION(S) / ED DIAGNOSES  Final diagnoses:  Cellulitis of right upper extremity     ED Discharge Orders         Ordered    sulfamethoxazole-trimethoprim (BACTRIM DS) 800-160 MG tablet  2 times daily     12/14/19 1356    traMADol (ULTRAM) 50 MG tablet  Every 12 hours PRN     12/14/19 1359           Mikaila Diogo was evaluated in Emergency Department on 12/14/2019 for the symptoms described in the history of present illness. She was evaluated in the context of the global COVID-19 pandemic, which necessitated consideration that the patient might be at risk for infection with the SARS-CoV-2 virus that causes COVID-19. Institutional protocols and algorithms that pertain to the evaluation of patients at risk for COVID-19 are in a state of rapid change based on  information released by regulatory bodies including the CDC and federal and state organizations. These policies and algorithms were followed during the patient's care in the ED.   Note:  This document was prepared using Dragon voice recognition software and may include unintentional dictation errors.   Victorino Dike, FNP 12/14/19 2030    Arta Silence, MD 12/18/19 [phone]

## 2019-12-15 ENCOUNTER — Telehealth: Payer: Self-pay | Admitting: Internal Medicine

## 2019-12-15 NOTE — Telephone Encounter (Signed)
Patient daughter calling  States that they have scheduled her Cardiac MRI on March the 8th but her current appointment for the loop recorder removal is March 23rd Would like to know if she can continue with the MRI or does something need to be rescheduled Please call to discuss

## 2019-12-15 NOTE — Telephone Encounter (Signed)
As long as her ILR is not in the way of imaging for her cardiac MRI, it can be done prior.

## 2019-12-15 NOTE — Telephone Encounter (Signed)
Spoke with patient regardng the appointment for her Cardiac MRI on 01/12/20 at [facility]---pt states she has a Loop recorder that is to be removed prior to the MRI.  Please contact patient to schedule.

## 2019-12-16 NOTE — Telephone Encounter (Signed)
Received Verbal release from patient to discuss with Daughter Roselyn Reef .    Patient will be here for 2/23 appt and will have a visitor with her as she had a cva and cannot remember .   Patient wants to add daughter to [facility] when here.

## 2019-12-16 NOTE — Telephone Encounter (Signed)
I attempted to call the patient. Per Dr. [name]- ok to add on 1st patient of the morning for a LINQ removal- would need to be on a Tuesday.  No answer- I left a message for the patient to call back.  Would like to see if she could come on Tuesday 12/30/19 at 8:00 am (40 min slot so we don't have an 8:20 am patient) (this should be removed prior to her Cardiac MRI).

## 2019-12-17 DIAGNOSIS — L03113 Cellulitis of right upper limb: Secondary | ICD-10-CM | POA: Diagnosis not present

## 2019-12-19 LAB — CULTURE, BLOOD (ROUTINE X 2)
Culture: NO GROWTH
Culture: NO GROWTH

## 2019-12-22 ENCOUNTER — Ambulatory Visit (INDEPENDENT_AMBULATORY_CARE_PROVIDER_SITE_OTHER): Payer: Medicare HMO | Admitting: *Deleted

## 2019-12-22 DIAGNOSIS — E039 Hypothyroidism, unspecified: Secondary | ICD-10-CM | POA: Diagnosis not present

## 2019-12-22 DIAGNOSIS — R202 Paresthesia of skin: Secondary | ICD-10-CM | POA: Diagnosis not present

## 2019-12-22 DIAGNOSIS — I639 Cerebral infarction, unspecified: Secondary | ICD-10-CM

## 2019-12-22 DIAGNOSIS — N183 Chronic kidney disease, stage 3 unspecified: Secondary | ICD-10-CM | POA: Diagnosis not present

## 2019-12-22 DIAGNOSIS — I129 Hypertensive chronic kidney disease with stage 1 through stage 4 chronic kidney disease, or unspecified chronic kidney disease: Secondary | ICD-10-CM | POA: Diagnosis not present

## 2019-12-22 DIAGNOSIS — I69398 Other sequelae of cerebral infarction: Secondary | ICD-10-CM | POA: Diagnosis not present

## 2019-12-22 DIAGNOSIS — D631 Anemia in chronic kidney disease: Secondary | ICD-10-CM | POA: Diagnosis not present

## 2019-12-22 DIAGNOSIS — F418 Other specified anxiety disorders: Secondary | ICD-10-CM | POA: Diagnosis not present

## 2019-12-22 DIAGNOSIS — H539 Unspecified visual disturbance: Secondary | ICD-10-CM | POA: Diagnosis not present

## 2019-12-22 DIAGNOSIS — E1122 Type 2 diabetes mellitus with diabetic chronic kidney disease: Secondary | ICD-10-CM | POA: Diagnosis not present

## 2019-12-22 LAB — CUP PACEART REMOTE DEVICE CHECK
Date Time Interrogation Session: 20210215085137
Implantable Pulse Generator Implant Date: 20170530

## 2019-12-23 ENCOUNTER — Telehealth: Payer: Self-pay | Admitting: Internal Medicine

## 2019-12-23 NOTE — Telephone Encounter (Signed)
Cardiac MRI ordered by Thurmond Butts, [place].  She is scheduled to come in on 2/23 to have her LINQ removed.   Not sure of the reason she wants to cancel the MRI.

## 2019-12-23 NOTE — Telephone Encounter (Signed)
Patient preference to cancel cardiac MRI. This can be reassessed at her visit on 12/30/2019 with her primary cardiologist.

## 2019-12-23 NOTE — Telephone Encounter (Signed)
Patient daughter states pt would like to cancel her MRI.

## 2019-12-23 NOTE — Progress Notes (Signed)
ILR Remote 

## 2019-12-26 DIAGNOSIS — H539 Unspecified visual disturbance: Secondary | ICD-10-CM | POA: Diagnosis not present

## 2019-12-26 DIAGNOSIS — I129 Hypertensive chronic kidney disease with stage 1 through stage 4 chronic kidney disease, or unspecified chronic kidney disease: Secondary | ICD-10-CM | POA: Diagnosis not present

## 2019-12-26 DIAGNOSIS — R202 Paresthesia of skin: Secondary | ICD-10-CM | POA: Diagnosis not present

## 2019-12-26 DIAGNOSIS — D631 Anemia in chronic kidney disease: Secondary | ICD-10-CM | POA: Diagnosis not present

## 2019-12-26 DIAGNOSIS — E1122 Type 2 diabetes mellitus with diabetic chronic kidney disease: Secondary | ICD-10-CM | POA: Diagnosis not present

## 2019-12-26 DIAGNOSIS — N183 Chronic kidney disease, stage 3 unspecified: Secondary | ICD-10-CM | POA: Diagnosis not present

## 2019-12-26 DIAGNOSIS — E039 Hypothyroidism, unspecified: Secondary | ICD-10-CM | POA: Diagnosis not present

## 2019-12-26 DIAGNOSIS — F418 Other specified anxiety disorders: Secondary | ICD-10-CM | POA: Diagnosis not present

## 2019-12-26 DIAGNOSIS — I69398 Other sequelae of cerebral infarction: Secondary | ICD-10-CM | POA: Diagnosis not present

## 2019-12-29 ENCOUNTER — Inpatient Hospital Stay: Payer: Commercial Managed Care - HMO | Admitting: Neurology

## 2019-12-30 ENCOUNTER — Ambulatory Visit (INDEPENDENT_AMBULATORY_CARE_PROVIDER_SITE_OTHER): Payer: Medicare HMO | Admitting: Internal Medicine

## 2019-12-30 ENCOUNTER — Other Ambulatory Visit: Payer: Self-pay

## 2019-12-30 ENCOUNTER — Encounter: Payer: Self-pay | Admitting: Internal Medicine

## 2019-12-30 ENCOUNTER — Other Ambulatory Visit (HOSPITAL_COMMUNITY): Payer: Self-pay

## 2019-12-30 VITALS — BP 146/60 | HR 43 | Ht 65.0 in | Wt 133.0 lb

## 2019-12-30 DIAGNOSIS — I480 Paroxysmal atrial fibrillation: Secondary | ICD-10-CM

## 2019-12-30 DIAGNOSIS — E039 Hypothyroidism, unspecified: Secondary | ICD-10-CM | POA: Diagnosis not present

## 2019-12-30 DIAGNOSIS — R001 Bradycardia, unspecified: Secondary | ICD-10-CM

## 2019-12-30 DIAGNOSIS — I69398 Other sequelae of cerebral infarction: Secondary | ICD-10-CM | POA: Diagnosis not present

## 2019-12-30 DIAGNOSIS — I639 Cerebral infarction, unspecified: Secondary | ICD-10-CM | POA: Diagnosis not present

## 2019-12-30 DIAGNOSIS — I129 Hypertensive chronic kidney disease with stage 1 through stage 4 chronic kidney disease, or unspecified chronic kidney disease: Secondary | ICD-10-CM | POA: Diagnosis not present

## 2019-12-30 DIAGNOSIS — H539 Unspecified visual disturbance: Secondary | ICD-10-CM | POA: Diagnosis not present

## 2019-12-30 DIAGNOSIS — Z4589 Encounter for adjustment and management of other implanted devices: Secondary | ICD-10-CM | POA: Diagnosis not present

## 2019-12-30 DIAGNOSIS — E1122 Type 2 diabetes mellitus with diabetic chronic kidney disease: Secondary | ICD-10-CM | POA: Diagnosis not present

## 2019-12-30 DIAGNOSIS — Z959 Presence of cardiac and vascular implant and graft, unspecified: Secondary | ICD-10-CM | POA: Diagnosis not present

## 2019-12-30 DIAGNOSIS — I10 Essential (primary) hypertension: Secondary | ICD-10-CM | POA: Diagnosis not present

## 2019-12-30 DIAGNOSIS — D631 Anemia in chronic kidney disease: Secondary | ICD-10-CM | POA: Diagnosis not present

## 2019-12-30 DIAGNOSIS — F418 Other specified anxiety disorders: Secondary | ICD-10-CM | POA: Diagnosis not present

## 2019-12-30 DIAGNOSIS — N183 Chronic kidney disease, stage 3 unspecified: Secondary | ICD-10-CM | POA: Diagnosis not present

## 2019-12-30 DIAGNOSIS — R202 Paresthesia of skin: Secondary | ICD-10-CM | POA: Diagnosis not present

## 2019-12-30 MED ORDER — METOPROLOL SUCCINATE ER 50 MG PO TB24: 50.0000 mg | ORAL_TABLET | Freq: Every day | ORAL | 6 refills | Status: DC

## 2019-12-30 NOTE — Patient Instructions (Signed)
Medication Instructions:  - Your physician has recommended you make the following change in your medication:   1) Decrease metoprolol succinate to 50 mg- take 1 tablet by mouth once daily  - you may cut the metoprolol 100 mg tablets in 1/2 until you use them up  *If you need a refill on your cardiac medications before your next appointment, please call your pharmacy*  Lab Work: - none ordered  If you have labs (blood work) drawn today and your tests are completely normal, you will receive your results only by: Marland Kitchen MyChart Message (if you have MyChart) OR . A paper copy in the mail If you have any lab test that is abnormal or we need to change your treatment, we will call you to review the results.  Testing/Procedures: - none ordered  Follow-Up: At [facility], you and your health needs are our priority.  As part of our continuing mission to provide you with exceptional heart care, we have created designated Provider Care Teams.  These Care Teams include your primary Cardiologist (physician) and Advanced Practice Providers (APPs -  Physician Assistants and Nurse Practitioners) who all work together to provide you with the care you need, when you need it.  Your next appointment:   As scheduled   The format for your next appointment:   In Person  Provider:   Virl Axe, MD  Other Instructions - You may shower on Thursday (2/25) - You may take off your tegaderm dressing on Sunday (2/28) - Steri strips should stay on at least 7-10 days, if after 10 days, they have not fallen off on their own, you may remove them.   - Dr. Olin Pia nurse, Nira Conn, will call you back next week to follow up on how your heart rates are doing on the lower dose metoprolol.

## 2019-12-30 NOTE — Progress Notes (Signed)
Patient Care Team: Adin Hector, MD as PCP - General (Internal Medicine) Deboraha Sprang, MD as PCP - Cardiology (Cardiology) Deboraha Sprang, MD as PCP - Electrophysiology (Cardiology)   HPI  Erin Yates is a 74 y.o. female Seen in the past by Dr. Carlyn Reichert because of palpitations in the context of prior closure of the PFO and cryptogenic stroke.  TEE 4/17 was normal with Amplatzer closure device in good position    Device >>SCAF >> .apixoban issue of compliance related to cost.  She has had now 7 strokes. She has lost vision with one of the more recent strokes   She had bradycardia Dr. Caryn Section decreased her metoprolol from 200--100-now with HR 43  Date Cr K Hgb  5/19.  1.1 5.2   1/20 1/4 4.8         Recent echo >> small calcified opacity on the mitral valve posterior leafletfor which MRI is recommended   She tells me that calcification of the mitral valve has been noted for years.  She used to be followed at [facility] clinic but no longer.  Not sure she wants to undergo an MRI.  Wants to have her loop recorder removed.  Dyspnea on exertion.  Fatigue.  No edema or chest pain.  Past Medical History:  Diagnosis Date  . Anemia    past  hx  . Anxiety   . Depression   . Diabetes mellitus without complication ([place])   . Dysrhythmia    prior to PFO correction  . GERD (gastroesophageal reflux disease)   . Headache    before PFO closure  . Hypercholesteremia   . Hypertension   . Hypothyroidism   . 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 PFO (patent foramen ovale)    correction device placed 2010  . Right leg numbness    lower leg- residual from CVA  . Seasonal allergies   . Stroke [facility])    prior to PFO correction    Past Surgical History:  Procedure Laterality Date  . ABDOMINAL HYSTERECTOMY    . CARDIAC CATHETERIZATION     [facility]  . CATARACT EXTRACTION W/PHACO Right 09/01/2015   Procedure: CATARACT EXTRACTION PHACO AND INTRAOCULAR LENS PLACEMENT  (IOC);  Surgeon: Leandrew Koyanagi, MD;  Location: [place];  Service: Ophthalmology;  Laterality: Right;  DIABETIC - oral meds  . CATARACT EXTRACTION W/PHACO Left 10/20/2015   Procedure: CATARACT EXTRACTION PHACO AND INTRAOCULAR LENS PLACEMENT (IOC);  Surgeon: Leandrew Koyanagi, MD;  Location: [place];  Service: Ophthalmology;  Laterality: Left;  DIABETIC - oral meds  . EP IMPLANTABLE DEVICE N/A 04/04/2016   Procedure: Loop Recorder Insertion;  Surgeon: Deboraha Sprang, MD;  Location: [place] CV LAB;  Service: Cardiovascular;  Laterality: N/A;  . FOOT NEUROMA SURGERY Left   . NASAL SINUS SURGERY    . PATENT FORAMEN OVALE CLOSURE    . PATENT FORAMEN OVALE CLOSURE    . TEE WITHOUT CARDIOVERSION N/A 02/09/2016   Procedure: TRANSESOPHAGEAL ECHOCARDIOGRAM (TEE);  Surgeon: Adrian Prows, MD;  Location: [place];  Service: Cardiovascular;  Laterality: N/A;  . TEE WITHOUT CARDIOVERSION N/A 11/12/2019   Procedure: TRANSESOPHAGEAL ECHOCARDIOGRAM (TEE);  Surgeon: Minna Merritts, MD;  Location: [facility];  Service: Cardiovascular;  Laterality: N/A;    Current Outpatient Medications  Medication Sig Dispense Refill  . ALPRAZolam (XANAX) 0.25 MG tablet Take 0.25 mg by mouth 2 (two) times daily as needed  for anxiety.    Marland Kitchen amLODipine (NORVASC) 10 MG tablet Take 10 mg by mouth daily. PM    . apixaban (ELIQUIS) 5 MG TABS tablet Take 1 tablet (5 mg total) by mouth 2 (two) times daily. 60 tablet 3  . atorvastatin (LIPITOR) 80 MG tablet Take 80 mg by mouth daily.    Marland Kitchen buPROPion (WELLBUTRIN SR) 150 MG 12 hr tablet Take 1 tablet (150 mg total) by mouth 2 (two) times daily. 60 tablet 5  . cholecalciferol (VITAMIN D3) 25 MCG (1000 UT) tablet Take 1,000 Units by mouth daily.    . fluticasone (FLONASE) 50 MCG/ACT nasal spray Place into both nostrils daily as needed for allergies or rhinitis.    . hydrochlorothiazide (MICROZIDE) 12.5 MG capsule Take 12.5 mg by mouth daily.    Marland Kitchen  levothyroxine (SYNTHROID, LEVOTHROID) 50 MCG tablet Take 50 mcg by mouth daily before breakfast.    . losartan (COZAAR) 100 MG tablet Take 100 mg by mouth daily. AM    . metFORMIN (GLUCOPHAGE) 1000 MG tablet Take 1,000 mg by mouth 2 (two) times daily with a meal.    . metoprolol succinate (TOPROL-XL) 100 MG 24 hr tablet Take 1 tablet (100 mg total) by mouth daily. 90 tablet 3  . pantoprazole (PROTONIX) 40 MG tablet Take 40 mg by mouth 2 (two) times daily. Reported on 04/17/2016    . sulfamethoxazole-trimethoprim (BACTRIM DS) 800-160 MG tablet Take 1 tablet by mouth 2 (two) times daily. 20 tablet 0  . traMADol (ULTRAM) 50 MG tablet Take 1 tablet (50 mg total) by mouth every 12 (twelve) hours as needed. 9 tablet 0  . traZODone (DESYREL) 50 MG tablet Take 50 mg by mouth at bedtime.    . Venlafaxine HCl 225 MG TB24 Take 225 mg by mouth daily with breakfast.     . vitamin B-12 (CYANOCOBALAMIN) 500 MCG tablet Take 500 mcg by mouth daily. AM    . nicotine (NICODERM CQ - DOSED IN MG/24 HOURS) 21 mg/24hr patch Place 1 patch (21 mg total) onto the skin daily. (Patient not taking: Reported on 12/04/2019) 28 patch 0   No current facility-administered medications for this visit.    Allergies  Allergen Reactions  . Alendronate Other (See Comments)  . Penicillins Rash    Also swelling and itching      Review of Systems negative except from HPI and PMH  Physical Exam BP (!) 146/60 (BP Location: Left Arm, Patient Position: Sitting, Cuff Size: Normal)   Pulse (!) 43   Ht 5\' 5"  (1.651 m)   Wt 133 lb (60.3 kg)   SpO2 98%   BMI 22.13 kg/m  Well developed and well nourished in no acute distress HENT normal Neck supple with JVP-flat Clear Device pocket well healed; without hematoma or erythema.  There is no tethering  Regular rate and rhythm, n murmur Abd-soft with active BS No Clubbing cyanosis  edema Skin-warm and dry A & Oriented  Grossly normal sensory and motor function  ECG sinus at  43 Intervals 20/08/48  Assessment and  Plan\ Cryptogenic stroke  Hypertension  Subclinical atrial fibrillation  Sinus bradycardia  PFO-Amplatzer closure  Mitral valve lesion-calcified  Implanted loop recorder now at EOL   The patient has a mitral valve lesion that has prompted consideration of further evaluation.  We will need to review her images serially over the last couple of years to see whether it is might be an explanation as a cause of her recurrent strokes  No  symptomatic atrial fibrillation  No bleeding  Would like to have her loop recorder removed  Athziry Wynne [id number]  [id number]  Preop Dx: Implantable loop recorder at EOS Postop Dx same/   Procedure: After routine prep and drape of the skin, lidocaine was infiltrated at the proximal end of the palpated loop recorder was carried down to the layer device.  The device was extracted.  A benzoin Steri-Strip dressing was applied.  Cx: None       Virl Axe, MD 12/30/2019 1:03 PM

## 2019-12-31 DIAGNOSIS — E1122 Type 2 diabetes mellitus with diabetic chronic kidney disease: Secondary | ICD-10-CM | POA: Diagnosis not present

## 2019-12-31 DIAGNOSIS — E034 Atrophy of thyroid (acquired): Secondary | ICD-10-CM | POA: Diagnosis not present

## 2019-12-31 DIAGNOSIS — I129 Hypertensive chronic kidney disease with stage 1 through stage 4 chronic kidney disease, or unspecified chronic kidney disease: Secondary | ICD-10-CM | POA: Diagnosis not present

## 2019-12-31 DIAGNOSIS — K219 Gastro-esophageal reflux disease without esophagitis: Secondary | ICD-10-CM | POA: Diagnosis not present

## 2019-12-31 DIAGNOSIS — F3342 Major depressive disorder, recurrent, in full remission: Secondary | ICD-10-CM | POA: Diagnosis not present

## 2019-12-31 DIAGNOSIS — E1151 Type 2 diabetes mellitus with diabetic peripheral angiopathy without gangrene: Secondary | ICD-10-CM | POA: Diagnosis not present

## 2019-12-31 DIAGNOSIS — D692 Other nonthrombocytopenic purpura: Secondary | ICD-10-CM | POA: Diagnosis not present

## 2020-01-08 ENCOUNTER — Telehealth: Payer: Self-pay | Admitting: *Deleted

## 2020-01-08 DIAGNOSIS — I059 Rheumatic mitral valve disease, unspecified: Secondary | ICD-10-CM

## 2020-01-08 DIAGNOSIS — Z01812 Encounter for preprocedural laboratory examination: Secondary | ICD-10-CM

## 2020-01-08 NOTE — Telephone Encounter (Signed)
I spoke with Dr. [name] regarding if the patient needs to keep her appointment for a Cardiac MRI.  Per Dr. [name], he has reviewed this with Dr. Stanford Breed & Dr. Sallyanne Kuster. Per Dr. [name], the patient does not need a Cardiac MRI, but she will need a TEE in [facility] with either Dr. Stanford Breed Dr. Sallyanne Kuster to evaluate her mitral valve further.

## 2020-01-08 NOTE — Telephone Encounter (Signed)
-----   Message from Erin Filbert, RN sent at 12/30/2019 11:29 AM EST ----- Call the patient: 1) how are her HRs on lower dose metoprolol 2) does she need her Cardiac MRI? 3) f/u with SK in 1 month?  4) how is her explant site

## 2020-01-09 NOTE — Telephone Encounter (Signed)
I spoke with the patient.  I advised her that Dr. [name] has spoken with 2 of his partners about her mitral valve and whether she needed the Cardiac MRI or not. The patient is aware, per Dr. [name], she will need to have TEE done as the more appropriate test to evaluate her mitral valve. The patient states she has had this done before and will talk this over with her husband.   I also inquired from the patient: 1) how are her HR's on lower dose metoprolol - per the patient, the first time she checked it was this morning as she just received her pulse oximeter, HR - 52 bpm  2) how was her explant site for her LINQ. - she states this looks good and she has not concerns about it  The patient is aware I will cancel her Cardiac MRI for Monday. She is aware I will review her HR's with Dr. [name] and call her back early next week to review if any further reduction in metoprolol is needed and to see if she has decided about her TEE.   She is also aware I am still going to leave her appt as scheduled with Dr. [name] on 3/23 until we speak next week and decide about her TEE/ HR followup.  The patient voices understanding of the above and is agreeable.

## 2020-01-09 NOTE — Telephone Encounter (Signed)
Patient calling back to find out what she needs to for upcoming appointment. Patient will be called at 2:30 by a [facility] office to confirm her appointment and would like to be called prior to then  Please advise

## 2020-01-12 ENCOUNTER — Ambulatory Visit (HOSPITAL_COMMUNITY): Admission: RE | Admit: 2020-01-12 | Payer: Medicare HMO | Source: Ambulatory Visit

## 2020-01-15 NOTE — Telephone Encounter (Addendum)
I spoke with the patient and advised her of Dr. Olin Pia recommendations to decrease metoprolol succinate to 25 mg once daily. Per the patient, she was already starting to feel a little better on the metoprolol succinate 50 mg once daily- HR's ~ 58-63 bpm. She currently does have some metoprolol succinate 50 mg tablets at home and will cut these in 1/2 and take 1/2 tablet once daily.  I have asked her about her decision regarding another TEE. The patient states "I have already had several, what will this show." I advised I did not realize her latest TEE was in January 2021. She is aware the concern is still a lesion on her MV that could be a source of her strokes, but I will review further with Dr. [name] for clarification and either he or I will call her back next week.  The patient voices understanding and is agreeable.

## 2020-01-15 NOTE — Telephone Encounter (Signed)
I verbally spoke with Dr. [name] about the information below. Per Dr. [name] ok to decrease metoprolol succinate to 25 mg once daily for bradycardia.   I attempted to call the patient today to follow up on her decision regarding having a TEE done and Dr. Olin Pia recommendations to decrease metoprolol. No answer- I left a message to please call back.

## 2020-01-20 ENCOUNTER — Encounter: Payer: Self-pay | Admitting: *Deleted

## 2020-01-20 NOTE — Telephone Encounter (Signed)
I spoke with the patient regarding Dr. Olin Pia reasoning for an additional TEE to be done at [facility]. The patient voices understanding and is agreeable. She is aware I have scheduled her as below. I have review pre-procedure instructions with both the patient and her husband over the phone. They patient's husband voices understanding. He is aware I will also mail a copy of these instructions as well. Mailing address confirmed.  I have advised I am cancelling the appointment with Dr. [name] on 3/23 due to the fact that her TEE is not until 4/2. The patient's husband is aware we will call back to reschedule her follow up appointment for after her TEE.   You are scheduled for a TEE on Friday 02/06/20 with Dr. Sallyanne Kuster.  Please arrive at the [facility] (Main Entrance A) at [facility]: [address] [place], [place] [id number] at 8:00 am. (1 hour prior to procedure unless lab work is needed; if lab work is needed arrive 1.5 hours ahead)  DIET: Nothing to eat or drink after midnight except a sip of water with medications (see medication instructions below)  Medication Instructions: Hold Metformin the morning of your procedure  Continue your anticoagulant: Eliquis You will need to continue your anticoagulant after your procedure until you  are told by your  Provider that it is safe to stop   Labs: If patient is on Coumadin, patient needs pt/INR, CBC, BMET within 3 days (No pt/INR needed for patients taking Xarelto, Eliquis, Pradaxa) For patients receiving anesthesia for TEE and all Cardioversion patients: BMET, CBC within 1 week  Pre procedure labs: Wednesday 02/04/20 (12:00 pm- 2:00 pm) - [facility] at [facility], 1st desk on the right to check in (past the screening table)   Pre procedure COVID swab: Wednesday 02/04/20 (12:30 pm- 2:30 pm)  - [facility] entrance, drive up testing only    You must have a responsible person to drive you home and stay in the waiting area during your procedure.  Failure to do so could result in cancellation.  Bring your insurance cards.  *Special Note: Every effort is made to have your procedure done on time. Occasionally there are emergencies that occur at the hospital that may cause delays. Please be patient if a delay does occur.

## 2020-01-20 NOTE — Telephone Encounter (Signed)
I spoke with Dr. [name] further about the patient's TEE that has been recommended. Per Dr. [name], they will specifically looking at her mitral valve and with the equipment/ technology being a little better at [facility], they hope to be able to see her mitral valve much better.   Attempted to call the patient.  No answer- I left a message to please call back.

## 2020-01-27 ENCOUNTER — Encounter: Payer: Medicare HMO | Admitting: Internal Medicine

## 2020-01-29 ENCOUNTER — Other Ambulatory Visit: Payer: Self-pay | Admitting: Internal Medicine

## 2020-01-29 DIAGNOSIS — I059 Rheumatic mitral valve disease, unspecified: Secondary | ICD-10-CM

## 2020-02-04 ENCOUNTER — Other Ambulatory Visit: Payer: Self-pay

## 2020-02-04 ENCOUNTER — Other Ambulatory Visit
Admission: RE | Admit: 2020-02-04 | Discharge: 2020-02-04 | Disposition: A | Discharge status: Home / Self Care | Payer: Medicare HMO | Source: Ambulatory Visit | Attending: Cardiovascular Disease | Admitting: Cardiovascular Disease

## 2020-02-04 ENCOUNTER — Other Ambulatory Visit
Admission: RE | Admit: 2020-02-04 | Discharge: 2020-02-04 | Disposition: A | Discharge status: Home / Self Care | Payer: Medicare HMO | Source: Ambulatory Visit | Attending: Internal Medicine | Admitting: Internal Medicine

## 2020-02-04 DIAGNOSIS — Z01812 Encounter for preprocedural laboratory examination: Secondary | ICD-10-CM | POA: Diagnosis not present

## 2020-02-04 DIAGNOSIS — Z20822 Contact with and (suspected) exposure to covid-19: Secondary | ICD-10-CM | POA: Diagnosis not present

## 2020-02-04 DIAGNOSIS — I059 Rheumatic mitral valve disease, unspecified: Secondary | ICD-10-CM

## 2020-02-04 LAB — CBC WITH DIFFERENTIAL/PLATELET
Abs Immature Granulocytes: 0.02 10*3/uL (ref 0.00–0.07)
Basophils Absolute: 0 10*3/uL (ref 0.0–0.1)
Basophils Relative: 1 %
Eosinophils Absolute: 0.1 10*3/uL (ref 0.0–0.5)
Eosinophils Relative: 2 %
HCT: 34.6 % — ABNORMAL LOW (ref 36.0–46.0)
Hemoglobin: 11.3 g/dL — ABNORMAL LOW (ref 12.0–15.0)
Immature Granulocytes: 0 %
Lymphocytes Relative: 35 %
Lymphs Abs: 2.2 10*3/uL (ref 0.7–4.0)
MCH: 31.6 pg (ref 26.0–34.0)
MCHC: 32.7 g/dL (ref 30.0–36.0)
MCV: 96.6 fL (ref 80.0–100.0)
Monocytes Absolute: 0.4 10*3/uL (ref 0.1–1.0)
Monocytes Relative: 7 %
Neutro Abs: 3.5 10*3/uL (ref 1.7–7.7)
Neutrophils Relative %: 55 %
Platelets: 244 10*3/uL (ref 150–400)
RBC: 3.58 MIL/uL — ABNORMAL LOW (ref 3.87–5.11)
RDW: 13.5 % (ref 11.5–15.5)
WBC: 6.3 10*3/uL (ref 4.0–10.5)
nRBC: 0 % (ref 0.0–0.2)

## 2020-02-04 LAB — BASIC METABOLIC PANEL
Anion gap: 9 (ref 5–15)
BUN: 19 mg/dL (ref 8–23)
CO2: 21 mmol/L — ABNORMAL LOW (ref 22–32)
Calcium: 9.4 mg/dL (ref 8.9–10.3)
Chloride: 109 mmol/L (ref 98–111)
Creatinine, Ser: 1.59 mg/dL — ABNORMAL HIGH (ref 0.44–1.00)
GFR calc Af Amer: 37 mL/min — ABNORMAL LOW (ref >60)
GFR calc non Af Amer: 32 mL/min — ABNORMAL LOW (ref >60)
Glucose, Bld: 177 mg/dL — ABNORMAL HIGH (ref 70–99)
Potassium: 4.6 mmol/L (ref 3.5–5.1)
Sodium: 139 mmol/L (ref 135–145)

## 2020-02-04 LAB — SARS CORONAVIRUS 2 (TAT 6-24 HRS): SARS Coronavirus 2: NEGATIVE

## 2020-02-06 ENCOUNTER — Other Ambulatory Visit: Payer: Self-pay

## 2020-02-06 ENCOUNTER — Encounter (HOSPITAL_COMMUNITY)
Admission: RE | Disposition: A | Discharge status: Home / Self Care | Payer: Medicare HMO | Source: Home / Self Care | Attending: Cardiovascular Disease

## 2020-02-06 ENCOUNTER — Ambulatory Visit (HOSPITAL_COMMUNITY): Payer: Medicare HMO | Admitting: Anesthesiology

## 2020-02-06 ENCOUNTER — Ambulatory Visit (HOSPITAL_COMMUNITY)
Admission: RE | Admit: 2020-02-06 | Discharge: 2020-02-06 | Disposition: A | Discharge status: Home / Self Care | Payer: Medicare HMO | Attending: Cardiovascular Disease | Admitting: Cardiovascular Disease

## 2020-02-06 ENCOUNTER — Ambulatory Visit (HOSPITAL_BASED_OUTPATIENT_CLINIC_OR_DEPARTMENT_OTHER): Payer: Medicare HMO

## 2020-02-06 ENCOUNTER — Encounter (HOSPITAL_COMMUNITY): Payer: Self-pay | Admitting: Cardiovascular Disease

## 2020-02-06 DIAGNOSIS — F17210 Nicotine dependence, cigarettes, uncomplicated: Secondary | ICD-10-CM | POA: Insufficient documentation

## 2020-02-06 DIAGNOSIS — K219 Gastro-esophageal reflux disease without esophagitis: Secondary | ICD-10-CM | POA: Diagnosis not present

## 2020-02-06 DIAGNOSIS — E039 Hypothyroidism, unspecified: Secondary | ICD-10-CM | POA: Diagnosis not present

## 2020-02-06 DIAGNOSIS — I341 Nonrheumatic mitral (valve) prolapse: Secondary | ICD-10-CM | POA: Diagnosis not present

## 2020-02-06 DIAGNOSIS — E7800 Pure hypercholesterolemia, unspecified: Secondary | ICD-10-CM | POA: Insufficient documentation

## 2020-02-06 DIAGNOSIS — Z7901 Long term (current) use of anticoagulants: Secondary | ICD-10-CM | POA: Diagnosis not present

## 2020-02-06 DIAGNOSIS — Z7984 Long term (current) use of oral hypoglycemic drugs: Secondary | ICD-10-CM | POA: Diagnosis not present

## 2020-02-06 DIAGNOSIS — I639 Cerebral infarction, unspecified: Secondary | ICD-10-CM | POA: Insufficient documentation

## 2020-02-06 DIAGNOSIS — E119 Type 2 diabetes mellitus without complications: Secondary | ICD-10-CM | POA: Insufficient documentation

## 2020-02-06 DIAGNOSIS — F329 Major depressive disorder, single episode, unspecified: Secondary | ICD-10-CM | POA: Diagnosis not present

## 2020-02-06 DIAGNOSIS — I6310 Cerebral infarction due to embolism of unspecified precerebral artery: Secondary | ICD-10-CM

## 2020-02-06 DIAGNOSIS — I340 Nonrheumatic mitral (valve) insufficiency: Secondary | ICD-10-CM

## 2020-02-06 DIAGNOSIS — I342 Nonrheumatic mitral (valve) stenosis: Secondary | ICD-10-CM

## 2020-02-06 DIAGNOSIS — Z79899 Other long term (current) drug therapy: Secondary | ICD-10-CM | POA: Diagnosis not present

## 2020-02-06 DIAGNOSIS — Z79890 Hormone replacement therapy (postmenopausal): Secondary | ICD-10-CM | POA: Insufficient documentation

## 2020-02-06 DIAGNOSIS — I10 Essential (primary) hypertension: Secondary | ICD-10-CM | POA: Insufficient documentation

## 2020-02-06 DIAGNOSIS — F419 Anxiety disorder, unspecified: Secondary | ICD-10-CM | POA: Insufficient documentation

## 2020-02-06 DIAGNOSIS — I059 Rheumatic mitral valve disease, unspecified: Secondary | ICD-10-CM

## 2020-02-06 DIAGNOSIS — J449 Chronic obstructive pulmonary disease, unspecified: Secondary | ICD-10-CM | POA: Diagnosis not present

## 2020-02-06 HISTORY — PX: TEE WITHOUT CARDIOVERSION: SHX5443

## 2020-02-06 HISTORY — PX: BUBBLE STUDY: SHX6837

## 2020-02-06 LAB — GLUCOSE, CAPILLARY: Glucose-Capillary: 96 mg/dL (ref 70–99)

## 2020-02-06 SURGERY — ECHOCARDIOGRAM, TRANSESOPHAGEAL
Anesthesia: Monitor Anesthesia Care

## 2020-02-06 MED ORDER — LIDOCAINE HCL (CARDIAC) PF 100 MG/5ML IV SOSY
PREFILLED_SYRINGE | INTRAVENOUS | Status: DC | PRN
  Administered 2020-02-06: 60 mg via INTRATRACHEAL

## 2020-02-06 MED ORDER — PROPOFOL 10 MG/ML IV BOLUS
INTRAVENOUS | Status: DC | PRN
  Administered 2020-02-06: 20 mg via INTRAVENOUS
  Administered 2020-02-06: 30 mg via INTRAVENOUS

## 2020-02-06 MED ORDER — SODIUM CHLORIDE 0.9 % IV SOLN: INTRAVENOUS | Status: DC

## 2020-02-06 MED ORDER — LACTATED RINGERS IV SOLN
INTRAVENOUS | Status: AC | PRN
  Administered 2020-02-06: 1000 mL via INTRAVENOUS

## 2020-02-06 MED ORDER — PROPOFOL 500 MG/50ML IV EMUL
INTRAVENOUS | Status: DC | PRN
  Administered 2020-02-06: 50 ug/kg/min via INTRAVENOUS

## 2020-02-06 NOTE — Anesthesia Preprocedure Evaluation (Signed)
Anesthesia Evaluation  Patient identified by MRN, date of birth, ID band Patient awake    Reviewed: Allergy & Precautions, H&P , NPO status , Patient's Chart, lab work & pertinent test results, reviewed documented beta blocker date and time   History of Anesthesia Complications Negative for: history of anesthetic complications  Airway Mallampati: I  TM Distance: >3 FB Neck ROM: full    Dental  (+) Dental Advidsory Given, Teeth Intact   Pulmonary neg shortness of breath, COPD, neg recent URI, Current Smoker and Patient abstained from smoking.,    Pulmonary exam normal        Cardiovascular Exercise Tolerance: Good hypertension, Pt. on medications and Pt. on home beta blockers (-) angina(-) Past MI and (-) Cardiac Stents + dysrhythmias Atrial Fibrillation + Valvular Problems/Murmurs (had a PFO, now repaired)  Rhythm:Regular Rate:Normal     Neuro/Psych neg Seizures PSYCHIATRIC DISORDERS Anxiety Depression CVA, Residual Symptoms    GI/Hepatic Neg liver ROS, GERD  Medicated,  Endo/Other  diabetes, Type 2Hypothyroidism   Renal/GU Renal InsufficiencyRenal disease  negative genitourinary   Musculoskeletal   Abdominal Normal abdominal exam  (+)   Peds  Hematology  (+) Blood dyscrasia, anemia ,   Anesthesia Other Findings Past Medical History: No date: Anemia     Comment:  past  hx No date: Anxiety No date: Depression No date: Diabetes mellitus without complication ([facility]) No date: Dysrhythmia     Comment:  prior to PFO correction No date: GERD (gastroesophageal reflux disease) No date: Headache     Comment:  before PFO closure No date: Hypercholesteremia No date: Hypertension No date: Hypothyroidism No date: 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 No date: PFO (patent foramen ovale)     Comment:  correction device placed 2010 No date: Right leg numbness     Comment:  lower leg- residual from CVA No  date: Seasonal allergies No date: Stroke [facility])     Comment:  prior to PFO correction   Reproductive/Obstetrics negative OB ROS                             Anesthesia Physical  Anesthesia Plan  ASA: III  Anesthesia Plan: MAC   Post-op Pain Management:    Induction: Intravenous  PONV Risk Score and Plan: 1 and Propofol infusion and TIVA  Airway Management Planned: Natural Airway and Mask  Additional Equipment: TEE  Intra-op Plan:   Post-operative Plan:   Informed Consent: I have reviewed the patients History and Physical, chart, labs and discussed the procedure including the risks, benefits and alternatives for the proposed anesthesia with the patient or authorized representative who has indicated his/her understanding and acceptance.     Dental Advisory Given  Plan Discussed with: CRNA  Anesthesia Plan Comments:         Anesthesia Quick Evaluation

## 2020-02-06 NOTE — Discharge Instructions (Signed)

## 2020-02-06 NOTE — Op Note (Signed)
INDICATIONS: stroke, mitral valve mass  PROCEDURE:   Informed consent was obtained prior to the procedure. The risks, benefits and alternatives for the procedure were discussed and the patient comprehended these risks.  Risks include, but are not limited to, cough, sore throat, vomiting, nausea, somnolence, esophageal and stomach trauma or perforation, bleeding, low blood pressure, aspiration, pneumonia, infection, trauma to the teeth and death.    After a procedural time-out, the oropharynx was anesthetized with 20% benzocaine spray.   During this procedure the patient was administered IV propofol by Anesthesiology.  The transesophageal probe was inserted in the esophagus and stomach without difficulty and multiple views were obtained.  The patient was kept under observation until the patient left the procedure room.  The patient left the procedure room in stable condition.   Agitated microbubble saline contrast was administered.  COMPLICATIONS:    There were no immediate complications.  FINDINGS:  Normal LV function. The left atrium is dilated, the left atrial appendage is very large, there is prominent spontaneous echo contrast, but there is no left atrial thrombus. 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 There is at least moderate, highly eccentric MR. There is no pulmonary vein flow reversal. Well positioned Amplatzer atrial septal occluder without evidence of shunt by color Doppler or by saline contrast. Mild aortic atherosclerosis.  RECOMMENDATIONS:    No evidence of intracardiac thrombus or mass. 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 Well seated atrial septal occluder without  residual shunt.  Time Spent Directly with the Patient:  45 minutes   Erin Yates 02/06/2020, 9:41 AM

## 2020-02-06 NOTE — Progress Notes (Signed)
  Echocardiogram Echocardiogram Transesophageal has been performed.  Erin Yates 02/06/2020, 9:59 AM

## 2020-02-06 NOTE — Anesthesia Procedure Notes (Signed)
Procedure Name: MAC Date/Time: 02/06/2020 9:16 AM Performed by: Kathryne Hitch, CRNA Pre-anesthesia Checklist: Patient identified, Emergency Drugs available, Suction available, Patient being monitored and Timeout performed Patient Re-evaluated:Patient Re-evaluated prior to induction Oxygen Delivery Method: Nasal cannula Preoxygenation: Pre-oxygenation with 100% oxygen Induction Type: IV induction Dental Injury: Teeth and Oropharynx as per pre-operative assessment

## 2020-02-06 NOTE — H&P (Signed)
Cardiology Admission History and Physical:   Patient ID: Erin Yates MRN: [id number]; DOB: 09-Aug-1946   Admission date: 02/06/2020  Primary Care Provider: Adin Hector, MD Primary Cardiologist: Virl Axe, MD  Primary Electrophysiologist:  Virl Axe, MD   Chief Complaint:  Cryptogenic stroke, mitral valve mass  Patient Profile:   Erin Yates is a 74 y.o. 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  History of Present Illness:   Erin Yates is referred by Dr. [name] for evaluation of the mitral valve, for a small calcified opacity attached to the posterior mitral leaflet has been described.  She has had 7 strokes and recently lost vision with the most recent stroke.  PFO was closed about 4 years ago with an Amplatzer device and she has had subsequent strokes.  She has had asymptomatic atrial fibrillation.  She is here for transesophageal echocardiogram to evaluate the mitral valve mass.   Past Medical History:  Diagnosis Date  . Anemia    past  hx  . Anxiety   . Depression   . Diabetes mellitus without complication ([place])   . Dysrhythmia    prior to PFO correction  . GERD (gastroesophageal reflux disease)   . Headache    before PFO closure  . Hypercholesteremia   . Hypertension   . Hypothyroidism   . 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 PFO (patent foramen ovale)    correction device placed 2010  . Right leg numbness    lower leg- residual from CVA  . Seasonal allergies   . Stroke [facility])    prior to PFO correction    Past Surgical History:  Procedure Laterality Date  . ABDOMINAL HYSTERECTOMY    . CARDIAC CATHETERIZATION     [facility]  . CATARACT EXTRACTION W/PHACO Right 09/01/2015   Procedure: CATARACT EXTRACTION PHACO AND INTRAOCULAR LENS PLACEMENT (IOC);  Surgeon:  Leandrew Koyanagi, MD;  Location: [place];  Service: Ophthalmology;  Laterality: Right;  DIABETIC - oral meds  . CATARACT EXTRACTION W/PHACO Left 10/20/2015   Procedure: CATARACT EXTRACTION PHACO AND INTRAOCULAR LENS PLACEMENT (IOC);  Surgeon: Leandrew Koyanagi, MD;  Location: [place];  Service: Ophthalmology;  Laterality: Left;  DIABETIC - oral meds  . EP IMPLANTABLE DEVICE N/A 04/04/2016   Procedure: Loop Recorder Insertion;  Surgeon: Deboraha Sprang, MD;  Location: [place] [facility];  Service: Cardiovascular;  Laterality: N/A;  . FOOT NEUROMA SURGERY Left   . NASAL SINUS SURGERY    . PATENT FORAMEN OVALE CLOSURE    . PATENT FORAMEN OVALE CLOSURE    . TEE WITHOUT CARDIOVERSION N/A 02/09/2016   Procedure: TRANSESOPHAGEAL ECHOCARDIOGRAM (TEE);  Surgeon: Adrian Prows, MD;  Location: [place];  Service: Cardiovascular;  Laterality: N/A;  . TEE WITHOUT CARDIOVERSION N/A 11/12/2019   Procedure: TRANSESOPHAGEAL ECHOCARDIOGRAM (TEE);  Surgeon: Minna Merritts, MD;  Location: [facility];  Service: Cardiovascular;  Laterality: N/A;     Medications Prior to Admission: Prior to Admission medications   Medication Sig Start Date End Date Taking? Authorizing Provider  ALPRAZolam (XANAX) 0.25 MG tablet Take 0.25 mg by mouth 2 (two) times daily as needed for anxiety.   Yes [provider]  amLODipine (NORVASC) 10 MG tablet Take 10 mg by mouth every evening.    Yes [provider]  apixaban (  ELIQUIS) 5 MG TABS tablet Take 1 tablet (5 mg total) by mouth 2 (two) times daily. 11/19/19  Yes Dhungel, Nishant, MD  atorvastatin (LIPITOR) 80 MG tablet Take 80 mg by mouth daily.   Yes [provider]  buPROPion (WELLBUTRIN SR) 150 MG 12 hr tablet Take 1 tablet (150 mg total) by mouth 2 (two) times daily. 03/27/16  Yes Garvin Fila, MD  cholecalciferol (VITAMIN D3) 25 MCG (1000 UT) tablet Take 1,000 Units by mouth daily.   Yes [provider]    desvenlafaxine (PRISTIQ) 100 MG 24 hr tablet Take 100 mg by mouth daily. 12/05/19  Yes [provider]  ferrous sulfate 325 (65 FE) MG tablet Take 325 mg by mouth every evening.   Yes [provider]  levothyroxine (SYNTHROID, LEVOTHROID) 50 MCG tablet Take 50 mcg by mouth daily before breakfast.   Yes [provider]  losartan (COZAAR) 100 MG tablet Take 100 mg by mouth daily.    Yes [provider]  metFORMIN (GLUCOPHAGE) 1000 MG tablet Take 1,000 mg by mouth 2 (two) times daily with a meal.   Yes [provider]  metoprolol succinate (TOPROL-XL) 100 MG 24 hr tablet Take 50 mg by mouth daily. Take with or immediately following a meal.   Yes [provider]  pantoprazole (PROTONIX) 40 MG tablet Take 40 mg by mouth 2 (two) times daily. Reported on 04/17/2016   Yes [provider]  traZODone (DESYREL) 50 MG tablet Take 50 mg by mouth at bedtime as needed.    Yes [provider]  vitamin B-12 (CYANOCOBALAMIN) 500 MCG tablet Take 500 mcg by mouth every evening.    Yes [provider]  fluticasone (FLONASE) 50 MCG/ACT nasal spray Place into both nostrils daily as needed for allergies or rhinitis.    [provider]  hydrochlorothiazide (MICROZIDE) 12.5 MG capsule Take 12.5 mg by mouth daily as needed (fluid retention (feet/leg swelling)).     [provider]  metoprolol succinate (TOPROL-XL) 50 MG 24 hr tablet Take 1 tablet (50 mg total) by mouth daily. Take with or immediately following a meal. Patient not taking: Reported on 01/29/2020 12/30/19 03/29/20  Deboraha Sprang, MD  nicotine (NICODERM CQ - DOSED IN MG/24 HOURS) 21 mg/24hr patch Place 1 patch (21 mg total) onto the skin daily. Patient not taking: Reported on 12/04/2019 11/14/19   Dhungel, Flonnie Overman, MD  sulfamethoxazole-trimethoprim (BACTRIM DS) 800-160 MG tablet Take 1 tablet by mouth 2 (two) times daily. Patient not taking: Reported on 01/29/2020 12/14/19    Sherrie George B, FNP  traMADol (ULTRAM) 50 MG tablet Take 1 tablet (50 mg total) by mouth every 12 (twelve) hours as needed. Patient not taking: Reported on 01/29/2020 12/14/19   Victorino Dike, FNP     Allergies:    Allergies  Allergen Reactions  . Alendronate Other (See Comments)    Rash   . Penicillins Rash    Also swelling and itching  Did it involve swelling of the face/tongue/throat, SOB, or low BP? Yes Did it involve sudden or severe rash/hives, skin peeling, or any reaction on the inside of your mouth or nose? Yes Did you need to seek medical attention at a hospital or doctor's office? Yes When did it last happen?More than 15 years ago If all above answers are "NO", may proceed with cephalosporin use.     Social History:   Social History   Socioeconomic History  . Marital status: Married  Spouse name: Not on file  . Number of children: Not on file  . Years of education: Not on file  . Highest education level: Not on file  Occupational History  . Not on file  Tobacco Use  . Smoking status: Current Every Day Smoker    Packs/day: 1.00    Years: 55.00    Pack years: 55.00  . Smokeless tobacco: Never Used  Substance and Sexual Activity  . Alcohol use: No  . Drug use: No  . Sexual activity: Not on file  Other Topics Concern  . Not on file  Social History Narrative  . Not on file   Social Determinants of Health   Financial Resource Strain:   . Difficulty of Paying Living Expenses:   Food Insecurity:   . Worried About Charity fundraiser in the Last Year:   . Arboriculturist in the Last Year:   Transportation Needs:   . Film/video editor (Medical):   Marland Kitchen Lack of Transportation (Non-Medical):   Physical Activity:   . Days of Exercise per Week:   . Minutes of Exercise per Session:   Stress:   . Feeling of Stress :   Social Connections:   . Frequency of Communication with Friends and Family:   . Frequency of Social Gatherings with Friends and  Family:   . Attends Religious Services:   . Active Member of Clubs or Organizations:   . Attends Archivist Meetings:   Marland Kitchen Marital Status:   Intimate Partner Violence:   . Fear of Current or Ex-Partner:   . Emotionally Abused:   Marland Kitchen Physically Abused:   . Sexually Abused:     Family History:   The patient's family history includes Breast cancer (age of onset: 4) in her sister; Dementia in her father.    ROS:  Please see the history of present illness.  All other ROS reviewed and negative.     Physical Exam/Data:   Vitals:   02/06/20 0758  BP: (!) 168/53  Pulse: (!) 49  Resp: 14  Temp: 98 F (36.7 C)  TempSrc: Oral  SpO2: 96%  Weight: 59 kg  Height: 5\' 5"  (1.651 m)   No intake or output data in the 24 hours ending 02/06/20 0821 Last 3 Weights 02/06/2020 12/30/2019 12/14/2019  Weight (lbs) 130 lb 133 lb 134 lb 8 oz  Weight (kg) 58.968 kg 60.328 kg 61.009 kg     Body mass index is 21.63 kg/m.  General:  Well nourished, well developed, in no acute distress HEENT: normal Lymph: no adenopathy Neck: no JVD Endocrine:  No thryomegaly Vascular: No carotid bruits; FA pulses 2+ bilaterally without bruits  Cardiac:  normal S1, S2; RRR; no murmur  Lungs:  clear to auscultation bilaterally, no wheezing, rhonchi or rales  Abd: soft, nontender, no hepatomegaly  Ext: no edema Musculoskeletal:  No deformities, BUE and BLE strength normal and equal Skin: warm and dry  Neuro:  CNs 2-12 intact, no focal abnormalities noted Psych:  Normal affect    EKG:  The ECG that was done 12/30/2019 was personally reviewed and demonstrates marked sinus bradycardia and left axis deviation  Relevant CV Studies: TEE performed on November 12, 2019 1. Left ventricular ejection fraction, by visual estimation, is 55 %. The  left ventricle has normal function. There is no left ventricular  hypertrophy.  2. The left ventricle is not well visualized.  3. Global right ventricle has normal  systolic function.The right  ventricular size is normal. No increase in right ventricular wall  thickness.  4. Left atrial size was normal.  5. Mitral valve with small calcified opacity at the base of the posterior  leaflet, anterior side of the leaflet. Etiology unclear, likely part of  calcified posterior leaflet. Mild eccentric mitral valve regurgitation. No  evidence of mitral stenosis.  6. TR signal is inadequate for assessing pulmonary artery systolic  pressure.  7. No PFO, negative saline contrast bubble study.  8. No LA or LA appendage thrombus.  Laboratory Data:  High Sensitivity Troponin:  No results for input(s): TROPONINIHS in the last 720 hours.    Chemistry Recent Labs  Lab 02/04/20 1121  NA 139  K 4.6  CL 109  CO2 21*  GLUCOSE 177*  BUN 19  CREATININE 1.59*  CALCIUM 9.4  GFRNONAA 32*  GFRAA 37*  ANIONGAP 9    No results for input(s): PROT, ALBUMIN, AST, ALT, ALKPHOS, BILITOT in the last 168 hours. Hematology Recent Labs  Lab 02/04/20 1121  WBC 6.3  RBC 3.58*  HGB 11.3*  HCT 34.6*  MCV 96.6  MCH 31.6  MCHC 32.7  RDW 13.5  PLT 244   BNPNo results for input(s): BNP, PROBNP in the last 168 hours.  DDimer No results for input(s): DDIMER in the last 168 hours.   Radiology/Studies:  No results found. {   Assessment and Plan:   1. The patient is here to undergo follow-up transesophageal echocardiography to evaluate the potential mitral valve mass in the setting of recurrent embolic strokes. This procedure has been fully reviewed with the patient and written informed consent has been obtained.   For questions or updates, please contact [place] Please consult [email or web address] for contact info under        Signed, Sanda Klein, MD  02/06/2020 8:21 AM

## 2020-02-06 NOTE — Transfer of Care (Signed)
Immediate Anesthesia Transfer of Care Note  Patient: Erin Yates  Procedure(s) Performed: TRANSESOPHAGEAL ECHOCARDIOGRAM (TEE) (N/A ) BUBBLE STUDY  Patient Location: Endoscopy Unit  Anesthesia Type:MAC  Level of Consciousness: drowsy and patient cooperative  Airway & Oxygen Therapy: Patient Spontanous Breathing and Patient connected to nasal cannula oxygen  Post-op Assessment: Report given to RN and Post -op Vital signs reviewed and stable  Post vital signs: Reviewed and stable  Last Vitals:  Vitals Value Taken Time  BP    Temp    Pulse    Resp    SpO2      Last Pain:  Vitals:   02/06/20 0944  TempSrc:   PainSc: Asleep         Complications: No apparent anesthesia complications

## 2020-02-11 NOTE — Anesthesia Postprocedure Evaluation (Signed)
Anesthesia Post Note  Patient: Erin Yates  Procedure(s) Performed: TRANSESOPHAGEAL ECHOCARDIOGRAM (TEE) (N/A ) BUBBLE STUDY     Patient location during evaluation: Endoscopy Anesthesia Type: MAC Level of consciousness: awake and sedated Pain management: pain level controlled Vital Signs Assessment: post-procedure vital signs reviewed and stable Respiratory status: spontaneous breathing Cardiovascular status: stable Postop Assessment: no apparent nausea or vomiting Anesthetic complications: no    Last Vitals:  Vitals:   02/06/20 0955 02/06/20 1005  BP: (!) 145/56 (!) 143/69  Pulse: (!) 53 (!) 50  Resp: 14 15  Temp:    SpO2: 93% 95%    Last Pain:  Vitals:   02/09/20 0851  TempSrc:   PainSc: 0-No pain   Pain Goal:                   Huston Foley

## 2020-02-13 ENCOUNTER — Telehealth: Payer: Self-pay

## 2020-02-13 NOTE — Telephone Encounter (Signed)
-----   Message from Deboraha Sprang, MD sent at 02/11/2020  8:30 PM EDT ----- Please Inform Patient Echo showed  some floppiness of the mitral valve giving rise to the appearance;  her valve leakage is only moderate so nothing to do special at this point  Thanks SK

## 2020-02-13 NOTE — Telephone Encounter (Signed)
Call to patient to review echo results.   Pt verbalized understanding.   She is inquiring on when she needs follow up. I will reach out to Dr. [name] to see when she needs to return.

## 2020-02-14 NOTE — Telephone Encounter (Signed)
I  lets say 6 months Thanks SK

## 2020-02-23 ENCOUNTER — Other Ambulatory Visit (HOSPITAL_COMMUNITY): Payer: Self-pay | Admitting: Nurse Practitioner

## 2020-02-23 NOTE — Telephone Encounter (Signed)
Refill request for Eliquis

## 2020-03-02 ENCOUNTER — Telehealth: Payer: Self-pay | Admitting: Internal Medicine

## 2020-03-02 NOTE — Telephone Encounter (Signed)
-----   Message from Emily Filbert, RN sent at 02/27/2020 11:10 AM EDT ----- Please call to arrange a 6 month follow up appointment with Dr. [name] (post TEE on 02/06/20). I have sent her a MyChart message to let her know I would ask one of you to reach out to her.   Thank you! Nira Conn

## 2020-03-02 NOTE — Telephone Encounter (Signed)
Scheduled

## 2020-03-16 ENCOUNTER — Ambulatory Visit (INDEPENDENT_AMBULATORY_CARE_PROVIDER_SITE_OTHER): Payer: Medicare HMO | Admitting: Internal Medicine

## 2020-03-16 ENCOUNTER — Encounter: Payer: Self-pay | Admitting: Internal Medicine

## 2020-03-16 ENCOUNTER — Other Ambulatory Visit: Payer: Self-pay

## 2020-03-16 VITALS — BP 140/60 | HR 52 | Ht 65.0 in | Wt 135.2 lb

## 2020-03-16 DIAGNOSIS — I480 Paroxysmal atrial fibrillation: Secondary | ICD-10-CM

## 2020-03-16 DIAGNOSIS — R001 Bradycardia, unspecified: Secondary | ICD-10-CM

## 2020-03-16 DIAGNOSIS — I639 Cerebral infarction, unspecified: Secondary | ICD-10-CM | POA: Diagnosis not present

## 2020-03-16 NOTE — Patient Instructions (Signed)
Medication Instructions:  - Your physician has recommended you make the following change in your medication:   1) Decrease metoprolol succinate 100 mg- take 1/4 tablet (25 mg) by mouth once daily   - call the office in 2 weeks with how you are feeling on the lower dose metoprolol, or if we need to send in the lower dose metoprolol for you (if it is too hard to 1/4).   *If you need a refill on your cardiac medications before your next appointment, please call your pharmacy*   Lab Work: - none ordered  If you have labs (blood work) drawn today and your tests are completely normal, you will receive your results only by: Marland Kitchen MyChart Message (if you have MyChart) OR . A paper copy in the mail If you have any lab test that is abnormal or we need to change your treatment, we will call you to review the results.   Testing/Procedures: - none ordered   Follow-Up: At [facility], you and your health needs are our priority.  As part of our continuing mission to provide you with exceptional heart care, we have created designated Provider Care Teams.  These Care Teams include your primary Cardiologist (physician) and Advanced Practice Providers (APPs -  Physician Assistants and Nurse Practitioners) who all work together to provide you with the care you need, when you need it.  We recommend signing up for the patient portal called "MyChart".  Sign up information is provided on this After Visit Summary.  MyChart is used to connect with patients for Virtual Visits (Telemedicine).  Patients are able to view lab/test results, encounter notes, upcoming appointments, etc.  Non-urgent messages can be sent to your provider as well.   To learn more about what you can do with MyChart, go to [email or web address].    Your next appointment:   6 month(s)  The format for your next appointment:   In Person  Provider:   Virl Axe, MD   Other Instructions - n/a

## 2020-03-16 NOTE — Progress Notes (Signed)
Patient Care Team: Adin Hector, MD as PCP - General (Internal Medicine) Deboraha Sprang, MD as PCP - Cardiology (Cardiology) Deboraha Sprang, MD as PCP - Electrophysiology (Cardiology)   HPI  Erin Yates is a 74 y.o. female Seen in the past by Dr. Carlyn Reichert because of palpitations in the context of prior closure of the PFO and cryptogenic stroke.  TEE 4/17 was normal with Amplatzer closure device in good position    Device >>SCAF >> .apixoban issue of compliance related to cost--but continuing   She has had now 7 strokes. She has lost vision with one of the more recent strokes   She had bradycardia Dr. Caryn Section decreased her metoprolol from 200--100->>50 better but still limited by dyspnea and fatigue  Some edema   No chest pain   Long standing hypertension   Date Cr K Hgb  5/19.  1.1 5.2   1/20 1/4 4.8   3/21 1.59 4.6 11.3   DATE TEST EF   4/17 Echo   55-60 %   1/21 Echo  55-60 % "ill defined density post leaflet"  4/21 TEE 60-65 Prolapsed P3 leaflet        Past Medical History:  Diagnosis Date  . Anemia    past  hx  . Anxiety   . Depression   . Diabetes mellitus without complication ([place])   . Dysrhythmia    prior to PFO correction  . GERD (gastroesophageal reflux disease)   . Headache    before PFO closure  . Hypercholesteremia   . Hypertension   . Hypothyroidism   . 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 PFO (patent foramen ovale)    correction device placed 2010  . Right leg numbness    lower leg- residual from CVA  . Seasonal allergies   . Stroke [facility])    prior to PFO correction    Past Surgical History:  Procedure Laterality Date  . ABDOMINAL HYSTERECTOMY    . BUBBLE STUDY  02/06/2020   Procedure: BUBBLE STUDY;  Surgeon: Sanda Tieasha Larsen, MD;  Location: [place] [facility];  Service: Cardiovascular;;  . [place]  . CATARACT EXTRACTION W/PHACO Right 09/01/2015   Procedure: CATARACT EXTRACTION PHACO AND  INTRAOCULAR LENS PLACEMENT (IOC);  Surgeon: Leandrew Koyanagi, MD;  Location: [place];  Service: Ophthalmology;  Laterality: Right;  DIABETIC - oral meds  . CATARACT EXTRACTION W/PHACO Left 10/20/2015   Procedure: CATARACT EXTRACTION PHACO AND INTRAOCULAR LENS PLACEMENT (IOC);  Surgeon: Leandrew Koyanagi, MD;  Location: [place];  Service: Ophthalmology;  Laterality: Left;  DIABETIC - oral meds  . EP IMPLANTABLE DEVICE N/A 04/04/2016   Procedure: Loop Recorder Insertion;  Surgeon: Deboraha Sprang, MD;  Location: [place] [facility];  Service: Cardiovascular;  Laterality: N/A;  . FOOT NEUROMA SURGERY Left   . NASAL SINUS SURGERY    . PATENT FORAMEN OVALE CLOSURE    . PATENT FORAMEN OVALE CLOSURE    . TEE WITHOUT CARDIOVERSION N/A 02/09/2016   Procedure: TRANSESOPHAGEAL ECHOCARDIOGRAM (TEE);  Surgeon: Adrian Prows, MD;  Location: [place];  Service: Cardiovascular;  Laterality: N/A;  . TEE WITHOUT CARDIOVERSION N/A 11/12/2019   Procedure: TRANSESOPHAGEAL ECHOCARDIOGRAM (TEE);  Surgeon: Minna Merritts, MD;  Location: [facility];  Service: Cardiovascular;  Laterality: N/A;  . TEE WITHOUT CARDIOVERSION N/A 02/06/2020   Procedure: TRANSESOPHAGEAL ECHOCARDIOGRAM (TEE);  Surgeon: Sanda Kalieb Freeland, MD;  Location: [place];  Service: Cardiovascular;  Laterality: N/A;    Current Outpatient Medications  Medication Sig Dispense Refill  . ALPRAZolam (XANAX) 0.25 MG tablet Take 0.25 mg by mouth 2 (two) times daily as needed for anxiety.    Marland Kitchen amLODipine (NORVASC) 10 MG tablet Take 10 mg by mouth every evening.     Marland Kitchen atorvastatin (LIPITOR) 80 MG tablet Take 80 mg by mouth daily.    Marland Kitchen buPROPion (WELLBUTRIN SR) 150 MG 12 hr tablet Take 1 tablet (150 mg total) by mouth 2 (two) times daily. 60 tablet 5  . cholecalciferol (VITAMIN D3) 25 MCG (1000 UT) tablet Take 1,000 Units by mouth daily.    Marland Kitchen desvenlafaxine (PRISTIQ) 100 MG 24 hr tablet Take 100 mg by mouth daily.    Marland Kitchen ELIQUIS 5 MG  TABS tablet TAKE 1 TABLET (5 MG TOTAL) BY MOUTH 2 (TWO) TIMES DAILY. 180 tablet 1  . ferrous sulfate 325 (65 FE) MG tablet Take 325 mg by mouth every evening.    . fluticasone (FLONASE) 50 MCG/ACT nasal spray Place into both nostrils daily as needed for allergies or rhinitis.    . hydrochlorothiazide (MICROZIDE) 12.5 MG capsule Take 12.5 mg by mouth daily as needed (fluid retention (feet/leg swelling)).     Marland Kitchen levothyroxine (SYNTHROID, LEVOTHROID) 50 MCG tablet Take 50 mcg by mouth daily before breakfast.    . losartan (COZAAR) 100 MG tablet Take 100 mg by mouth daily.     . metFORMIN (GLUCOPHAGE) 1000 MG tablet Take 1,000 mg by mouth 2 (two) times daily with a meal.    . metoprolol succinate (TOPROL-XL) 100 MG 24 hr tablet Take 50 mg by mouth daily. Take with or immediately following a meal.    . pantoprazole (PROTONIX) 40 MG tablet Take 40 mg by mouth 2 (two) times daily. Reported on 04/17/2016    . traZODone (DESYREL) 50 MG tablet Take 50 mg by mouth at bedtime as needed.     . vitamin B-12 (CYANOCOBALAMIN) 500 MCG tablet Take 500 mcg by mouth every evening.      No current facility-administered medications for this visit.    Allergies  Allergen Reactions  . Alendronate Other (See Comments)    Rash   . Penicillins Rash    Also swelling and itching  Did it involve swelling of the face/tongue/throat, SOB, or low BP? Yes Did it involve sudden or severe rash/hives, skin peeling, or any reaction on the inside of your mouth or nose? Yes Did you need to seek medical attention at a hospital or doctor's office? Yes When did it last happen?More than 15 years ago If all above answers are "NO", may proceed with cephalosporin use.       Review of Systems negative except from HPI and PMH  Physical Exam BP 140/60 (BP Location: Left Arm, Patient Position: Sitting, Cuff Size: Normal)   Pulse (!) 52   Ht 5\' 5"  (1.651 m)   Wt 135 lb 4 oz (61.3 kg)   SpO2 98%   BMI 22.51 kg/m  Well developed  and nourished in no acute distress HENT normal Neck supple with JVP-  flat   Clear Slow but egular rate and rhythm, no murmurs or gallops Abd-soft with active BS No Clubbing cyanosis tr edema Skin-warm and dry A & Oriented  Grossly normal sensory and motor function word finding difficulty   ECG sinus@ 53 23/08/44   Assessment and  Plan\ Cryptogenic stroke  Hypertension  Subclinical atrial fibrillation  Sinus bradycardia-symptomatic  PFO-Amplatzer closure  Mitral valve lesion-calcified  Symptomatic bradycardia,  Walk in the office HR 52>>60;  Discussion with family re further reducing the BB, somewhat reluctant, as much improved with last change.  I think she would be better off prob with none and maybe even with pacemaker  Broached this subject this am  BP reasonably controlled; some of the edema may be 2/2 amlodipine and ifneed augmented BP control can maybe make HCTZ daily  Will review with BK 03/16/2020 12:22 PM

## 2020-03-19 ENCOUNTER — Telehealth: Payer: Self-pay | Admitting: Internal Medicine

## 2020-03-19 MED ORDER — METOPROLOL SUCCINATE ER 25 MG PO TB24: 25.0000 mg | ORAL_TABLET | Freq: Every day | ORAL | 3 refills | Status: DC

## 2020-03-19 NOTE — Telephone Encounter (Signed)
I spoke with the patient. She was seen by Dr. [name] on 5/11 and he decreased her metoprolol succinate dose down to 25 mg once daily. She has a 100 mg tablet and was going to try to 1/4 this for the next 2 weeks to see how she tolerated it. She called today stating the tablet is crumbling and she would like me to go ahead and send in a metoprolol succinate 25 mg tablet to [facility]. She states she will continue to cut the 100 mg tablets until her RX arrives from [place].  An updated RX has been sent in.

## 2020-03-19 NOTE — Telephone Encounter (Signed)
Please call to discuss Toprol doseage. Patient states she is unable to cut this medication without it "crumbling".

## 2020-04-01 ENCOUNTER — Telehealth: Payer: Self-pay | Admitting: Internal Medicine

## 2020-04-01 DIAGNOSIS — R519 Headache, unspecified: Secondary | ICD-10-CM

## 2020-04-01 NOTE — Telephone Encounter (Signed)
Pt c/o medication issue:  1. Name of Medication: Metoprolol  2. How are you currently taking this medication (dosage and times per day)? 25 mg  3. Are you having a reaction (difficulty breathing--STAT)?   4. What is your medication issue? HR is 63-66 now, patient states better than it was  Patient is calling back after this medication change for two weeks to update Dr. [name]   Please advise

## 2020-04-01 NOTE — Telephone Encounter (Signed)
I called and spoke with the patient. I advised her her heart rates look better on the lower dose metoprolol. I inquired how she was feeling. She states she hasn't felt too good this week. She reports that she fell out of her bed on Monday night. She thinks she was half asleep and couldn't remember how to get up. She rolled out and hit the back of her head on the night stand.  She is complaining of some headaches this week and a knot that is decreasing in size behind her right ear.   I have advised her that with her being on eliquis I just wanted to run this by Dr. [name] to make sure he didn't want [place] to order a head CT to evaluate for a bleed.   The patient is agreeable with the above and is aware I will call her back this afternoon with MD recommendations.

## 2020-04-01 NOTE — Telephone Encounter (Signed)
I spoke with Dr. [name] about the patient's fall. He advised the patient will need a noncontrasted CT of the head done. I advised the patient I will call 1st thing in the morning to try to get this set up for her.  She is aware this may be done tomorrow and is agreeable.

## 2020-04-02 ENCOUNTER — Other Ambulatory Visit: Payer: Self-pay | Admitting: *Deleted

## 2020-04-02 ENCOUNTER — Ambulatory Visit
Admission: RE | Admit: 2020-04-02 | Discharge: 2020-04-02 | Disposition: A | Discharge status: Home / Self Care | Payer: Medicare HMO | Source: Ambulatory Visit | Attending: Internal Medicine | Admitting: Internal Medicine

## 2020-04-02 ENCOUNTER — Other Ambulatory Visit: Payer: Self-pay

## 2020-04-02 DIAGNOSIS — R519 Headache, unspecified: Secondary | ICD-10-CM | POA: Diagnosis not present

## 2020-04-02 NOTE — Telephone Encounter (Signed)
Order placed for a STAT head CT to be done today. Authorization received from the Pre-Cert Dept. Authorization # [id number] Exp: 05/02/20  Spoke with scheduling. They can scan the patient at 10:30 am at [facility].  I have notified the patient and she is aware and agreeable. I spoke with her at 10:00 am. She is going to try to get to [facility] by 10:30 am.

## 2020-04-07 NOTE — Telephone Encounter (Signed)
Late entry: Patient had a head CT done on 04/02/20. This came back negative for a bleed. Dr. [name] and the patient were both notified of these results on 04/02/20.

## 2020-04-13 ENCOUNTER — Ambulatory Visit: Admission: RE | Admit: 2020-04-13 | Payer: Medicare HMO | Source: Ambulatory Visit

## 2020-06-29 DIAGNOSIS — E7849 Other hyperlipidemia: Secondary | ICD-10-CM | POA: Diagnosis not present

## 2020-06-29 DIAGNOSIS — E538 Deficiency of other specified B group vitamins: Secondary | ICD-10-CM | POA: Diagnosis not present

## 2020-06-29 DIAGNOSIS — R809 Proteinuria, unspecified: Secondary | ICD-10-CM | POA: Diagnosis not present

## 2020-06-29 DIAGNOSIS — E1129 Type 2 diabetes mellitus with other diabetic kidney complication: Secondary | ICD-10-CM | POA: Diagnosis not present

## 2020-07-30 ENCOUNTER — Telehealth: Payer: Self-pay | Admitting: *Deleted

## 2020-07-30 NOTE — Telephone Encounter (Signed)
(  07/30/2020) Pt notified that lung cancer screening imaging is due currently or in the near future. Verified smoking history (Current Smoker,1 ppd ). Tentative appt on 09/01/20 @ 9:30 am [facility]

## 2020-08-01 ENCOUNTER — Other Ambulatory Visit: Payer: Self-pay | Admitting: *Deleted

## 2020-08-01 DIAGNOSIS — Z87891 Personal history of nicotine dependence: Secondary | ICD-10-CM

## 2020-08-01 DIAGNOSIS — Z122 Encounter for screening for malignant neoplasm of respiratory organs: Secondary | ICD-10-CM

## 2020-08-01 NOTE — Progress Notes (Signed)
Current smoker, 57 pack year

## 2020-08-04 DIAGNOSIS — I480 Paroxysmal atrial fibrillation: Secondary | ICD-10-CM | POA: Diagnosis not present

## 2020-08-04 DIAGNOSIS — Z0000 Encounter for general adult medical examination without abnormal findings: Secondary | ICD-10-CM | POA: Diagnosis not present

## 2020-08-04 DIAGNOSIS — E1151 Type 2 diabetes mellitus with diabetic peripheral angiopathy without gangrene: Secondary | ICD-10-CM | POA: Diagnosis not present

## 2020-08-04 DIAGNOSIS — E1129 Type 2 diabetes mellitus with other diabetic kidney complication: Secondary | ICD-10-CM | POA: Diagnosis not present

## 2020-08-04 DIAGNOSIS — D692 Other nonthrombocytopenic purpura: Secondary | ICD-10-CM | POA: Diagnosis not present

## 2020-08-04 DIAGNOSIS — K219 Gastro-esophageal reflux disease without esophagitis: Secondary | ICD-10-CM | POA: Diagnosis not present

## 2020-08-04 DIAGNOSIS — E034 Atrophy of thyroid (acquired): Secondary | ICD-10-CM | POA: Diagnosis not present

## 2020-08-04 DIAGNOSIS — R809 Proteinuria, unspecified: Secondary | ICD-10-CM | POA: Diagnosis not present

## 2020-08-04 DIAGNOSIS — I10 Essential (primary) hypertension: Secondary | ICD-10-CM | POA: Diagnosis not present

## 2020-09-01 ENCOUNTER — Ambulatory Visit: Admission: RE | Admit: 2020-09-01 | Payer: Medicare HMO | Source: Ambulatory Visit

## 2020-09-07 ENCOUNTER — Other Ambulatory Visit: Payer: Self-pay

## 2020-09-07 ENCOUNTER — Ambulatory Visit: Payer: Medicare HMO | Admitting: Internal Medicine

## 2020-09-07 ENCOUNTER — Ambulatory Visit (INDEPENDENT_AMBULATORY_CARE_PROVIDER_SITE_OTHER): Payer: Medicare HMO

## 2020-09-07 ENCOUNTER — Encounter: Payer: Self-pay | Admitting: Internal Medicine

## 2020-09-07 VITALS — BP 142/70 | HR 56 | Ht 65.0 in | Wt 131.0 lb

## 2020-09-07 DIAGNOSIS — I480 Paroxysmal atrial fibrillation: Secondary | ICD-10-CM

## 2020-09-07 DIAGNOSIS — I639 Cerebral infarction, unspecified: Secondary | ICD-10-CM

## 2020-09-07 DIAGNOSIS — R001 Bradycardia, unspecified: Secondary | ICD-10-CM | POA: Diagnosis not present

## 2020-09-07 NOTE — Patient Instructions (Signed)
Medication Instructions:  - Your physician has recommended you make the following change in your medication:   1) STOP metoprolol  *If you need a refill on your cardiac medications before your next appointment, please call your pharmacy*   Lab Work: - none ordered  If you have labs (blood work) drawn today and your tests are completely normal, you will receive your results only by: Marland Kitchen MyChart Message (if you have MyChart) OR . A paper copy in the mail If you have any lab test that is abnormal or we need to change your treatment, we will call you to review the results.   Testing/Procedures: 1) ZIO XT heart monitor:   - You will wear 2 monitors.  - One will be placed in clinic today (to be worn for 14 days) - The 2nd one will be mailed to you (this will be sent to your house in about 2 weeks)  Your physician has recommended that you wear a Zio monitor. This monitor is a medical device that records the heart's electrical activity. Doctors most often use these monitors to diagnose arrhythmias. Arrhythmias are problems with the speed or rhythm of the heartbeat. The monitor is a small device applied to your chest. You can wear one while you do your normal daily activities. While wearing this monitor if you have any symptoms to push the button and record what you felt. Once you have worn this monitor for the period of time provider prescribed (Usually 14 days), you will return the monitor device in the postage paid box. Once it is returned they will download the data collected and provide [place] with a report which the provider will then review and we will call you with those results. Important tips:  1. Avoid showering during the first 24 hours of wearing the monitor. 2. Avoid excessive sweating to help maximize wear time. 3. Do not submerge the device, no hot tubs, and no swimming pools. 4. Keep any lotions or oils away from the patch. 5. After 24 hours you may shower with the patch on. Take  brief showers with your back facing the shower head.  6. Do not remove patch once it has been placed because that will interrupt data and decrease adhesive wear time. 7. Push the button when you have any symptoms and write down what you were feeling. 8. Once you have completed wearing your monitor, remove and place into box which has postage paid and place in your outgoing mailbox.  9. If for some reason you have misplaced your box then call our office and we can provide another box and/or mail it off for you.         Follow-Up: At [facility], you and your health needs are our priority.  As part of our continuing mission to provide you with exceptional heart care, we have created designated Provider Care Teams.  These Care Teams include your primary Cardiologist (physician) and Advanced Practice Providers (APPs -  Physician Assistants and Nurse Practitioners) who all work together to provide you with the care you need, when you need it.  We recommend signing up for the patient portal called "MyChart".  Sign up information is provided on this After Visit Summary.  MyChart is used to connect with patients for Virtual Visits (Telemedicine).  Patients are able to view lab/test results, encounter notes, upcoming appointments, etc.  Non-urgent messages can be sent to your provider as well.   To learn more about what you can do with  MyChart, go to [email or web address].    Your next appointment:   6 week(s)  The format for your next appointment:   In Person  Provider:   Virl Axe, MD   Other Instructions n/a

## 2020-09-07 NOTE — Progress Notes (Signed)
Patient Care Team: Adin Hector, MD as PCP - General (Internal Medicine) Deboraha Sprang, MD as PCP - Cardiology (Cardiology) Deboraha Sprang, MD as PCP - Electrophysiology (Cardiology)   HPI  Erin Yates is a 74 y.o. female Seen in the past by Dr. Carlyn Reichert because of palpitations in the context of prior closure of the PFO and cryptogenic stroke.  TEE 4/17 was normal with Amplatzer closure device in good position    Device >>SCAF >> .apixoban issue of compliance related to cost--but continuing   She has had now 7 strokes. She has lost vision with one of the more recent strokes   She had bradycardia Dr. Caryn Section decreased her metoprolol from 200--100->>50 better  At last visit had recommended we consider discontinuing the beta-blocker altogether; family was reluctant but we ended up decreasing 50--25.  She continues to complain of lethargy.  When she walks her heart rate she notes: Goes into about the 60s.  No chest pain.  No edema.  Still smoking.  She also describes episodes occurring once or twice a month lasting up to a day of her heart rate going to 190s.  She calls this "atrial fibrillation "  Long standing hypertension   Date Cr K Hgb  5/19.  1.1 5.2   1/20 1/4 4.8   3/21 1.59 4.6 11.3   DATE TEST EF   4/17 Echo   55-60 %   1/21 Echo  55-60 % "ill defined density post leaflet"  4/21 TEE 60-65 Prolapsed P3 leaflet/ MR mod         Past Medical History:  Diagnosis Date  . Anemia    past  hx  . Anxiety   . Depression   . Diabetes mellitus without complication ([place])   . Dysrhythmia    prior to PFO correction  . GERD (gastroesophageal reflux disease)   . Headache    before PFO closure  . Hypercholesteremia   . Hypertension   . Hypothyroidism   . 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 PFO (patent foramen ovale)    correction device placed 2010  . Right leg numbness    lower leg- residual from CVA  . Seasonal allergies   . Stroke [facility])     prior to PFO correction    Past Surgical History:  Procedure Laterality Date  . ABDOMINAL HYSTERECTOMY    . BUBBLE STUDY  02/06/2020   Procedure: BUBBLE STUDY;  Surgeon: Sanda Caylyn Tedeschi, MD;  Location: [place];  Service: Cardiovascular;;  . [place]  . CATARACT EXTRACTION W/PHACO Right 09/01/2015   Procedure: CATARACT EXTRACTION PHACO AND INTRAOCULAR LENS PLACEMENT (IOC);  Surgeon: Leandrew Koyanagi, MD;  Location: [place];  Service: Ophthalmology;  Laterality: Right;  DIABETIC - oral meds  . CATARACT EXTRACTION W/PHACO Left 10/20/2015   Procedure: CATARACT EXTRACTION PHACO AND INTRAOCULAR LENS PLACEMENT (IOC);  Surgeon: Leandrew Koyanagi, MD;  Location: [place];  Service: Ophthalmology;  Laterality: Left;  DIABETIC - oral meds  . EP IMPLANTABLE DEVICE N/A 04/04/2016   Procedure: Loop Recorder Insertion;  Surgeon: Deboraha Sprang, MD;  Location: [place] [facility];  Service: Cardiovascular;  Laterality: N/A;  . FOOT NEUROMA SURGERY Left   . NASAL SINUS SURGERY    . PATENT FORAMEN OVALE CLOSURE    . PATENT FORAMEN OVALE CLOSURE    . TEE WITHOUT CARDIOVERSION N/A 02/09/2016   Procedure:  TRANSESOPHAGEAL ECHOCARDIOGRAM (TEE);  Surgeon: Adrian Prows, MD;  Location: [place];  Service: Cardiovascular;  Laterality: N/A;  . TEE WITHOUT CARDIOVERSION N/A 11/12/2019   Procedure: TRANSESOPHAGEAL ECHOCARDIOGRAM (TEE);  Surgeon: Minna Merritts, MD;  Location: [facility];  Service: Cardiovascular;  Laterality: N/A;  . TEE WITHOUT CARDIOVERSION N/A 02/06/2020   Procedure: TRANSESOPHAGEAL ECHOCARDIOGRAM (TEE);  Surgeon: Sanda , MD;  Location: [facility];  Service: Cardiovascular;  Laterality: N/A;    Current Outpatient Medications  Medication Sig Dispense Refill  . ALPRAZolam (XANAX) 0.25 MG tablet Take 0.25 mg by mouth 2 (two) times daily as needed for anxiety.    Marland Kitchen amLODipine (NORVASC) 10 MG tablet Take 10 mg by mouth every evening.      Marland Kitchen atorvastatin (LIPITOR) 80 MG tablet Take 80 mg by mouth daily.    Marland Kitchen buPROPion (WELLBUTRIN SR) 150 MG 12 hr tablet Take 1 tablet (150 mg total) by mouth 2 (two) times daily. 60 tablet 5  . cholecalciferol (VITAMIN D3) 25 MCG (1000 UT) tablet Take 1,000 Units by mouth daily.    Marland Kitchen desvenlafaxine (PRISTIQ) 100 MG 24 hr tablet Take 100 mg by mouth daily.    Marland Kitchen ELIQUIS 5 MG TABS tablet TAKE 1 TABLET (5 MG TOTAL) BY MOUTH 2 (TWO) TIMES DAILY. 180 tablet 1  . ferrous sulfate 325 (65 FE) MG tablet Take 325 mg by mouth every evening.    . fluticasone (FLONASE) 50 MCG/ACT nasal spray Place into both nostrils daily as needed for allergies or rhinitis.    . hydrochlorothiazide (MICROZIDE) 12.5 MG capsule Take 12.5 mg by mouth daily as needed (fluid retention (feet/leg swelling)).     Marland Kitchen levothyroxine (SYNTHROID, LEVOTHROID) 50 MCG tablet Take 50 mcg by mouth daily before breakfast.    . losartan (COZAAR) 100 MG tablet Take 100 mg by mouth daily.     . metFORMIN (GLUCOPHAGE) 1000 MG tablet Take 1,000 mg by mouth 2 (two) times daily with a meal.    . metoprolol succinate (TOPROL-XL) 25 MG 24 hr tablet Take 1 tablet (25 mg total) by mouth daily. 90 tablet 3  . pantoprazole (PROTONIX) 40 MG tablet Take 40 mg by mouth 2 (two) times daily. Reported on 04/17/2016    . traZODone (DESYREL) 50 MG tablet Take 50 mg by mouth at bedtime as needed.     . vitamin B-12 (CYANOCOBALAMIN) 500 MCG tablet Take 500 mcg by mouth every evening.      No current facility-administered medications for this visit.    Allergies  Allergen Reactions  . Alendronate Other (See Comments)    Rash   . Penicillins Rash    Also swelling and itching  Did it involve swelling of the face/tongue/throat, SOB, or low BP? Yes Did it involve sudden or severe rash/hives, skin peeling, or any reaction on the inside of your mouth or nose? Yes Did you need to seek medical attention at a hospital or doctor's office? Yes When did it last  happen?More than 15 years ago If all above answers are "NO", may proceed with cephalosporin use.       Review of Systems negative except from HPI and PMH  Physical Exam BP (!) 142/70 (BP Location: Left Arm, Patient Position: Sitting, Cuff Size: Normal)   Pulse (!) 56   Ht 5\' 5"  (1.651 m)   Wt 131 lb (59.4 kg)   SpO2 96%   BMI 21.80 kg/m  Well developed and nourished in no acute distress HENT normal Neck supple with JVP-  flat   Clear Regular rate and rhythm, no murmurs or gallops Abd-soft with active BS No Clubbing cyanosis edema Skin-warm and dry A & Oriented  Grossly normal sensory and motor function  ECG sinus rhythm at 56 Intervals 23/08/43  Assessment and  Plan\ Cryptogenic stroke  Hypertension  Subclinical atrial fibrillation  Sinus bradycardia-symptomatic  PFO-Amplatzer closure  Mitral valve lesion-calcified  Tachypalpitations  Tobacco abuse  She remains bradycardic.  I suspect that she is still limited by her bradycardia.  She and her daughter today are agreeable to discontinuing the metoprolol.   We use a monitor to clarify the mechanism of her tachypalpitations.  She is known to have SCAF; is not known to have atrial fibrillation.  We discussed the potential implications of tachybradycardia syndrome on need for medical therapy prompting need for pacing and/or consideration of catheter ablation of the tachycardia mechanism.  Blood pressure is reasonably controlled for now.  She continues to smoke.  And lost bottle.

## 2020-09-20 ENCOUNTER — Telehealth: Payer: Self-pay | Admitting: Internal Medicine

## 2020-09-20 DIAGNOSIS — R002 Palpitations: Secondary | ICD-10-CM

## 2020-09-20 NOTE — Telephone Encounter (Signed)
Reviewed Zio website this morning. I do not see that her 2nd monitor has been shipped yet.  I did attach this request to her original enrollment. I have gone back in the [place] website and re-ordered a 2nd monitor be shipped to the patient today.  I have called and spoken with the patient's daughter, Erin Yates, and notified her I have re-ordered the 2nd monitor and to be expecting this in the next 24-48 hours.  Per Erin Yates, the patient is due to take her 1st monitor off tomorrow. I have advised she may go ahead and take this off and send it back as the 2nd monitor should be there hopefully the day after tomorrow.  Erin Yates voices understanding and is agreeable.

## 2020-09-20 NOTE — Telephone Encounter (Signed)
Patient daughter Roselyn Reef calling  States they have not received second monitor in the mail and are suppose to switch out monitors tomorrow  Would like to know what to do if monitor is not received  Please call Roselyn Reef at [phone]

## 2020-09-28 ENCOUNTER — Telehealth: Payer: Self-pay | Admitting: *Deleted

## 2020-09-28 NOTE — Telephone Encounter (Signed)
Contacted in attempt to reschedule lung screening scan. Patient reports she is too busy with other appointments. Is agreeable to consider scheduling something in January but wants to be contacted closer to that time.

## 2020-09-29 DIAGNOSIS — R002 Palpitations: Secondary | ICD-10-CM | POA: Diagnosis not present

## 2020-10-02 ENCOUNTER — Inpatient Hospital Stay (HOSPITAL_COMMUNITY): Payer: Medicare HMO

## 2020-10-02 ENCOUNTER — Inpatient Hospital Stay (HOSPITAL_COMMUNITY): Payer: Medicare HMO | Admitting: Anesthesiology

## 2020-10-02 ENCOUNTER — Inpatient Hospital Stay (HOSPITAL_COMMUNITY)
Admission: EM | Admit: 2020-10-02 | Discharge: 2020-10-12 | DRG: 023 | Disposition: A | Discharge status: Home Health | Payer: Medicare HMO | Attending: Neurology | Admitting: Neurology

## 2020-10-02 ENCOUNTER — Emergency Department (HOSPITAL_COMMUNITY): Payer: Medicare HMO

## 2020-10-02 ENCOUNTER — Encounter (HOSPITAL_COMMUNITY)
Admission: EM | Disposition: A | Discharge status: Home Health | Payer: Self-pay | Source: Home / Self Care | Attending: Neurology

## 2020-10-02 ENCOUNTER — Encounter (HOSPITAL_COMMUNITY): Payer: Self-pay | Admitting: Neurology

## 2020-10-02 DIAGNOSIS — R414 Neurologic neglect syndrome: Secondary | ICD-10-CM | POA: Diagnosis present

## 2020-10-02 DIAGNOSIS — H547 Unspecified visual loss: Secondary | ICD-10-CM | POA: Diagnosis present

## 2020-10-02 DIAGNOSIS — E039 Hypothyroidism, unspecified: Secondary | ICD-10-CM | POA: Diagnosis present

## 2020-10-02 DIAGNOSIS — R29704 NIHSS score 4: Secondary | ICD-10-CM | POA: Diagnosis not present

## 2020-10-02 DIAGNOSIS — I700 Atherosclerosis of aorta: Secondary | ICD-10-CM | POA: Diagnosis present

## 2020-10-02 DIAGNOSIS — D649 Anemia, unspecified: Secondary | ICD-10-CM

## 2020-10-02 DIAGNOSIS — W1809XA Striking against other object with subsequent fall, initial encounter: Secondary | ICD-10-CM | POA: Diagnosis present

## 2020-10-02 DIAGNOSIS — R29710 NIHSS score 10: Secondary | ICD-10-CM | POA: Diagnosis not present

## 2020-10-02 DIAGNOSIS — N183 Chronic kidney disease, stage 3 unspecified: Secondary | ICD-10-CM | POA: Diagnosis present

## 2020-10-02 DIAGNOSIS — Z8673 Personal history of transient ischemic attack (TIA), and cerebral infarction without residual deficits: Secondary | ICD-10-CM | POA: Diagnosis not present

## 2020-10-02 DIAGNOSIS — Z9841 Cataract extraction status, right eye: Secondary | ICD-10-CM

## 2020-10-02 DIAGNOSIS — Y9301 Activity, walking, marching and hiking: Secondary | ICD-10-CM | POA: Diagnosis present

## 2020-10-02 DIAGNOSIS — I69391 Dysphagia following cerebral infarction: Secondary | ICD-10-CM

## 2020-10-02 DIAGNOSIS — Z20822 Contact with and (suspected) exposure to covid-19: Secondary | ICD-10-CM | POA: Diagnosis present

## 2020-10-02 DIAGNOSIS — E7800 Pure hypercholesterolemia, unspecified: Secondary | ICD-10-CM | POA: Diagnosis not present

## 2020-10-02 DIAGNOSIS — E876 Hypokalemia: Secondary | ICD-10-CM | POA: Diagnosis not present

## 2020-10-02 DIAGNOSIS — Z880 Allergy status to penicillin: Secondary | ICD-10-CM | POA: Diagnosis not present

## 2020-10-02 DIAGNOSIS — Z720 Tobacco use: Secondary | ICD-10-CM | POA: Diagnosis present

## 2020-10-02 DIAGNOSIS — R29712 NIHSS score 12: Secondary | ICD-10-CM | POA: Diagnosis not present

## 2020-10-02 DIAGNOSIS — I609 Nontraumatic subarachnoid hemorrhage, unspecified: Secondary | ICD-10-CM | POA: Diagnosis not present

## 2020-10-02 DIAGNOSIS — Y92009 Unspecified place in unspecified non-institutional (private) residence as the place of occurrence of the external cause: Secondary | ICD-10-CM

## 2020-10-02 DIAGNOSIS — R29810 Facial weakness: Secondary | ICD-10-CM | POA: Diagnosis not present

## 2020-10-02 DIAGNOSIS — I6601 Occlusion and stenosis of right middle cerebral artery: Secondary | ICD-10-CM | POA: Diagnosis not present

## 2020-10-02 DIAGNOSIS — Z9842 Cataract extraction status, left eye: Secondary | ICD-10-CM

## 2020-10-02 DIAGNOSIS — I69398 Other sequelae of cerebral infarction: Secondary | ICD-10-CM

## 2020-10-02 DIAGNOSIS — N1831 Chronic kidney disease, stage 3a: Secondary | ICD-10-CM | POA: Diagnosis present

## 2020-10-02 DIAGNOSIS — S066X0A Traumatic subarachnoid hemorrhage without loss of consciousness, initial encounter: Secondary | ICD-10-CM | POA: Diagnosis not present

## 2020-10-02 DIAGNOSIS — H53469 Homonymous bilateral field defects, unspecified side: Secondary | ICD-10-CM | POA: Diagnosis present

## 2020-10-02 DIAGNOSIS — Z888 Allergy status to other drugs, medicaments and biological substances status: Secondary | ICD-10-CM | POA: Diagnosis not present

## 2020-10-02 DIAGNOSIS — F419 Anxiety disorder, unspecified: Secondary | ICD-10-CM | POA: Diagnosis present

## 2020-10-02 DIAGNOSIS — R1312 Dysphagia, oropharyngeal phase: Secondary | ICD-10-CM | POA: Diagnosis not present

## 2020-10-02 DIAGNOSIS — E1165 Type 2 diabetes mellitus with hyperglycemia: Secondary | ICD-10-CM | POA: Diagnosis not present

## 2020-10-02 DIAGNOSIS — R404 Transient alteration of awareness: Secondary | ICD-10-CM | POA: Diagnosis not present

## 2020-10-02 DIAGNOSIS — G8194 Hemiplegia, unspecified affecting left nondominant side: Secondary | ICD-10-CM | POA: Diagnosis not present

## 2020-10-02 DIAGNOSIS — I4891 Unspecified atrial fibrillation: Secondary | ICD-10-CM | POA: Diagnosis not present

## 2020-10-02 DIAGNOSIS — I129 Hypertensive chronic kidney disease with stage 1 through stage 4 chronic kidney disease, or unspecified chronic kidney disease: Secondary | ICD-10-CM | POA: Diagnosis present

## 2020-10-02 DIAGNOSIS — R778 Other specified abnormalities of plasma proteins: Secondary | ICD-10-CM

## 2020-10-02 DIAGNOSIS — Z79899 Other long term (current) drug therapy: Secondary | ICD-10-CM

## 2020-10-02 DIAGNOSIS — R29720 NIHSS score 20: Secondary | ICD-10-CM | POA: Diagnosis not present

## 2020-10-02 DIAGNOSIS — F17200 Nicotine dependence, unspecified, uncomplicated: Secondary | ICD-10-CM | POA: Diagnosis present

## 2020-10-02 DIAGNOSIS — I63511 Cerebral infarction due to unspecified occlusion or stenosis of right middle cerebral artery: Principal | ICD-10-CM | POA: Diagnosis present

## 2020-10-02 DIAGNOSIS — R1310 Dysphagia, unspecified: Secondary | ICD-10-CM | POA: Diagnosis not present

## 2020-10-02 DIAGNOSIS — R29708 NIHSS score 8: Secondary | ICD-10-CM | POA: Diagnosis not present

## 2020-10-02 DIAGNOSIS — I10 Essential (primary) hypertension: Secondary | ICD-10-CM | POA: Diagnosis not present

## 2020-10-02 DIAGNOSIS — I6389 Other cerebral infarction: Secondary | ICD-10-CM | POA: Diagnosis not present

## 2020-10-02 DIAGNOSIS — F40240 Claustrophobia: Secondary | ICD-10-CM | POA: Diagnosis present

## 2020-10-02 DIAGNOSIS — Z781 Physical restraint status: Secondary | ICD-10-CM

## 2020-10-02 DIAGNOSIS — Z7901 Long term (current) use of anticoagulants: Secondary | ICD-10-CM

## 2020-10-02 DIAGNOSIS — Z8774 Personal history of (corrected) congenital malformations of heart and circulatory system: Secondary | ICD-10-CM

## 2020-10-02 DIAGNOSIS — R7989 Other specified abnormal findings of blood chemistry: Secondary | ICD-10-CM

## 2020-10-02 DIAGNOSIS — E785 Hyperlipidemia, unspecified: Secondary | ICD-10-CM | POA: Diagnosis present

## 2020-10-02 DIAGNOSIS — N1832 Chronic kidney disease, stage 3b: Secondary | ICD-10-CM | POA: Diagnosis present

## 2020-10-02 DIAGNOSIS — I471 Supraventricular tachycardia: Secondary | ICD-10-CM | POA: Diagnosis present

## 2020-10-02 DIAGNOSIS — R001 Bradycardia, unspecified: Secondary | ICD-10-CM | POA: Diagnosis not present

## 2020-10-02 DIAGNOSIS — Z961 Presence of intraocular lens: Secondary | ICD-10-CM | POA: Diagnosis present

## 2020-10-02 DIAGNOSIS — I639 Cerebral infarction, unspecified: Secondary | ICD-10-CM | POA: Diagnosis present

## 2020-10-02 DIAGNOSIS — I480 Paroxysmal atrial fibrillation: Secondary | ICD-10-CM | POA: Diagnosis not present

## 2020-10-02 DIAGNOSIS — R4182 Altered mental status, unspecified: Secondary | ICD-10-CM | POA: Diagnosis not present

## 2020-10-02 DIAGNOSIS — E1122 Type 2 diabetes mellitus with diabetic chronic kidney disease: Secondary | ICD-10-CM | POA: Diagnosis present

## 2020-10-02 DIAGNOSIS — Z794 Long term (current) use of insulin: Secondary | ICD-10-CM | POA: Diagnosis not present

## 2020-10-02 DIAGNOSIS — N179 Acute kidney failure, unspecified: Secondary | ICD-10-CM | POA: Diagnosis present

## 2020-10-02 DIAGNOSIS — Z7984 Long term (current) use of oral hypoglycemic drugs: Secondary | ICD-10-CM

## 2020-10-02 DIAGNOSIS — Z79890 Hormone replacement therapy (postmenopausal): Secondary | ICD-10-CM

## 2020-10-02 DIAGNOSIS — R451 Restlessness and agitation: Secondary | ICD-10-CM | POA: Diagnosis not present

## 2020-10-02 DIAGNOSIS — E119 Type 2 diabetes mellitus without complications: Secondary | ICD-10-CM | POA: Diagnosis present

## 2020-10-02 DIAGNOSIS — R0902 Hypoxemia: Secondary | ICD-10-CM | POA: Diagnosis not present

## 2020-10-02 DIAGNOSIS — I63411 Cerebral infarction due to embolism of right middle cerebral artery: Secondary | ICD-10-CM | POA: Diagnosis not present

## 2020-10-02 DIAGNOSIS — Z90710 Acquired absence of both cervix and uterus: Secondary | ICD-10-CM

## 2020-10-02 DIAGNOSIS — R410 Disorientation, unspecified: Secondary | ICD-10-CM | POA: Diagnosis present

## 2020-10-02 DIAGNOSIS — R0689 Other abnormalities of breathing: Secondary | ICD-10-CM | POA: Diagnosis not present

## 2020-10-02 DIAGNOSIS — G936 Cerebral edema: Secondary | ICD-10-CM | POA: Diagnosis not present

## 2020-10-02 DIAGNOSIS — F32A Depression, unspecified: Secondary | ICD-10-CM | POA: Diagnosis present

## 2020-10-02 DIAGNOSIS — I63412 Cerebral infarction due to embolism of left middle cerebral artery: Secondary | ICD-10-CM | POA: Diagnosis not present

## 2020-10-02 DIAGNOSIS — Z9889 Other specified postprocedural states: Secondary | ICD-10-CM | POA: Diagnosis not present

## 2020-10-02 DIAGNOSIS — K219 Gastro-esophageal reflux disease without esophagitis: Secondary | ICD-10-CM | POA: Diagnosis present

## 2020-10-02 DIAGNOSIS — I4821 Permanent atrial fibrillation: Secondary | ICD-10-CM | POA: Diagnosis not present

## 2020-10-02 DIAGNOSIS — I495 Sick sinus syndrome: Secondary | ICD-10-CM | POA: Diagnosis present

## 2020-10-02 DIAGNOSIS — H3560 Retinal hemorrhage, unspecified eye: Secondary | ICD-10-CM | POA: Diagnosis present

## 2020-10-02 DIAGNOSIS — R29818 Other symptoms and signs involving the nervous system: Secondary | ICD-10-CM | POA: Diagnosis not present

## 2020-10-02 HISTORY — PX: IR US GUIDE VASC ACCESS RIGHT: IMG2390

## 2020-10-02 HISTORY — PX: IR CT HEAD LTD: IMG2386

## 2020-10-02 HISTORY — PX: RADIOLOGY WITH ANESTHESIA: SHX6223

## 2020-10-02 HISTORY — PX: IR PERCUTANEOUS ART THROMBECTOMY/INFUSION INTRACRANIAL INC DIAG ANGIO: IMG6087

## 2020-10-02 LAB — COMPREHENSIVE METABOLIC PANEL
ALT: 15 U/L (ref 0–44)
AST: 18 U/L (ref 15–41)
Albumin: 3.6 g/dL (ref 3.5–5.0)
Alkaline Phosphatase: 70 U/L (ref 38–126)
Anion gap: 13 (ref 5–15)
BUN: 18 mg/dL (ref 8–23)
CO2: 15 mmol/L — ABNORMAL LOW (ref 22–32)
Calcium: 9.7 mg/dL (ref 8.9–10.3)
Chloride: 108 mmol/L (ref 98–111)
Creatinine, Ser: 1.6 mg/dL — ABNORMAL HIGH (ref 0.44–1.00)
GFR, Estimated: 34 mL/min — ABNORMAL LOW (ref >60)
Glucose, Bld: 178 mg/dL — ABNORMAL HIGH (ref 70–99)
Potassium: 4.4 mmol/L (ref 3.5–5.1)
Sodium: 136 mmol/L (ref 135–145)
Total Bilirubin: 0.3 mg/dL (ref 0.3–1.2)
Total Protein: 6.9 g/dL (ref 6.5–8.1)

## 2020-10-02 LAB — CBC
HCT: 36.6 % (ref 36.0–46.0)
Hemoglobin: 11.7 g/dL — ABNORMAL LOW (ref 12.0–15.0)
MCH: 31.9 pg (ref 26.0–34.0)
MCHC: 32 g/dL (ref 30.0–36.0)
MCV: 99.7 fL (ref 80.0–100.0)
Platelets: 227 10*3/uL (ref 150–400)
RBC: 3.67 MIL/uL — ABNORMAL LOW (ref 3.87–5.11)
RDW: 12.9 % (ref 11.5–15.5)
WBC: 9.6 10*3/uL (ref 4.0–10.5)
nRBC: 0 % (ref 0.0–0.2)

## 2020-10-02 LAB — RESP PANEL BY RT-PCR (FLU A&B, COVID) ARPGX2
Influenza A by PCR: NEGATIVE
Influenza B by PCR: NEGATIVE
SARS Coronavirus 2 by RT PCR: NEGATIVE

## 2020-10-02 LAB — DIFFERENTIAL
Abs Immature Granulocytes: 0.02 10*3/uL (ref 0.00–0.07)
Basophils Absolute: 0.1 10*3/uL (ref 0.0–0.1)
Basophils Relative: 1 %
Eosinophils Absolute: 0.1 10*3/uL (ref 0.0–0.5)
Eosinophils Relative: 1 %
Immature Granulocytes: 0 %
Lymphocytes Relative: 30 %
Lymphs Abs: 2.9 10*3/uL (ref 0.7–4.0)
Monocytes Absolute: 0.6 10*3/uL (ref 0.1–1.0)
Monocytes Relative: 6 %
Neutro Abs: 6 10*3/uL (ref 1.7–7.7)
Neutrophils Relative %: 62 %

## 2020-10-02 LAB — SURGICAL PCR SCREEN
MRSA, PCR: POSITIVE — AB
Staphylococcus aureus: POSITIVE — AB

## 2020-10-02 LAB — I-STAT CHEM 8, ED
BUN: 19 mg/dL (ref 8–23)
Calcium, Ion: 1.18 mmol/L (ref 1.15–1.40)
Chloride: 108 mmol/L (ref 98–111)
Creatinine, Ser: 1.3 mg/dL — ABNORMAL HIGH (ref 0.44–1.00)
Glucose, Bld: 174 mg/dL — ABNORMAL HIGH (ref 70–99)
HCT: 38 % (ref 36.0–46.0)
Hemoglobin: 12.9 g/dL (ref 12.0–15.0)
Potassium: 4.3 mmol/L (ref 3.5–5.1)
Sodium: 139 mmol/L (ref 135–145)
TCO2: 15 mmol/L — ABNORMAL LOW (ref 22–32)

## 2020-10-02 LAB — PROTIME-INR
INR: 0.9 (ref 0.8–1.2)
Prothrombin Time: 12.2 seconds (ref 11.4–15.2)

## 2020-10-02 LAB — CBG MONITORING, ED: Glucose-Capillary: 179 mg/dL — ABNORMAL HIGH (ref 70–99)

## 2020-10-02 LAB — APTT: aPTT: 26 seconds (ref 24–36)

## 2020-10-02 SURGERY — IR WITH ANESTHESIA
Anesthesia: General

## 2020-10-02 MED ORDER — VERAPAMIL HCL 2.5 MG/ML IV SOLN
INTRAVENOUS | Status: AC
  Filled 2020-10-02: qty 2

## 2020-10-02 MED ORDER — ASPIRIN 81 MG PO CHEW
CHEWABLE_TABLET | ORAL | Status: AC
  Filled 2020-10-02: qty 1

## 2020-10-02 MED ORDER — SODIUM CHLORIDE 0.9 % IV SOLN: INTRAVENOUS | Status: DC | PRN

## 2020-10-02 MED ORDER — SUGAMMADEX SODIUM 200 MG/2ML IV SOLN
INTRAVENOUS | Status: DC | PRN
  Administered 2020-10-02: 200 mg via INTRAVENOUS

## 2020-10-02 MED ORDER — PROPOFOL 10 MG/ML IV BOLUS
INTRAVENOUS | Status: DC | PRN
  Administered 2020-10-02: 40 mg via INTRAVENOUS
  Administered 2020-10-02: 200 mg via INTRAVENOUS

## 2020-10-02 MED ORDER — ACETAMINOPHEN 160 MG/5ML PO SOLN: 650.0000 mg | ORAL | Status: DC | PRN

## 2020-10-02 MED ORDER — CLEVIDIPINE BUTYRATE 0.5 MG/ML IV EMUL
INTRAVENOUS | Status: DC | PRN
  Administered 2020-10-02: 4 mg/h via INTRAVENOUS

## 2020-10-02 MED ORDER — ESMOLOL HCL 100 MG/10ML IV SOLN
INTRAVENOUS | Status: DC | PRN
  Administered 2020-10-02 (×2): 20 mg via INTRAVENOUS

## 2020-10-02 MED ORDER — ROCURONIUM BROMIDE 100 MG/10ML IV SOLN
INTRAVENOUS | Status: DC | PRN
  Administered 2020-10-02: 50 mg via INTRAVENOUS

## 2020-10-02 MED ORDER — EPTIFIBATIDE 20 MG/10ML IV SOLN
INTRAVENOUS | Status: AC
  Filled 2020-10-02: qty 10

## 2020-10-02 MED ORDER — CLOPIDOGREL BISULFATE 300 MG PO TABS
ORAL_TABLET | ORAL | Status: AC
  Filled 2020-10-02: qty 1

## 2020-10-02 MED ORDER — STROKE: EARLY STAGES OF RECOVERY BOOK
Freq: Once | Status: DC
  Filled 2020-10-02: qty 1

## 2020-10-02 MED ORDER — CLEVIDIPINE BUTYRATE 0.5 MG/ML IV EMUL
INTRAVENOUS | Status: AC
  Filled 2020-10-02: qty 50

## 2020-10-02 MED ORDER — IOHEXOL 300 MG/ML  SOLN
50.0000 mL | Freq: Once | INTRAMUSCULAR | Status: AC | PRN
  Administered 2020-10-02: 40 mL via INTRA_ARTERIAL

## 2020-10-02 MED ORDER — IOHEXOL 350 MG/ML SOLN
100.0000 mL | Freq: Once | INTRAVENOUS | Status: AC | PRN
  Administered 2020-10-02: 100 mL via INTRAVENOUS

## 2020-10-02 MED ORDER — ACETAMINOPHEN 325 MG PO TABS
650.0000 mg | ORAL_TABLET | ORAL | Status: DC | PRN
  Administered 2020-10-04 – 2020-10-11 (×13): 650 mg via ORAL
  Filled 2020-10-02 (×14): qty 2

## 2020-10-02 MED ORDER — SODIUM CHLORIDE 0.9 % IV SOLN: INTRAVENOUS | Status: DC

## 2020-10-02 MED ORDER — LIDOCAINE HCL (CARDIAC) PF 100 MG/5ML IV SOSY
PREFILLED_SYRINGE | INTRAVENOUS | Status: DC | PRN
  Administered 2020-10-02: 60 mg via INTRATRACHEAL

## 2020-10-02 MED ORDER — TIROFIBAN HCL IN NACL 5-0.9 MG/100ML-% IV SOLN
INTRAVENOUS | Status: AC
  Filled 2020-10-02: qty 100

## 2020-10-02 MED ORDER — NITROGLYCERIN 1 MG/10 ML FOR IR/CATH LAB
INTRA_ARTERIAL | Status: AC
  Filled 2020-10-02: qty 10

## 2020-10-02 MED ORDER — ACETAMINOPHEN 650 MG RE SUPP
650.0000 mg | RECTAL | Status: DC | PRN
  Administered 2020-10-03 – 2020-10-04 (×2): 650 mg via RECTAL
  Filled 2020-10-02 (×2): qty 1

## 2020-10-02 MED ORDER — CLEVIDIPINE BUTYRATE 0.5 MG/ML IV EMUL
0.0000 mg/h | INTRAVENOUS | Status: DC
  Administered 2020-10-02: 6 mg/h via INTRAVENOUS
  Administered 2020-10-03: 2 mg/h via INTRAVENOUS
  Administered 2020-10-04: 8 mg/h via INTRAVENOUS
  Filled 2020-10-02 (×3): qty 50

## 2020-10-02 MED ORDER — IOHEXOL 240 MG/ML SOLN
INTRAMUSCULAR | Status: AC
  Filled 2020-10-02: qty 200

## 2020-10-02 MED ORDER — CANGRELOR TETRASODIUM 50 MG IV SOLR
INTRAVENOUS | Status: AC
  Filled 2020-10-02: qty 50

## 2020-10-02 MED ORDER — IOHEXOL 300 MG/ML  SOLN
150.0000 mL | Freq: Once | INTRAMUSCULAR | Status: AC | PRN
  Administered 2020-10-02: 70 mL via INTRA_ARTERIAL

## 2020-10-02 MED ORDER — SUCCINYLCHOLINE CHLORIDE 20 MG/ML IJ SOLN
INTRAMUSCULAR | Status: DC | PRN
  Administered 2020-10-02: 120 mg via INTRAVENOUS

## 2020-10-02 MED ORDER — TICAGRELOR 90 MG PO TABS
ORAL_TABLET | ORAL | Status: AC
  Filled 2020-10-02: qty 2

## 2020-10-02 NOTE — ED Notes (Signed)
Report received from [place], Therapist, sports.  Patient remains in IR with RR RN, Dr Leonel Ramsay, this RN and IR team.

## 2020-10-02 NOTE — ED Provider Notes (Signed)
[facility] EMERGENCY DEPARTMENT Provider Note  CSN: [id number] Arrival date & time: 10/02/20 1859    History Chief Complaint  Patient presents with  . Code Stroke    HPI  Erin Yates is a 74 y.o. 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 Paramedics report when they arrived the patient had R sided weakness, facial droop and left sided neglect. Level 5 caveat due to acuity of condition.    Past Medical History:  Diagnosis Date  . Anemia    past  hx  . Anxiety   . Depression   . Diabetes mellitus without complication ([place])   . Dysrhythmia    prior to PFO correction  . GERD (gastroesophageal reflux disease)   . Headache    before PFO closure  . Hypercholesteremia   . Hypertension   . Hypothyroidism   . 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 PFO (patent foramen ovale)    correction device placed 2010  . Right leg numbness    lower leg- residual from CVA  . Seasonal allergies   . Stroke [facility])    prior to PFO correction    Past Surgical History:  Procedure Laterality Date  . ABDOMINAL HYSTERECTOMY    . BUBBLE STUDY  02/06/2020   Procedure: BUBBLE STUDY;  Surgeon: Sanda Klein, MD;  Location: [facility] [facility] END[facility]COPY;  Service: Cardiovascular;;  . [place]  . CATARACT EXTRACTION W/PHACO Right 09/01/2015   Procedure: CATARACT EXTRACTION PHACO AND INTRAOCULAR LENS PLACEMENT (IOC);  Surgeon: Leandrew Koyanagi, MD;  Location: [place];  Service: Ophthalmology;  Laterality: Right;  DIABETIC - oral meds  . CATARACT EXTRACTION W/PHACO Left 10/20/2015   Procedure: CATARACT EXTRACTION PHACO AND INTRAOCULAR LENS PLACEMENT (IOC);  Surgeon: Leandrew Koyanagi, MD;  Location: [place];  Service: Ophthalmology;  Laterality: Left;  DIABETIC - oral meds  . EP  IMPLANTABLE DEVICE N/A 04/04/2016   Procedure: Loop Recorder Insertion;  Surgeon: Deboraha Sprang, MD;  Location: [place] CV LAB;  Service: Cardiovascular;  Laterality: N/A;  . FOOT NEUROMA SURGERY Left   . NASAL SINUS SURGERY    . PATENT FORAMEN OVALE CLOSURE    . PATENT FORAMEN OVALE CLOSURE    . TEE WITHOUT CARDIOVERSION N/A 02/09/2016   Procedure: TRANSESOPHAGEAL ECHOCARDIOGRAM (TEE);  Surgeon: Adrian Prows, MD;  Location: [place];  Service: Cardiovascular;  Laterality: N/A;  . TEE WITHOUT CARDIOVERSION N/A 11/12/2019   Procedure: TRANSESOPHAGEAL ECHOCARDIOGRAM (TEE);  Surgeon: Minna Merritts, MD;  Location: [facility] ORS;  Service: Cardiovascular;  Laterality: N/A;  . TEE WITHOUT CARDIOVERSION N/A 02/06/2020   Procedure: TRANSESOPHAGEAL ECHOCARDIOGRAM (TEE);  Surgeon: Sanda Klein, MD;  Location: [facility] ENDOSCOPY;  Service: Cardiovascular;  Laterality: N/A;    Family History  Problem Relation Age of Onset  . Dementia Father   . Breast cancer Sister 65    Social History   Tobacco Use  . Smoking status: Current Every Day Smoker    Packs/day: 1.00    Years: 55.00    Pack years: 55.00  . Smokeless tobacco: Never Used  Vaping Use  . Vaping Use: Never used  Substance Use Topics  . Alcohol use: No  . Drug use: No     Home Medications Prior to Admission medications  Medication Sig Start Date End Date Taking? Authorizing Provider  ALPRAZolam (XANAX) 0.25 MG tablet Take 0.25 mg by mouth 2 (two) times daily as needed for anxiety.    [provider]  amLODipine (NORVASC) 10 MG tablet Take 10 mg by mouth every evening.     [provider]  atorvastatin (LIPITOR) 80 MG tablet Take 80 mg by mouth daily.    [provider]  buPROPion (WELLBUTRIN SR) 150 MG 12 hr tablet Take 1 tablet (150 mg total) by mouth 2 (two) times daily. 03/27/16   Garvin Fila, MD  cholecalciferol (VITAMIN D3) 25 MCG (1000 UT) tablet Take 1,000 Units by mouth daily.    [provider]  desvenlafaxine (PRISTIQ) 100 MG 24 hr tablet Take 100 mg by mouth daily. 12/05/19   [provider]  ELIQUIS 5 MG TABS tablet TAKE 1 TABLET (5 MG TOTAL) BY MOUTH 2 (TWO) TIMES DAILY. 02/23/20   Deboraha Sprang, MD  ferrous sulfate 325 (65 FE) MG tablet Take 325 mg by mouth every evening.    [provider]  fluticasone (FLONASE) 50 MCG/ACT nasal spray Place into both nostrils daily as needed for allergies or rhinitis.    [provider]  hydrochlorothiazide (MICROZIDE) 12.5 MG capsule Take 12.5 mg by mouth daily as needed (fluid retention (feet/leg swelling)).     [provider]  levothyroxine (SYNTHROID, LEVOTHROID) 50 MCG tablet Take 50 mcg by mouth daily before breakfast.    [provider]  losartan (COZAAR) 100 MG tablet Take 100 mg by mouth daily.     [provider]  metFORMIN (GLUCOPHAGE) 1000 MG tablet Take 1,000 mg by mouth 2 (two) times daily with a meal.    [provider]  pantoprazole (PROTONIX) 40 MG tablet Take 40 mg by mouth 2 (two) times daily. Reported on 04/17/2016    [provider]  traZODone (DESYREL) 50 MG tablet Take 50 mg by mouth at bedtime as needed.     [provider]  vitamin B-12 (CYANOCOBALAMIN) 500 MCG tablet Take 500 mcg by mouth every evening.     [provider]     Allergies    Alendronate and Penicillins   Review of Systems   Review of Systems Unable to assess due to acuity of condition.   Physical Exam Ht 5\' 5"  (1.651 m)   Wt 61.1 kg   BMI 22.42 kg/m   Physical Exam Vitals and nursing note reviewed.  Constitutional:      Appearance: Normal appearance.  HENT:     Head: Normocephalic and atraumatic.     Nose: Nose normal.     Mouth/Throat:     Mouth: Mucous membranes are moist.  Eyes:     Extraocular Movements: Extraocular movements intact.     Conjunctiva/sclera: Conjunctivae normal.  Cardiovascular:     Rate and Rhythm: Normal rate.   Pulmonary:     Effort: Pulmonary effort is normal.     Breath sounds: Normal breath sounds.  Abdominal:     General: Abdomen is flat.     Palpations: Abdomen is soft.     Tenderness: There is no abdominal tenderness.  Musculoskeletal:        General: No swelling. Normal range of motion.     Cervical back: Neck supple.  Skin:    General: Skin is warm and dry.  Neurological:     Mental Status: She is alert.     Comments: R sided weakness, L sided  neglect. For full NIHSS please see Neuro notes.       ED Results / Procedures / Treatments   Labs (all labs ordered are listed, but only abnormal results are displayed) Labs Reviewed  CBC - Abnormal; Notable for the following components:      Result Value   RBC 3.67 (*)    Hemoglobin 11.7 (*)    All other components within normal limits  COMPREHENSIVE METABOLIC PANEL - Abnormal; Notable for the following components:   CO2 15 (*)    Glucose, Bld 178 (*)    Creatinine, Ser 1.60 (*)    GFR, Estimated 34 (*)    All other components within normal limits  CBG MONITORING, ED - Abnormal; Notable for the following components:   Glucose-Capillary 179 (*)    All other components within normal limits  I-STAT CHEM 8, ED - Abnormal; Notable for the following components:   Creatinine, Ser 1.30 (*)    Glucose, Bld 174 (*)    TCO2 15 (*)    All other components within normal limits  RESP PANEL BY RT-PCR (FLU A&B, COVID) ARPGX2  PROTIME-INR  APTT  DIFFERENTIAL  ETHANOL  RAPID URINE DRUG SCREEN, HOSP PERFORMED  URINALYSIS, ROUTINE W REFLEX MICROSCOPIC  HEMOGLOBIN A1C  LIPID PANEL    EKG None  Radiology CT Code Stroke CTA Head W/WO contrast  Result Date: 10/02/2020 CLINICAL DATA:  Left-sided weakness. EXAM: CT ANGIOGRAPHY HEAD AND NECK TECHNIQUE: Multidetector CT imaging of the head and neck was performed using the standard protocol during bolus administration of intravenous contrast. Multiplanar CT image reconstructions and MIPs were  obtained to evaluate the vascular anatomy. Carotid stenosis measurements (when applicable) are obtained utilizing NASCET criteria, using the distal internal carotid diameter as the denominator. CONTRAST:  148mL OMNIPAQUE IOHEXOL 350 MG/ML SOLN COMPARISON:  Head and neck MRA 11/11/2019 FINDINGS: CTA NECK FINDINGS Aortic arch: Normal variant aortic arch branching pattern with common origin of the brachiocephalic and left common carotid arteries. Mild arch atherosclerosis without arch vessel origin stenosis. Right carotid system: Patent with minimal calcified plaque in the proximal ICA. No evidence of stenosis or dissection. Tortuous proximal common carotid artery. Left carotid system: Patent with minimal soft and calcified plaque in the proximal ICA. No evidence of stenosis or dissection. Tortuous proximal common carotid artery. Vertebral arteries: Patent with mild scattered plaque bilaterally. No evidence of stenosis or dissection. Slightly dominant left vertebral artery. Skeleton: Focally advanced disc degeneration at C4-5. Other neck: No evidence of cervical lymphadenopathy or mass. Upper chest: Mild centrilobular emphysema. Review of the MIP images confirms the above findings CTA HEAD FINDINGS Anterior circulation: The internal carotid arteries are patent from skull base to carotid termini with mild calcified plaque bilaterally not resulting in a significant stenosis. The right M1 segment is widely patent, however there is occlusion of the right M2 inferior division less than 1 cm beyond the MCA bifurcation. The left MCA and both ACAs are patent. There is an unchanged severe proximal left A1 stenosis. No aneurysm is identified. Posterior circulation: The intracranial vertebral arteries are widely patent to the basilar. Patent right PICA, left AICA, and bilateral SCA origins are identified. The basilar artery is widely patent. Posterior communicating arteries are diminutive or absent. The PCAs are patent without  evidence of a significant proximal stenosis on the left. There is an unchanged moderate proximal right P2 stenosis. No aneurysm is identified. Venous sinuses: Not well evaluated due to arterial contrast timing. Anatomic variants: None. Review of the MIP  images confirms the above findings IMPRESSION: 1. Emergent proximal right M2 occlusion. 2. Mild carotid and vertebral atherosclerosis without significant stenosis. 3.  Aortic Atherosclerosis (ICD10-I70.0). These results were communicated to Dr. Leonel Ramsay at 7:23 pm on 10/02/2020 by text page via the [facility] messaging system. Electronically Signed   By: Logan Bores M.D.   On: 10/02/2020 19:44   CT Code Stroke CTA Neck W/WO contrast  Result Date: 10/02/2020 CLINICAL DATA:  Left-sided weakness. EXAM: CT ANGIOGRAPHY HEAD AND NECK TECHNIQUE: Multidetector CT imaging of the head and neck was performed using the standard protocol during bolus administration of intravenous contrast. Multiplanar CT image reconstructions and MIPs were obtained to evaluate the vascular anatomy. Carotid stenosis measurements (when applicable) are obtained utilizing NASCET criteria, using the distal internal carotid diameter as the denominator. CONTRAST:  171mL OMNIPAQUE IOHEXOL 350 MG/ML SOLN COMPARISON:  Head and neck MRA 11/11/2019 FINDINGS: CTA NECK FINDINGS Aortic arch: Normal variant aortic arch branching pattern with common origin of the brachiocephalic and left common carotid arteries. Mild arch atherosclerosis without arch vessel origin stenosis. Right carotid system: Patent with minimal calcified plaque in the proximal ICA. No evidence of stenosis or dissection. Tortuous proximal common carotid artery. Left carotid system: Patent with minimal soft and calcified plaque in the proximal ICA. No evidence of stenosis or dissection. Tortuous proximal common carotid artery. Vertebral arteries: Patent with mild scattered plaque bilaterally. No evidence of stenosis or dissection. Slightly  dominant left vertebral artery. Skeleton: Focally advanced disc degeneration at C4-5. Other neck: No evidence of cervical lymphadenopathy or mass. Upper chest: Mild centrilobular emphysema. Review of the MIP images confirms the above findings CTA HEAD FINDINGS Anterior circulation: The internal carotid arteries are patent from skull base to carotid termini with mild calcified plaque bilaterally not resulting in a significant stenosis. The right M1 segment is widely patent, however there is occlusion of the right M2 inferior division less than 1 cm beyond the MCA bifurcation. The left MCA and both ACAs are patent. There is an unchanged severe proximal left A1 stenosis. No aneurysm is identified. Posterior circulation: The intracranial vertebral arteries are widely patent to the basilar. Patent right PICA, left AICA, and bilateral SCA origins are identified. The basilar artery is widely patent. Posterior communicating arteries are diminutive or absent. The PCAs are patent without evidence of a significant proximal stenosis on the left. There is an unchanged moderate proximal right P2 stenosis. No aneurysm is identified. Venous sinuses: Not well evaluated due to arterial contrast timing. Anatomic variants: None. Review of the MIP images confirms the above findings IMPRESSION: 1. Emergent proximal right M2 occlusion. 2. Mild carotid and vertebral atherosclerosis without significant stenosis. 3.  Aortic Atherosclerosis (ICD10-I70.0). These results were communicated to Dr. Leonel Ramsay at 7:23 pm on 10/02/2020 by text page via the [facility] messaging system. Electronically Signed   By: Logan Bores M.D.   On: 10/02/2020 19:44   CT HEAD CODE STROKE WO CONTRAST  Result Date: 10/02/2020 CLINICAL DATA:  Code stroke.  Left-sided weakness. EXAM: CT HEAD WITHOUT CONTRAST TECHNIQUE: Contiguous axial images were obtained from the base of the skull through the vertex without intravenous contrast. COMPARISON:  04/02/2020 FINDINGS:  Brain: There is no evidence of an acute infarct, intracranial hemorrhage, mass, midline shift, or extra-axial fluid collection. 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 Chronic lacunar infarcts in the right corona radiata and cerebellum are also unchanged. The ventricles are normal in size. Vascular: Calcified atherosclerosis at the skull base. Hyperdense right  MCA in the region of the bifurcation. Skull: No fracture or suspicious osseous lesion. Sinuses/Orbits: Prior left-sided functional endoscopic sinus surgery. Paranasal sinuses and mastoid air cells are clear. Bilateral cataract extraction. Rightward gaze. Other: None. ASPECTS [facility] Stroke Program Early CT Score) - Ganglionic level infarction (caudate, lentiform nuclei, internal capsule, insula, M1-M3 cortex): 7 - Supraganglionic infarction (M4-M6 cortex): 3 Total score (0-10 with 10 being normal): 10 IMPRESSION: 1. No acute infarct or intracranial hemorrhage identified. 2. ASPECTS is 10. 3. Hyperdense right MCA which will be evaluated on pending CTA. 4. Multiple chronic infarcts. These results were communicated to Dr. Leonel Ramsay at 7:23 pm on 10/02/2020 by text page via the [facility] messaging system. Electronically Signed   By: Logan Bores M.D.   On: 10/02/2020 19:26    Procedures .Critical Care Performed by: Truddie Hidden, MD Authorized by: Truddie Hidden, MD   Critical care provider statement:    Critical care time (minutes):  35   Critical care was necessary to treat or prevent imminent or life-threatening deterioration of the following conditions:  CNS failure or compromise   Critical care was time spent personally by me on the following activities:  Discussions with consultants, evaluation of patient's response to treatment, examination of patient, ordering and performing treatments and interventions, ordering and review of laboratory studies, ordering and review of radiographic studies,  pulse oximetry, re-evaluation of patient's condition, obtaining history from patient or surrogate and review of old charts    Medications Ordered in the ED Medications   stroke: mapping our early stages of recovery book (has no administration in time range)  0.9 %  sodium chloride infusion (has no administration in time range)  acetaminophen (TYLENOL) tablet 650 mg (has no administration in time range)    Or  acetaminophen (TYLENOL) 160 MG/5ML solution 650 mg (has no administration in time range)    Or  acetaminophen (TYLENOL) suppository 650 mg (has no administration in time range)  iohexol (OMNIPAQUE) 350 MG/ML injection 100 mL (100 mLs Intravenous Contrast Given 10/02/20 1916)     MDM Rules/Calculators/A&P MDM  ED Course  I have reviewed the triage vital signs and the nursing notes.  Pertinent labs & imaging results that were available during my care of the patient were reviewed by me and considered in my medical decision making (see chart for details).  Clinical Course as of Oct 02 1953  Sat Oct 02, 2020  1610 I stat unremarkable. CBC with mild anemia.    [CS]  1933 Initial CT neg for bleeding. Proceeding with CTA.    [CS]  1953 CTA shows M2 occlusion, taken directly to IR for procedure. Will be admitted by Neuro.    [CS]    Clinical Course User Index [CS] Truddie Hidden, MD    Final Clinical Impression(s) / ED Diagnoses Final diagnoses:  Acute ischemic stroke [facility])    Rx / DC Orders ED Discharge Orders    None       Truddie Hidden, MD 10/02/20 1954

## 2020-10-02 NOTE — ED Triage Notes (Signed)
Pt arrived via [facility] from home for code. Pt had a mechanic fall at home and hit her head at 1800. After the fall pt had left sided neglect and slurred speech. Pt's NIH 20. Pt is NSR on monitor. Pt has heart monitor on for A-fib.  From home.

## 2020-10-02 NOTE — Procedures (Signed)
Neuro-Interventional Radiology  Post Cerebral Angiogram Procedure Note  Operator:    Dr. Earleen Newport Assistant:   None  History:   74 yo female presents to [facility] with left sided symptoms, facial droop, neglect, after a witnessed fall at home.  Imaging confirms a right MCA M2 occlusion  Baseline mRS:  1   Site of occlusion:  Right m2   Procedure: US guided right CFA access Cervical & Cerebral Angiogram Mechanical Thrombectomy Deployment of Angioseal Flat Panel CT in NIR  M2-superior division TICI 0 initial  First Pass Device:  Trevo 36mm & CAT 6 local aspiration  Result   TICI 0  Second Pass Device: Trevo 9mm & CAT 5 local aspiration  Result   TICI 0  Third Pass Device:  Solitaire 74mm & Zoom 71 local aspiration  Result   TICI 3  Findings:   Initial TICI 0, Final TICI 3  Anesthesia:   GETA  EBL:    [id number]     Complication:  Small volume SAH at the sylvian fissure and right perisylvian sulci   Medication: IV tPA administered?: no IA Medication:  no  Recommendations: - right hip straight overnight - Goal SBP 120-140.   - Frequent NV checks - Repeat CT or MRI imaging recommended within 36 hours, discretion of Neurology - NIR to follow   Signed,  Dulcy Fanny. Earleen Newport, DO

## 2020-10-02 NOTE — Progress Notes (Signed)
NeuroInterventional Radiology  Pre-Procedure Note  History: 74 yo female presents to [facility] with left sided symptoms, facial droop, neglect, after a witnessed fall at home.  Imaging confirms a right MCA M2 occlusion  Baseline mRS: 1  CT ASPECTS: 10 CTA:   Right M2 occlusion, superior division   I have discussed the case with Dr. Leonel Ramsay of Stroke Neurology.  Given the patient's symptoms, imaging findings, baseline function, we agree they are an appropriate candidate for attempt for mechanical thrombectomy.    The risks and benefits of the procedure were discussed with the  patient's family, with specific risks including: bleeding, infection, arterial injury/dissection, contrast reaction, kidney injury, need for further procedure/surgery, neurologic deficit, 10-15% risk of intracranial hemorrhage, cardiopulmonary collapse, death. All questions were answered.  The patient/family would like to proceed with attempt at thrombectomy.   Plan for cerebral angiogram and attempt at mechanical thrombectomy.   Signed,   Dulcy Fanny. Earleen Newport, DO

## 2020-10-02 NOTE — Transfer of Care (Signed)
Immediate Anesthesia Transfer of Care Note  Patient: Erin Yates  Procedure(s) Performed: IR WITH ANESTHESIA (N/A )  Patient Location: ICU  Anesthesia Type:General  Level of Consciousness: drowsy  Airway & Oxygen Therapy: Patient Spontanous Breathing and Patient connected to face mask oxygen  Post-op Assessment: Report given to RN and Post -op Vital signs reviewed and stable  Post vital signs: Reviewed and stable  Last Vitals:  Vitals Value Taken Time  BP    Temp    Pulse 90 10/02/20 2159  Resp 17 10/02/20 2159  SpO2 95 % 10/02/20 2159  Vitals shown include unvalidated device data.  Last Pain:  Vitals:   10/02/20 1932  PainSc: 10-Worst pain ever         Complications: As noted in OR record, two bottom teeth with very poor dentition fell out during emergence.  Two teeth collected and family notified of event.  Family reports bottom front teet were in very poor shape and that she had been told for several years that they needed to be removed.

## 2020-10-02 NOTE — Anesthesia Preprocedure Evaluation (Addendum)
Anesthesia Evaluation  Patient identified by MRN, date of birth, ID band Patient confused    Reviewed: Unable to perform ROS - Chart review onlyPreop documentation limited or incomplete due to emergent nature of procedure.  Airway Mallampati: III  TM Distance: >3 FB Neck ROM: Full    Dental  (+) Teeth Intact   Pulmonary Current Smoker and Patient abstained from smoking.,    breath sounds clear to auscultation       Cardiovascular hypertension, Pt. on medications and Pt. on home beta blockers + dysrhythmias  Rhythm:Regular  1. Left ventricular ejection fraction, by estimation, is 60 to 65%. The  left ventricle has normal function. The left ventricle has no regional  wall motion abnormalities. There is mild concentric left ventricular  hypertrophy. Left ventricular diastolic  parameters are consistent with Grade II diastolic dysfunction  (pseudonormalization). Elevated left atrial pressure.  2. Right ventricular systolic function is normal. The right ventricular  size is normal. Tricuspid regurgitation signal is inadequate for assessing  PA pressure.  3. Left atrial size was mild to moderately dilated. No left atrial/left  atrial appendage thrombus was detected.  4. An Amplatzer occluder device is well-seated, without residual shunt.  Agitated saline contrast bubble study was negative, with no evidence of  any interatrial shunt.  5. Severe prolapse and calcification of the medial (P3) scallop of the  posterior leaflet mitral valve leaflet(s). This protrudes into the left  atrium in both systole and diastole, creating a pseudo-mass appearance.  6. The mitral valve is myxomatous. It also has prominent degenerative  changes. Moderate mitral valve regurgitation. Mild mitral stenosis. The  mean mitral valve gradient is 3.0 mmHg with average heart rate of 52 bpm.  The regurgitant volume is 20 mL.  7. The aortic valve is normal in  structure. Aortic valve regurgitation is  not visualized. Mild aortic valve sclerosis is present, with no evidence  of aortic valve stenosis.    Neuro/Psych  Headaches, Acute stroke presentation, history of 7 strokes per record CVA    GI/Hepatic Neg liver ROS, GERD  ,  Endo/Other  diabetesHypothyroidism   Renal/GU Renal InsufficiencyRenal diseaseLab Results      Component                Value               Date                      CREATININE               1.30 (H)            10/02/2020           Lab Results      Component                Value               Date                      K                        4.3                 10/02/2020                Musculoskeletal   Abdominal   Peds  Hematology Lab Results      Component  Value               Date                      WBC                      9.6                 10/02/2020                HGB                      12.9                10/02/2020                HCT                      38.0                10/02/2020                MCV                      99.7                10/02/2020                PLT                      227                 10/02/2020           Lab Results      Component                Value               Date                      INR                      0.9                 10/02/2020                INR                      0.9                 11/10/2019                INR                      0.89                02/07/2016           eliquis   Anesthesia Other Findings   Reproductive/Obstetrics                             Anesthesia Physical Anesthesia Plan  ASA: IV and emergent  Anesthesia Plan: General   Post-op Pain Management:    Induction: Intravenous, Rapid sequence and Cricoid pressure planned  PONV Risk Score and Plan: 2 and Ondansetron and Dexamethasone  Airway Management Planned: Oral ETT  Additional Equipment: Arterial line  Intra-op  Plan:   Post-operative Plan: Possible Post-op intubation/ventilation  Informed Consent:     History available from chart only and Only emergency history available  Plan Discussed with: CRNA, Surgeon and Anesthesiologist  Anesthesia Plan Comments:        Anesthesia Quick Evaluation

## 2020-10-02 NOTE — Consult Note (Deleted)
Neurology Consultation Reason for Consult: Left sided weakness Referring Physician: Karle Starch, C  CC: Left sided weakness  History is obtained from:patient  HPI: Erin Yates is a 74 y.o. female with a history of diabetes, hypertension, atrial fibrillation as well as multiple previous strokes who presents with left-sided weakness that started abruptly around 5:45 PM.  She was walking outside when she was seen to go down to the ground.  When her husband evaluated her, she was confused and was looking to the right.  EMS was called who activated a code stroke en route.  She was taken emergently for a CT/CTA which revealed a proximal left M2 occlusion.  After discussion of risks and benefits of the procedure, family agreed and she was taken for emergent thrombectomy.  At baseline, she does have some visual difficulties due to her previous stroke, but she is able to accomplish all of her activities without assistance.  She states that she has not taken her evening dose of Eliquis, but did take her morning dose.  LKW: 5:45 PM tpa given?: no, anticoagulation Premorbid modified rankin scale: 2    ROS: A 14 point ROS was performed and is negative except as noted in the HPI.   Past Medical History:  Diagnosis Date  . Anemia    past  hx  . Anxiety   . Depression   . Diabetes mellitus without complication ([place])   . Dysrhythmia    prior to PFO correction  . GERD (gastroesophageal reflux disease)   . Headache    before PFO closure  . Hypercholesteremia   . Hypertension   . Hypothyroidism   . 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 PFO (patent foramen ovale)    correction device placed 2010  . Right leg numbness    lower leg- residual from CVA  . Seasonal allergies   . Stroke [facility])    prior to PFO correction     Family History  Problem Relation Age of Onset  . Dementia Father   . Breast cancer Sister 33     Social History:  reports that she has been smoking. She has a  55.00 pack-year smoking history. She has never used smokeless tobacco. She reports that she does not drink alcohol and does not use drugs.   Exam: Current vital signs: There were no vitals taken for this visit. Vital signs in last 24 hours:     Physical Exam  Constitutional: Appears well-developed and well-nourished.  Psych: Affect appropriate to situation Eyes: No scleral injection HENT: No OP obstrucion MSK: no joint deformities.  Cardiovascular: Normal rate and regular rhythm.  Respiratory: Effort normal, non-labored breathing GI: Soft.  No distension. There is no tenderness.  Skin: WDI  Neuro: Mental Status: Patient is awake, alert, she does not give her month or age.  She is able to answer questions and speak, but her speech is markedly delayed, appearing like an expressive aphasia. Cranial Nerves: II: She does not endorse being able to see in any visual field, however if a hand is held in front of her face, she is able to grab it, suggesting there is some vision.  Pupils are equal, round, and reactive to light.   III,IV, VI: Right gaze forced deviation V: Facial sensation is absent on the left.  VII: Facial movement with left lower facial weakness Motor: She has 2/5 weakness of the left leg and 3/5 weakness of the left arm, she is able to hold  both her right arm and leg against gravity without drift.   Sensory: She does not endorse sensation on the left  Cerebellar: No clear ataxia on the right  I have reviewed labs in epic and the results pertinent to this consultation are: Cr 1.6   I have reviewed the images obtained: CT/CTA-proximal M2 occlusion on the right  Impression: 74 year old female with likely embolic M2 occlusion.  She is undergoing mechanical thrombectomy.  I suspect that etiology is likely embolic from her atrial fibrillation.  Recommendations: - HgbA1c, fasting lipid panel - MRI, MRA  of the brain without contrast - Frequent neuro checks -  Echocardiogram - Prophylactic therapy-pending outcome of seizure - Risk factor modification - Telemetry monitoring - PT consult, OT consult, Speech consult - Stroke team to follow   This patient is critically ill and at significant risk of neurological worsening, death and care requires constant monitoring of vital signs, hemodynamics,respiratory and cardiac monitoring, neurological assessment, discussion with family, other specialists and medical decision making of high complexity. I spent 65 minutes of neurocritical care time  in the care of  this patient. This was time spent independent of any time provided by nurse practitioner or PA.  Roland Rack, MD [facility] [phone]  If 7pm- 7am, please page neurology on call as listed in [place]. 10/03/2020  12:14 AM

## 2020-10-02 NOTE — Anesthesia Procedure Notes (Signed)
Procedure Name: Intubation Date/Time: 10/02/2020 8:04 PM Performed by: Clovis Cao, CRNA Pre-anesthesia Checklist: Patient identified and Emergency Drugs available Patient Re-evaluated:Patient Re-evaluated prior to induction Oxygen Delivery Method: Ambu bag Preoxygenation: Pre-oxygenation with 100% oxygen Induction Type: IV induction, Rapid sequence and Cricoid Pressure applied Laryngoscope Size: Glidescope Grade View: Grade I Tube type: Oral Tube size: 8.0 mm Number of attempts: 1 Airway Equipment and Method: Stylet Placement Confirmation: ETT inserted through vocal cords under direct vision,  positive ETCO2 and breath sounds checked- equal and bilateral Secured at: 22 cm Tube secured with: Tape Dental Injury: Teeth and Oropharynx as per pre-operative assessment

## 2020-10-02 NOTE — Progress Notes (Signed)
Orthopedic Tech Progress Note Patient Details:  Erin Yates [birth year] [id number]  Ortho Devices Type of Ortho Device: Knee Immobilizer Ortho Device/Splint Location: rle Ortho Device/Splint Interventions: Ordered, Application, Adjustment   Post Interventions Patient Tolerated: Well Instructions Provided: Care of device, Adjustment of device   Karolee Stamps 10/02/2020, 11:37 PM

## 2020-10-02 NOTE — Anesthesia Procedure Notes (Signed)
Arterial Line Insertion Start/End11/27/2021 8:00 PM, 10/02/2020 8:14 PM Performed by: Suzy Bouchard, CRNA, CRNA  Preanesthetic checklist: patient identified, IV checked, monitors and equipment checked, pre-op evaluation and timeout performed Emergency situation Left, radial was placed Catheter size: 20 G Hand hygiene performed  and maximum sterile barriers used   Attempts: 1 Procedure performed without using ultrasound guided technique. Ultrasound Notes:anatomy identified, needle tip was noted to be adjacent to the nerve/plexus identified and no ultrasound evidence of intravascular and/or intraneural injection Following insertion, dressing applied and Biopatch. Post procedure assessment: normal  Patient tolerated the procedure well with no immediate complications.

## 2020-10-02 NOTE — Code Documentation (Addendum)
Responded to Code Stroke called at [place] for L sided weakness/neglect and slurred speech, [id number]. Pt arrived at [place]. CBG-179, NIH-20, CT head-no hemorrhage, hyperdense R MCA, CTA-proximal right M2 occlusion.  TPA not given, pt taking eliquis.  Pt transported to [facility] at [place] and prepped for IR. IR paged out at 1924.   1955-3 rings, 1 necklace, and pt's clothes given to pt's husband and son.

## 2020-10-03 ENCOUNTER — Encounter (HOSPITAL_COMMUNITY): Payer: Self-pay | Admitting: Interventional Radiology

## 2020-10-03 ENCOUNTER — Inpatient Hospital Stay (HOSPITAL_COMMUNITY): Payer: Medicare HMO

## 2020-10-03 DIAGNOSIS — E7800 Pure hypercholesterolemia, unspecified: Secondary | ICD-10-CM

## 2020-10-03 DIAGNOSIS — I480 Paroxysmal atrial fibrillation: Secondary | ICD-10-CM | POA: Diagnosis not present

## 2020-10-03 DIAGNOSIS — I63412 Cerebral infarction due to embolism of left middle cerebral artery: Secondary | ICD-10-CM

## 2020-10-03 DIAGNOSIS — R1312 Dysphagia, oropharyngeal phase: Secondary | ICD-10-CM

## 2020-10-03 DIAGNOSIS — Z8673 Personal history of transient ischemic attack (TIA), and cerebral infarction without residual deficits: Secondary | ICD-10-CM

## 2020-10-03 DIAGNOSIS — I639 Cerebral infarction, unspecified: Secondary | ICD-10-CM | POA: Diagnosis not present

## 2020-10-03 DIAGNOSIS — R451 Restlessness and agitation: Secondary | ICD-10-CM

## 2020-10-03 LAB — CBC
HCT: 28.6 % — ABNORMAL LOW (ref 36.0–46.0)
Hemoglobin: 9.7 g/dL — ABNORMAL LOW (ref 12.0–15.0)
MCH: 32.4 pg (ref 26.0–34.0)
MCHC: 33.9 g/dL (ref 30.0–36.0)
MCV: 95.7 fL (ref 80.0–100.0)
Platelets: 215 10*3/uL (ref 150–400)
RBC: 2.99 MIL/uL — ABNORMAL LOW (ref 3.87–5.11)
RDW: 12.8 % (ref 11.5–15.5)
WBC: 9.7 10*3/uL (ref 4.0–10.5)
nRBC: 0 % (ref 0.0–0.2)

## 2020-10-03 LAB — GLUCOSE, CAPILLARY
Glucose-Capillary: 109 mg/dL — ABNORMAL HIGH (ref 70–99)
Glucose-Capillary: 116 mg/dL — ABNORMAL HIGH (ref 70–99)
Glucose-Capillary: 89 mg/dL (ref 70–99)
Glucose-Capillary: 99 mg/dL (ref 70–99)

## 2020-10-03 LAB — LIPID PANEL
Cholesterol: 115 mg/dL (ref 0–200)
HDL: 35 mg/dL — ABNORMAL LOW (ref >40)
LDL Cholesterol: UNDETERMINED mg/dL (ref 0–99)
Total CHOL/HDL Ratio: 3.3 RATIO
Triglycerides: 442 mg/dL — ABNORMAL HIGH (ref <150)
VLDL: UNDETERMINED mg/dL (ref 0–40)

## 2020-10-03 LAB — BASIC METABOLIC PANEL
Anion gap: 11 (ref 5–15)
BUN: 14 mg/dL (ref 8–23)
CO2: 17 mmol/L — ABNORMAL LOW (ref 22–32)
Calcium: 8.5 mg/dL — ABNORMAL LOW (ref 8.9–10.3)
Chloride: 112 mmol/L — ABNORMAL HIGH (ref 98–111)
Creatinine, Ser: 1.46 mg/dL — ABNORMAL HIGH (ref 0.44–1.00)
GFR, Estimated: 38 mL/min — ABNORMAL LOW (ref >60)
Glucose, Bld: 85 mg/dL (ref 70–99)
Potassium: 4.2 mmol/L (ref 3.5–5.1)
Sodium: 140 mmol/L (ref 135–145)

## 2020-10-03 LAB — HEMOGLOBIN A1C
Hgb A1c MFr Bld: 6.2 % — ABNORMAL HIGH (ref 4.8–5.6)
Mean Plasma Glucose: 131.24 mg/dL

## 2020-10-03 LAB — LDL CHOLESTEROL, DIRECT: Direct LDL: 44 mg/dL (ref 0–99)

## 2020-10-03 LAB — ETHANOL: Alcohol, Ethyl (B): <10 mg/dL (ref <10)

## 2020-10-03 MED ORDER — INSULIN ASPART 100 UNIT/ML ~~LOC~~ SOLN
0.0000 [IU] | SUBCUTANEOUS | Status: DC
  Administered 2020-10-05 (×2): 1 [IU] via SUBCUTANEOUS

## 2020-10-03 MED ORDER — CHLORHEXIDINE GLUCONATE CLOTH 2 % EX PADS
6.0000 | MEDICATED_PAD | Freq: Every day | CUTANEOUS | Status: DC
  Administered 2020-10-04 – 2020-10-07 (×4): 6 via TOPICAL

## 2020-10-03 MED ORDER — LORAZEPAM 2 MG/ML IJ SOLN
2.0000 mg | Freq: Once | INTRAMUSCULAR | Status: AC
  Administered 2020-10-03: 2 mg via INTRAVENOUS
  Filled 2020-10-03: qty 1

## 2020-10-03 MED ORDER — MUPIROCIN 2 % EX OINT
1.0000 "application " | TOPICAL_OINTMENT | Freq: Two times a day (BID) | CUTANEOUS | Status: DC
  Administered 2020-10-03 – 2020-10-07 (×9): 1 via NASAL
  Filled 2020-10-03: qty 22

## 2020-10-03 MED ORDER — PANTOPRAZOLE SODIUM 40 MG IV SOLR
40.0000 mg | INTRAVENOUS | Status: DC
  Administered 2020-10-03 – 2020-10-04 (×2): 40 mg via INTRAVENOUS
  Filled 2020-10-03 (×2): qty 40

## 2020-10-03 MED ORDER — NICOTINE 14 MG/24HR TD PT24
14.0000 mg | MEDICATED_PATCH | Freq: Every day | TRANSDERMAL | Status: DC
  Administered 2020-10-03 – 2020-10-12 (×10): 14 mg via TRANSDERMAL
  Filled 2020-10-03 (×10): qty 1

## 2020-10-03 NOTE — Progress Notes (Signed)
STROKE TEAM PROGRESS NOTE   INTERVAL HISTORY Her husband and RN are at the bedside.  Pt was mildly agitated and restless, stated HA and wanted to go home. Per note, pt was confused and agitated overnight, trying to get out of bed, currently on restrain with posey belt and right wrist and ankle restrain. Still has mild weakness on the left UE and facial droop. Baseline tunnel vision. Difficulty with age and time. MRI and MRA pending.  OBJECTIVE Vitals:   10/03/20 0545 10/03/20 0600 10/03/20 0615 10/03/20 0630  BP: (!) 120/51 (!) 121/46 (!) 125/43 (!) 125/44  Pulse: 62 60 (!) 58 60  Resp: 17 16 13 16   Temp:      TempSrc:      SpO2: 97% 97% 93% 96%  Weight:      Height:        CBC:  Recent Labs  Lab 10/02/20 1903 10/02/20 1909  WBC 9.6  --   NEUTROABS 6.0  --   HGB 11.7* 12.9  HCT 36.6 38.0  MCV 99.7  --   PLT 227  --     Basic Metabolic Panel:  Recent Labs  Lab 10/02/20 1903 10/02/20 1909  NA 136 139  K 4.4 4.3  CL 108 108  CO2 15*  --   GLUCOSE 178* 174*  BUN 18 19  CREATININE 1.60* 1.30*  CALCIUM 9.7  --     Lipid Panel:     Component Value Date/Time   CHOL 115 10/03/2020 0105   TRIG 442 (H) 10/03/2020 0105   HDL 35 (L) 10/03/2020 0105   CHOLHDL 3.3 10/03/2020 0105   VLDL UNABLE TO CALCULATE IF TRIGLYCERIDE OVER 400 mg/dL 10/03/2020 0105   LDLCALC UNABLE TO CALCULATE IF TRIGLYCERIDE OVER 400 mg/dL 10/03/2020 0105   HgbA1c:  Lab Results  Component Value Date   HGBA1C 6.2 (H) 10/03/2020   Urine Drug Screen: No results found for: LABOPIA, COCAINSCRNUR, LABBENZ, AMPHETMU, THCU, LABBARB  Alcohol Level     Component Value Date/Time   ETH <10 10/03/2020 0105    IMAGING  CT Code Stroke CTA Head W/WO contrast CT Code Stroke CTA Neck W/WO contrast 10/02/2020 IMPRESSION:  1. Emergent proximal right M2 occlusion.  2. Mild carotid and vertebral atherosclerosis without significant stenosis.  3.  Aortic Atherosclerosis (ICD10-I70.0).   CT HEAD CODE  STROKE WO CONTRAST 10/02/2020 IMPRESSION:  1. No acute infarct or intracranial hemorrhage identified.  2. ASPECTS is 10.  3. Hyperdense right MCA which will be evaluated on pending CTA.  4. Multiple chronic infarcts.   MRA HEAD WO CONTRAST - pending  MRI BRAIN WO CONTRAST - pending  Transthoracic Echocardiogram  00/00/2021 Pending  ECG - pending  PHYSICAL EXAM  Temp:  [94.2 F (34.6 C)-100 F (37.8 C)] 100 F (37.8 C) (11/28 0841) Pulse Rate:  [51-112] 62 (11/28 1000) Resp:  [13-28] 18 (11/28 1000) BP: (67-152)/(33-118) 127/76 (11/28 1000) SpO2:  [90 %-100 %] 95 % (11/28 1000) Arterial Line BP: (121-169)/(43-60) 136/49 (11/27 2315) Weight:  [61.1 kg] 61.1 kg (11/27 1933)  General - Well nourished, well developed, mildly agitated and restless.  Ophthalmologic - fundi not visualized due to noncooperation.  Cardiovascular - Regular rhythm and rate with frequent PACs.  Neuro - awake, alert, mildly agitated and restless, on restrain with posey belt and Right wrist and ankle restrain. Orientated to place and people but not to age or time. Saying she is 74 years old. No aphasia but paucity of speech, not cooperative with  naming or repeating. Following simple commands but needed repeated request. B/l visual acuity decreased, able to see finger counting in the central vision, but poor visual acuity at peripheral bilaterally, but able to see hand waving on the right. No gaze palsy but right gaze preference and left hemianopia. Left facial droop, tongue protrusion not cooperative. Left UE 4-/5 with mild drift, RUE at least 4+/5. LLE 4+/5 and RLE 4/5 with just off the restrain with mild pain. Sensation, coordination not cooperative and gait not tested.   ASSESSMENT/PLAN [name] is a 74 y.o. female with history of diabetes, hypothyroidism, ongoing tobacco use, Htn, HLD, hx of PFO closure, implantable loop, hypertension, atrial fibrillation on eliquis as well as multiple  previous strokes (residual visual deficits) who presents with left-sided weakness, a fall, rightward gaze, and confusion. She did not receive IV t-PA due to anticoagulation with Eliquis. 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 However, pt does have multiple stroke risk factors  CT Head -  No acute infarct or intracranial hemorrhage identified. ASPECTS is 10. Hyperdense right MCA. Multiple chronic infarcts.   CTA H&N - Emergent proximal right M2 occlusion. Mild carotid and vertebral atherosclerosis without significant stenosis.   IR TICI3 of right M2 occlusion, Small volume SAH at the sylvian fissure and right perisylvian sulci    MRI and MRA head - pending, agitation on MRI  2D Echo - pending  Sars Corona Virus 2 - negative  TG 442, LDL - 44  HgbA1c - 6.2  VTE prophylaxis - SCDs  Eliquis (apixaban) daily prior to admission, now on No antithrombotic due to small SAH.   Ongoing aggressive stroke risk factor management  Therapy recommendations:  pending  Disposition:  Pending  Hx stroke/TIA  09/2007 with RLE hp  11/2007 with LLE numbness  01/2008 probable stroke with RUE hp  07/2010 L occipital and R basal ganglia infarct  PFO diagnosed 2009, underwent closure 04/2009 by Dr. Jenne Pane at [facility]  02/2016 left hemianopia status post TPA.  MRI showed right parietal and temporal infarcts.  CT head and neck right P3 occlusion.  EF 60 to 65%.  TEE showed PFO closure device in place.  LDL 79 A1c 6.4.  Discharged with DAPT.  11/2019 admitted for aphasia.  MRI showed left parietal lobe infarct.  MRI high-grade stenosis left A1, moderate stenosis right P2. Loop recorder positive for A. fib at that time, put on Eliquis.  Follows with Dr. Leonie Man at [facility]  PAF Tachypalpitation  Follow with Dr. [name] cardiology  Loop recorder showed subclinical A.  Fib  On Eliquis, compliant per husband  On recent cardiac event monitoring with Dr. [name]  No AC at the time until [facility] resolves and MRI showing no large stroke.  Agitation/confusion  Agitated overnight  On four-point restraint  Not able to get MRI or CT  EEG pending to rule out QT prolongation, and then consider Haldol as needed  May consider Seroquel once p.o. access  Hypertension  Home BP meds: Norvasc ; Cozaar ; HCTZ  Current BP meds: prn Cleviprex  Stable  Goal SBP 120-140 per IR . Long-term BP goal normotensive  Hyperlipidemia  Home Lipid lowering medication: Lipitor 80 mg daily   LDL 44, LDL goal < 70  Resume Lipitor once p.o. 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    Hospital day # 1  This patient is critically ill due to left MCA stroke, status post IR thrombectomy, recurrent strokes, history of stroke, atrial fibrillation, dysphagia, agitation and at significant risk of neurological worsening, death form recurrent stroke, hemorrhagic conversion, heart failure, aspiration pneumonia, sepsis, seizure. This patient's care requires constant monitoring of vital signs, hemodynamics, respiratory and cardiac monitoring, review of multiple databases, neurological assessment, discussion with family, other specialists and medical decision making of high complexity. I spent 40 minutes of neurocritical care time in the care of this patient.  I had long discussion with husband at bedside, updated pt current condition, treatment plan and potential prognosis, and answered all the questions.  He expressed understanding and appreciation.   Rosalin Hawking, MD PhD Stroke Neurology 10/03/2020 4:52 PM    To contact Stroke Continuity provider, please refer to [email or web address] After hours, contact General Neurology

## 2020-10-03 NOTE — Evaluation (Signed)
Physical Therapy Evaluation Patient Details Name: Erin Yates MRN: [id number] DOB: 01/10/1946 Today's Date: 10/03/2020   History of Present Illness  74 y.o. female admitted on 10/02/20 for L sided weakness.  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  Pt with significant PMH of previous stroke with residual visual deficits (peripheral vision loss), but they do not hinder her function,PFO s/p closure, HTN, DM, anxiety, anemia, loop recorder placement.    Clinical Impression  Pt was attempting to climb out of bed when PT entered the room.  She is in a posey and R wrist restraint and husband, Laverna Peace is at bedside.  Pt is restless and reporting HA.  BPs are within stated parameters, but pt had a pending MRI, so therapy limited activity to EOB.  I also anticipate it will be safer due to her impulsivity to initiate gait with a second person.  She has some residual L sided weakness and L inattention in addition to cognitive deficits.  Her chart reports some baseline bil peripheral vision loss from another stroke.  She would benefit from intensive post acute multidisciplinary therapy prior to return home with her husband and family's assist.   PT to follow acutely for deficits listed below.      Follow Up Recommendations CIR    Equipment Recommendations  None recommended by PT    Recommendations for Other Services Rehab consult     Precautions / Restrictions Precautions Precautions: Fall Precaution Comments: impulsive, L inattention, L weakness, SBP <180      Mobility  Bed Mobility Overal bed mobility: Needs Assistance Bed Mobility: Supine to Sit;Sit to Supine     Supine to sit: Min assist;HOB elevated Sit to supine: Supervision;HOB elevated   General bed mobility comments: Min assist to come up mostly to keep her from scooting too far forward on the EOB.  Supervision to return to supine, pt lifting her own legs back into bed.      Transfers                  General transfer comment: NT as pt reported HA EOB and has pending post IR MRI.  BPs within parameters, but cautious given she is not 24 hours post stroke yet.   Ambulation/Gait                Stairs            Wheelchair Mobility    Modified Rankin (Stroke Patients Only) Modified Rankin (Stroke Patients Only) Pre-Morbid Rankin Score: Slight disability Modified Rankin: Moderately severe disability     Balance Overall balance assessment: Needs assistance Sitting-balance support: Feet unsupported;No upper extremity supported;Bilateral upper extremity supported Sitting balance-Leahy Scale: Fair Sitting balance - Comments: able to sit EOB with supervision, however, did not challenge her to reach outside of BOS.                                      Pertinent Vitals/Pain Pain Assessment: Faces Faces Pain Scale: Hurts little more Pain Location: head Pain Descriptors / Indicators: Aching Pain Intervention(s): Patient requesting pain meds-RN notified    Home Living Family/patient expects to be discharged to:: Private residence Living Arrangements: Spouse/significant other Available Help at Discharge: Family;Available 24 hours/day Type of Home: House Home Access: Stairs to enter Entrance Stairs-Rails: Right;Left;Can reach both Entrance Stairs-Number of Steps: 3 Home Layout: Two level;Able  to live on main level with bedroom/bathroom Home Equipment: Gilford Rile - 2 wheels;Cane - single point;Shower seat;Grab bars - tub/shower;Hand held shower head Additional Comments: wears glasses for reading, not here in the hospital.  Likes coloring (may need her glasses to do this successfully).     Prior Function Level of Independence: Independent         Comments: Ind amb community distances without an AD, no fall history, Ind with ADLs     Hand Dominance   Dominant Hand: Right    Extremity/Trunk Assessment   Upper Extremity  Assessment Upper Extremity Assessment: Defer to OT evaluation    Lower Extremity Assessment Lower Extremity Assessment: LLE deficits/detail;RLE deficits/detail RLE Deficits / Details: bil LE weakness 3+/5 with L more than R at this time.  Needs visual and verbal cues to move left side (helps if she looks at her arm or leg when the command is given).   LLE Deficits / Details: bil LE weakness 3+/5 with L more than R at this time.  Needs visual and verbal cues to move left side (helps if she looks at her arm or leg when the command is given).      Cervical / Trunk Assessment Cervical / Trunk Assessment: Normal  Communication      Cognition Arousal/Alertness: Awake/alert Behavior During Therapy: Restless;Impulsive;Anxious Overall Cognitive Status: Impaired/Different from baseline Area of Impairment: Attention;Memory;Following commands;Safety/judgement;Awareness;Problem solving                   Current Attention Level: Sustained Memory: Decreased recall of precautions;Decreased short-term memory Following Commands: Follows one step commands with increased time;Follows one step commands inconsistently Safety/Judgement: Decreased awareness of safety;Decreased awareness of deficits Awareness: Intellectual Problem Solving: Slow processing;Difficulty sequencing;Requires verbal cues;Requires tactile cues General Comments: Pt at times needs repeated basic commands, is a bit inattentive to her left side, so commands to move this side were more difficult, restless and decreased ability to tell me her left side was weak.  She could tell me she was at the hospital and had a stroke, but could not report the left sided weakness.        General Comments      Exercises     Assessment/Plan    PT Assessment Patient needs continued PT services  PT Problem List Decreased strength;Decreased activity tolerance;Decreased balance;Decreased coordination;Decreased mobility;Decreased  cognition;Decreased knowledge of use of DME;Decreased safety awareness;Decreased knowledge of precautions;Pain       PT Treatment Interventions Gait training;DME instruction;Stair training;Functional mobility training;Therapeutic activities;Therapeutic exercise;Balance training;Neuromuscular re-education;Cognitive remediation;Patient/family education;Wheelchair mobility training    PT Goals (Current goals can be found in the Care Plan section)  Acute Rehab PT Goals Patient Stated Goal: to go home before her granddaughter leaves for [place] PT Goal Formulation: With patient/family Time For Goal Achievement: 10/17/20 Potential to Achieve Goals: Good    Frequency Min 4X/week   Barriers to discharge        Co-evaluation               AM-PAC PT "6 Clicks" Mobility  Outcome Measure Help needed turning from your back to your side while in a flat bed without using bedrails?: A Little Help needed moving from lying on your back to sitting on the side of a flat bed without using bedrails?: A Little Help needed moving to and from a bed to a chair (including a wheelchair)?: A Little Help needed standing up from a chair using your arms (e.g., wheelchair or bedside chair)?: A Lot Help  needed to walk in hospital room?: A Lot Help needed climbing 3-5 steps with a railing? : Total 6 Click Score: 14    End of Session   Activity Tolerance: Other (comment);Patient limited by pain (limited by restlessness/HA) Patient left: in bed;with call bell/phone within reach;with bed alarm set;with family/visitor present Nurse Communication: Mobility status;Patient requests pain meds PT Visit Diagnosis: Muscle weakness (generalized) (M62.81);Difficulty in walking, not elsewhere classified (R26.2);Hemiplegia and hemiparesis Hemiplegia - Right/Left: Left Hemiplegia - dominant/non-dominant: Non-dominant Hemiplegia - caused by: Cerebral infarction    Time: 4888-9169 PT Time Calculation (min) (ACUTE ONLY): 25  min   Charges:   PT Evaluation $PT Eval Moderate Complexity: 1 Mod PT Treatments $Therapeutic Activity: 8-22 mins        Verdene Lennert, PT, DPT  Acute Rehabilitation [phone] pager [phone]) [phone] office

## 2020-10-03 NOTE — H&P (Signed)
Neurology Consultation Reason for Consult: Left sided weakness Referring Physician: Karle Starch, C  CC: Left sided weakness  History is obtained from:patient  HPI: Erin Yates is a 74 y.o. female with a history of diabetes, hypertension, atrial fibrillation as well as multiple previous strokes who presents with left-sided weakness that started abruptly around 5:45 PM.  She was walking outside when she was seen to go down to the ground.  When her husband evaluated her, she was confused and was looking to the right.  EMS was called who activated a code stroke en route.  She was taken emergently for a CT/CTA which revealed a proximal left M2 occlusion.  After discussion of risks and benefits of the procedure, family agreed and she was taken for emergent thrombectomy.  At baseline, she does have some visual difficulties due to her previous stroke, but she is able to accomplish all of her activities without assistance.  She states that she has not taken her evening dose of Eliquis, but did take her morning dose.  LKW: 5:45 PM tpa given?: no, anticoagulation Premorbid modified rankin scale: 2    ROS: A 14 point ROS was performed and is negative except as noted in the HPI.   Past Medical History:  Diagnosis Date  . Anemia    past  hx  . Anxiety   . Depression   . Diabetes mellitus without complication ([place])   . Dysrhythmia    prior to PFO correction  . GERD (gastroesophageal reflux disease)   . Headache    before PFO closure  . Hypercholesteremia   . Hypertension   . Hypothyroidism   . 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 PFO (patent foramen ovale)    correction device placed 2010  . Right leg numbness    lower leg- residual from CVA  . Seasonal allergies   . Stroke [facility])    prior to PFO correction     Family History  Problem Relation Age of Onset  . Dementia Father   . Breast cancer Sister 70     Social History:  reports that she has been smoking. She has a  55.00 pack-year smoking history. She has never used smokeless tobacco. She reports that she does not drink alcohol and does not use drugs.   Exam: Current vital signs: BP (!) 130/40 Comment: pt removed a line  Pulse 85   Temp 98.4 F (36.9 C) (Oral)   Resp 20   Ht 5\' 5"  (1.651 m)   Wt 61.1 kg   SpO2 97%   BMI 22.42 kg/m  Vital signs in last 24 hours: Temp:  [94.2 F (34.6 C)-98.4 F (36.9 C)] 98.4 F (36.9 C) (11/28 0000) Pulse Rate:  [80-112] 85 (11/27 2330) Resp:  [17-20] 20 (11/27 2330) BP: (108-130)/(40-96) 130/40 (11/27 2330) SpO2:  [91 %-100 %] 97 % (11/27 2330) Arterial Line BP: (121-169)/(43-60) 136/49 (11/27 2315) Weight:  [61.1 kg] 61.1 kg (11/27 1933)   Physical Exam  Constitutional: Appears well-developed and well-nourished.  Psych: Affect appropriate to situation Eyes: No scleral injection HENT: No OP obstrucion MSK: no joint deformities.  Cardiovascular: Normal rate and regular rhythm.  Respiratory: Effort normal, non-labored breathing GI: Soft.  No distension. There is no tenderness.  Skin: WDI  Neuro: Mental Status: Patient is awake, alert, she does not give her month or age.  She is able to answer questions and speak, but her speech is markedly delayed, appearing like an expressive aphasia.  Cranial Nerves: II: She does not endorse being able to see in any visual field, however if a hand is held in front of her face, she is able to grab it, suggesting there is some vision.  Pupils are equal, round, and reactive to light.   III,IV, VI: Right gaze forced deviation V: Facial sensation is absent on the left.  VII: Facial movement with left lower facial weakness Motor: She has 2/5 weakness of the left leg and 3/5 weakness of the left arm, she is able to hold both her right arm and leg against gravity without drift.   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  She is undergoing mechanical thrombectomy.  I suspect that etiology is likely embolic from her atrial fibrillation.  Recommendations: - HgbA1c, fasting lipid panel - MRI, MRA  of the brain without contrast - Frequent neuro checks - Echocardiogram - Prophylactic therapy-pending outcome of seizure - Risk factor modification - Telemetry monitoring - PT consult, OT consult, Speech consult - Stroke team to follow   This patient is critically ill and at significant risk of neurological worsening, death and care requires constant monitoring of vital signs, hemodynamics,respiratory and cardiac monitoring, neurological assessment, discussion with family, other specialists and medical decision making of high complexity. I spent 65 minutes of neurocritical care time  in the care of  this patient. This was time spent independent of any time provided by nurse practitioner or PA.  Roland Rack, MD [facility] [phone]  If 7pm- 7am, please page neurology on call as listed in [place]. 10/03/2020  12:56 AM

## 2020-10-03 NOTE — Progress Notes (Signed)
Attempted pt MRI with ativan - pt confused, moving head from side to side and full body movements. Very irritated and unable to proceed with exam. Pt going to CT instead and MRI to attempted at a later time per Dr. Erlinda Hong.

## 2020-10-03 NOTE — Progress Notes (Signed)
Referring Physician(s): * No referring provider recorded for this case *  Supervising Physician: Corrie Mckusick  Patient Status:  Erin Yates - In-pt  Chief Complaint: R MCA CVA  Subjective: Agitated, restless.  In restraints.  Husband at bedside.  Verbalizing, follows some commands.    Allergies: Alendronate and Penicillins  Medications: Prior to Admission medications   Medication Sig Start Date End Date Taking? Authorizing Provider  ELIQUIS 5 MG TABS tablet TAKE 1 TABLET (5 MG TOTAL) BY MOUTH 2 (TWO) TIMES DAILY. Patient taking differently: Take 5 mg by mouth 2 (two) times daily.  02/23/20  Yes Deboraha Sprang, MD  ALPRAZolam Duanne Moron) 0.25 MG tablet Take 0.25 mg by mouth 2 (two) times daily as needed for anxiety.    [provider]  amLODipine (NORVASC) 10 MG tablet Take 10 mg by mouth every evening.     [provider]  atorvastatin (LIPITOR) 80 MG tablet Take 80 mg by mouth daily.    [provider]  buPROPion (WELLBUTRIN SR) 150 MG 12 hr tablet Take 1 tablet (150 mg total) by mouth 2 (two) times daily. 03/27/16   Garvin Fila, MD  cholecalciferol (VITAMIN D3) 25 MCG (1000 UT) tablet Take 1,000 Units by mouth daily.    [provider]  desvenlafaxine (PRISTIQ) 100 MG 24 hr tablet Take 100 mg by mouth daily. 12/05/19   [provider]  ferrous sulfate 325 (65 FE) MG tablet Take 325 mg by mouth every evening.    [provider]  fluticasone (FLONASE) 50 MCG/ACT nasal spray Place into both nostrils daily as needed for allergies or rhinitis.    [provider]  hydrochlorothiazide (MICROZIDE) 12.5 MG capsule Take 12.5 mg by mouth daily as needed (fluid retention (feet/leg swelling)).     [provider]  levothyroxine (SYNTHROID, LEVOTHROID) 50 MCG tablet Take 50 mcg by mouth daily before breakfast.    [provider]  losartan (COZAAR) 100 MG tablet Take 100 mg by mouth daily.     [provider]   metFORMIN (GLUCOPHAGE) 1000 MG tablet Take 1,000 mg by mouth 2 (two) times daily with a meal.    [provider]  pantoprazole (PROTONIX) 40 MG tablet Take 40 mg by mouth 2 (two) times daily. Reported on 04/17/2016    [provider]  traZODone (DESYREL) 50 MG tablet Take 50 mg by mouth at bedtime as needed.     [provider]  vitamin B-12 (CYANOCOBALAMIN) 500 MCG tablet Take 500 mcg by mouth every evening.     [provider]     Vital Signs: BP 131/76 (BP Location: Left Arm)   Pulse 70   Temp 100 F (37.8 C) (Oral)   Resp (!) 22   Ht 5\' 5"  (1.651 m)   Wt 134 lb 11.2 oz (61.1 kg)   SpO2 98%   BMI 22.42 kg/m   Physical Exam  Agitated, restless, in restraints Neuro: does not open eyes, but responds verbally to questions although not always appropriately.  Moving right side, movement noted in LLE, but does not follow commands to squeeze or lift with left hand/arm.  Groin: soft, mildy tender.  No evidence of pseudoaneurysm or hematoma.   Imaging: CT Code Stroke CTA Head W/WO contrast  Result Date: 10/02/2020 CLINICAL DATA:  Left-sided weakness. EXAM: CT ANGIOGRAPHY HEAD AND NECK TECHNIQUE: Multidetector CT imaging of the head and neck was performed using the standard protocol during bolus administration of intravenous contrast. Multiplanar CT  image reconstructions and MIPs were obtained to evaluate the vascular anatomy. Carotid stenosis measurements (when applicable) are obtained utilizing NASCET criteria, using the distal internal carotid diameter as the denominator. CONTRAST:  194mL OMNIPAQUE IOHEXOL 350 MG/ML SOLN COMPARISON:  Head and neck MRA 11/11/2019 FINDINGS: CTA NECK FINDINGS Aortic arch: Normal variant aortic arch branching pattern with common origin of the brachiocephalic and left common carotid arteries. Mild arch atherosclerosis without arch vessel origin stenosis. Right carotid system: Patent with minimal calcified plaque in the proximal  ICA. No evidence of stenosis or dissection. Tortuous proximal common carotid artery. Left carotid system: Patent with minimal soft and calcified plaque in the proximal ICA. No evidence of stenosis or dissection. Tortuous proximal common carotid artery. Vertebral arteries: Patent with mild scattered plaque bilaterally. No evidence of stenosis or dissection. Slightly dominant left vertebral artery. Skeleton: Focally advanced disc degeneration at C4-5. Other neck: No evidence of cervical lymphadenopathy or mass. Upper chest: Mild centrilobular emphysema. Review of the MIP images confirms the above findings CTA HEAD FINDINGS Anterior circulation: The internal carotid arteries are patent from skull base to carotid termini with mild calcified plaque bilaterally not resulting in a significant stenosis. The right M1 segment is widely patent, however there is occlusion of the right M2 inferior division less than 1 cm beyond the MCA bifurcation. The left MCA and both ACAs are patent. There is an unchanged severe proximal left A1 stenosis. No aneurysm is identified. Posterior circulation: The intracranial vertebral arteries are widely patent to the basilar. Patent right PICA, left AICA, and bilateral SCA origins are identified. The basilar artery is widely patent. Posterior communicating arteries are diminutive or absent. The PCAs are patent without evidence of a significant proximal stenosis on the left. There is an unchanged moderate proximal right P2 stenosis. No aneurysm is identified. Venous sinuses: Not well evaluated due to arterial contrast timing. Anatomic variants: None. Review of the MIP images confirms the above findings IMPRESSION: 1. Emergent proximal right M2 occlusion. 2. Mild carotid and vertebral atherosclerosis without significant stenosis. 3.  Aortic Atherosclerosis (ICD10-I70.0). These results were communicated to Dr. Leonel Ramsay at 7:23 pm on 10/02/2020 by text page via the [facility] messaging system.  Electronically Signed   By: Logan Bores M.D.   On: 10/02/2020 19:44   CT HEAD WO CONTRAST  Result Date: 10/03/2020 CLINICAL DATA:  Altered mental status. EXAM: CT HEAD WITHOUT CONTRAST TECHNIQUE: Contiguous axial images were obtained from the base of the skull through the vertex without intravenous contrast. COMPARISON:  CT head October 02, 2020. IMPRESSION: Nondiagnostic evaluation due to patient intolerance and motion. The technologist was unable to perform a repeat exam due to safety concern. Electronically Signed   By: Margaretha Sheffield MD   On: 10/03/2020 13:24   CT Code Stroke CTA Neck W/WO contrast  Result Date: 10/02/2020 CLINICAL DATA:  Left-sided weakness. EXAM: CT ANGIOGRAPHY HEAD AND NECK TECHNIQUE: Multidetector CT imaging of the head and neck was performed using the standard protocol during bolus administration of intravenous contrast. Multiplanar CT image reconstructions and MIPs were obtained to evaluate the vascular anatomy. Carotid stenosis measurements (when applicable) are obtained utilizing NASCET criteria, using the distal internal carotid diameter as the denominator. CONTRAST:  135mL OMNIPAQUE IOHEXOL 350 MG/ML SOLN COMPARISON:  Head and neck MRA 11/11/2019 FINDINGS: CTA NECK FINDINGS Aortic arch: Normal variant aortic arch branching pattern with common origin of the brachiocephalic and left common carotid arteries. Mild arch atherosclerosis without arch vessel origin stenosis. Right carotid system: Patent with minimal  calcified plaque in the proximal ICA. No evidence of stenosis or dissection. Tortuous proximal common carotid artery. Left carotid system: Patent with minimal soft and calcified plaque in the proximal ICA. No evidence of stenosis or dissection. Tortuous proximal common carotid artery. Vertebral arteries: Patent with mild scattered plaque bilaterally. No evidence of stenosis or dissection. Slightly dominant left vertebral artery. Skeleton: Focally advanced disc  degeneration at C4-5. Other neck: No evidence of cervical lymphadenopathy or mass. Upper chest: Mild centrilobular emphysema. Review of the MIP images confirms the above findings CTA HEAD FINDINGS Anterior circulation: The internal carotid arteries are patent from skull base to carotid termini with mild calcified plaque bilaterally not resulting in a significant stenosis. The right M1 segment is widely patent, however there is occlusion of the right M2 inferior division less than 1 cm beyond the MCA bifurcation. The left MCA and both ACAs are patent. There is an unchanged severe proximal left A1 stenosis. No aneurysm is identified. Posterior circulation: The intracranial vertebral arteries are widely patent to the basilar. Patent right PICA, left AICA, and bilateral SCA origins are identified. The basilar artery is widely patent. Posterior communicating arteries are diminutive or absent. The PCAs are patent without evidence of a significant proximal stenosis on the left. There is an unchanged moderate proximal right P2 stenosis. No aneurysm is identified. Venous sinuses: Not well evaluated due to arterial contrast timing. Anatomic variants: None. Review of the MIP images confirms the above findings IMPRESSION: 1. Emergent proximal right M2 occlusion. 2. Mild carotid and vertebral atherosclerosis without significant stenosis. 3.  Aortic Atherosclerosis (ICD10-I70.0). These results were communicated to Dr. Leonel Ramsay at 7:23 pm on 10/02/2020 by text page via the [facility] messaging system. Electronically Signed   By: Logan Bores M.D.   On: 10/02/2020 19:44   CT HEAD CODE STROKE WO CONTRAST  Result Date: 10/02/2020 CLINICAL DATA:  Code stroke.  Left-sided weakness. EXAM: CT HEAD WITHOUT CONTRAST TECHNIQUE: Contiguous axial images were obtained from the base of the skull through the vertex without intravenous contrast. COMPARISON:  04/02/2020 FINDINGS: Brain: There is no evidence of an acute infarct, intracranial  hemorrhage, mass, midline shift, or extra-axial fluid collection. 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 Chronic lacunar infarcts in the right corona radiata and cerebellum are also unchanged. The ventricles are normal in size. Vascular: Calcified atherosclerosis at the skull base. Hyperdense right MCA in the region of the bifurcation. Skull: No fracture or suspicious osseous lesion. Sinuses/Orbits: Prior left-sided functional endoscopic sinus surgery. Paranasal sinuses and mastoid air cells are clear. Bilateral cataract extraction. Rightward gaze. Other: None. ASPECTS [facility] Stroke Program Early CT Score) - Ganglionic level infarction (caudate, lentiform nuclei, internal capsule, insula, M1-M3 cortex): 7 - Supraganglionic infarction (M4-M6 cortex): 3 Total score (0-10 with 10 being normal): 10 IMPRESSION: 1. No acute infarct or intracranial hemorrhage identified. 2. ASPECTS is 10. 3. Hyperdense right MCA which will be evaluated on pending CTA. 4. Multiple chronic infarcts. These results were communicated to Dr. Leonel Ramsay at 7:23 pm on 10/02/2020 by text page via the [facility] messaging system. Electronically Signed   By: Logan Bores M.D.   On: 10/02/2020 19:26    Labs:  CBC: Recent Labs    12/14/19 1105 12/14/19 1105 02/04/20 1121 10/02/20 1903 10/02/20 1909 10/03/20 0734  WBC 9.6  --  6.3 9.6  --  9.7  HGB 11.8*   < > 11.3* 11.7* 12.9 9.7*  HCT 35.8*   < > 34.6* 36.6 38.0 28.6*  PLT 210  --  244 227  --  215   < > = values in this interval not displayed.    COAGS: Recent Labs    11/10/19 0828 10/02/20 1903  INR 0.9 0.9  APTT 30 26    BMP: Recent Labs    11/11/19 0541 11/11/19 0541 11/12/19 0402 11/12/19 0402 12/14/19 1105 12/14/19 1105 02/04/20 1121 10/02/20 1903 10/02/20 1909 10/03/20 0734  NA 140   < > 140   < > 137   < > 139 136 139 140  K 4.0   < > 3.6   < > 4.2   < > 4.6 4.4 4.3 4.2  CL 107   < > 109   < > 105   < >  109 108 108 112*  CO2 22   < > 24   < > 20*  --  21* 15*  --  17*  GLUCOSE 75   < > 106*   < > 218*   < > 177* 178* 174* 85  BUN 15   < > 14   < > 14   < > 19 18 19 14   CALCIUM 9.2   < > 9.2   < > 9.3  --  9.4 9.7  --  8.5*  CREATININE 1.23*   < > 1.22*   < > 1.42*   < > 1.59* 1.60* 1.30* 1.46*  GFRNONAA 43*   < > 44*   < > 37*  --  32* 34*  --  38*  GFRAA 50*  --  51*  --  42*  --  37*  --   --   --    < > = values in this interval not displayed.    LIVER FUNCTION TESTS: Recent Labs    11/10/19 0828 12/14/19 1105 10/02/20 1903  BILITOT 0.3 0.4 0.3  AST 18 20 18   ALT 13 15 15   ALKPHOS 68 70 70  PROT 7.3 7.4 6.9  ALBUMIN 3.7 3.6 3.6    Assessment and Plan: S/p mechanical thrombectomy of M2 superior division after 3 passes achieving TICI 3 revascularization Small SAH at the sylvian fissure and right perisylvian sulci noted post-procedure Patient with agitation today.  Vocalizing, although not always appropriately.   Follows some commands.  Withdrawal left foot, but no movement noted in left hand/arm. Noted to currently be in restraints.  Groin intact.  No evidence of pseudoaneurysm or hematoma.  Could not undergo MRI today due to agitation.  IR following. Further plans per Neuro.  Electronically Signed: Docia Barrier, PA 10/03/2020, 5:07 PM   I spent a total of 15 Minutes at the the patient's bedside AND on the patient's hospital floor or unit, greater than 50% of which was counseling/coordinating care for R MCA CVA.

## 2020-10-03 NOTE — Progress Notes (Signed)
Notififed Dr, Leonel Ramsay of patients 0000 MRI and patients inability to remain still. [place] informed this RN to hold off on MRI and to obtain CT later in the morning.

## 2020-10-03 NOTE — Progress Notes (Signed)
Inpatient Rehab Admissions Coordinator Note:   Per therapy recommendations, pt was screened for CIR candidacy by Clemens Catholic, [place] CCC-SLP. At this time, therapy evaluations are limited due to pending post-IR MRI.  Pt. Is confused but requiring min assist to supervision level assist with bed mobility activities. I will hold on placing a consult for now and re-screen tomorrow.   Please contact me with questions.   Clemens Catholic, [place], [place] Admissions Coordinator  [phone] ([place]) [phone] (office)

## 2020-10-04 ENCOUNTER — Inpatient Hospital Stay (HOSPITAL_COMMUNITY): Payer: Medicare HMO

## 2020-10-04 ENCOUNTER — Telehealth: Payer: Self-pay | Admitting: Internal Medicine

## 2020-10-04 DIAGNOSIS — I6389 Other cerebral infarction: Secondary | ICD-10-CM | POA: Diagnosis not present

## 2020-10-04 DIAGNOSIS — Z8673 Personal history of transient ischemic attack (TIA), and cerebral infarction without residual deficits: Secondary | ICD-10-CM | POA: Diagnosis not present

## 2020-10-04 DIAGNOSIS — I639 Cerebral infarction, unspecified: Secondary | ICD-10-CM | POA: Diagnosis not present

## 2020-10-04 DIAGNOSIS — R410 Disorientation, unspecified: Secondary | ICD-10-CM | POA: Diagnosis not present

## 2020-10-04 DIAGNOSIS — I480 Paroxysmal atrial fibrillation: Secondary | ICD-10-CM | POA: Diagnosis not present

## 2020-10-04 DIAGNOSIS — G936 Cerebral edema: Secondary | ICD-10-CM

## 2020-10-04 LAB — ECHOCARDIOGRAM COMPLETE
Area-P 1/2: 3.85 cm2
Calc EF: 55.8 %
Height: 65 in
S' Lateral: 2.25 cm
Single Plane A2C EF: 53.6 %
Single Plane A4C EF: 56.7 %
Weight: 2155.22 oz

## 2020-10-04 LAB — BASIC METABOLIC PANEL
Anion gap: 13 (ref 5–15)
BUN: 11 mg/dL (ref 8–23)
CO2: 17 mmol/L — ABNORMAL LOW (ref 22–32)
Calcium: 8.8 mg/dL — ABNORMAL LOW (ref 8.9–10.3)
Chloride: 109 mmol/L (ref 98–111)
Creatinine, Ser: 1.28 mg/dL — ABNORMAL HIGH (ref 0.44–1.00)
GFR, Estimated: 44 mL/min — ABNORMAL LOW (ref >60)
Glucose, Bld: 99 mg/dL (ref 70–99)
Potassium: 3.7 mmol/L (ref 3.5–5.1)
Sodium: 139 mmol/L (ref 135–145)

## 2020-10-04 LAB — GLUCOSE, CAPILLARY
Glucose-Capillary: 107 mg/dL — ABNORMAL HIGH (ref 70–99)
Glucose-Capillary: 113 mg/dL — ABNORMAL HIGH (ref 70–99)
Glucose-Capillary: 119 mg/dL — ABNORMAL HIGH (ref 70–99)
Glucose-Capillary: 125 mg/dL — ABNORMAL HIGH (ref 70–99)
Glucose-Capillary: 140 mg/dL — ABNORMAL HIGH (ref 70–99)
Glucose-Capillary: 144 mg/dL — ABNORMAL HIGH (ref 70–99)
Glucose-Capillary: 149 mg/dL — ABNORMAL HIGH (ref 70–99)

## 2020-10-04 LAB — TROPONIN I (HIGH SENSITIVITY)
Troponin I (High Sensitivity): 569 ng/L (ref <18)
Troponin I (High Sensitivity): 804 ng/L (ref <18)

## 2020-10-04 LAB — CBC
HCT: 30.5 % — ABNORMAL LOW (ref 36.0–46.0)
Hemoglobin: 10.3 g/dL — ABNORMAL LOW (ref 12.0–15.0)
MCH: 32 pg (ref 26.0–34.0)
MCHC: 33.8 g/dL (ref 30.0–36.0)
MCV: 94.7 fL (ref 80.0–100.0)
Platelets: 232 10*3/uL (ref 150–400)
RBC: 3.22 MIL/uL — ABNORMAL LOW (ref 3.87–5.11)
RDW: 12.9 % (ref 11.5–15.5)
WBC: 11.5 10*3/uL — ABNORMAL HIGH (ref 4.0–10.5)
nRBC: 0 % (ref 0.0–0.2)

## 2020-10-04 MED ORDER — VITAMIN B-12 500 MCG PO TABS: 500.0000 ug | ORAL_TABLET | Freq: Every evening | ORAL | Status: DC

## 2020-10-04 MED ORDER — ATORVASTATIN CALCIUM 80 MG PO TABS
80.0000 mg | ORAL_TABLET | Freq: Every day | ORAL | Status: DC
  Administered 2020-10-04 – 2020-10-06 (×3): 80 mg via ORAL
  Filled 2020-10-04 (×3): qty 1

## 2020-10-04 MED ORDER — VENLAFAXINE HCL ER 75 MG PO CP24
150.0000 mg | ORAL_CAPSULE | Freq: Every day | ORAL | Status: DC
  Administered 2020-10-05 – 2020-10-12 (×8): 150 mg via ORAL
  Filled 2020-10-04: qty 2
  Filled 2020-10-04: qty 1
  Filled 2020-10-04 (×4): qty 2
  Filled 2020-10-04 (×3): qty 1

## 2020-10-04 MED ORDER — METOPROLOL TARTRATE 5 MG/5ML IV SOLN
5.0000 mg | INTRAVENOUS | Status: AC | PRN
  Administered 2020-10-04: 5 mg via INTRAVENOUS
  Filled 2020-10-04: qty 5

## 2020-10-04 MED ORDER — HALOPERIDOL LACTATE 5 MG/ML IJ SOLN
5.0000 mg | Freq: Four times a day (QID) | INTRAMUSCULAR | Status: DC | PRN
  Administered 2020-10-04 (×2): 5 mg via INTRAVENOUS
  Filled 2020-10-04 (×4): qty 1

## 2020-10-04 MED ORDER — QUETIAPINE FUMARATE 25 MG PO TABS
25.0000 mg | ORAL_TABLET | Freq: Two times a day (BID) | ORAL | Status: DC
  Administered 2020-10-04 – 2020-10-12 (×16): 25 mg via ORAL
  Filled 2020-10-04 (×16): qty 1

## 2020-10-04 MED ORDER — LORAZEPAM 2 MG/ML IJ SOLN
2.0000 mg | Freq: Once | INTRAMUSCULAR | Status: AC
  Administered 2020-10-04: 2 mg via INTRAVENOUS
  Filled 2020-10-04: qty 1

## 2020-10-04 MED ORDER — BUPROPION HCL ER (SR) 150 MG PO TB12
150.0000 mg | ORAL_TABLET | Freq: Two times a day (BID) | ORAL | Status: DC
  Administered 2020-10-05 – 2020-10-12 (×14): 150 mg via ORAL
  Filled 2020-10-04 (×18): qty 1

## 2020-10-04 MED ORDER — METOPROLOL TARTRATE 5 MG/5ML IV SOLN
INTRAVENOUS | Status: AC
  Administered 2020-10-04: 5 mg via INTRAVENOUS
  Filled 2020-10-04: qty 5

## 2020-10-04 MED ORDER — LORAZEPAM 2 MG/ML IJ SOLN
2.0000 mg | Freq: Four times a day (QID) | INTRAMUSCULAR | Status: DC | PRN
  Administered 2020-10-04: 2 mg via INTRAVENOUS
  Filled 2020-10-04 (×2): qty 1

## 2020-10-04 MED ORDER — AMLODIPINE BESYLATE 10 MG PO TABS
10.0000 mg | ORAL_TABLET | Freq: Every evening | ORAL | Status: DC
  Administered 2020-10-04: 10 mg via ORAL
  Filled 2020-10-04: qty 1

## 2020-10-04 MED ORDER — LEVOTHYROXINE SODIUM 50 MCG PO TABS
50.0000 ug | ORAL_TABLET | Freq: Every day | ORAL | Status: DC
  Administered 2020-10-05 – 2020-10-12 (×8): 50 ug via ORAL
  Filled 2020-10-04 (×8): qty 1

## 2020-10-04 MED ORDER — VITAMIN B-12 100 MCG PO TABS
500.0000 ug | ORAL_TABLET | Freq: Every evening | ORAL | Status: DC
  Administered 2020-10-04 – 2020-10-11 (×8): 500 ug via ORAL
  Filled 2020-10-04 (×2): qty 5
  Filled 2020-10-04: qty 1
  Filled 2020-10-04: qty 5
  Filled 2020-10-04: qty 1
  Filled 2020-10-04: qty 5
  Filled 2020-10-04 (×2): qty 1

## 2020-10-04 MED ORDER — PANTOPRAZOLE SODIUM 40 MG PO PACK
40.0000 mg | PACK | Freq: Every day | ORAL | Status: DC
  Filled 2020-10-04: qty 20

## 2020-10-04 NOTE — Evaluation (Signed)
Speech Language Pathology Evaluation Patient Details Name: Erin Yates MRN: [id number] DOB: 1946-08-08 Today's Date: 10/04/2020 Time: 8251-8984 SLP Time Calculation (min) (ACUTE ONLY): 23 min  Problem List:  Patient Active Problem List   Diagnosis Date Noted  . Mitral valve disorder   . AF (paroxysmal atrial fibrillation) ([place]) 11/13/2019  . CVA (cerebral vascular accident) ([place]) 11/11/2019  . Stroke ([place]) 11/10/2019  . Type II diabetes mellitus with renal manifestations ([place]) 11/10/2019  . Depression   . GERD (gastroesophageal reflux disease)   . Hypertension   . Hypothyroidism   . Hypercholesteremia   . Tobacco abuse   . Cryptogenic stroke ([place]) 03/27/2016  . Stroke (cerebrum) ([place])   . CVA (cerebral infarction) 02/07/2016   Past Medical History:  Past Medical History:  Diagnosis Date  . Anemia    past  hx  . Anxiety   . Depression   . Diabetes mellitus without complication ([place])   . Dysrhythmia    prior to PFO correction  . GERD (gastroesophageal reflux disease)   . Headache    before PFO closure  . Hypercholesteremia   . Hypertension   . Hypothyroidism   . 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 PFO (patent foramen ovale)    correction device placed 2010  . Right leg numbness    lower leg- residual from CVA  . Seasonal allergies   . Stroke [facility])    prior to PFO correction   Past Surgical History:  Past Surgical History:  Procedure Laterality Date  . ABDOMINAL HYSTERECTOMY    . BUBBLE STUDY  02/06/2020   Procedure: BUBBLE STUDY;  Surgeon: Sanda Klein, MD;  Location: [place] ENDOSCOPY;  Service: Cardiovascular;;  . [place]  . CATARACT EXTRACTION W/PHACO Right 09/01/2015   Procedure: CATARACT EXTRACTION PHACO AND INTRAOCULAR LENS PLACEMENT (IOC);  Surgeon: Leandrew Koyanagi, MD;  Location: [place];  Service: Ophthalmology;  Laterality: Right;  DIABETIC - oral meds  . CATARACT EXTRACTION W/PHACO Left 10/20/2015    Procedure: CATARACT EXTRACTION PHACO AND INTRAOCULAR LENS PLACEMENT (IOC);  Surgeon: Leandrew Koyanagi, MD;  Location: [place];  Service: Ophthalmology;  Laterality: Left;  DIABETIC - oral meds  . EP IMPLANTABLE DEVICE N/A 04/04/2016   Procedure: Loop Recorder Insertion;  Surgeon: Deboraha Sprang, MD;  Location: [place] CV LAB;  Service: Cardiovascular;  Laterality: N/A;  . FOOT NEUROMA SURGERY Left   . IR CT HEAD LTD  10/02/2020  . IR PERCUTANEOUS ART THROMBECTOMY/INFUSION INTRACRANIAL INC DIAG ANGIO  10/02/2020  . IR US GUIDE VASC ACCESS RIGHT  10/02/2020  . NASAL SINUS SURGERY    . PATENT FORAMEN OVALE CLOSURE    . PATENT FORAMEN OVALE CLOSURE    . RADIOLOGY WITH ANESTHESIA N/A 10/02/2020   Procedure: IR WITH ANESTHESIA;  Surgeon: Luanne Bras, MD;  Location: [facility];  Service: Radiology;  Laterality: N/A;  . TEE WITHOUT CARDIOVERSION N/A 02/09/2016   Procedure: TRANSESOPHAGEAL ECHOCARDIOGRAM (TEE);  Surgeon: Adrian Prows, MD;  Location: [place];  Service: Cardiovascular;  Laterality: N/A;  . TEE WITHOUT CARDIOVERSION N/A 11/12/2019   Procedure: TRANSESOPHAGEAL ECHOCARDIOGRAM (TEE);  Surgeon: Minna Merritts, MD;  Location: [facility] ORS;  Service: Cardiovascular;  Laterality: N/A;  . TEE WITHOUT CARDIOVERSION N/A 02/06/2020   Procedure: TRANSESOPHAGEAL ECHOCARDIOGRAM (TEE);  Surgeon: Sanda Klein, MD;  Location[facility] END[facility]COPY;  Service: Cardiovascular;  Laterality: N/A;   HPI:  74 y.o. female admitted on 10/02/20 for L sided  weakness.  CT scan revealed a R M2 occlusion and she underwent emergent thrombectomy in IR.  Pt with significant PMH of GERD, previous stroke with residual visual deficits (peripheral vision loss), but they do not hinder her function, PFO s/p closure, HTN, DM, anxiety, anemia, loop recorder placement. Pt was evaluated by SLP in 11/2019 with oropharyngeal swallow WFL but mild-moderate aphasia noted with receptive and expressive difficulties.    Assessment / Plan / Recommendation Clinical Impression  Pt presents with cognitive and communicative impairments that become more pronounced as evaluation continued due to increasing levels of anxiety. Pt had a h/o aphasia s/p prior stroke but her husband said that this had almost completely resolved, and described it more as "stuttering." Today she has word-finding errors, of which she is aware but cannot compensate for without assistance. She also has difficulty with more functional problem solving. Receptively she is 60% accurate with mildly complex yes/no questions and needs up to Mod cues for completion of one-step commands. She is impulsive with reduced safety awareness, trying to climb out of her chair x2 to use the bedside commode. She will benefit from SLP f/u to maximize safety and independence.     SLP Assessment  SLP Recommendation/Assessment: Patient needs continued Speech Lanaguage Pathology Services SLP Visit Diagnosis: Cognitive communication deficit (R41.841)    Follow Up Recommendations  Inpatient Rehab    Frequency and Duration min 2x/week  2 weeks      SLP Evaluation Cognition  Overall Cognitive Status: Impaired/Different from baseline Arousal/Alertness: Awake/alert Orientation Level: Oriented to person;Oriented to place;Oriented to situation;Disoriented to time Attention: Sustained Sustained Attention: Impaired Sustained Attention Impairment: Verbal basic;Functional basic Memory: Impaired Memory Impairment: Decreased recall of new information Awareness: Impaired Awareness Impairment: Intellectual impairment;Emergent impairment;Anticipatory impairment Problem Solving: Impaired Problem Solving Impairment: Functional basic;Verbal basic Behaviors: Impulsive Safety/Judgment: Impaired       Comprehension  Auditory Comprehension Overall Auditory Comprehension: Impaired Yes/No Questions: Impaired Basic Biographical Questions: 76-100% accurate Basic Immediate  Environment Questions: 75-100% accurate Complex Questions: 50-74% accurate Commands: Impaired One Step Basic Commands: 50-74% accurate Conversation: Simple Interfering Components: Anxiety;Visual impairments    Expression Expression Primary Mode of Expression: Verbal Verbal Expression Overall Verbal Expression: Impaired Initiation: No impairment Automatic Speech: Name;Social Response Level of Generative/Spontaneous Verbalization: Phrase;Sentence Naming: Impairment Verbal Errors: Aware of errors;Other (comment) (word finding errors) Interfering Components: Attention Non-Verbal Means of Communication: Not applicable   Oral / Motor  Oral Motor/Sensory Function Overall Oral Motor/Sensory Function: Mild impairment Facial ROM: Reduced left;Suspected CN VII (facial) dysfunction Facial Symmetry: Abnormal symmetry left;Suspected CN VII (facial) dysfunction Facial Strength: Reduced left;Suspected CN VII (facial) dysfunction Facial Sensation: Reduced left;Suspected CN V (Trigeminal) dysfunction Lingual ROM: Within Functional Limits Lingual Symmetry: Within Functional Limits Lingual Strength: Within Functional Limits Motor Speech Overall Motor Speech: Appears within functional limits for tasks assessed   GO                    Osie Bond., M.A. [place] [facility] Services Pager [phone] Office [phone]  10/04/2020, 3:04 PM

## 2020-10-04 NOTE — Evaluation (Signed)
Occupational Therapy Evaluation Patient Details Name: Erin Yates MRN: [id number] DOB: 05/30/1946 Today's Date: 10/04/2020    History of Present Illness 74 y.o. female admitted on 10/02/20 for L sided weakness.  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  Pt with significant PMH of previous stroke with residual visual deficits (peripheral vision loss), but they do not hinder her function,PFO s/p closure, HTN, DM, anxiety, anemia, loop recorder placement.     Clinical Impression   This 74 y/o female presents with the above. PTA pt independent with ADL and functional mobility, living with spouse. Pt now presenting with the above and below listed deficits. At time of session pt with notable anxiousness/restlessness throughout. She tolerated room level mobility using two person HHA, and requiring up to [facility] for ADL tasks. VSS throughout. Pt also with baseline visual deficits which impact her ability to perform functional tasks safely, with reduced ability to problem solve or work through compensatory techniques given visual difficulties.  Pt easily distracted and requiring encouragement, redirection throughout session. She will benefit from continued acute OT services and currently feel she will benefit from CIR level therapies at time of discharge to maximize her safety and independence with ADL and mobility.     Follow Up Recommendations  CIR    Equipment Recommendations  Other (comment) (TBD)           Precautions / Restrictions Precautions Precautions: Fall Precaution Comments: impulsive, L inattention, L weakness, SBP <180 Restrictions Weight Bearing Restrictions: No      Mobility Bed Mobility Overal bed mobility: Needs Assistance Bed Mobility: Supine to Sit     Supine to sit: HOB elevated;Supervision     General bed mobility comments: pt able to come to long sitting in bed without any assist, then scooted to EOB once bed rail lowered  by staff    Transfers Overall transfer level: Needs assistance Equipment used: 2 person hand held assist Transfers: Sit to/from Stand Sit to Stand: Min assist;+2 safety/equipment         General transfer comment: minA of 2 for pt comfort and stability.    Balance Overall balance assessment: Needs assistance Sitting-balance support: Feet unsupported;No upper extremity supported;Bilateral upper extremity supported Sitting balance-Leahy Scale: Fair Sitting balance - Comments: able to sit EOB with supervision, however, did not challenge her to reach outside of BOS.    Standing balance support: Single extremity supported Standing balance-Leahy Scale: Poor Standing balance comment: reliant on UE support                           ADL either performed or assessed with clinical judgement   ADL Overall ADL's : Needs assistance/impaired     Grooming: Wash/dry hands;Standing;Moderate assistance Grooming Details (indicate cue type and reason): assist to locate items and turn on faucet  Upper Body Bathing: Minimal assistance;Sitting   Lower Body Bathing: Moderate assistance;+2 for safety/equipment;Sit to/from stand   Upper Body Dressing : Minimal assistance;Sitting   Lower Body Dressing: Moderate assistance;+2 for safety/equipment;Sit to/from stand   Toilet Transfer: Minimal assistance;+2 for physical assistance;+2 for safety/equipment;Grab bars;Regular Toilet (HHA)   Toileting- Clothing Manipulation and Hygiene: Minimal assistance;+2 for safety/equipment;Sit to/from stand Toileting - Clothing Manipulation Details (indicate cue type and reason): assist to locate toilet paper, steadying assist in standing while pt performing pericare      Functional mobility during ADLs: Minimal assistance;+2 for safety/equipment;+2 for physical assistance (HHA)  Vision Baseline Vision/History:  (peripheral visual deficit at baseline) Patient Visual Report: Peripheral vision  impairment Additional Comments: will continue to assess - pt needing tactile/verbal cues for locating navigating bathroom doorway, locating grab bar next to toilet and recliner in room      Perception     Praxis      Pertinent Vitals/Pain Pain Assessment: Faces Faces Pain Scale: Hurts a little bit Pain Location: general with mobility Pain Descriptors / Indicators: Grimacing;Discomfort Pain Intervention(s): Monitored during session;Repositioned     Hand Dominance Right   Extremity/Trunk Assessment Upper Extremity Assessment Upper Extremity Assessment: Generalized weakness;Difficult to assess due to impaired cognition (seems symmetricaly weak, unable to fully assess d/t anxiety )   Lower Extremity Assessment Lower Extremity Assessment: Defer to PT evaluation   Cervical / Trunk Assessment Cervical / Trunk Assessment: Normal   Communication Communication Communication: Other (comment) (slightly mumbled speech)   Cognition Arousal/Alertness: Awake/alert Behavior During Therapy: Restless;Impulsive;Anxious Overall Cognitive Status: Impaired/Different from baseline Area of Impairment: Attention;Memory;Following commands;Safety/judgement;Awareness;Problem solving                   Current Attention Level: Sustained Memory: Decreased recall of precautions;Decreased short-term memory Following Commands: Follows one step commands with increased time;Follows one step commands inconsistently Safety/Judgement: Decreased awareness of safety;Decreased awareness of deficits Awareness: Intellectual Problem Solving: Slow processing;Difficulty sequencing;Requires verbal cues;Requires tactile cues General Comments: Pt benefits from repeated commands at times and max encouragement/calming due to anxiety but the pt is redirectable and pleasant. Decreased safety awareness.    General Comments  VSS on RA, pt reports she is very cold, and shaking due to cold, given 2 warm blankets; spouse  present during session; pt also provided coloring book end of session to aide in distraction/reduce anxiousness     Exercises     Shoulder Instructions      Home Living Family/patient expects to be discharged to:: Private residence Living Arrangements: Spouse/significant other Available Help at Discharge: Family;Available 24 hours/day Type of Home: House Home Access: Stairs to enter [employer] of Steps: 3 Entrance Stairs-Rails: Right;Left;Can reach both Home Layout: Two level;Able to live on main level with bedroom/bathroom     Bathroom Shower/Tub: Tub/shower unit;Curtain   Bathroom Toilet: Standard     Home Equipment: Environmental consultant - 2 wheels;Cane - single point;Shower seat;Grab bars - tub/shower;Hand held shower head   Additional Comments: wears glasses for reading, not here in the hospital.  Likes coloring (may need her glasses to do this successfully).   Lives With: Spouse    Prior Functioning/Environment Level of Independence: Independent        Comments: Ind amb community distances without an AD, no fall history, Ind with ADLs        OT Problem List: Decreased strength;Decreased range of motion;Decreased activity tolerance;Impaired balance (sitting and/or standing);Decreased cognition;Decreased safety awareness;Decreased knowledge of use of DME or AE;Impaired vision/perception      OT Treatment/Interventions: Self-care/ADL training;Therapeutic exercise;Energy conservation;DME and/or AE instruction;Therapeutic activities;Cognitive remediation/compensation;Visual/perceptual remediation/compensation;Patient/family education;Balance training    OT Goals(Current goals can be found in the care plan section) Acute Rehab OT Goals Patient Stated Goal: to go home before her granddaughter leaves for [place] OT Goal Formulation: With patient Time For Goal Achievement: 10/18/20 Potential to Achieve Goals: Good  OT Frequency: Min 2X/week   Barriers to D/C:             Co-evaluation PT/OT/SLP Co-Evaluation/Treatment: Yes Reason for Co-Treatment: Complexity of the patient's impairments (multi-system involvement);For patient/therapist safety;To address functional/ADL transfers;Necessary to address  cognition/behavior during functional activity PT goals addressed during session: Mobility/safety with mobility;Balance;Proper use of DME OT goals addressed during session: ADL's and self-care      AM-PAC OT "6 Clicks" Daily Activity     Outcome Measure Help from another person eating meals?: A Lot Help from another person taking care of personal grooming?: A Lot Help from another person toileting, which includes using toliet, bedpan, or urinal?: A Lot Help from another person bathing (including washing, rinsing, drying)?: A Lot Help from another person to put on and taking off regular upper body clothing?: A Lot Help from another person to put on and taking off regular lower body clothing?: A Lot 6 Click Score: 12   End of Session Equipment Utilized During Treatment: Gait belt Nurse Communication: Mobility status  Activity Tolerance: Patient tolerated treatment well Patient left: in chair;with call bell/phone within reach;with chair alarm set;with family/visitor present  OT Visit Diagnosis: Muscle weakness (generalized) (M62.81);Unsteadiness on feet (R26.81);Other symptoms and signs involving cognitive function                Time: 1115-1138 OT Time Calculation (min): 23 min Charges:  OT General Charges $OT Visit: 1 Visit OT Evaluation $OT Eval Moderate Complexity: [place], OT Acute Rehabilitation Services Pager [phone] Office [phone]   Raymondo Band 10/04/2020, 3:50 PM

## 2020-10-04 NOTE — Telephone Encounter (Signed)
Patient daughter calling in to report mother had a stroke and is currently in [place]. Zio monitor was removed on 11/27 ( 5 days earlier then expected). Daughter is going to mail the monitor back  Daughter would like to speak with nurse when able

## 2020-10-04 NOTE — Evaluation (Signed)
Clinical/Bedside Swallow Evaluation Patient Details  Name: Erin Yates MRN: [id number] Date of Birth: [birth year]  Today's Date: 10/04/2020 Time: SLP Start Time (ACUTE ONLY): 103 SLP Stop Time (ACUTE ONLY): 1055 SLP Time Calculation (min) (ACUTE ONLY): 15 min  Past Medical History:  Past Medical History:  Diagnosis Date  . Anemia    past  hx  . Anxiety   . Depression   . Diabetes mellitus without complication ([place])   . Dysrhythmia    prior to PFO correction  . GERD (gastroesophageal reflux disease)   . Headache    before PFO closure  . Hypercholesteremia   . Hypertension   . Hypothyroidism   . 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 PFO (patent foramen ovale)    correction device placed 2010  . Right leg numbness    lower leg- residual from CVA  . Seasonal allergies   . Stroke [facility])    prior to PFO correction   Past Surgical History:  Past Surgical History:  Procedure Laterality Date  . ABDOMINAL HYSTERECTOMY    . BUBBLE STUDY  02/06/2020   Procedure: BUBBLE STUDY;  Surgeon: Sanda Klein, MD;  Location: [place] [facility];  Service: Cardiovascular;;  . [place]  . CATARACT EXTRACTION W/PHACO Right 09/01/2015   Procedure: CATARACT EXTRACTION PHACO AND INTRAOCULAR LENS PLACEMENT (IOC);  Surgeon: Leandrew Koyanagi, MD;  Location: [place];  Service: Ophthalmology;  Laterality: Right;  DIABETIC - oral meds  . CATARACT EXTRACTION W/PHACO Left 10/20/2015   Procedure: CATARACT EXTRACTION PHACO AND INTRAOCULAR LENS PLACEMENT (IOC);  Surgeon: Leandrew Koyanagi, MD;  Location: [place];  Service: Ophthalmology;  Laterality: Left;  DIABETIC - oral meds  . EP IMPLANTABLE DEVICE N/A 04/04/2016   Procedure: Loop Recorder Insertion;  Surgeon: Deboraha Sprang, MD;  Location: [facility];  Service: Cardiovascular;  Laterality: N/A;  . FOOT NEUROMA SURGERY Left   . IR CT HEAD LTD  10/02/2020  . IR PERCUTANEOUS ART  THROMBECTOMY/INFUSION INTRACRANIAL INC DIAG ANGIO  10/02/2020  . IR US GUIDE VASC ACCESS RIGHT  10/02/2020  . NASAL SINUS SURGERY    . PATENT FORAMEN OVALE CLOSURE    . PATENT FORAMEN OVALE CLOSURE    . RADIOLOGY WITH ANESTHESIA N/A 10/02/2020   Procedure: IR WITH ANESTHESIA;  Surgeon: Luanne Bras, MD;  Location: [place];  Service: Radiology;  Laterality: N/A;  . TEE WITHOUT CARDIOVERSION N/A 02/09/2016   Procedure: TRANSESOPHAGEAL ECHOCARDIOGRAM (TEE);  Surgeon: Adrian Prows, MD;  Location: [place];  Service: Cardiovascular;  Laterality: N/A;  . TEE WITHOUT CARDIOVERSION N/A 11/12/2019   Procedure: TRANSESOPHAGEAL ECHOCARDIOGRAM (TEE);  Surgeon: Minna Merritts, MD;  Location: [facility];  Service: Cardiovascular;  Laterality: N/A;  . TEE WITHOUT CARDIOVERSION N/A 02/06/2020   Procedure: TRANSESOPHAGEAL ECHOCARDIOGRAM (TEE);  Surgeon: Sanda Klein, MD;  Location: [facility];  Service: Cardiovascular;  Laterality: N/A;   HPI:  74 y.o. female admitted on 10/02/20 for L sided weakness.  CT scan revealed a R M2 occlusion and she underwent emergent thrombectomy in IR.  Pt with significant PMH of GERD, previous stroke with residual visual deficits (peripheral vision loss), but they do not hinder her function, PFO s/p closure, HTN, DM, anxiety, anemia, loop recorder placement. Pt was evaluated by SLP in 11/2019 with oropharyngeal swallow WFL but mild-moderate aphasia noted with receptive and expressive difficulties.   Assessment / Plan / Recommendation Clinical Impression  Pt has mild L-sided facial  weakness with suspected sensory impairments as well, resulting in moderate L buccal pocketing and more mild anterior spillage. Pt coughs frequently with thin liquid trials, at times after a brief delay, but this is not observed at all with other consistencies tested. Pt also became increasingly anxious throughout the evaluation. Recommend starting with Dys 2 (chopped) diet and nectar thick liquids with  SLP f/u to determine if further testing is needed.   SLP Visit Diagnosis: Dysphagia, unspecified (R13.10)    Aspiration Risk  Mild aspiration risk;Moderate aspiration risk    Diet Recommendation Dysphagia 2 (Fine chop);Nectar-thick liquid   Liquid Administration via: Cup;Straw Medication Administration: Whole meds with puree Supervision: Patient able to self feed;Full supervision/cueing for compensatory strategies Compensations: Minimize environmental distractions;Slow rate;Small sips/bites;Lingual sweep for clearance of pocketing;Monitor for anterior loss Postural Changes: Seated upright at 90 degrees    Other  Recommendations Oral Care Recommendations: Oral care BID Other Recommendations: Order thickener from pharmacy;Prohibited food (jello, ice cream, thin soups);Remove water pitcher   Follow up Recommendations Inpatient Rehab      Frequency and Duration min 2x/week  2 weeks       Prognosis Prognosis for Safe Diet Advancement: Good Barriers to Reach Goals: Cognitive deficits      Swallow Study   General HPI: 74 y.o. female admitted on 10/02/20 for L sided weakness.  CT scan revealed a R M2 occlusion and she underwent emergent thrombectomy in IR.  Pt with significant PMH of GERD, previous stroke with residual visual deficits (peripheral vision loss), but they do not hinder her function, PFO s/p closure, HTN, DM, anxiety, anemia, loop recorder placement. Pt was evaluated by SLP in 11/2019 with oropharyngeal swallow WFL but mild-moderate aphasia noted with receptive and expressive difficulties. Type of Study: Bedside Swallow Evaluation Previous Swallow Assessment: see HPI Diet Prior to this Study: NPO Temperature Spikes Noted: Yes (100.9) Respiratory Status: Room air History of Recent Intubation: No Behavior/Cognition: Alert;Cooperative;Requires cueing Oral Cavity Assessment: Within Functional Limits Oral Care Completed by SLP: No Oral Cavity - Dentition: Adequate natural  dentition;Missing dentition Vision: Impaired for self-feeding Self-Feeding Abilities: Able to feed self;Needs assist Patient Positioning: Upright in chair Baseline Vocal Quality: Normal    Oral/Motor/Sensory Function Overall Oral Motor/Sensory Function: Mild impairment Facial ROM: Reduced left;Suspected CN VII (facial) dysfunction Facial Symmetry: Abnormal symmetry left;Suspected CN VII (facial) dysfunction Facial Strength: Reduced left;Suspected CN VII (facial) dysfunction Facial Sensation: Reduced left;Suspected CN V (Trigeminal) dysfunction Lingual ROM: Within Functional Limits Lingual Symmetry: Within Functional Limits Lingual Strength: Within Functional Limits   Ice Chips Ice chips: Not tested   Thin Liquid Thin Liquid: Impaired Presentation: Cup;Self Fed;Straw Oral Phase Impairments: Reduced labial seal Oral Phase Functional Implications: Left anterior spillage Pharyngeal  Phase Impairments: Cough - Immediate;Cough - Delayed    Nectar Thick Nectar Thick Liquid: Within functional limits Presentation: Self Fed;Straw   Honey Thick Honey Thick Liquid: Not tested   Puree Puree: Within functional limits Presentation: Self Fed;Spoon   Solid     Solid: Impaired Presentation: Self Fed Oral Phase Functional Implications: Left anterior spillage;Left lateral sulci pocketing      Osie Bond., M.A. [place] Pager [phone] Office [phone]  10/04/2020,2:58 PM

## 2020-10-04 NOTE — Progress Notes (Signed)
  Echocardiogram 2D Echocardiogram has been performed.  Erin Yates 10/04/2020, 10:29 AM

## 2020-10-04 NOTE — Telephone Encounter (Signed)
I called and spoke with the patient's daughter Roselyn Reef. She advised that the patient had a stroke on Saturday night at home. She fell and was taken to [facility].   Per Roselyn Reef, they have been advised this was not related to a clot, but to due to a blockage. The patient had thrombectomy today. Per Roselyn Reef, the physician told them that the blockage was like a "rubbery tissue," but there was not clot so the eliquis seemed to be working.   The patient has had to be restrained while an inpatient and is very agitated.  She did initially have some left hand weakness. They are unclear if she has worsening visual disturbance as she was having trouble with her peripheral vision prior to this (after previous strokes).   Roselyn Reef wanted to let us know the patient was wearing her 2nd 14 day ZIO monitor, but this was taken off by the hospital staff.  She was due to wear another 5 days or so.  I have advised Roselyn Reef that we will follow up with the 2nd monitor, but her 1st monitor has been uploaded for Dr. [name] to review.

## 2020-10-04 NOTE — Progress Notes (Signed)
Referring Physician(s): CODE STROKE  Supervising Physician: Corrie Mckusick  Patient Status:  [facility] - In-pt  Chief Complaint: Follow up right MCA M2 thrombectomy 10/02/20 in NIR  Subjective:  Patient sitting up in chair, calm, answers questions appropriately, follows commands, husband at bedside. She is asking to use the restroom and if she can have something to eat, otherwise denies any complaints.  Allergies: Alendronate and Penicillins  Medications: Prior to Admission medications   Medication Sig Start Date End Date Taking? Authorizing Provider  ALPRAZolam (XANAX) 0.25 MG tablet Take 0.25 mg by mouth 2 (two) times daily as needed for anxiety.   Yes [provider]  amLODipine (NORVASC) 10 MG tablet Take 10 mg by mouth every evening.    Yes [provider]  atorvastatin (LIPITOR) 80 MG tablet Take 80 mg by mouth daily.   Yes [provider]  buPROPion (WELLBUTRIN SR) 150 MG 12 hr tablet Take 1 tablet (150 mg total) by mouth 2 (two) times daily. 03/27/16  Yes Garvin Fila, MD  cholecalciferol (VITAMIN D3) 25 MCG (1000 UT) tablet Take 1,000 Units by mouth daily.   Yes [provider]  desvenlafaxine (PRISTIQ) 100 MG 24 hr tablet Take 100 mg by mouth daily. 12/05/19  Yes [provider]  ELIQUIS 5 MG TABS tablet TAKE 1 TABLET (5 MG TOTAL) BY MOUTH 2 (TWO) TIMES DAILY. Patient taking differently: Take 5 mg by mouth 2 (two) times daily.  02/23/20  Yes Deboraha Sprang, MD  ferrous sulfate 325 (65 FE) MG tablet Take 325 mg by mouth every evening.   Yes [provider]  fluticasone (FLONASE) 50 MCG/ACT nasal spray Place into both nostrils daily as needed for allergies or rhinitis.   Yes [provider]  levothyroxine (SYNTHROID, LEVOTHROID) 50 MCG tablet Take 50 mcg by mouth daily before breakfast.   Yes [provider]  losartan (COZAAR) 100 MG tablet Take 100 mg by mouth daily.    Yes [provider]   metFORMIN (GLUCOPHAGE) 1000 MG tablet Take 1,000 mg by mouth daily with breakfast.    Yes [provider]  pantoprazole (PROTONIX) 40 MG tablet Take 40 mg by mouth 2 (two) times daily. Reported on 04/17/2016   Yes [provider]  traZODone (DESYREL) 50 MG tablet Take 50 mg by mouth at bedtime as needed for sleep.    Yes [provider]  vitamin B-12 (CYANOCOBALAMIN) 500 MCG tablet Take 500 mcg by mouth every evening.    Yes [provider]     Vital Signs: BP (!) 164/136   Pulse 76   Temp 98.8 F (37.1 C) (Oral)   Resp (!) 21   Ht 5\' 5"  (1.651 m)   Wt 134 lb 11.2 oz (61.1 kg)   SpO2 99%   BMI 22.42 kg/m   Physical Exam Vitals and nursing note reviewed.  Constitutional:      General: She is not in acute distress. HENT:     Head: Normocephalic.  Cardiovascular:     Rate and Rhythm: Normal rate.     Comments: (+) Right CFA puncture site clean, dry, dressed appropriately. Non tender, no active bleeding or drainage. Pulmonary:     Effort: Pulmonary effort is normal.  Skin:    General: Skin is warm and dry.  Neurological:     Mental Status: She is alert.     Comments: Calm, follows commands appropriately, moving right side spontaneously, answers questions appropriately.     Imaging:  CT Code Stroke CTA Head W/WO contrast  Result Date: 10/02/2020 CLINICAL DATA:  Left-sided weakness. EXAM: CT ANGIOGRAPHY HEAD AND NECK TECHNIQUE: Multidetector CT imaging of the head and neck was performed using the standard protocol during bolus administration of intravenous contrast. Multiplanar CT image reconstructions and MIPs were obtained to evaluate the vascular anatomy. Carotid stenosis measurements (when applicable) are obtained utilizing NASCET criteria, using the distal internal carotid diameter as the denominator. CONTRAST:  141mL OMNIPAQUE IOHEXOL 350 MG/ML SOLN COMPARISON:  Head and neck MRA 11/11/2019 FINDINGS: CTA NECK FINDINGS Aortic arch: Normal  variant aortic arch branching pattern with common origin of the brachiocephalic and left common carotid arteries. Mild arch atherosclerosis without arch vessel origin stenosis. Right carotid system: Patent with minimal calcified plaque in the proximal ICA. No evidence of stenosis or dissection. Tortuous proximal common carotid artery. Left carotid system: Patent with minimal soft and calcified plaque in the proximal ICA. No evidence of stenosis or dissection. Tortuous proximal common carotid artery. Vertebral arteries: Patent with mild scattered plaque bilaterally. No evidence of stenosis or dissection. Slightly dominant left vertebral artery. Skeleton: Focally advanced disc degeneration at C4-5. Other neck: No evidence of cervical lymphadenopathy or mass. Upper chest: Mild centrilobular emphysema. Review of the MIP images confirms the above findings CTA HEAD FINDINGS Anterior circulation: The internal carotid arteries are patent from skull base to carotid termini with mild calcified plaque bilaterally not resulting in a significant stenosis. The right M1 segment is widely patent, however there is occlusion of the right M2 inferior division less than 1 cm beyond the MCA bifurcation. The left MCA and both ACAs are patent. There is an unchanged severe proximal left A1 stenosis. No aneurysm is identified. Posterior circulation: The intracranial vertebral arteries are widely patent to the basilar. Patent right PICA, left AICA, and bilateral SCA origins are identified. The basilar artery is widely patent. Posterior communicating arteries are diminutive or absent. The PCAs are patent without evidence of a significant proximal stenosis on the left. There is an unchanged moderate proximal right P2 stenosis. No aneurysm is identified. Venous sinuses: Not well evaluated due to arterial contrast timing. Anatomic variants: None. Review of the MIP images confirms the above findings IMPRESSION: 1. Emergent proximal right M2  occlusion. 2. Mild carotid and vertebral atherosclerosis without significant stenosis. 3.  Aortic Atherosclerosis (ICD10-I70.0). These results were communicated to Dr. Leonel Ramsay at 7:23 pm on 10/02/2020 by text page via the [facility] messaging system. Electronically Signed   By: Logan Bores M.D.   On: 10/02/2020 19:44   CT HEAD WO CONTRAST  Result Date: 10/03/2020 CLINICAL DATA:  Altered mental status. EXAM: CT HEAD WITHOUT CONTRAST TECHNIQUE: Contiguous axial images were obtained from the base of the skull through the vertex without intravenous contrast. COMPARISON:  CT head October 02, 2020. IMPRESSION: Nondiagnostic evaluation due to patient intolerance and motion. The technologist was unable to perform a repeat exam due to safety concern. Electronically Signed   By: Margaretha Sheffield MD   On: 10/03/2020 13:24   CT Code Stroke CTA Neck W/WO contrast  Result Date: 10/02/2020 CLINICAL DATA:  Left-sided weakness. EXAM: CT ANGIOGRAPHY HEAD AND NECK TECHNIQUE: Multidetector CT imaging of the head and neck was performed using the standard protocol during bolus administration of intravenous contrast. Multiplanar CT image reconstructions and MIPs were obtained to evaluate the vascular anatomy. Carotid stenosis measurements (when applicable) are obtained utilizing NASCET criteria, using the distal internal carotid diameter as the denominator. CONTRAST:  145mL OMNIPAQUE IOHEXOL 350 MG/ML  SOLN COMPARISON:  Head and neck MRA 11/11/2019 FINDINGS: CTA NECK FINDINGS Aortic arch: Normal variant aortic arch branching pattern with common origin of the brachiocephalic and left common carotid arteries. Mild arch atherosclerosis without arch vessel origin stenosis. Right carotid system: Patent with minimal calcified plaque in the proximal ICA. No evidence of stenosis or dissection. Tortuous proximal common carotid artery. Left carotid system: Patent with minimal soft and calcified plaque in the proximal ICA. No evidence of  stenosis or dissection. Tortuous proximal common carotid artery. Vertebral arteries: Patent with mild scattered plaque bilaterally. No evidence of stenosis or dissection. Slightly dominant left vertebral artery. Skeleton: Focally advanced disc degeneration at C4-5. Other neck: No evidence of cervical lymphadenopathy or mass. Upper chest: Mild centrilobular emphysema. Review of the MIP images confirms the above findings CTA HEAD FINDINGS Anterior circulation: The internal carotid arteries are patent from skull base to carotid termini with mild calcified plaque bilaterally not resulting in a significant stenosis. The right M1 segment is widely patent, however there is occlusion of the right M2 inferior division less than 1 cm beyond the MCA bifurcation. The left MCA and both ACAs are patent. There is an unchanged severe proximal left A1 stenosis. No aneurysm is identified. Posterior circulation: The intracranial vertebral arteries are widely patent to the basilar. Patent right PICA, left AICA, and bilateral SCA origins are identified. The basilar artery is widely patent. Posterior communicating arteries are diminutive or absent. The PCAs are patent without evidence of a significant proximal stenosis on the left. There is an unchanged moderate proximal right P2 stenosis. No aneurysm is identified. Venous sinuses: Not well evaluated due to arterial contrast timing. Anatomic variants: None. Review of the MIP images confirms the above findings IMPRESSION: 1. Emergent proximal right M2 occlusion. 2. Mild carotid and vertebral atherosclerosis without significant stenosis. 3.  Aortic Atherosclerosis (ICD10-I70.0). These results were communicated to Dr. Leonel Ramsay at 7:23 pm on 10/02/2020 by text page via the [facility] messaging system. Electronically Signed   By: Logan Bores M.D.   On: 10/02/2020 19:44   IR CT Head Ltd  Result Date: 10/04/2020 INDICATION: 74 year old female with a history of acute stroke with left-sided  symptoms, presents for attempt at thrombectomy EXAM: ULTRASOUND-GUIDED ACCESS RIGHT COMMON FEMORAL ARTERY CERVICAL AND CEREBRAL ANGIOGRAM MECHANICAL THROMBECTOMY RIGHT MCA TERRITORY ANGIO-SEAL FOR HEMOSTASIS COMPARISON:  CT imaging of the same day MEDICATIONS: None ANESTHESIA/SEDATION: The anesthesia team was present to provide general endotracheal tube anesthesia and for patient monitoring during the procedure. Intubation was performed in negative pressure Bay in neuro IR holding. Left radial arterial line was performed by the anesthesia team. Interventional neuro radiology nursing staff was also present. CONTRAST:  110 cc FLUOROSCOPY TIME:  Fluoroscopy Time: 22 minutes 36 seconds (841 mGy). COMPLICATIONS: SIR LEVEL B - Normal therapy, includes overnight admission for observation., Subarachnoid hemorrhage TECHNIQUE: Informed written consent was obtained from the patient's family after a thorough discussion of the procedural risks, benefits and alternatives. Specific risks discussed include: Bleeding, infection, contrast reaction, kidney injury/failure, need for further procedure/surgery, arterial injury or dissection, embolization to new territory, intracranial hemorrhage (10-15% risk), neurologic deterioration, cardiopulmonary collapse, death. All questions were addressed. Maximal Sterile Barrier Technique was utilized including during the procedure including caps, mask, sterile gowns, sterile gloves, sterile drape, hand hygiene and skin antiseptic. A timeout was performed prior to the initiation of the procedure. The anesthesia team was present to provide general endotracheal tube anesthesia and for patient monitoring during the procedure. Interventional neuro radiology nursing staff was  also present. FINDINGS: Initial Findings: Right common carotid artery:  Normal course caliber and contour. Right external carotid artery: Patent with antegrade flow. Right internal carotid artery: Normal course caliber and  contour of the cervical portion. Vertical and petrous segment patent with normal course caliber contour. Cavernous segment patent. Clinoid segment patent. Antegrade flow of the ophthalmic artery. Ophthalmic segment patent. Terminus patent. Right MCA: M1 segment patent. The M1 bifurcates to superior and inferior divisions at the insula, occlusion of the inferior division just after the branch point. Inferior division is the target: TICI 0: No perfusion or antegrade flow beyond site of occlusion Right ACA: A 1 segment patent. The A2 segment perfuses both the right and left ACA territory, with apparent azygos configuration. Completion Findings: Right MCA: M1 patent. No embolism new territory. Restoration of flow through the artery of interest, inferior division TICI 3: Complete perfusion of the territory PROCEDURE: The anesthesia team was present to provide general endotracheal tube anesthesia and for patient monitoring during the procedure. Intubation was performed in negative pressure Bay in neuro IR holding. Interventional neuro radiology nursing staff was also present. Ultrasound survey of the right inguinal region was performed with images stored and sent to PACs. 11 blade scalpel was used to make a small incision. Blunt dissection was performed with US guidance. A micropuncture needle was used access the right common femoral artery under ultrasound. With excellent arterial blood flow returned, an .018 micro wire was passed through the needle, observed to enter the abdominal aorta under fluoroscopy. The needle was removed, and a micropuncture sheath was placed over the wire. The inner dilator and wire were removed, and an 035 wire was advanced under fluoroscopy into the abdominal aorta. The sheath was removed and a 25cm 31F straight vascular sheath was placed. The dilator was removed and the sheath was flushed. Sheath was attached to pressurized and heparinized saline bag for constant forward flow. A coaxial system  was then advanced over the 035 wire. This included a 95cm 087 "Walrus" balloon guide with coaxial 125cm Berenstein diagnostic catheter. This was advanced to the proximal descending thoracic aorta. Wire was then removed. Double flush of the catheter was performed. Catheter was then used to select the innominate artery. Angiogram was performed. Using roadmap technique, the catheter was advanced over a standard glide wire into right cervical ICA, with distal position achieved of the balloon guide. The diagnostic catheter and the wire were removed. Formal angiogram was performed. Road map function was used once the occluded vessel was identified. Copious back flush was performed and the balloon catheter was attached to heparinized and pressurized saline bag for forward flow. A second coaxial system was then advanced through the balloon catheter, which included the selected intermediate catheter, microcatheter, and microwire. In this scenario, the set up included a 132cm CAT-6 intermediate catheter, a Trevo Provue18 microcatheter, and 014 synchro standard wire. This system was advanced through the balloon guide catheter under the road-map function, with adequate back-flush at the rotating hemostatic valve at that back end of the balloon guide. . Microcatheter and the intermediate catheter system were advanced through the terminal ICA and MCA to the level of the occlusion. The micro wire was then carefully advanced through the occluded segment. Microcatheter was then manipulated through the occluded segment and the wire was removed with saline drip at the hub. Blood was then aspirated through the hub of the microcatheter, and a gentle contrast injection was performed confirming intraluminal position. A rotating hemostatic valve was then attached  to the back end of the microcatheter, and a pressurized and heparinized saline bag was attached to the catheter. 28mm Trevo device was then selected. Back flush was achieved at the  rotating hemostatic valve, and then the device was gently advanced through the microcatheter to the distal end. The retriever was then unsheathed by withdrawing the microcatheter under fluoroscopy. Once the retriever was completely unsheathed, the microcatheter was carefully stripped from the delivery device. Control angiogram was performed from the intermediate catheter. Occlusion persisted at the inferior division. We elected to attempt a second pass. On the second pass, slight alteration was performed with the second coaxial system. In this next scenario, the coaxial set up included a 132cm CAT-5 intermediate catheter, a Trevo Provue18 microcatheter, and 014 synchro standard wire. This system was advanced through the balloon guide catheter under the road-map function, with adequate back-flush at the rotating hemostatic valve at that back end of the balloon guide. Microcatheter and the intermediate catheter system were advanced through the terminal ICA and MCA to the level of the occlusion. The micro wire was then carefully advanced through the occluded segment. Microcatheter was then manipulated through the occluded segment and the wire was removed with saline drip at the hub. On this attempt, the microcatheter and microwire were recognized to enter a more cephalad branch, the angular artery rather than the position on the first attempt, which was a more caudal branch. Blood was then aspirated through the hub of the microcatheter, and a gentle contrast injection was performed confirming intraluminal position. A rotating hemostatic valve was then attached to the back end of the microcatheter, and a pressurized and heparinized saline bag was attached to the catheter. 37mm Trevo device was then again used. Back flush was achieved at the rotating hemostatic valve, and then the device was gently advanced through the microcatheter to the distal end. The retriever was then unsheathed by withdrawing the microcatheter  under fluoroscopy. Once the retriever was completely unsheathed, the microcatheter was carefully stripped from the delivery device. Control angiogram was performed from the intermediate catheter. A 3 minute time interval was observed. The balloon at the balloon guide catheter was then inflated under fluoroscopy for proximal flow arrest. Constant aspiration using the proprietary engine was then performed at the intermediate catheter, as the retriever was gently and slowly withdrawn with fluoroscopic observation. Once the retriever was "corked" within the tip of the intermediate catheter, both were removed from the system. Free aspiration was confirmed at the hub of the balloon guide catheter, with free blood return confirmed. The balloon was then deflated, and a control angiogram was performed. Occlusion persisted within the inferior division, with TICI 0 flow. We elected to attempt a third pass. In this third pass scenario we again altered the coaxial system. The set up included a 137cm zoom 71 intermediate catheter, a Trevo Provue18 microcatheter, and 014 synchro standard wire. This system was advanced through the balloon guide catheter under the road-map function, with adequate back-flush at the rotating hemostatic valve at that back end of the balloon guide. Microcatheter and the intermediate catheter system were advanced through the terminal ICA and MCA to the level of the occlusion. The micro wire was then carefully advanced through the occluded segment. Microcatheter was then manipulated through the occluded segment and the wire was removed with saline drip at the hub. The wire again entered the angular branch on this occasion. Blood was then aspirated through the hub of the microcatheter, and a gentle contrast injection was performed  confirming intraluminal position. A rotating hemostatic valve was then attached to the back end of the microcatheter, and a pressurized and heparinized saline bag was attached to  the catheter. On this attempt, a 4 mm solitaire device was selected. Back flush was achieved at the rotating hemostatic valve, and then the device was gently advanced through the microcatheter to the distal end. The retriever was then unsheathed by withdrawing the microcatheter under fluoroscopy. Once the retriever was completely unsheathed, the microcatheter was carefully stripped from the delivery device. Control angiogram was performed from the intermediate catheter. A 3 minute time interval was observed. The balloon at the balloon guide catheter was then inflated under fluoroscopy for proximal flow arrest. Constant aspiration using the proprietary engine was then performed at the intermediate catheter, as the retriever was gently and slowly withdrawn with fluoroscopic observation. Once the retriever was "corked" within the tip of the intermediate catheter, both were removed from the system. Free aspiration was confirmed at the hub of the balloon guide catheter, with free blood return confirmed. The balloon was then deflated, and a control angiogram was performed. Restoration of flow was confirmed. Angiogram of the cervical ICA was performed. Balloon guide was then removed. The skin at the puncture site was then cleaned with Chlorhexidine. The 8 French sheath was removed and an 65F angioseal was deployed. Flat panel CT was performed. Patient was extubated once the CT was reviewed with neurology team. Patient tolerated the procedure well and remained hemodynamically stable throughout. Blood loss estimated 100 cc IMPRESSION: Status post ultrasound guided access right common femoral artery for cervical/cerebral angiogram and mechanical thrombectomy of right MCA M2 inferior branch division, achieving TICI 3 flow after 3 passes of combination therapy using stent retriever and local aspiration on each attempt. Small volume subarachnoid hemorrhage in the sylvian fissure at the completion of the exam. Angio-Seal for  hemostasis. Signed, Dulcy Fanny. Dellia Nims, [facility] Radiology PLAN: Patient was extubated. ICU status Target systolic blood pressure of 120-140 Right hip straight time 6 hours Frequent neurovascular checks Repeat neurologic imaging with CT and/MRI at the discretion of neurology team Electronically Signed   By: Corrie Mckusick D.O.   On: 10/04/2020 08:18   IR US Guide Vasc Access Right  Result Date: 10/04/2020 INDICATION: 74 year old female with a history of acute stroke with left-sided symptoms, presents for attempt at thrombectomy EXAM: ULTRASOUND-GUIDED ACCESS RIGHT COMMON FEMORAL ARTERY CERVICAL AND CEREBRAL ANGIOGRAM MECHANICAL THROMBECTOMY RIGHT MCA TERRITORY ANGIO-SEAL FOR HEMOSTASIS COMPARISON:  CT imaging of the same day MEDICATIONS: None ANESTHESIA/SEDATION: The anesthesia team was present to provide general endotracheal tube anesthesia and for patient monitoring during the procedure. Intubation was performed in negative pressure Bay in neuro IR holding. Left radial arterial line was performed by the anesthesia team. Interventional neuro radiology nursing staff was also present. CONTRAST:  110 cc FLUOROSCOPY TIME:  Fluoroscopy Time: 22 minutes 36 seconds (841 mGy). COMPLICATIONS: SIR LEVEL B - Normal therapy, includes overnight admission for observation., Subarachnoid hemorrhage TECHNIQUE: Informed written consent was obtained from the patient's family after a thorough discussion of the procedural risks, benefits and alternatives. Specific risks discussed include: Bleeding, infection, contrast reaction, kidney injury/failure, need for further procedure/surgery, arterial injury or dissection, embolization to new territory, intracranial hemorrhage (10-15% risk), neurologic deterioration, cardiopulmonary collapse, death. All questions were addressed. Maximal Sterile Barrier Technique was utilized including during the procedure including caps, mask,  sterile gowns, sterile gloves, sterile drape, hand hygiene and skin antiseptic. A timeout was  performed prior to the initiation of the procedure. The anesthesia team was present to provide general endotracheal tube anesthesia and for patient monitoring during the procedure. Interventional neuro radiology nursing staff was also present. FINDINGS: Initial Findings: Right common carotid artery:  Normal course caliber and contour. Right external carotid artery: Patent with antegrade flow. Right internal carotid artery: Normal course caliber and contour of the cervical portion. Vertical and petrous segment patent with normal course caliber contour. Cavernous segment patent. Clinoid segment patent. Antegrade flow of the ophthalmic artery. Ophthalmic segment patent. Terminus patent. Right MCA: M1 segment patent. The M1 bifurcates to superior and inferior divisions at the insula, occlusion of the inferior division just after the branch point. Inferior division is the target: TICI 0: No perfusion or antegrade flow beyond site of occlusion Right ACA: A 1 segment patent. The A2 segment perfuses both the right and left ACA territory, with apparent azygos configuration. Completion Findings: Right MCA: M1 patent. No embolism new territory. Restoration of flow through the artery of interest, inferior division TICI 3: Complete perfusion of the territory PROCEDURE: The anesthesia team was present to provide general endotracheal tube anesthesia and for patient monitoring during the procedure. Intubation was performed in negative pressure [place] in neuro IR holding. Interventional neuro radiology nursing staff was also present. Ultrasound survey of the right inguinal region was performed with images stored and sent to PACs. 11 blade scalpel was used to make a small incision. Blunt dissection was performed with US guidance. A micropuncture needle was used access the right common femoral artery under ultrasound. With excellent arterial  blood flow returned, an .018 micro wire was passed through the needle, observed to enter the abdominal aorta under fluoroscopy. The needle was removed, and a micropuncture sheath was placed over the wire. The inner dilator and wire were removed, and an 035 wire was advanced under fluoroscopy into the abdominal aorta. The sheath was removed and a 25cm 65F straight vascular sheath was placed. The dilator was removed and the sheath was flushed. Sheath was attached to pressurized and heparinized saline bag for constant forward flow. A coaxial system was then advanced over the 035 wire. This included a 95cm 087 "Walrus" balloon guide with coaxial 125cm Berenstein diagnostic catheter. This was advanced to the proximal descending thoracic aorta. Wire was then removed. Double flush of the catheter was performed. Catheter was then used to select the innominate artery. Angiogram was performed. Using roadmap technique, the catheter was advanced over a standard glide wire into right cervical ICA, with distal position achieved of the balloon guide. The diagnostic catheter and the wire were removed. Formal angiogram was performed. Road map function was used once the occluded vessel was identified. Copious back flush was performed and the balloon catheter was attached to heparinized and pressurized saline bag for forward flow. A second coaxial system was then advanced through the balloon catheter, which included the selected intermediate catheter, microcatheter, and microwire. In this scenario, the set up included a 132cm CAT-6 intermediate catheter, a Trevo Provue18 microcatheter, and 014 synchro standard wire. This system was advanced through the balloon guide catheter under the road-map function, with adequate back-flush at the rotating hemostatic valve at that back end of the balloon guide. . Microcatheter and the intermediate catheter system were advanced through the terminal ICA and MCA to the level of the occlusion. The  micro wire was then carefully advanced through the occluded segment. Microcatheter was then manipulated through the occluded segment and the wire was removed with  saline drip at the hub. Blood was then aspirated through the hub of the microcatheter, and a gentle contrast injection was performed confirming intraluminal position. A rotating hemostatic valve was then attached to the back end of the microcatheter, and a pressurized and heparinized saline bag was attached to the catheter. 45mm Trevo device was then selected. Back flush was achieved at the rotating hemostatic valve, and then the device was gently advanced through the microcatheter to the distal end. The retriever was then unsheathed by withdrawing the microcatheter under fluoroscopy. Once the retriever was completely unsheathed, the microcatheter was carefully stripped from the delivery device. Control angiogram was performed from the intermediate catheter. Occlusion persisted at the inferior division. We elected to attempt a second pass. On the second pass, slight alteration was performed with the second coaxial system. In this next scenario, the coaxial set up included a 132cm CAT-5 intermediate catheter, a Trevo Provue18 microcatheter, and 014 synchro standard wire. This system was advanced through the balloon guide catheter under the road-map function, with adequate back-flush at the rotating hemostatic valve at that back end of the balloon guide. Microcatheter and the intermediate catheter system were advanced through the terminal ICA and MCA to the level of the occlusion. The micro wire was then carefully advanced through the occluded segment. Microcatheter was then manipulated through the occluded segment and the wire was removed with saline drip at the hub. On this attempt, the microcatheter and microwire were recognized to enter a more cephalad branch, the angular artery rather than the position on the first attempt, which was a more caudal  branch. Blood was then aspirated through the hub of the microcatheter, and a gentle contrast injection was performed confirming intraluminal position. A rotating hemostatic valve was then attached to the back end of the microcatheter, and a pressurized and heparinized saline bag was attached to the catheter. 41mm Trevo device was then again used. Back flush was achieved at the rotating hemostatic valve, and then the device was gently advanced through the microcatheter to the distal end. The retriever was then unsheathed by withdrawing the microcatheter under fluoroscopy. Once the retriever was completely unsheathed, the microcatheter was carefully stripped from the delivery device. Control angiogram was performed from the intermediate catheter. A 3 minute time interval was observed. The balloon at the balloon guide catheter was then inflated under fluoroscopy for proximal flow arrest. Constant aspiration using the proprietary engine was then performed at the intermediate catheter, as the retriever was gently and slowly withdrawn with fluoroscopic observation. Once the retriever was "corked" within the tip of the intermediate catheter, both were removed from the system. Free aspiration was confirmed at the hub of the balloon guide catheter, with free blood return confirmed. The balloon was then deflated, and a control angiogram was performed. Occlusion persisted within the inferior division, with TICI 0 flow. We elected to attempt a third pass. In this third pass scenario we again altered the coaxial system. The set up included a 137cm zoom 71 intermediate catheter, a Trevo Provue18 microcatheter, and 014 synchro standard wire. This system was advanced through the balloon guide catheter under the road-map function, with adequate back-flush at the rotating hemostatic valve at that back end of the balloon guide. Microcatheter and the intermediate catheter system were advanced through the terminal ICA and MCA to the  level of the occlusion. The micro wire was then carefully advanced through the occluded segment. Microcatheter was then manipulated through the occluded segment and the wire was removed with  saline drip at the hub. The wire again entered the angular branch on this occasion. Blood was then aspirated through the hub of the microcatheter, and a gentle contrast injection was performed confirming intraluminal position. A rotating hemostatic valve was then attached to the back end of the microcatheter, and a pressurized and heparinized saline bag was attached to the catheter. On this attempt, a 4 mm solitaire device was selected. Back flush was achieved at the rotating hemostatic valve, and then the device was gently advanced through the microcatheter to the distal end. The retriever was then unsheathed by withdrawing the microcatheter under fluoroscopy. Once the retriever was completely unsheathed, the microcatheter was carefully stripped from the delivery device. Control angiogram was performed from the intermediate catheter. A 3 minute time interval was observed. The balloon at the balloon guide catheter was then inflated under fluoroscopy for proximal flow arrest. Constant aspiration using the proprietary engine was then performed at the intermediate catheter, as the retriever was gently and slowly withdrawn with fluoroscopic observation. Once the retriever was "corked" within the tip of the intermediate catheter, both were removed from the system. Free aspiration was confirmed at the hub of the balloon guide catheter, with free blood return confirmed. The balloon was then deflated, and a control angiogram was performed. Restoration of flow was confirmed. Angiogram of the cervical ICA was performed. Balloon guide was then removed. The skin at the puncture site was then cleaned with Chlorhexidine. The 8 French sheath was removed and an 62F angioseal was deployed. Flat panel CT was performed. Patient was extubated once  the CT was reviewed with neurology team. Patient tolerated the procedure well and remained hemodynamically stable throughout. Blood loss estimated 100 cc IMPRESSION: Status post ultrasound guided access right common femoral artery for cervical/cerebral angiogram and mechanical thrombectomy of right MCA M2 inferior branch division, achieving TICI 3 flow after 3 passes of combination therapy using stent retriever and local aspiration on each attempt. Small volume subarachnoid hemorrhage in the sylvian fissure at the completion of the exam. Angio-Seal for hemostasis. Signed, Dulcy Fanny. Dellia Nims, [place] [facility] Radiology PLAN: Patient was extubated. ICU status Target systolic blood pressure of 120-140 Right hip straight time 6 hours Frequent neurovascular checks Repeat neurologic imaging with CT and/MRI at the discretion of neurology team Electronically Signed   By: Corrie Mckusick D.O.   On: 10/04/2020 08:18   IR PERCUTANEOUS ART THROMBECTOMY/INFUSION INTRACRANIAL INC DIAG ANGIO  Result Date: 10/04/2020 INDICATION: 74 year old female with a history of acute stroke with left-sided symptoms, presents for attempt at thrombectomy EXAM: ULTRASOUND-GUIDED ACCESS RIGHT COMMON FEMORAL ARTERY CERVICAL AND CEREBRAL ANGIOGRAM MECHANICAL THROMBECTOMY RIGHT MCA TERRITORY ANGIO-SEAL FOR HEMOSTASIS COMPARISON:  CT imaging of the same day MEDICATIONS: None ANESTHESIA/SEDATION: The anesthesia team was present to provide general endotracheal tube anesthesia and for patient monitoring during the procedure. Intubation was performed in negative pressure [place] in neuro IR holding. Left radial arterial line was performed by the anesthesia team. Interventional neuro radiology nursing staff was also present. CONTRAST:  110 cc FLUOROSCOPY TIME:  Fluoroscopy Time: 22 minutes 36 seconds (841 mGy). COMPLICATIONS: SIR LEVEL B - Normal therapy, includes overnight admission for observation.,  Subarachnoid hemorrhage TECHNIQUE: Informed written consent was obtained from the patient's family after a thorough discussion of the procedural risks, benefits and alternatives. Specific risks discussed include: Bleeding, infection, contrast reaction, kidney injury/failure, need for further procedure/surgery, arterial injury or dissection, embolization to new territory, intracranial hemorrhage (10-15% risk), neurologic deterioration, cardiopulmonary  collapse, death. All questions were addressed. Maximal Sterile Barrier Technique was utilized including during the procedure including caps, mask, sterile gowns, sterile gloves, sterile drape, hand hygiene and skin antiseptic. A timeout was performed prior to the initiation of the procedure. The anesthesia team was present to provide general endotracheal tube anesthesia and for patient monitoring during the procedure. Interventional neuro radiology nursing staff was also present. FINDINGS: Initial Findings: Right common carotid artery:  Normal course caliber and contour. Right external carotid artery: Patent with antegrade flow. Right internal carotid artery: Normal course caliber and contour of the cervical portion. Vertical and petrous segment patent with normal course caliber contour. Cavernous segment patent. Clinoid segment patent. Antegrade flow of the ophthalmic artery. Ophthalmic segment patent. Terminus patent. Right MCA: M1 segment patent. The M1 bifurcates to superior and inferior divisions at the insula, occlusion of the inferior division just after the branch point. Inferior division is the target: TICI 0: No perfusion or antegrade flow beyond site of occlusion Right ACA: A 1 segment patent. The A2 segment perfuses both the right and left ACA territory, with apparent azygos configuration. Completion Findings: Right MCA: M1 patent. No embolism new territory. Restoration of flow through the artery of interest, inferior division TICI 3: Complete perfusion of  the territory PROCEDURE: The anesthesia team was present to provide general endotracheal tube anesthesia and for patient monitoring during the procedure. Intubation was performed in negative pressure [place] in neuro IR holding. Interventional neuro radiology nursing staff was also present. Ultrasound survey of the right inguinal region was performed with images stored and sent to PACs. 11 blade scalpel was used to make a small incision. Blunt dissection was performed with US guidance. A micropuncture needle was used access the right common femoral artery under ultrasound. With excellent arterial blood flow returned, an .018 micro wire was passed through the needle, observed to enter the abdominal aorta under fluoroscopy. The needle was removed, and a micropuncture sheath was placed over the wire. The inner dilator and wire were removed, and an 035 wire was advanced under fluoroscopy into the abdominal aorta. The sheath was removed and a 25cm 32F straight vascular sheath was placed. The dilator was removed and the sheath was flushed. Sheath was attached to pressurized and heparinized saline bag for constant forward flow. A coaxial system was then advanced over the 035 wire. This included a 95cm 087 "Walrus" balloon guide with coaxial 125cm Berenstein diagnostic catheter. This was advanced to the proximal descending thoracic aorta. Wire was then removed. Double flush of the catheter was performed. Catheter was then used to select the innominate artery. Angiogram was performed. Using roadmap technique, the catheter was advanced over a standard glide wire into right cervical ICA, with distal position achieved of the balloon guide. The diagnostic catheter and the wire were removed. Formal angiogram was performed. Road map function was used once the occluded vessel was identified. Copious back flush was performed and the balloon catheter was attached to heparinized and pressurized saline bag for forward flow. A second coaxial  system was then advanced through the balloon catheter, which included the selected intermediate catheter, microcatheter, and microwire. In this scenario, the set up included a 132cm CAT-6 intermediate catheter, a Trevo Provue18 microcatheter, and 014 synchro standard wire. This system was advanced through the balloon guide catheter under the road-map function, with adequate back-flush at the rotating hemostatic valve at that back end of the balloon guide. . Microcatheter and the intermediate catheter system were advanced through the terminal ICA  and MCA to the level of the occlusion. The micro wire was then carefully advanced through the occluded segment. Microcatheter was then manipulated through the occluded segment and the wire was removed with saline drip at the hub. Blood was then aspirated through the hub of the microcatheter, and a gentle contrast injection was performed confirming intraluminal position. A rotating hemostatic valve was then attached to the back end of the microcatheter, and a pressurized and heparinized saline bag was attached to the catheter. 20mm Trevo device was then selected. Back flush was achieved at the rotating hemostatic valve, and then the device was gently advanced through the microcatheter to the distal end. The retriever was then unsheathed by withdrawing the microcatheter under fluoroscopy. Once the retriever was completely unsheathed, the microcatheter was carefully stripped from the delivery device. Control angiogram was performed from the intermediate catheter. Occlusion persisted at the inferior division. We elected to attempt a second pass. On the second pass, slight alteration was performed with the second coaxial system. In this next scenario, the coaxial set up included a 132cm CAT-5 intermediate catheter, a Trevo Provue18 microcatheter, and 014 synchro standard wire. This system was advanced through the balloon guide catheter under the road-map function, with adequate  back-flush at the rotating hemostatic valve at that back end of the balloon guide. Microcatheter and the intermediate catheter system were advanced through the terminal ICA and MCA to the level of the occlusion. The micro wire was then carefully advanced through the occluded segment. Microcatheter was then manipulated through the occluded segment and the wire was removed with saline drip at the hub. On this attempt, the microcatheter and microwire were recognized to enter a more cephalad branch, the angular artery rather than the position on the first attempt, which was a more caudal branch. Blood was then aspirated through the hub of the microcatheter, and a gentle contrast injection was performed confirming intraluminal position. A rotating hemostatic valve was then attached to the back end of the microcatheter, and a pressurized and heparinized saline bag was attached to the catheter. 41mm Trevo device was then again used. Back flush was achieved at the rotating hemostatic valve, and then the device was gently advanced through the microcatheter to the distal end. The retriever was then unsheathed by withdrawing the microcatheter under fluoroscopy. Once the retriever was completely unsheathed, the microcatheter was carefully stripped from the delivery device. Control angiogram was performed from the intermediate catheter. A 3 minute time interval was observed. The balloon at the balloon guide catheter was then inflated under fluoroscopy for proximal flow arrest. Constant aspiration using the proprietary engine was then performed at the intermediate catheter, as the retriever was gently and slowly withdrawn with fluoroscopic observation. Once the retriever was "corked" within the tip of the intermediate catheter, both were removed from the system. Free aspiration was confirmed at the hub of the balloon guide catheter, with free blood return confirmed. The balloon was then deflated, and a control angiogram was  performed. Occlusion persisted within the inferior division, with TICI 0 flow. We elected to attempt a third pass. In this third pass scenario we again altered the coaxial system. The set up included a 137cm zoom 71 intermediate catheter, a Trevo Provue18 microcatheter, and 014 synchro standard wire. This system was advanced through the balloon guide catheter under the road-map function, with adequate back-flush at the rotating hemostatic valve at that back end of the balloon guide. Microcatheter and the intermediate catheter system were advanced through the terminal ICA  and MCA to the level of the occlusion. The micro wire was then carefully advanced through the occluded segment. Microcatheter was then manipulated through the occluded segment and the wire was removed with saline drip at the hub. The wire again entered the angular branch on this occasion. Blood was then aspirated through the hub of the microcatheter, and a gentle contrast injection was performed confirming intraluminal position. A rotating hemostatic valve was then attached to the back end of the microcatheter, and a pressurized and heparinized saline bag was attached to the catheter. On this attempt, a 4 mm solitaire device was selected. Back flush was achieved at the rotating hemostatic valve, and then the device was gently advanced through the microcatheter to the distal end. The retriever was then unsheathed by withdrawing the microcatheter under fluoroscopy. Once the retriever was completely unsheathed, the microcatheter was carefully stripped from the delivery device. Control angiogram was performed from the intermediate catheter. A 3 minute time interval was observed. The balloon at the balloon guide catheter was then inflated under fluoroscopy for proximal flow arrest. Constant aspiration using the proprietary engine was then performed at the intermediate catheter, as the retriever was gently and slowly withdrawn with fluoroscopic  observation. Once the retriever was "corked" within the tip of the intermediate catheter, both were removed from the system. Free aspiration was confirmed at the hub of the balloon guide catheter, with free blood return confirmed. The balloon was then deflated, and a control angiogram was performed. Restoration of flow was confirmed. Angiogram of the cervical ICA was performed. Balloon guide was then removed. The skin at the puncture site was then cleaned with Chlorhexidine. The 8 French sheath was removed and an 42F angioseal was deployed. Flat panel CT was performed. Patient was extubated once the CT was reviewed with neurology team. Patient tolerated the procedure well and remained hemodynamically stable throughout. Blood loss estimated 100 cc IMPRESSION: Status post ultrasound guided access right common femoral artery for cervical/cerebral angiogram and mechanical thrombectomy of right MCA M2 inferior branch division, achieving TICI 3 flow after 3 passes of combination therapy using stent retriever and local aspiration on each attempt. Small volume subarachnoid hemorrhage in the sylvian fissure at the completion of the exam. Angio-Seal for hemostasis. Signed, Dulcy Fanny. Dellia Nims, [place] [facility] Rad[facility] PLAN: Patient was extubated. ICU status Target systolic blood pressure of 120-140 Right hip straight time 6 hours Frequent neurovascular checks Repeat neurologic imaging with CT and/MRI at the discretion of neurology team Electronically Signed   By: Corrie Mckusick D.O.   On: 10/04/2020 08:18   CT HEAD CODE STROKE WO CONTRAST  Result Date: 10/02/2020 CLINICAL DATA:  Code stroke.  Left-sided weakness. EXAM: CT HEAD WITHOUT CONTRAST TECHNIQUE: Contiguous axial images were obtained from the base of the skull through the vertex without intravenous contrast. COMPARISON:  04/02/2020 FINDINGS: Brain: There is no evidence of an acute infarct, intracranial hemorrhage,  mass, midline shift, or extra-axial fluid collection. 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 Chronic lacunar infarcts in the right corona radiata and cerebellum are also unchanged. The ventricles are normal in size. Vascular: Calcified atherosclerosis at the skull base. Hyperdense right MCA in the region of the bifurcation. Skull: No fracture or suspicious osseous lesion. Sinuses/Orbits: Prior left-sided functional endoscopic sinus surgery. Paranasal sinuses and mastoid air cells are clear. Bilateral cataract extraction. Rightward gaze. Other: None. ASPECTS [facility] Stroke Program Early CT Score) - Ganglionic level infarction (caudate, lentiform nuclei,  internal capsule, insula, M1-M3 cortex): 7 - Supraganglionic infarction (M4-M6 cortex): 3 Total score (0-10 with 10 being normal): 10 IMPRESSION: 1. No acute infarct or intracranial hemorrhage identified. 2. ASPECTS is 10. 3. Hyperdense right MCA which will be evaluated on pending CTA. 4. Multiple chronic infarcts. These results were communicated to Dr. Leonel Ramsay at 7:23 pm on 10/02/2020 by text page via the [facility] messaging system. Electronically Signed   By: Logan Bores M.D.   On: 10/02/2020 19:26    Labs:  CBC: Recent Labs    02/04/20 1121 02/04/20 1121 10/02/20 1903 10/02/20 1909 10/03/20 0734 10/04/20 0118  WBC 6.3  --  9.6  --  9.7 11.5*  HGB 11.3*   < > 11.7* 12.9 9.7* 10.3*  HCT 34.6*   < > 36.6 38.0 28.6* 30.5*  PLT 244  --  227  --  215 232   < > = values in this interval not displayed.    COAGS: Recent Labs    11/10/19 0828 10/02/20 1903  INR 0.9 0.9  APTT 30 26    BMP: Recent Labs    11/11/19 0541 11/11/19 0541 11/12/19 0402 11/12/19 0402 12/14/19 1105 12/14/19 1105 02/04/20 1121 02/04/20 1121 10/02/20 1903 10/02/20 1909 10/03/20 0734 10/04/20 0118  NA 140   < > 140   < > 137   < > 139   < > 136 139 140 139  K 4.0   < > 3.6   < > 4.2   < > 4.6   < > 4.4 4.3 4.2  3.7  CL 107   < > 109   < > 105   < > 109   < > 108 108 112* 109  CO2 22   < > 24   < > 20*   < > 21*  --  15*  --  17* 17*  GLUCOSE 75   < > 106*   < > 218*   < > 177*   < > 178* 174* 85 99  BUN 15   < > 14   < > 14   < > 19   < > 18 19 14 11   CALCIUM 9.2   < > 9.2   < > 9.3   < > 9.4  --  9.7  --  8.5* 8.8*  CREATININE 1.23*   < > 1.22*   < > 1.42*   < > 1.59*   < > 1.60* 1.30* 1.46* 1.28*  GFRNONAA 43*   < > 44*   < > 37*   < > 32*  --  34*  --  38* 44*  GFRAA 50*  --  51*  --  42*  --  37*  --   --   --   --   --    < > = values in this interval not displayed.    LIVER FUNCTION TESTS: Recent Labs    11/10/19 0828 12/14/19 1105 10/02/20 1903  BILITOT 0.3 0.4 0.3  AST 18 20 18   ALT 13 15 15   ALKPHOS 68 70 70  PROT 7.3 7.4 6.9  ALBUMIN 3.7 3.6 3.6    Assessment and Plan:  74 y/o F who presented to [facility] ED on 10/02/20 as a Code Stroke s/p right MCA M2 thrombectomy in NIR with Dr. Earleen Newport that same day seen today for routine follow up.  Patient calm on exam today (noted to be agitated yesterday per chart), sitting up in chair, answers  questions/interacts appropriately. Spontaneously moves right side as previously noted. Right CFA puncture site unremarkable, non tender, non pulsatile.  Continue routine wound care of right CFA puncture site until healed, do not submerge x 7 days. Further plans per neurology/primary service - IR remains available as needed, please call with questions or concerns.  Electronically Signed: Joaquim Nam, PA-C 10/04/2020, 9:43 AM   I spent a total of 15 Minutes at the the patient's bedside AND on the patient's hospital floor or unit, greater than 50% of which was counseling/coordinating care for right MCA M2 thrombectomy follow up.

## 2020-10-04 NOTE — CV Procedure (Signed)
2D echo attempted, patient was just put in chair. RN stated to try echo later.

## 2020-10-04 NOTE — Progress Notes (Signed)
Physical Therapy Treatment Patient Details Name: Erin Yates MRN: [id number] DOB: [birth year] Today's Date: 10/04/2020    History of Present Illness 74 y.o. female admitted on 10/02/20 for L sided weakness.  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  Pt with significant PMH of previous stroke with residual visual deficits (peripheral vision loss), but they do not hinder her function,PFO s/p closure, HTN, DM, anxiety, anemia, loop recorder placement.      PT Comments    The pt is making good progress with mobility and PT goals at this time. She continues to be limited by deficits in strength, stability, and endurance as well as significant anxiety that requires max cues and encouragement to improve pt performance. The pt was able to complete multiple OOB transfers and short bouts of gait in her room with minA of 1-2 for powering up to standing and for stability with gait. The pt will continue to benefit from skilled PT to progress functional strength, stability, and independence with mobility prior to and following d/c to maximize return to prior level of independence and mobility.     Follow Up Recommendations  CIR     Equipment Recommendations  None recommended by PT    Recommendations for Other Services       Precautions / Restrictions Precautions Precautions: Fall Precaution Comments: impulsive, L inattention, L weakness, SBP <180 Restrictions Weight Bearing Restrictions: No    Mobility  Bed Mobility Overal bed mobility: Needs Assistance Bed Mobility: Supine to Sit     Supine to sit: HOB elevated;Supervision     General bed mobility comments: pt able to come to long sitting in bed without any assist, then scooted to EOB once bed rail lowered by staff  Transfers Overall transfer level: Needs assistance Equipment used: 2 person hand held assist Transfers: Sit to/from Stand Sit to Stand: Min assist;+2 safety/equipment          General transfer comment: minA of 2 for pt comfort and stability.  Ambulation/Gait Ambulation/Gait assistance: Min assist;+2 safety/equipment Gait Distance (Feet): 15 Feet (x2) Assistive device: 2 person hand held assist;1 person hand held assist Gait Pattern/deviations: Step-through pattern;Decreased stride length;Shuffle Gait velocity: decreased Gait velocity interpretation: <1.31 ft/sec, indicative of household ambulator General Gait Details: initially mina of 2, but able to progress to mina of 1 with +2 managing lines/IV pole.    Modified Rankin (Stroke Patients Only) Modified Rankin (Stroke Patients Only) Pre-Morbid Rankin Score: Slight disability Modified Rankin: Moderately severe disability     Balance Overall balance assessment: Needs assistance Sitting-balance support: Feet unsupported;No upper extremity supported;Bilateral upper extremity supported Sitting balance-Leahy Scale: Fair Sitting balance - Comments: able to sit EOB with supervision, however, did not challenge her to reach outside of BOS.    Standing balance support: Single extremity supported Standing balance-Leahy Scale: Poor Standing balance comment: reliant on UE support                            Cognition Arousal/Alertness: Awake/alert Behavior During Therapy: Restless;Impulsive;Anxious Overall Cognitive Status: Impaired/Different from baseline Area of Impairment: Attention;Memory;Following commands;Safety/judgement;Awareness;Problem solving                   Current Attention Level: Sustained Memory: Decreased recall of precautions;Decreased short-term memory Following Commands: Follows one step commands with increased time;Follows one step commands inconsistently Safety/Judgement: Decreased awareness of safety;Decreased awareness of deficits Awareness: Intellectual Problem Solving: Slow processing;Difficulty sequencing;Requires  verbal cues;Requires tactile cues General  Comments: Pt benefits from repeated commands at times and max encouragement/calming due to anxiety but the pt is redirectable and pleasant. Decreased safety awareness.       Exercises      General Comments General comments (skin integrity, edema, etc.): VSS on RA, pt reports she is very cold, and shaking due to cold, given 2 warm blankets      Pertinent Vitals/Pain Pain Assessment: Faces Faces Pain Scale: Hurts a little bit Pain Location: general with mobility Pain Descriptors / Indicators: Grimacing Pain Intervention(s): Limited activity within patient's tolerance;Monitored during session;Repositioned           PT Goals (current goals can now be found in the care plan section) Acute Rehab PT Goals Patient Stated Goal: to go home before her granddaughter leaves for [place] PT Goal Formulation: With patient/family Time For Goal Achievement: 10/17/20 Potential to Achieve Goals: Good Progress towards PT goals: Progressing toward goals    Frequency    Min 4X/week      PT Plan Current plan remains appropriate    Co-evaluation PT/OT/SLP Co-Evaluation/Treatment: Yes Reason for Co-Treatment: Necessary to address cognition/behavior during functional activity;For patient/therapist safety;To address functional/ADL transfers PT goals addressed during session: Mobility/safety with mobility;Balance;Proper use of DME        AM-PAC PT "6 Clicks" Mobility   Outcome Measure  Help needed turning from your back to your side while in a flat bed without using bedrails?: A Little Help needed moving from lying on your back to sitting on the side of a flat bed without using bedrails?: A Little Help needed moving to and from a bed to a chair (including a wheelchair)?: A Little Help needed standing up from a chair using your arms (e.g., wheelchair or bedside chair)?: A Lot Help needed to walk in hospital room?: A Lot Help needed climbing 3-5 steps with a railing? : Total 6 Click Score: 14     End of Session Equipment Utilized During Treatment: Gait belt Activity Tolerance: Patient tolerated treatment well Patient left: in chair;with chair alarm set;with family/visitor present Nurse Communication: Mobility status PT Visit Diagnosis: Muscle weakness (generalized) (M62.81);Difficulty in walking, not elsewhere classified (R26.2);Hemiplegia and hemiparesis Hemiplegia - Right/Left: Left Hemiplegia - dominant/non-dominant: Non-dominant Hemiplegia - caused by: Cerebral infarction     Time: 0938-1829 PT Time Calculation (min) (ACUTE ONLY): 23 min  Charges:  $Gait Training: 8-22 mins                     Karma Ganja, PT, DPT   Acute Rehabilitation Department Pager #: [phone]   Otho Bellows 10/04/2020, 1:48 PM

## 2020-10-04 NOTE — Progress Notes (Signed)
ECG looks good. Troponin mildly up, may be due to storke itself vs demand ischemia. Will get repeat to ensure not uptrending.   Roland Rack, MD [facility] [phone]  If 7pm- 7am, please page neurology on call as listed in [place].

## 2020-10-04 NOTE — Progress Notes (Signed)
Pt calling out, "im not doing well, something's not right." When asked what was wrong pt only said "everything, Im shaking all over, and my heart". Immediate EKG and CBG obtained. CBG 119, EKG results in chart. Notified Dr. Leonel Ramsay.  STAT troponin order placed.

## 2020-10-04 NOTE — Progress Notes (Signed)
STROKE TEAM PROGRESS NOTE   INTERVAL HISTORY Husband and RN are at bedside.  Patient still has intermittent agitation, confusion, delirium, and restless.  Again not able to have good quality CT head, however it showed large right MCA stroke with sylvian fissure small SAH.  Has to have restrain, Ativan and Haldol as needed, put on Seroquel p.o.  May consider Precedex if needed.  OBJECTIVE Vitals:   10/04/20 1100 10/04/20 1200 10/04/20 1300 10/04/20 1322  BP: (!) 118/107 (!) 159/63 (!) 182/86 (!) 174/79  Pulse: 74 68 90 (!) 102  Resp: 18 (!) 21 (!) 21 (!) 30  Temp:  99 F (37.2 C)    TempSrc:  Oral    SpO2: 97% 98% 97% 99%  Weight:      Height:       CBC:  Recent Labs  Lab 10/02/20 1903 10/02/20 1909 10/03/20 0734 10/04/20 0118  WBC 9.6   < > 9.7 11.5*  NEUTROABS 6.0  --   --   --   HGB 11.7*   < > 9.7* 10.3*  HCT 36.6   < > 28.6* 30.5*  MCV 99.7   < > 95.7 94.7  PLT 227   < > 215 232   < > = values in this interval not displayed.   Basic Metabolic Panel:  Recent Labs  Lab 10/03/20 0734 10/04/20 0118  NA 140 139  K 4.2 3.7  CL 112* 109  CO2 17* 17*  GLUCOSE 85 99  BUN 14 11  CREATININE 1.46* 1.28*  CALCIUM 8.5* 8.8*   Lipid Panel:     Component Value Date/Time   CHOL 115 10/03/2020 0105   TRIG 442 (H) 10/03/2020 0105   HDL 35 (L) 10/03/2020 0105   CHOLHDL 3.3 10/03/2020 0105   VLDL UNABLE TO CALCULATE IF TRIGLYCERIDE OVER 400 mg/dL 10/03/2020 0105   LDLCALC UNABLE TO CALCULATE IF TRIGLYCERIDE OVER 400 mg/dL 10/03/2020 0105   HgbA1c:  Lab Results  Component Value Date   HGBA1C 6.2 (H) 10/03/2020   Urine Drug Screen: No results found for: LABOPIA, COCAINSCRNUR, LABBENZ, AMPHETMU, THCU, LABBARB  Alcohol Level     Component Value Date/Time   ETH <10 10/03/2020 0105    IMAGING  CT HEAD CODE STROKE WO CONTRAST 10/02/2020 1. No acute infarct or intracranial hemorrhage identified.  2. ASPECTS is 10.  3. Hyperdense right MCA which will be evaluated on  pending CTA.  4. Multiple chronic infarcts.   CT Code Stroke CTA Head W/WO contrast CT Code Stroke CTA Neck W/WO contrast 10/02/2020 1. Emergent proximal right M2 occlusion.  2. Mild carotid and vertebral atherosclerosis without significant stenosis.  3.  Aortic Atherosclerosis (ICD10-I70.0).   Cerebral Angio / IR 10/02/2020 R M2 occlusion (TICI0). 1st pass-Trevo 74mm & CAT 6 w/ aspiration. 2nd pass-Trevo 64mm & CAT 5 w/ aspiration. 3rd pass-Solitaire 65mm & Zoom 71 w/ aspiration. Final TICI3.   Post IR CT 10/02/2020 Small volume SAH at the sylvian fissure and right perisylvian sulci         CT HEAD WO CONTRAST 10/03/2020 Nondiagnostic evaluation due to patient intolerance and motion. The technologist was unable to perform a repeat exam due to safety Concern.  CT HEAD WO CONTRAST 10/04/2020  Severely limited study with findings consistent with an evolving posterior right MCA territory infarct with progressive edema. There is hyperdensity in the region of the right sylvian fissure, concerning for acute subarachnoid hemorrhage but suboptimally evaluated. There is sulcal effacement without substantial midline shift.  Transthoracic Echocardiogram  10/04/2020 1. Left ventricular ejection fraction, by estimation, is 60 to 65%. The left ventricle has normal function. The left ventricle has no regional wall motion abnormalities. 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 Left ventricular diastolic parameters are indeterminate.  2. Right ventricular systolic function is normal. The right ventricular size is normal. Tricuspid regurgitation signal is inadequate for assessing PA pressure.  3. The mitral valve is degenerative. Mild mitral valve regurgitation. Mild mitral stenosis. 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  4. The  aortic valve is tricuspid. Aortic valve regurgitation is not visualized. No aortic stenosis is present.  5. The inferior vena cava is normal in size with greater than 50% respiratory variability, suggesting right atrial pressure of 3 mmHg.   ECG - Sinus rhythm with Premature supraventricular complexes; Left anterior fascicular block; Minimal voltage criteria for LVH, may be normal variant ( R in aVL )  PHYSICAL EXAM  Temp:  [98.6 F (37 C)-100.9 F (38.3 C)] 99 F (37.2 C) (11/29 1200) Pulse Rate:  [56-102] 102 (11/29 1322) Resp:  [15-30] 30 (11/29 1322) BP: (100-182)/(55-136) 174/79 (11/29 1322) SpO2:  [91 %-99 %] 99 % (11/29 1322)  General - Well nourished, well developed, agitated and restless.  Ophthalmologic - fundi not visualized due to noncooperation.  Cardiovascular - Regular rhythm and rate with frequent PACs.  Neuro - awake, alert, agitated and restless, on restrain with posey belt and Right wrist and ankle restrain. Orientated to place and people but not to age or time. Saying she is 74 years old. No aphasia but paucity of speech, not cooperative with naming or repeating. Following simple commands but needed repeated request. B/l visual acuity decreased, able to see finger counting in the central vision, but poor visual acuity at peripheral bilaterally, but able to see hand waving on the right. No gaze palsy but right gaze preference and left hemianopia. Left facial droop, tongue protrusion not cooperative. Left UE 4-/5 with mild drift, RUE at least 4+/5. LLE 4+/5 and RLE 4/5 with just off the restrain with mild pain. Sensation, coordination not cooperative and gait not tested.   ASSESSMENT/PLAN [name] is a 74 y.o. female with history of diabetes, hypothyroidism, ongoing tobacco use, HTN, HLD, hx of PFO closure, implantable loop, hypertension, atrial fibrillation on eliquis as well as multiple previous strokes (residual visual deficits) who presents with left-sided  weakness, a fall, rightward gaze, and confusion. She did not receive IV t-PA due to anticoagulation with Eliquis. 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 However, pt does have multiple stroke risk factors.   CT Head -  No acute infarct or intracranial hemorrhage identified. ASPECTS is 10. Hyperdense right MCA. Multiple chronic infarcts.   CTA H&N - Emergent proximal right M2 occlusion. Mild carotid and vertebral atherosclerosis without significant stenosis.   IR TICI3 of right M2 occlusion, Small volume SAH at the sylvian fissure and right perisylvian sulci    CT head 11/29 an evolving posterior right MCA territory infarct with progressive edema. There is hyperdensity in the region of the right sylvian fissure  MRI and MRA head - hold off due to agitation  2D Echo - EF 60-65.%.  degenerative MV w/ echodensity base posterior leaflet c/w severe P3 prolapse  Lacey Jensen Virus 2 - negative  TG 442, LDL - 44  HgbA1c - 6.2  VTE prophylaxis - SCDs  Eliquis (apixaban) daily prior to admission, now on No antithrombotic due to small SAH.   Therapy recommendations:  pending  Disposition:  Pending  Hx stroke/TIA  09/2007 with RLE hp  11/2007 with LLE numbness  01/2008 probable stroke with RUE hp  07/2010 L occipital and R basal ganglia infarct  PFO diagnosed 2009, underwent closure 04/2009 by Dr. Jenne Pane at [facility]  02/2016 left hemianopia status post TPA.  MRI showed right parietal and temporal infarcts.  CT head and neck right P3 occlusion.  EF 60 to 65%.  TEE showed PFO closure device in place.  LDL 79 A1c 6.4.  Discharged with DAPT.  11/2019 admitted for aphasia.  MRI showed left parietal lobe infarct.  MRI high-grade stenosis left A1, moderate stenosis right P2. Loop recorder positive for A. fib at that time, put  on Eliquis.  Follows with Dr. Leonie Man at [facility]  PAF Tachypalpitation  Follow with Dr. [name] cardiology  Loop recorder showed subclinical A. Fib  On Eliquis, compliant per husband  On recent cardiac event monitoring with Dr. [name]  No AC at the time until [facility] resolves and 7-10 days post stroke  Agitation/confusion/delirium/vascular dementia  Likely due to bilateral acute and chronic infarcts  On four-point restraint  Not able to get MRI  EKG no QT prolongation  On haldol and ativan PRN  On Seroquel 25 bid  Resume home wellbutrin, effexor  May consider precedex if needed  Hypertension  Home BP meds: Norvasc ; Cozaar  Current BP meds: off Cleviprex   Resume norvasc 10  Stable  SBP goal < 160  . Long-term BP goal normotensive  Hyperlipidemia  Home Lipid lowering medication: Lipitor 80 mg daily   LDL 44, LDL goal < 70  Resume Lipitor once p.o. access  Continue statin at discharge  Diabetes  Home diabetic meds: metformin  Current diabetic meds: SSI   HgbA1c 6.2, goal < 7.0  CBG monitoring  PCP follow-up  Dysphagia  Failed bedside swallow screen due to coughing and water  Speech on board  Cleared for D2 Nectar thick liquids  On IV fluid @ 40cc  Tobacco abuse  Current smoker  Smoking cessation counseling provided  Nicotine patch provided  Pt is willing to quit  Other Stroke Risk Factors  Advanced age  Hx PFO s/p closure  Other Active Problems Code status - Full code  CKD - stage 3b - Cre- 1.60->1.30->1.28  Aortic Atherosclerosis (ICD10-I70.0).   Elevated Trop 569->804 EKG ok.   Hospital day # 2  This patient is critically ill due to right MCA stroke, SAH, history of stroke, agitated/confusion/delirium, hypertension with dysphagia, PAF and at significant risk of neurological worsening, death form recurrent stroke, hemorrhagic conversion, SAH extension, heart failure, sepsis, seizure, delirium. This patient's care requires  constant monitoring of vital signs, hemodynamics, respiratory and cardiac monitoring, review of multiple databases, neurological assessment, discussion with family, other specialists and medical decision making of high complexity. I spent 45 minutes of neurocritical care time in the care of this patient. I had long discussion with husband at bedside, updated pt current condition, treatment plan and potential prognosis, and answered all the questions.  He expressed understanding and appreciation.    Rosalin Hawking, MD PhD Stroke Neurology 10/04/2020 1:52 PM    To contact Stroke Continuity  provider, please refer to [email or web address] After hours, contact General Neurology

## 2020-10-05 DIAGNOSIS — I4821 Permanent atrial fibrillation: Secondary | ICD-10-CM

## 2020-10-05 DIAGNOSIS — I639 Cerebral infarction, unspecified: Secondary | ICD-10-CM | POA: Diagnosis not present

## 2020-10-05 DIAGNOSIS — I609 Nontraumatic subarachnoid hemorrhage, unspecified: Secondary | ICD-10-CM | POA: Diagnosis not present

## 2020-10-05 DIAGNOSIS — Z7901 Long term (current) use of anticoagulants: Secondary | ICD-10-CM

## 2020-10-05 DIAGNOSIS — Z8673 Personal history of transient ischemic attack (TIA), and cerebral infarction without residual deficits: Secondary | ICD-10-CM | POA: Diagnosis not present

## 2020-10-05 DIAGNOSIS — I63412 Cerebral infarction due to embolism of left middle cerebral artery: Secondary | ICD-10-CM | POA: Diagnosis not present

## 2020-10-05 LAB — BASIC METABOLIC PANEL
Anion gap: 15 (ref 5–15)
BUN: 11 mg/dL (ref 8–23)
CO2: 17 mmol/L — ABNORMAL LOW (ref 22–32)
Calcium: 8.9 mg/dL (ref 8.9–10.3)
Chloride: 108 mmol/L (ref 98–111)
Creatinine, Ser: 1.32 mg/dL — ABNORMAL HIGH (ref 0.44–1.00)
GFR, Estimated: 42 mL/min — ABNORMAL LOW (ref >60)
Glucose, Bld: 140 mg/dL — ABNORMAL HIGH (ref 70–99)
Potassium: 3.9 mmol/L (ref 3.5–5.1)
Sodium: 140 mmol/L (ref 135–145)

## 2020-10-05 LAB — CBC
HCT: 32.6 % — ABNORMAL LOW (ref 36.0–46.0)
Hemoglobin: 11.1 g/dL — ABNORMAL LOW (ref 12.0–15.0)
MCH: 32 pg (ref 26.0–34.0)
MCHC: 34 g/dL (ref 30.0–36.0)
MCV: 93.9 fL (ref 80.0–100.0)
Platelets: 255 10*3/uL (ref 150–400)
RBC: 3.47 MIL/uL — ABNORMAL LOW (ref 3.87–5.11)
RDW: 13 % (ref 11.5–15.5)
WBC: 13.2 10*3/uL — ABNORMAL HIGH (ref 4.0–10.5)
nRBC: 0 % (ref 0.0–0.2)

## 2020-10-05 LAB — GLUCOSE, CAPILLARY
Glucose-Capillary: 131 mg/dL — ABNORMAL HIGH (ref 70–99)
Glucose-Capillary: 132 mg/dL — ABNORMAL HIGH (ref 70–99)
Glucose-Capillary: 135 mg/dL — ABNORMAL HIGH (ref 70–99)
Glucose-Capillary: 142 mg/dL — ABNORMAL HIGH (ref 70–99)
Glucose-Capillary: 173 mg/dL — ABNORMAL HIGH (ref 70–99)
Glucose-Capillary: 194 mg/dL — ABNORMAL HIGH (ref 70–99)

## 2020-10-05 LAB — MAGNESIUM: Magnesium: 1 mg/dL — ABNORMAL LOW (ref 1.7–2.4)

## 2020-10-05 LAB — PHOSPHORUS: Phosphorus: 3.3 mg/dL (ref 2.5–4.6)

## 2020-10-05 MED ORDER — METOPROLOL TARTRATE 5 MG/5ML IV SOLN
5.0000 mg | INTRAVENOUS | Status: AC | PRN
  Administered 2020-10-05 (×3): 5 mg via INTRAVENOUS
  Filled 2020-10-05 (×3): qty 5

## 2020-10-05 MED ORDER — ALPRAZOLAM 0.5 MG PO TABS
0.5000 mg | ORAL_TABLET | Freq: Two times a day (BID) | ORAL | Status: DC | PRN
  Administered 2020-10-05 – 2020-10-11 (×10): 0.5 mg via ORAL
  Filled 2020-10-05 (×10): qty 1

## 2020-10-05 MED ORDER — AMIODARONE HCL IN DEXTROSE 360-4.14 MG/200ML-% IV SOLN
30.0000 mg/h | INTRAVENOUS | Status: DC
  Administered 2020-10-05 – 2020-10-06 (×3): 30 mg/h via INTRAVENOUS
  Filled 2020-10-05 (×3): qty 200

## 2020-10-05 MED ORDER — ORAL CARE MOUTH RINSE
15.0000 mL | Freq: Two times a day (BID) | OROMUCOSAL | Status: DC
  Administered 2020-10-05 – 2020-10-12 (×13): 15 mL via OROMUCOSAL

## 2020-10-05 MED ORDER — AMIODARONE IV BOLUS ONLY 150 MG/100ML
150.0000 mg | Freq: Once | INTRAVENOUS | Status: AC
  Administered 2020-10-05: 150 mg via INTRAVENOUS

## 2020-10-05 MED ORDER — METOPROLOL TARTRATE 25 MG PO TABS
12.5000 mg | ORAL_TABLET | Freq: Four times a day (QID) | ORAL | Status: DC
  Administered 2020-10-05 – 2020-10-06 (×6): 12.5 mg via ORAL
  Filled 2020-10-05 (×7): qty 1

## 2020-10-05 MED ORDER — AMIODARONE HCL IN DEXTROSE 360-4.14 MG/200ML-% IV SOLN
60.0000 mg/h | INTRAVENOUS | Status: AC
  Administered 2020-10-05 (×2): 60 mg/h via INTRAVENOUS
  Filled 2020-10-05 (×3): qty 200

## 2020-10-05 MED ORDER — AMIODARONE LOAD VIA INFUSION
150.0000 mg | Freq: Once | INTRAVENOUS | Status: AC
  Administered 2020-10-05: 150 mg via INTRAVENOUS
  Filled 2020-10-05: qty 83.34

## 2020-10-05 MED ORDER — PANTOPRAZOLE SODIUM 40 MG PO TBEC
40.0000 mg | DELAYED_RELEASE_TABLET | Freq: Every day | ORAL | Status: DC
  Administered 2020-10-05 – 2020-10-12 (×8): 40 mg via ORAL
  Filled 2020-10-05 (×7): qty 1

## 2020-10-05 NOTE — Consult Note (Addendum)
Cardiology Consultation:   Patient ID: Toniqua Melamed MRN: [id number]; DOB: 02-14-1946  Admit date: 10/02/2020 Date of Consult: 10/05/2020  Primary Care Provider: Adin Hector, MD [facility] Cardiologist: Virl Axe, MD  [facility] Electrophysiologist:  Virl Axe, MD    Patient Profile:   Masiel Gentzler is a 74 y.o. 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 Erlinda Hong.  History of Present Illness:   Ms. Winiarski is followed by Dr. [name] for the above cardiac issues. She had about 7 strokes and lost vision with one of them earlier this year. Patient underwent PFO closure in 2010 at [facility]. She underwent IRL implant 03/2016. Prior TEE in 02/2016 showed well-positioned PFO closure and was started on Eliquis. Patient self discontinued it later because of cost. She was taking aspirin. She was seen in follow-up and it was recommended multiple times she start Eliquis however patient refused. She is asymptomatic in afib. More recently she had bradycardia and BB was discontinued. Recent heart monitor showed NSR with 1% afib with elevated rates and SVT. Dr. [name] was considering pacing vs ablation.   The patient presented to the ER 10/02/20 with left sided weakness, fall, and confusion. CTA head and neck showed acute stroke. She did not receive IV tPA due to Eliquis. She underwent mechanical thrombectomy with IR with small volume SAH. Eliquis was held. CT head 11/29 showed evolving posterior MCA territory. This morning patient went into Afib RVR and was given IV metoprolol and subsequently started on amio bolus. Cardiology was consulted.   She is on Xanax and Haldol. She is oriented x 2 however sleepy and not very responsive. Denies chest pain or sob.     Past Medical History:  Diagnosis Date  . Anemia    past  hx  . Anxiety   . Depression   . Diabetes mellitus without complication ([place])   . Dysrhythmia    prior to PFO correction  . GERD (gastroesophageal reflux disease)   . Headache    before PFO closure  . Hypercholesteremia   . Hypertension   . Hypothyroidism   . 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 PFO (patent foramen ovale)    correction device placed 2010  . Right leg numbness    lower leg- residual from CVA  . Seasonal allergies   . Stroke [facility])    prior to PFO correction    Past Surgical History:  Procedure Laterality Date  . ABDOMINAL HYSTERECTOMY    . BUBBLE STUDY  02/06/2020   Procedure: BUBBLE STUDY;  Surgeon: Sanda Klein, MD;  Location: [facility];  Service: Cardiovascular;;  . [place]  . CATARACT EXTRACTION W/PHACO Right 09/01/2015   Procedure: CATARACT EXTRACTION PHACO AND INTRAOCULAR LENS PLACEMENT (IOC);  Surgeon: Leandrew Koyanagi, MD;  Location: [place];  Service: Ophthalmology;  Laterality: Right;  DIABETIC - oral meds  . CATARACT EXTRACTION W/PHACO Left 10/20/2015   Procedure: CATARACT EXTRACTION PHACO AND INTRAOCULAR LENS PLACEMENT (IOC);  Surgeon: Leandrew Koyanagi, MD;  Location: [place];  Service: Ophthalmology;  Laterality: Left;  DIABETIC - oral meds  . EP IMPLANTABLE DEVICE N/A 04/04/2016   Procedure: Loop Recorder Insertion;  Surgeon: Deboraha Sprang, MD;  Location: [place] CV LAB;  Service: Cardiovascular;  Laterality: N/A;  . FOOT NEUROMA SURGERY Left   . IR CT HEAD LTD  10/02/2020  . IR PERCUTANEOUS ART THROMBECTOMY/INFUSION INTRACRANIAL INC DIAG ANGIO  10/02/2020  . IR US GUIDE VASC ACCESS RIGHT  10/02/2020  . NASAL SINUS SURGERY    . PATENT FORAMEN OVALE CLOSURE    . PATENT FORAMEN OVALE CLOSURE    . RADIOLOGY WITH ANESTHESIA N/A 10/02/2020   Procedure: IR WITH ANESTHESIA;  Surgeon: Luanne Bras, MD;   Location: [place];  Service: Radiology;  Laterality: N/A;  . TEE WITHOUT CARDIOVERSION N/A 02/09/2016   Procedure: TRANSESOPHAGEAL ECHOCARDIOGRAM (TEE);  Surgeon: Adrian Prows, MD;  Location: [place];  Service: Cardiovascular;  Laterality: N/A;  . TEE WITHOUT CARDIOVERSION N/A 11/12/2019   Procedure: TRANSESOPHAGEAL ECHOCARDIOGRAM (TEE);  Surgeon: Minna Merritts, MD;  Location: [facility] ORS;  Service: Cardiovascular;  Laterality: N/A;  . TEE WITHOUT CARDIOVERSION N/A 02/06/2020   Procedure: TRANSESOPHAGEAL ECHOCARDIOGRAM (TEE);  Surgeon: Sanda Klein, MD;  Location: [facility] ENDOSCOPY;  Service: Cardiovascular;  Laterality: N/A;     Home Medications:  Prior to Admission medications   Medication Sig Start Date End Date Taking? Authorizing Provider  ALPRAZolam (XANAX) 0.25 MG tablet Take 0.25 mg by mouth 2 (two) times daily as needed for anxiety.   Yes [provider]  amLODipine (NORVASC) 10 MG tablet Take 10 mg by mouth every evening.    Yes [provider]  atorvastatin (LIPITOR) 80 MG tablet Take 80 mg by mouth daily.   Yes [provider]  buPROPion (WELLBUTRIN SR) 150 MG 12 hr tablet Take 1 tablet (150 mg total) by mouth 2 (two) times daily. 03/27/16  Yes Garvin Fila, MD  cholecalciferol (VITAMIN D3) 25 MCG (1000 UT) tablet Take 1,000 Units by mouth daily.   Yes [provider]  desvenlafaxine (PRISTIQ) 100 MG 24 hr tablet Take 100 mg by mouth daily. 12/05/19  Yes [provider]  ELIQUIS 5 MG TABS tablet TAKE 1 TABLET (5 MG TOTAL) BY MOUTH 2 (TWO) TIMES DAILY. Patient taking differently: Take 5 mg by mouth 2 (two) times daily.  02/23/20  Yes Deboraha Sprang, MD  ferrous sulfate 325 (65 FE) MG tablet Take 325 mg by mouth every evening.   Yes [provider]  fluticasone (FLONASE) 50 MCG/ACT nasal spray Place into both nostrils daily as needed for allergies or rhinitis.   Yes [provider]  levothyroxine (SYNTHROID, LEVOTHROID) 50 MCG  tablet Take 50 mcg by mouth daily before breakfast.   Yes [provider]  losartan (COZAAR) 100 MG tablet Take 100 mg by mouth daily.    Yes [provider]  metFORMIN (GLUCOPHAGE) 1000 MG tablet Take 1,000 mg by mouth daily with breakfast.    Yes [provider]  pantoprazole (PROTONIX) 40 MG tablet Take 40 mg by mouth 2 (two) times daily. Reported on 04/17/2016   Yes [provider]  traZODone (DESYREL) 50 MG tablet Take 50 mg by mouth at bedtime as needed for sleep.    Yes [provider]  vitamin B-12 (CYANOCOBALAMIN) 500 MCG tablet Take 500 mcg by mouth every evening.    Yes [provider]    Inpatient Medications: Scheduled Meds: .  stroke: mapping our early stages of recovery book   Does not apply Once  . amLODipine  10 mg Oral QPM  . atorvastatin  80 mg Oral Daily  . buPROPion  150 mg Oral BID  .  Chlorhexidine Gluconate Cloth  6 each Topical Q0600  . insulin aspart  0-6 Units Subcutaneous Q4H  . levothyroxine  50 mcg Oral QAC breakfast  . mouth rinse  15 mL Mouth Rinse BID  . metoprolol tartrate  12.5 mg Oral Q6H  . mupirocin ointment  1 application Nasal BID  . nicotine  14 mg Transdermal Daily  . pantoprazole  40 mg Oral Daily  . QUEtiapine  25 mg Oral BID  . venlafaxine XR  150 mg Oral Q breakfast  . vitamin B-12  500 mcg Oral QPM   Continuous Infusions: . sodium chloride 40 mL/hr at 10/05/20 1200  . amiodarone 60 mg/hr (10/05/20 1200)   Followed by  . amiodarone    . clevidipine Stopped (10/04/20 0723)   PRN Meds: acetaminophen **OR** acetaminophen (TYLENOL) oral liquid 160 mg/5 mL **OR** acetaminophen, ALPRAZolam, haloperidol lactate  Allergies:    Allergies  Allergen Reactions  . Alendronate Other (See Comments)    Rash   . Penicillins Rash    Also swelling and itching  Did it involve swelling of the face/tongue/throat, SOB, or low BP? Yes Did it involve sudden or severe rash/hives, skin peeling, or any  reaction on the inside of your mouth or nose? Yes Did you need to seek medical attention at a hospital or doctor's office? Yes When did it last happen?More than 15 years ago If all above answers are "NO", may proceed with cephalosporin use.     Social History:   Social History   Socioeconomic History  . Marital status: Married    Spouse name: Not on file  . Number of children: Not on file  . Years of education: Not on file  . Highest education level: Not on file  Occupational History  . Not on file  Tobacco Use  . Smoking status: Current Every Day Smoker    Packs/day: 1.00    Years: 55.00    Pack years: 55.00  . Smokeless tobacco: Never Used  Vaping Use  . Vaping Use: Never used  Substance and Sexual Activity  . Alcohol use: No  . Drug use: No  . Sexual activity: Not on file  Other Topics Concern  . Not on file  Social History Narrative  . Not on file   Social Determinants of Health   Financial Resource Strain:   . Difficulty of Paying Living Expenses: Not on file  Food Insecurity:   . Worried About Charity fundraiser in the Last Year: Not on file  . Ran Out of Food in the Last Year: Not on file  Transportation Needs:   . Lack of Transportation (Medical): Not on file  . Lack of Transportation (Non-Medical): Not on file  Physical Activity:   . Days of Exercise per Week: Not on file  . Minutes of Exercise per Session: Not on file  Stress:   . Feeling of Stress : Not on file  Social Connections:   . Frequency of Communication with Friends and Family: Not on file  . Frequency of Social Gatherings with Friends and Family: Not on file  . Attends Religious Services: Not on file  . Active Member of Clubs or Organizations: Not on file  . Attends Archivist Meetings: Not on file  . Marital Status: Not on file  Intimate Partner Violence:   . Fear of Current or Ex-Partner: Not on file  . Emotionally Abused: Not on file  . Physically Abused: Not on file  .  Sexually  Abused: Not on file    Family History:   Family History  Problem Relation Age of Onset  . Dementia Father   . Breast cancer Sister 40     ROS:  Please see the history of present illness.  All other ROS reviewed and negative.     Physical Exam/Data:   Vitals:   10/05/20 1100 10/05/20 1155 10/05/20 1200 10/05/20 1210  BP: (!) 128/92  130/78   Pulse: 88  88   Resp: 20  (!) 23   Temp:  99.4 F (37.4 C)    TempSrc:      SpO2: 94%  92% 94%  Weight:      Height:        Intake/Output Summary (Last 24 hours) at 10/05/2020 1308 Last data filed at 10/05/2020 1200 Gross per 24 hour  Intake 1363.45 ml  Output 600 ml  Net 763.45 ml   Last 3 Weights 10/02/2020 09/07/2020 03/16/2020  Weight (lbs) 134 lb 11.2 oz 131 lb 135 lb 4 oz  Weight (kg) 61.1 kg 59.421 kg 61.349 kg     Body mass index is 22.42 kg/m.  General:  Well nourished, well developed, in no acute distress HEENT: normal Lymph: no adenopathy Neck: no JVD Endocrine:  No thryomegaly Vascular: No carotid bruits; FA pulses 2+ bilaterally without bruits  Cardiac:  normal S1, S2; Irreg Irreg; no murmur  Lungs:  clear to auscultation bilaterally, no wheezing, rhonchi or rales  Abd: soft, nontender, no hepatomegaly  Ext: no edema Musculoskeletal:  No deformities, BUE and BLE strength normal and equal Skin: warm and dry  Neuro:  CNs 2-12 intact, no focal abnormalities noted Psych:  Normal affect   EKG:  The EKG was personally reviewed and demonstrates:  penidng Telemetry:  Telemetry was personally reviewed and demonstrates:  Afib RVR, rates up to 160s, now in the 80s  Relevant CV Studies:  Echo 10/04/20  1. Left ventricular ejection fraction, by estimation, is 60 to 65%. The  left ventricle has normal function. The left ventricle has no regional  wall motion abnormalities. There is moderate asymmetric left ventricular  hypertrophy of the basal-septal  segment. Left ventricular diastolic parameters are  indeterminate.  2. Right ventricular systolic function is normal. The right ventricular  size is normal. Tricuspid regurgitation signal is inadequate for assessing  PA pressure.  3. The mitral valve is degenerative. Mild mitral valve regurgitation.  Mild mitral stenosis. 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  4. The aortic valve is tricuspid. Aortic valve regurgitation is not  visualized. No aortic stenosis is present.  5. The inferior vena cava is normal in size with greater than 50%  respiratory variability, suggesting right atrial pressure of 3 mmHg.   Long term monitor 09/07/20 Indication: afib  Duration: 14d  Findings HR  avg 70  Min 47-Max 123  PVCs Rare, less than 1%  PACs 1.4% RECURRENT SVT --SHORT atrial tachycardia Afib >>3 hrs  -- mean HR 120's    Recommendations Continue anticoagulation May need strategy for rate control, either Afib therapy or backup brady pacing with more AV nodal blockade  Laboratory Data:  High Sensitivity Troponin:   Recent Labs  Lab 10/04/20 0204 10/04/20 1208  TROPONINIHS 569* 804*     Chemistry Recent Labs  Lab 10/03/20 0734 10/04/20 0118 10/05/20 0021  NA 140 139 140  K 4.2 3.7 3.9  CL  112* 109 108  CO2 17* 17* 17*  GLUCOSE 85 99 140*  BUN 14 11 11   CREATININE 1.46* 1.28* 1.32*  CALCIUM 8.5* 8.8* 8.9  GFRNONAA 38* 44* 42*  ANIONGAP 11 13 15     Recent Labs  Lab 10/02/20 1903  PROT 6.9  ALBUMIN 3.6  AST 18  ALT 15  ALKPHOS 70  BILITOT 0.3   Hematology Recent Labs  Lab 10/03/20 0734 10/04/20 0118 10/05/20 0021  WBC 9.7 11.5* 13.2*  RBC 2.99* 3.22* 3.47*  HGB 9.7* 10.3* 11.1*  HCT 28.6* 30.5* 32.6*  MCV 95.7 94.7 93.9  MCH 32.4 32.0 32.0  MCHC 33.9 33.8 34.0  RDW 12.8 12.9 13.0  PLT 215 232 255   BNPNo results for input(s): BNP, PROBNP in the last 168 hours.  DDimer No  results for input(s): DDIMER in the last 168 hours.   Radiology/Studies:  CT Code Stroke CTA Head W/WO contrast  Result Date: 10/02/2020 CLINICAL DATA:  Left-sided weakness. EXAM: CT ANGIOGRAPHY HEAD AND NECK TECHNIQUE: Multidetector CT imaging of the head and neck was performed using the standard protocol during bolus administration of intravenous contrast. Multiplanar CT image reconstructions and MIPs were obtained to evaluate the vascular anatomy. Carotid stenosis measurements (when applicable) are obtained utilizing NASCET criteria, using the distal internal carotid diameter as the denominator. CONTRAST:  170mL OMNIPAQUE IOHEXOL 350 MG/ML SOLN COMPARISON:  Head and neck MRA 11/11/2019 FINDINGS: CTA NECK FINDINGS Aortic arch: Normal variant aortic arch branching pattern with common origin of the brachiocephalic and left common carotid arteries. Mild arch atherosclerosis without arch vessel origin stenosis. Right carotid system: Patent with minimal calcified plaque in the proximal ICA. No evidence of stenosis or dissection. Tortuous proximal common carotid artery. Left carotid system: Patent with minimal soft and calcified plaque in the proximal ICA. No evidence of stenosis or dissection. Tortuous proximal common carotid artery. Vertebral arteries: Patent with mild scattered plaque bilaterally. No evidence of stenosis or dissection. Slightly dominant left vertebral artery. Skeleton: Focally advanced disc degeneration at C4-5. Other neck: No evidence of cervical lymphadenopathy or mass. Upper chest: Mild centrilobular emphysema. Review of the MIP images confirms the above findings CTA HEAD FINDINGS Anterior circulation: The internal carotid arteries are patent from skull base to carotid termini with mild calcified plaque bilaterally not resulting in a significant stenosis. The right M1 segment is widely patent, however there is occlusion of the right M2 inferior division less than 1 cm beyond the MCA  bifurcation. The left MCA and both ACAs are patent. There is an unchanged severe proximal left A1 stenosis. No aneurysm is identified. Posterior circulation: The intracranial vertebral arteries are widely patent to the basilar. Patent right PICA, left AICA, and bilateral SCA origins are identified. The basilar artery is widely patent. Posterior communicating arteries are diminutive or absent. The PCAs are patent without evidence of a significant proximal stenosis on the left. There is an unchanged moderate proximal right P2 stenosis. No aneurysm is identified. Venous sinuses: Not well evaluated due to arterial contrast timing. Anatomic variants: None. Review of the MIP images confirms the above findings IMPRESSION: 1. Emergent proximal right M2 occlusion. 2. Mild carotid and vertebral atherosclerosis without significant stenosis. 3.  Aortic Atherosclerosis (ICD10-I70.0). These results were communicated to Dr. Leonel Ramsay at 7:23 pm on 10/02/2020 by text page via the [facility] messaging system. Electronically Signed   By: Logan Bores M.D.   On: 10/02/2020 19:44   CT HEAD WO CONTRAST  Addendum Date: 10/04/2020   ADDENDUM REPORT:  10/04/2020 14:56 ADDENDUM: Findings discussed Dr. Erlinda Hong at 2:56 PM via telephone. Electronically Signed   By: Margaretha Sheffield MD   On: 10/04/2020 14:56   Result Date: 10/04/2020 CLINICAL DATA:  Stroke follow-up. EXAM: CT HEAD WITHOUT CONTRAST TECHNIQUE: Contiguous axial images were obtained from the base of the skull through the vertex without intravenous contrast. COMPARISON:  October 03, 2020. FINDINGS: Severely limited study given patient motion. Findings could easily be obscured. There is evidence of a right MCA territory infarct with progressive edema involving the right insula, posterior right frontal lobe, parietal lobe, and superior temporal lobe. There is suggestion of an area of hyperdensity in the region of the right sylvian fissure, concerning for acute hemorrhage (possibly  subarachnoid). No evidence of substantial midline shift. No evidence of hydrocephalus. IMPRESSION: Severely limited study with findings consistent with an evolving posterior right MCA territory infarct with progressive edema. There is hyperdensity in the region of the right sylvian fissure, concerning for acute subarachnoid hemorrhage but suboptimally evaluated. There is sulcal effacement without substantial midline shift. Electronically Signed: By: Margaretha Sheffield MD On: 10/04/2020 14:50   CT HEAD WO CONTRAST  Result Date: 10/03/2020 CLINICAL DATA:  Altered mental status. EXAM: CT HEAD WITHOUT CONTRAST TECHNIQUE: Contiguous axial images were obtained from the base of the skull through the vertex without intravenous contrast. COMPARISON:  CT head October 02, 2020. IMPRESSION: Nondiagnostic evaluation due to patient intolerance and motion. The technologist was unable to perform a repeat exam due to safety concern. Electronically Signed   By: Margaretha Sheffield MD   On: 10/03/2020 13:24   CT Code Stroke CTA Neck W/WO contrast  Result Date: 10/02/2020 CLINICAL DATA:  Left-sided weakness. EXAM: CT ANGIOGRAPHY HEAD AND NECK TECHNIQUE: Multidetector CT imaging of the head and neck was performed using the standard protocol during bolus administration of intravenous contrast. Multiplanar CT image reconstructions and MIPs were obtained to evaluate the vascular anatomy. Carotid stenosis measurements (when applicable) are obtained utilizing NASCET criteria, using the distal internal carotid diameter as the denominator. CONTRAST:  138mL OMNIPAQUE IOHEXOL 350 MG/ML SOLN COMPARISON:  Head and neck MRA 11/11/2019 FINDINGS: CTA NECK FINDINGS Aortic arch: Normal variant aortic arch branching pattern with common origin of the brachiocephalic and left common carotid arteries. Mild arch atherosclerosis without arch vessel origin stenosis. Right carotid system: Patent with minimal calcified plaque in the proximal ICA. No  evidence of stenosis or dissection. Tortuous proximal common carotid artery. Left carotid system: Patent with minimal soft and calcified plaque in the proximal ICA. No evidence of stenosis or dissection. Tortuous proximal common carotid artery. Vertebral arteries: Patent with mild scattered plaque bilaterally. No evidence of stenosis or dissection. Slightly dominant left vertebral artery. Skeleton: Focally advanced disc degeneration at C4-5. Other neck: No evidence of cervical lymphadenopathy or mass. Upper chest: Mild centrilobular emphysema. Review of the MIP images confirms the above findings CTA HEAD FINDINGS Anterior circulation: The internal carotid arteries are patent from skull base to carotid termini with mild calcified plaque bilaterally not resulting in a significant stenosis. The right M1 segment is widely patent, however there is occlusion of the right M2 inferior division less than 1 cm beyond the MCA bifurcation. The left MCA and both ACAs are patent. There is an unchanged severe proximal left A1 stenosis. No aneurysm is identified. Posterior circulation: The intracranial vertebral arteries are widely patent to the basilar. Patent right PICA, left AICA, and bilateral SCA origins are identified. The basilar artery is widely patent. Posterior communicating arteries are  diminutive or absent. The PCAs are patent without evidence of a significant proximal stenosis on the left. There is an unchanged moderate proximal right P2 stenosis. No aneurysm is identified. Venous sinuses: Not well evaluated due to arterial contrast timing. Anatomic variants: None. Review of the MIP images confirms the above findings IMPRESSION: 1. Emergent proximal right M2 occlusion. 2. Mild carotid and vertebral atherosclerosis without significant stenosis. 3.  Aortic Atherosclerosis (ICD10-I70.0). These results were communicated to Dr. Leonel Ramsay at 7:23 pm on 10/02/2020 by text page via the [facility] messaging system. Electronically  Signed   By: Logan Bores M.D.   On: 10/02/2020 19:44   IR CT Head Ltd  Result Date: 10/04/2020 INDICATION: 74 year old female with a history of acute stroke with left-sided symptoms, presents for attempt at thrombectomy EXAM: ULTRASOUND-GUIDED ACCESS RIGHT COMMON FEMORAL ARTERY CERVICAL AND CEREBRAL ANGIOGRAM MECHANICAL THROMBECTOMY RIGHT MCA TERRITORY ANGIO-SEAL FOR HEMOSTASIS COMPARISON:  CT imaging of the same day MEDICATIONS: None ANESTHESIA/SEDATION: The anesthesia team was present to provide general endotracheal tube anesthesia and for patient monitoring during the procedure. Intubation was performed in negative pressure [place] in neuro IR holding. Left radial arterial line was performed by the anesthesia team. Interventional neuro radiology nursing staff was also present. CONTRAST:  110 cc FLUOROSCOPY TIME:  Fluoroscopy Time: 22 minutes 36 seconds (841 mGy). COMPLICATIONS: SIR LEVEL B - Normal therapy, includes overnight admission for observation., Subarachnoid hemorrhage TECHNIQUE: Informed written consent was obtained from the patient's family after a thorough discussion of the procedural risks, benefits and alternatives. Specific risks discussed include: Bleeding, infection, contrast reaction, kidney injury/failure, need for further procedure/surgery, arterial injury or dissection, embolization to new territory, intracranial hemorrhage (10-15% risk), neurologic deterioration, cardiopulmonary collapse, death. All questions were addressed. Maximal Sterile Barrier Technique was utilized including during the procedure including caps, mask, sterile gowns, sterile gloves, sterile drape, hand hygiene and skin antiseptic. A timeout was performed prior to the initiation of the procedure. The anesthesia team was present to provide general endotracheal tube anesthesia and for patient monitoring during the procedure. Interventional neuro radiology nursing staff was also present. FINDINGS: Initial Findings: Right  common carotid artery:  Normal course caliber and contour. Right external carotid artery: Patent with antegrade flow. Right internal carotid artery: Normal course caliber and contour of the cervical portion. Vertical and petrous segment patent with normal course caliber contour. Cavernous segment patent. Clinoid segment patent. Antegrade flow of the ophthalmic artery. Ophthalmic segment patent. Terminus patent. Right MCA: M1 segment patent. The M1 bifurcates to superior and inferior divisions at the insula, occlusion of the inferior division just after the branch point. Inferior division is the target: TICI 0: No perfusion or antegrade flow beyond site of occlusion Right ACA: A 1 segment patent. The A2 segment perfuses both the right and left ACA territory, with apparent azygos configuration. Completion Findings: Right MCA: M1 patent. No embolism new territory. Restoration of flow through the artery of interest, inferior division TICI 3: Complete perfusion of the territory PROCEDURE: The anesthesia team was present to provide general endotracheal tube anesthesia and for patient monitoring during the procedure. Intubation was performed in negative pressure [place] in neuro IR holding. Interventional neuro radiology nursing staff was also present. Ultrasound survey of the right inguinal region was performed with images stored and sent to PACs. 11 blade scalpel was used to make a small incision. Blunt dissection was performed with US guidance. A micropuncture needle was used access the right common femoral artery under ultrasound. With excellent arterial blood flow returned,  an .018 micro wire was passed through the needle, observed to enter the abdominal aorta under fluoroscopy. The needle was removed, and a micropuncture sheath was placed over the wire. The inner dilator and wire were removed, and an 035 wire was advanced under fluoroscopy into the abdominal aorta. The sheath was removed and a 25cm 59F straight vascular  sheath was placed. The dilator was removed and the sheath was flushed. Sheath was attached to pressurized and heparinized saline bag for constant forward flow. A coaxial system was then advanced over the 035 wire. This included a 95cm 087 "Walrus" balloon guide with coaxial 125cm Berenstein diagnostic catheter. This was advanced to the proximal descending thoracic aorta. Wire was then removed. Double flush of the catheter was performed. Catheter was then used to select the innominate artery. Angiogram was performed. Using roadmap technique, the catheter was advanced over a standard glide wire into right cervical ICA, with distal position achieved of the balloon guide. The diagnostic catheter and the wire were removed. Formal angiogram was performed. Road map function was used once the occluded vessel was identified. Copious back flush was performed and the balloon catheter was attached to heparinized and pressurized saline bag for forward flow. A second coaxial system was then advanced through the balloon catheter, which included the selected intermediate catheter, microcatheter, and microwire. In this scenario, the set up included a 132cm CAT-6 intermediate catheter, a Trevo Provue18 microcatheter, and 014 synchro standard wire. This system was advanced through the balloon guide catheter under the road-map function, with adequate back-flush at the rotating hemostatic valve at that back end of the balloon guide. . Microcatheter and the intermediate catheter system were advanced through the terminal ICA and MCA to the level of the occlusion. The micro wire was then carefully advanced through the occluded segment. Microcatheter was then manipulated through the occluded segment and the wire was removed with saline drip at the hub. Blood was then aspirated through the hub of the microcatheter, and a gentle contrast injection was performed confirming intraluminal position. A rotating hemostatic valve was then attached to  the back end of the microcatheter, and a pressurized and heparinized saline bag was attached to the catheter. 26mm Trevo device was then selected. Back flush was achieved at the rotating hemostatic valve, and then the device was gently advanced through the microcatheter to the distal end. The retriever was then unsheathed by withdrawing the microcatheter under fluoroscopy. Once the retriever was completely unsheathed, the microcatheter was carefully stripped from the delivery device. Control angiogram was performed from the intermediate catheter. Occlusion persisted at the inferior division. We elected to attempt a second pass. On the second pass, slight alteration was performed with the second coaxial system. In this next scenario, the coaxial set up included a 132cm CAT-5 intermediate catheter, a Trevo Provue18 microcatheter, and 014 synchro standard wire. This system was advanced through the balloon guide catheter under the road-map function, with adequate back-flush at the rotating hemostatic valve at that back end of the balloon guide. Microcatheter and the intermediate catheter system were advanced through the terminal ICA and MCA to the level of the occlusion. The micro wire was then carefully advanced through the occluded segment. Microcatheter was then manipulated through the occluded segment and the wire was removed with saline drip at the hub. On this attempt, the microcatheter and microwire were recognized to enter a more cephalad branch, the angular artery rather than the position on the first attempt, which was a more caudal branch. Blood  was then aspirated through the hub of the microcatheter, and a gentle contrast injection was performed confirming intraluminal position. A rotating hemostatic valve was then attached to the back end of the microcatheter, and a pressurized and heparinized saline bag was attached to the catheter. 83mm Trevo device was then again used. Back flush was achieved at the  rotating hemostatic valve, and then the device was gently advanced through the microcatheter to the distal end. The retriever was then unsheathed by withdrawing the microcatheter under fluoroscopy. Once the retriever was completely unsheathed, the microcatheter was carefully stripped from the delivery device. Control angiogram was performed from the intermediate catheter. A 3 minute time interval was observed. The balloon at the balloon guide catheter was then inflated under fluoroscopy for proximal flow arrest. Constant aspiration using the proprietary engine was then performed at the intermediate catheter, as the retriever was gently and slowly withdrawn with fluoroscopic observation. Once the retriever was "corked" within the tip of the intermediate catheter, both were removed from the system. Free aspiration was confirmed at the hub of the balloon guide catheter, with free blood return confirmed. The balloon was then deflated, and a control angiogram was performed. Occlusion persisted within the inferior division, with TICI 0 flow. We elected to attempt a third pass. In this third pass scenario we again altered the coaxial system. The set up included a 137cm zoom 71 intermediate catheter, a Trevo Provue18 microcatheter, and 014 synchro standard wire. This system was advanced through the balloon guide catheter under the road-map function, with adequate back-flush at the rotating hemostatic valve at that back end of the balloon guide. Microcatheter and the intermediate catheter system were advanced through the terminal ICA and MCA to the level of the occlusion. The micro wire was then carefully advanced through the occluded segment. Microcatheter was then manipulated through the occluded segment and the wire was removed with saline drip at the hub. The wire again entered the angular branch on this occasion. Blood was then aspirated through the hub of the microcatheter, and a gentle contrast injection was performed  confirming intraluminal position. A rotating hemostatic valve was then attached to the back end of the microcatheter, and a pressurized and heparinized saline bag was attached to the catheter. On this attempt, a 4 mm solitaire device was selected. Back flush was achieved at the rotating hemostatic valve, and then the device was gently advanced through the microcatheter to the distal end. The retriever was then unsheathed by withdrawing the microcatheter under fluoroscopy. Once the retriever was completely unsheathed, the microcatheter was carefully stripped from the delivery device. Control angiogram was performed from the intermediate catheter. A 3 minute time interval was observed. The balloon at the balloon guide catheter was then inflated under fluoroscopy for proximal flow arrest. Constant aspiration using the proprietary engine was then performed at the intermediate catheter, as the retriever was gently and slowly withdrawn with fluoroscopic observation. Once the retriever was "corked" within the tip of the intermediate catheter, both were removed from the system. Free aspiration was confirmed at the hub of the balloon guide catheter, with free blood return confirmed. The balloon was then deflated, and a control angiogram was performed. Restoration of flow was confirmed. Angiogram of the cervical ICA was performed. Balloon guide was then removed. The skin at the puncture site was then cleaned with Chlorhexidine. The 8 French sheath was removed and an 34F angioseal was deployed. Flat panel CT was performed. Patient was extubated once the CT was reviewed with  neurology team. Patient tolerated the procedure well and remained hemodynamically stable throughout. Blood loss estimated 100 cc IMPRESSION: Status post ultrasound guided access right common femoral artery for cervical/cerebral angiogram and mechanical thrombectomy of right MCA M2 inferior branch division, achieving TICI 3 flow after 3 passes of combination  therapy using stent retriever and local aspiration on each attempt. Small volume subarachnoid hemorrhage in the sylvian fissure at the completion of the exam. Angio-Seal for hemostasis. Signed, Dulcy Fanny. Dellia Nims, [place] [facility] Radiology PLAN: Patient was extubated. ICU status Target systolic blood pressure of 120-140 Right hip straight time 6 hours Frequent neurovascular checks Repeat neurologic imaging with CT and/MRI at the discretion of neurology team Electronically Signed   By: Corrie Mckusick D.O.   On: 10/04/2020 08:18   IR US Guide Vasc Access Right  Result Date: 10/04/2020 INDICATION: 74 year old female with a history of acute stroke with left-sided symptoms, presents for attempt at thrombectomy EXAM: ULTRASOUND-GUIDED ACCESS RIGHT COMMON FEMORAL ARTERY CERVICAL AND CEREBRAL ANGIOGRAM MECHANICAL THROMBECTOMY RIGHT MCA TERRITORY ANGIO-SEAL FOR HEMOSTASIS COMPARISON:  CT imaging of the same day MEDICATIONS: None ANESTHESIA/SEDATION: The anesthesia team was present to provide general endotracheal tube anesthesia and for patient monitoring during the procedure. Intubation was performed in negative pressure [place] in neuro IR holding. Left radial arterial line was performed by the anesthesia team. Interventional neuro radiology nursing staff was also present. CONTRAST:  110 cc FLUOROSCOPY TIME:  Fluoroscopy Time: 22 minutes 36 seconds (841 mGy). COMPLICATIONS: SIR LEVEL B - Normal therapy, includes overnight admission for observation., Subarachnoid hemorrhage TECHNIQUE: Informed written consent was obtained from the patient's family after a thorough discussion of the procedural risks, benefits and alternatives. Specific risks discussed include: Bleeding, infection, contrast reaction, kidney injury/failure, need for further procedure/surgery, arterial injury or dissection, embolization to new territory, intracranial hemorrhage (10-15% risk), neurologic  deterioration, cardiopulmonary collapse, death. All questions were addressed. Maximal Sterile Barrier Technique was utilized including during the procedure including caps, mask, sterile gowns, sterile gloves, sterile drape, hand hygiene and skin antiseptic. A timeout was performed prior to the initiation of the procedure. The anesthesia team was present to provide general endotracheal tube anesthesia and for patient monitoring during the procedure. Interventional neuro radiology nursing staff was also present. FINDINGS: Initial Findings: Right common carotid artery:  Normal course caliber and contour. Right external carotid artery: Patent with antegrade flow. Right internal carotid artery: Normal course caliber and contour of the cervical portion. Vertical and petrous segment patent with normal course caliber contour. Cavernous segment patent. Clinoid segment patent. Antegrade flow of the ophthalmic artery. Ophthalmic segment patent. Terminus patent. Right MCA: M1 segment patent. The M1 bifurcates to superior and inferior divisions at the insula, occlusion of the inferior division just after the branch point. Inferior division is the target: TICI 0: No perfusion or antegrade flow beyond site of occlusion Right ACA: A 1 segment patent. The A2 segment perfuses both the right and left ACA territory, with apparent azygos configuration. Completion Findings: Right MCA: M1 patent. No embolism new territory. Restoration of flow through the artery of interest, inferior division TICI 3: Complete perfusion of the territory PROCEDURE: The anesthesia team was present to provide general endotracheal tube anesthesia and for patient monitoring during the procedure. Intubation was performed in negative pressure [place] in neuro IR holding. Interventional neuro radiology nursing staff was also present. Ultrasound survey of the right inguinal region was performed with images stored and sent to PACs. 11 blade scalpel was used  to make a  small incision. Blunt dissection was performed with US guidance. A micropuncture needle was used access the right common femoral artery under ultrasound. With excellent arterial blood flow returned, an .018 micro wire was passed through the needle, observed to enter the abdominal aorta under fluoroscopy. The needle was removed, and a micropuncture sheath was placed over the wire. The inner dilator and wire were removed, and an 035 wire was advanced under fluoroscopy into the abdominal aorta. The sheath was removed and a 25cm 34F straight vascular sheath was placed. The dilator was removed and the sheath was flushed. Sheath was attached to pressurized and heparinized saline bag for constant forward flow. A coaxial system was then advanced over the 035 wire. This included a 95cm 087 "Walrus" balloon guide with coaxial 125cm Berenstein diagnostic catheter. This was advanced to the proximal descending thoracic aorta. Wire was then removed. Double flush of the catheter was performed. Catheter was then used to select the innominate artery. Angiogram was performed. Using roadmap technique, the catheter was advanced over a standard glide wire into right cervical ICA, with distal position achieved of the balloon guide. The diagnostic catheter and the wire were removed. Formal angiogram was performed. Road map function was used once the occluded vessel was identified. Copious back flush was performed and the balloon catheter was attached to heparinized and pressurized saline bag for forward flow. A second coaxial system was then advanced through the balloon catheter, which included the selected intermediate catheter, microcatheter, and microwire. In this scenario, the set up included a 132cm CAT-6 intermediate catheter, a Trevo Provue18 microcatheter, and 014 synchro standard wire. This system was advanced through the balloon guide catheter under the road-map function, with adequate back-flush at the rotating hemostatic valve  at that back end of the balloon guide. . Microcatheter and the intermediate catheter system were advanced through the terminal ICA and MCA to the level of the occlusion. The micro wire was then carefully advanced through the occluded segment. Microcatheter was then manipulated through the occluded segment and the wire was removed with saline drip at the hub. Blood was then aspirated through the hub of the microcatheter, and a gentle contrast injection was performed confirming intraluminal position. A rotating hemostatic valve was then attached to the back end of the microcatheter, and a pressurized and heparinized saline bag was attached to the catheter. 30mm Trevo device was then selected. Back flush was achieved at the rotating hemostatic valve, and then the device was gently advanced through the microcatheter to the distal end. The retriever was then unsheathed by withdrawing the microcatheter under fluoroscopy. Once the retriever was completely unsheathed, the microcatheter was carefully stripped from the delivery device. Control angiogram was performed from the intermediate catheter. Occlusion persisted at the inferior division. We elected to attempt a second pass. On the second pass, slight alteration was performed with the second coaxial system. In this next scenario, the coaxial set up included a 132cm CAT-5 intermediate catheter, a Trevo Provue18 microcatheter, and 014 synchro standard wire. This system was advanced through the balloon guide catheter under the road-map function, with adequate back-flush at the rotating hemostatic valve at that back end of the balloon guide. Microcatheter and the intermediate catheter system were advanced through the terminal ICA and MCA to the level of the occlusion. The micro wire was then carefully advanced through the occluded segment. Microcatheter was then manipulated through the occluded segment and the wire was removed with saline drip at the hub. On this  attempt, the  microcatheter and microwire were recognized to enter a more cephalad branch, the angular artery rather than the position on the first attempt, which was a more caudal branch. Blood was then aspirated through the hub of the microcatheter, and a gentle contrast injection was performed confirming intraluminal position. A rotating hemostatic valve was then attached to the back end of the microcatheter, and a pressurized and heparinized saline bag was attached to the catheter. 8mm Trevo device was then again used. Back flush was achieved at the rotating hemostatic valve, and then the device was gently advanced through the microcatheter to the distal end. The retriever was then unsheathed by withdrawing the microcatheter under fluoroscopy. Once the retriever was completely unsheathed, the microcatheter was carefully stripped from the delivery device. Control angiogram was performed from the intermediate catheter. A 3 minute time interval was observed. The balloon at the balloon guide catheter was then inflated under fluoroscopy for proximal flow arrest. Constant aspiration using the proprietary engine was then performed at the intermediate catheter, as the retriever was gently and slowly withdrawn with fluoroscopic observation. Once the retriever was "corked" within the tip of the intermediate catheter, both were removed from the system. Free aspiration was confirmed at the hub of the balloon guide catheter, with free blood return confirmed. The balloon was then deflated, and a control angiogram was performed. Occlusion persisted within the inferior division, with TICI 0 flow. We elected to attempt a third pass. In this third pass scenario we again altered the coaxial system. The set up included a 137cm zoom 71 intermediate catheter, a Trevo Provue18 microcatheter, and 014 synchro standard wire. This system was advanced through the balloon guide catheter under the road-map function, with adequate back-flush at the  rotating hemostatic valve at that back end of the balloon guide. Microcatheter and the intermediate catheter system were advanced through the terminal ICA and MCA to the level of the occlusion. The micro wire was then carefully advanced through the occluded segment. Microcatheter was then manipulated through the occluded segment and the wire was removed with saline drip at the hub. The wire again entered the angular branch on this occasion. Blood was then aspirated through the hub of the microcatheter, and a gentle contrast injection was performed confirming intraluminal position. A rotating hemostatic valve was then attached to the back end of the microcatheter, and a pressurized and heparinized saline bag was attached to the catheter. On this attempt, a 4 mm solitaire device was selected. Back flush was achieved at the rotating hemostatic valve, and then the device was gently advanced through the microcatheter to the distal end. The retriever was then unsheathed by withdrawing the microcatheter under fluoroscopy. Once the retriever was completely unsheathed, the microcatheter was carefully stripped from the delivery device. Control angiogram was performed from the intermediate catheter. A 3 minute time interval was observed. The balloon at the balloon guide catheter was then inflated under fluoroscopy for proximal flow arrest. Constant aspiration using the proprietary engine was then performed at the intermediate catheter, as the retriever was gently and slowly withdrawn with fluoroscopic observation. Once the retriever was "corked" within the tip of the intermediate catheter, both were removed from the system. Free aspiration was confirmed at the hub of the balloon guide catheter, with free blood return confirmed. The balloon was then deflated, and a control angiogram was performed. Restoration of flow was confirmed. Angiogram of the cervical ICA was performed. Balloon guide was then removed. The skin at the  puncture  site was then cleaned with Chlorhexidine. The 8 French sheath was removed and an 68F angioseal was deployed. Flat panel CT was performed. Patient was extubated once the CT was reviewed with neurology team. Patient tolerated the procedure well and remained hemodynamically stable throughout. Blood loss estimated 100 cc IMPRESSION: Status post ultrasound guided access right common femoral artery for cervical/cerebral angiogram and mechanical thrombectomy of right MCA M2 inferior branch division, achieving TICI 3 flow after 3 passes of combination therapy using stent retriever and local aspiration on each attempt. Small volume subarachnoid hemorrhage in the sylvian fissure at the completion of the exam. Angio-Seal for hemostasis. Signed, Dulcy Fanny. Dellia Nims, [place] [facility] Radiology PLAN: Patient was extubated. ICU status Target systolic blood pressure of 120-140 Right hip straight time 6 hours Frequent neurovascular checks Repeat neurologic imaging with CT and/MRI at the discretion of neurology team Electronically Signed   By: Corrie Mckusick D.O.   On: 10/04/2020 08:18   ECHOCARDIOGRAM COMPLETE  Result Date: 10/04/2020    ECHOCARDIOGRAM REPORT   Patient Name:   [facility] Date of Exam: 10/04/2020 Medical Rec #:  [id number]      Height:       65.0 in Accession #:    [id number]     Weight:       134.7 lb Date of Birth:  [birth year]      BSA:          1.672 m Patient Age:    63 years       BP:           159/67 mmHg Patient Gender: F              HR:           72 bpm. Exam Location:  Inpatient Procedure: 2D Echo, Cardiac Doppler and Color Doppler Indications:    Stroke  History:        Patient has prior history of Echocardiogram examinations, most                 recent 02/06/2020. Mitral Valve Disease, Signs/Symptoms:Altered                 Mental Status; Risk Factors:Hypertension, Diabetes and Current                 Smoker.  Sonographer:    Roseanna Rainbow RDCS  Referring Phys: [place]  Sonographer Comments: Technically difficult study due to poor echo windows, suboptimal apical window and suboptimal subcostal window. Image acquisition challenging due to uncooperative patient. Patient could not follow directions. Patient started rolling  back and forth during apicals. IMPRESSIONS  1. Left ventricular ejection fraction, by estimation, is 60 to 65%. The left ventricle has normal function. The left ventricle has no regional wall motion abnormalities. 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 Left ventricular diastolic parameters are indeterminate.  2. Right ventricular systolic function is normal. The right ventricular size is normal. Tricuspid regurgitation signal is inadequate for assessing PA pressure.  3. The mitral valve is degenerative. Mild mitral valve regurgitation. Mild mitral stenosis. 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  4. The aortic valve is tricuspid. Aortic valve regurgitation is not visualized. No aortic stenosis is present.  5. The inferior vena cava is normal in size with greater than 50% respiratory variability, suggesting  right atrial pressure of 3 mmHg. FINDINGS  Left Ventricle: Left ventricular ejection fraction, by estimation, is 60 to 65%. The left ventricle has normal function. The left ventricle has no regional wall motion abnormalities. The left ventricular internal cavity size was small. There is moderate  asymmetric left ventricular hypertrophy of the basal-septal segment. Left ventricular diastolic parameters are indeterminate. Right Ventricle: The right ventricular size is normal. Right vetricular wall thickness was not assessed. Right ventricular systolic function is normal. Tricuspid regurgitation signal is inadequate for assessing PA pressure. Left Atrium:  Left atrial size was normal in size. Right Atrium: Right atrial size was normal in size. Pericardium: There is no evidence of pericardial effusion. Mitral Valve: The mitral valve is degenerative in appearance. Mild mitral valve regurgitation. Mild mitral valve stenosis. MV peak gradient, 8.0 mmHg. The mean mitral valve gradient is 3.0 mmHg. Tricuspid Valve: The tricuspid valve is normal in structure. Tricuspid valve regurgitation is trivial. Aortic Valve: The aortic valve is tricuspid. Aortic valve regurgitation is not visualized. No aortic stenosis is present. Pulmonic Valve: The pulmonic valve was not well visualized. Pulmonic valve regurgitation is not visualized. Aorta: The aortic root and ascending aorta are structurally normal, with no evidence of dilitation. Venous: The inferior vena cava is normal in size with greater than 50% respiratory variability, suggesting right atrial pressure of 3 mmHg. IAS/Shunts: The interatrial septum was not well visualized.  LEFT VENTRICLE PLAX 2D LVIDd:         3.49 cm     Diastology LVIDs:         2.25 cm     LV e' medial:    4.79 cm/s LV PW:         1.41 cm     LV E/e' medial:  25.3 LV IVS:        1.40 cm     LV e' lateral:   10.40 cm/s LVOT diam:     1.90 cm     LV E/e' lateral: 11.6 LV SV:         68 LV SV Index:   41 LVOT Area:     2.84 cm  LV Volumes (MOD) LV vol d, MOD A2C: 54.9 ml LV vol d, MOD A4C: 50.8 ml LV vol s, MOD A2C: 25.5 ml LV vol s, MOD A4C: 22.0 ml LV SV MOD A2C:     29.4 ml LV SV MOD A4C:     50.8 ml LV SV MOD BP:      30.7 ml RIGHT VENTRICLE             IVC RV S prime:     12.90 cm/s  IVC diam: 2.02 cm TAPSE (M-mode): 2.3 cm LEFT ATRIUM           Index       RIGHT ATRIUM          Index LA diam:      4.10 cm 2.45 cm/m  RA Area:     8.55 cm LA Vol (A2C): 24.4 ml 14.59 ml/m RA Volume:   16.70 ml 9.99 ml/m LA Vol (A4C): 36.7 ml 21.95 ml/m  AORTIC VALVE LVOT Vmax:   115.00 cm/s LVOT Vmean:  82.200 cm/s LVOT VTI:    0.240 m  AORTA Ao Root diam: 2.90 cm Ao  Asc diam:  2.60 cm MITRAL VALVE MV Area (PHT): 3.85 cm     SHUNTS MV Peak grad:  8.0 mmHg     Systemic VTI:  0.24 m MV  Mean grad:  3.0 mmHg     Systemic Diam: 1.90 cm MV Vmax:       1.41 m/s MV Vmean:      83.9 cm/s MV Decel Time: 197 msec MV E velocity: 121.00 cm/s MV A velocity: 137.00 cm/s MV E/A ratio:  0.88 Oswaldo Milian MD Electronically signed by Oswaldo Milian MD Signature Date/Time: 10/04/2020/12:46:18 PM    Final    IR PERCUTANEOUS ART THROMBECTOMY/INFUSION INTRACRANIAL INC DIAG ANGIO  Result Date: 10/04/2020 INDICATION: 74 year old female with a history of acute stroke with left-sided symptoms, presents for attempt at thrombectomy EXAM: ULTRASOUND-GUIDED ACCESS RIGHT COMMON FEMORAL ARTERY CERVICAL AND CEREBRAL ANGIOGRAM MECHANICAL THROMBECTOMY RIGHT MCA TERRITORY ANGIO-SEAL FOR HEMOSTASIS COMPARISON:  CT imaging of the same day MEDICATIONS: None ANESTHESIA/SEDATION: The anesthesia team was present to provide general endotracheal tube anesthesia and for patient monitoring during the procedure. Intubation was performed in negative pressure [place] in neuro IR holding. Left radial arterial line was performed by the anesthesia team. Interventional neuro radiology nursing staff was also present. CONTRAST:  110 cc FLUOROSCOPY TIME:  Fluoroscopy Time: 22 minutes 36 seconds (841 mGy). COMPLICATIONS: SIR LEVEL B - Normal therapy, includes overnight admission for observation., Subarachnoid hemorrhage TECHNIQUE: Informed written consent was obtained from the patient's family after a thorough discussion of the procedural risks, benefits and alternatives. Specific risks discussed include: Bleeding, infection, contrast reaction, kidney injury/failure, need for further procedure/surgery, arterial injury or dissection, embolization to new territory, intracranial hemorrhage (10-15% risk), neurologic deterioration, cardiopulmonary collapse, death. All questions were addressed. Maximal Sterile Barrier  Technique was utilized including during the procedure including caps, mask, sterile gowns, sterile gloves, sterile drape, hand hygiene and skin antiseptic. A timeout was performed prior to the initiation of the procedure. The anesthesia team was present to provide general endotracheal tube anesthesia and for patient monitoring during the procedure. Interventional neuro radiology nursing staff was also present. FINDINGS: Initial Findings: Right common carotid artery:  Normal course caliber and contour. Right external carotid artery: Patent with antegrade flow. Right internal carotid artery: Normal course caliber and contour of the cervical portion. Vertical and petrous segment patent with normal course caliber contour. Cavernous segment patent. Clinoid segment patent. Antegrade flow of the ophthalmic artery. Ophthalmic segment patent. Terminus patent. Right MCA: M1 segment patent. The M1 bifurcates to superior and inferior divisions at the insula, occlusion of the inferior division just after the branch point. Inferior division is the target: TICI 0: No perfusion or antegrade flow beyond site of occlusion Right ACA: A 1 segment patent. The A2 segment perfuses both the right and left ACA territory, with apparent azygos configuration. Completion Findings: Right MCA: M1 patent. No embolism new territory. Restoration of flow through the artery of interest, inferior division TICI 3: Complete perfusion of the territory PROCEDURE: The anesthesia team was present to provide general endotracheal tube anesthesia and for patient monitoring during the procedure. Intubation was performed in negative pressure [place] in neuro IR holding. Interventional neuro radiology nursing staff was also present. Ultrasound survey of the right inguinal region was performed with images stored and sent to PACs. 11 blade scalpel was used to make a small incision. Blunt dissection was performed with US guidance. A micropuncture needle was used access  the right common femoral artery under ultrasound. With excellent arterial blood flow returned, an .018 micro wire was passed through the needle, observed to enter the abdominal aorta under fluoroscopy. The needle was removed, and a micropuncture sheath was placed over the wire. The inner dilator  and wire were removed, and an 035 wire was advanced under fluoroscopy into the abdominal aorta. The sheath was removed and a 25cm 54F straight vascular sheath was placed. The dilator was removed and the sheath was flushed. Sheath was attached to pressurized and heparinized saline bag for constant forward flow. A coaxial system was then advanced over the 035 wire. This included a 95cm 087 "Walrus" balloon guide with coaxial 125cm Berenstein diagnostic catheter. This was advanced to the proximal descending thoracic aorta. Wire was then removed. Double flush of the catheter was performed. Catheter was then used to select the innominate artery. Angiogram was performed. Using roadmap technique, the catheter was advanced over a standard glide wire into right cervical ICA, with distal position achieved of the balloon guide. The diagnostic catheter and the wire were removed. Formal angiogram was performed. Road map function was used once the occluded vessel was identified. Copious back flush was performed and the balloon catheter was attached to heparinized and pressurized saline bag for forward flow. A second coaxial system was then advanced through the balloon catheter, which included the selected intermediate catheter, microcatheter, and microwire. In this scenario, the set up included a 132cm CAT-6 intermediate catheter, a Trevo Provue18 microcatheter, and 014 synchro standard wire. This system was advanced through the balloon guide catheter under the road-map function, with adequate back-flush at the rotating hemostatic valve at that back end of the balloon guide. . Microcatheter and the intermediate catheter system were  advanced through the terminal ICA and MCA to the level of the occlusion. The micro wire was then carefully advanced through the occluded segment. Microcatheter was then manipulated through the occluded segment and the wire was removed with saline drip at the hub. Blood was then aspirated through the hub of the microcatheter, and a gentle contrast injection was performed confirming intraluminal position. A rotating hemostatic valve was then attached to the back end of the microcatheter, and a pressurized and heparinized saline bag was attached to the catheter. 75mm Trevo device was then selected. Back flush was achieved at the rotating hemostatic valve, and then the device was gently advanced through the microcatheter to the distal end. The retriever was then unsheathed by withdrawing the microcatheter under fluoroscopy. Once the retriever was completely unsheathed, the microcatheter was carefully stripped from the delivery device. Control angiogram was performed from the intermediate catheter. Occlusion persisted at the inferior division. We elected to attempt a second pass. On the second pass, slight alteration was performed with the second coaxial system. In this next scenario, the coaxial set up included a 132cm CAT-5 intermediate catheter, a Trevo Provue18 microcatheter, and 014 synchro standard wire. This system was advanced through the balloon guide catheter under the road-map function, with adequate back-flush at the rotating hemostatic valve at that back end of the balloon guide. Microcatheter and the intermediate catheter system were advanced through the terminal ICA and MCA to the level of the occlusion. The micro wire was then carefully advanced through the occluded segment. Microcatheter was then manipulated through the occluded segment and the wire was removed with saline drip at the hub. On this attempt, the microcatheter and microwire were recognized to enter a more cephalad branch, the angular artery  rather than the position on the first attempt, which was a more caudal branch. Blood was then aspirated through the hub of the microcatheter, and a gentle contrast injection was performed confirming intraluminal position. A rotating hemostatic valve was then attached to the back end of the microcatheter,  and a pressurized and heparinized saline bag was attached to the catheter. 60mm Trevo device was then again used. Back flush was achieved at the rotating hemostatic valve, and then the device was gently advanced through the microcatheter to the distal end. The retriever was then unsheathed by withdrawing the microcatheter under fluoroscopy. Once the retriever was completely unsheathed, the microcatheter was carefully stripped from the delivery device. Control angiogram was performed from the intermediate catheter. A 3 minute time interval was observed. The balloon at the balloon guide catheter was then inflated under fluoroscopy for proximal flow arrest. Constant aspiration using the proprietary engine was then performed at the intermediate catheter, as the retriever was gently and slowly withdrawn with fluoroscopic observation. Once the retriever was "corked" within the tip of the intermediate catheter, both were removed from the system. Free aspiration was confirmed at the hub of the balloon guide catheter, with free blood return confirmed. The balloon was then deflated, and a control angiogram was performed. Occlusion persisted within the inferior division, with TICI 0 flow. We elected to attempt a third pass. In this third pass scenario we again altered the coaxial system. The set up included a 137cm zoom 71 intermediate catheter, a Trevo Provue18 microcatheter, and 014 synchro standard wire. This system was advanced through the balloon guide catheter under the road-map function, with adequate back-flush at the rotating hemostatic valve at that back end of the balloon guide. Microcatheter and the intermediate  catheter system were advanced through the terminal ICA and MCA to the level of the occlusion. The micro wire was then carefully advanced through the occluded segment. Microcatheter was then manipulated through the occluded segment and the wire was removed with saline drip at the hub. The wire again entered the angular branch on this occasion. Blood was then aspirated through the hub of the microcatheter, and a gentle contrast injection was performed confirming intraluminal position. A rotating hemostatic valve was then attached to the back end of the microcatheter, and a pressurized and heparinized saline bag was attached to the catheter. On this attempt, a 4 mm solitaire device was selected. Back flush was achieved at the rotating hemostatic valve, and then the device was gently advanced through the microcatheter to the distal end. The retriever was then unsheathed by withdrawing the microcatheter under fluoroscopy. Once the retriever was completely unsheathed, the microcatheter was carefully stripped from the delivery device. Control angiogram was performed from the intermediate catheter. A 3 minute time interval was observed. The balloon at the balloon guide catheter was then inflated under fluoroscopy for proximal flow arrest. Constant aspiration using the proprietary engine was then performed at the intermediate catheter, as the retriever was gently and slowly withdrawn with fluoroscopic observation. Once the retriever was "corked" within the tip of the intermediate catheter, both were removed from the system. Free aspiration was confirmed at the hub of the balloon guide catheter, with free blood return confirmed. The balloon was then deflated, and a control angiogram was performed. Restoration of flow was confirmed. Angiogram of the cervical ICA was performed. Balloon guide was then removed. The skin at the puncture site was then cleaned with Chlorhexidine. The 8 French sheath was removed and an 85F angioseal  was deployed. Flat panel CT was performed. Patient was extubated once the CT was reviewed with neurology team. Patient tolerated the procedure well and remained hemodynamically stable throughout. Blood loss estimated 100 cc IMPRESSION: Status post ultrasound guided access right common femoral artery for cervical/cerebral angiogram and mechanical thrombectomy  of right MCA M2 inferior branch division, achieving TICI 3 flow after 3 passes of combination therapy using stent retriever and local aspiration on each attempt. Small volume subarachnoid hemorrhage in the sylvian fissure at the completion of the exam. Angio-Seal for hemostasis. Signed, Dulcy Fanny. Dellia Nims, [facility] Radiology PLAN: Patient was extubated. ICU status Target systolic blood pressure of 120-140 Right hip straight time 6 hours Frequent neurovascular checks Repeat neurologic imaging with CT and/MRI at the discretion of neurology team Electronically Signed   By: Corrie Mckusick D.O.   On: 10/04/2020 08:18   CT HEAD CODE STROKE WO CONTRAST  Result Date: 10/02/2020 CLINICAL DATA:  Code stroke.  Left-sided weakness. EXAM: CT HEAD WITHOUT CONTRAST TECHNIQUE: Contiguous axial images were obtained from the base of the skull through the vertex without intravenous contrast. COMPARISON:  04/02/2020 FINDINGS: Brain: There is no evidence of an acute infarct, intracranial hemorrhage, mass, midline shift, or extra-axial fluid collection. 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 Chronic lacunar infarcts in the right corona radiata and cerebellum are also unchanged. The ventricles are normal in size. Vascular: Calcified atherosclerosis at the skull base. Hyperdense right MCA in the region of the bifurcation. Skull: No fracture or suspicious osseous lesion. Sinuses/Orbits: Prior left-sided functional endoscopic sinus surgery. Paranasal sinuses and mastoid air cells  are clear. Bilateral cataract extraction. Rightward gaze. Other: None. ASPECTS [facility] Stroke Program Early CT Score) - Ganglionic level infarction (caudate, lentiform nuclei, internal capsule, insula, M1-M3 cortex): 7 - Supraganglionic infarction (M4-M6 cortex): 3 Total score (0-10 with 10 being normal): 10 IMPRESSION: 1. No acute infarct or intracranial hemorrhage identified. 2. ASPECTS is 10. 3. Hyperdense right MCA which will be evaluated on pending CTA. 4. Multiple chronic infarcts. These results were communicated to Dr. Leonel Ramsay at 7:23 pm on 10/02/2020 by text page via the [facility] messaging system. Electronically Signed   By: Logan Bores M.D.   On: 10/02/2020 19:26     Assessment and Plan:   Afib RVR with history of PAF - diagnosed 2017 by loop recorder after stroke.  - Now patient admitted for recurrent stroke. No tPA due to Eliquis. S/p thrombectomy and had small volume SAH and Eliquis held.  - Afib RVR this morning given IV metoprolol and started on IV amiodarone.  - IV amio drip and metoprolol 12.5mg  Q6H - Heart rates now in the 80s. She is asymptomatic - BB previously discontinued due to bradycardia and generally feeling poorly - Heart monitor earlier this month showing NSR with 1% afib burden and recurrent SVT. Afib heart rates in the 120s. Recommendations for rate control strategy however difficult given tachybrady syndrome. At the last visit notes suggest patient might need pacing or ablation. EP aware of patient.  - No AC until [facility] resolves 7-10 days post stroke  Acute CVA/Hx of multiple TIA - CTA H&N showed emergent prox right M2 occlusion s/p IR thrombectomy. Small volume SAH and Eliquis held.  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  Agree with above as stated.    She restless and annoyed by the mittens.   Rate control improved on current meds.  Off of anticoagulation for [facility], post thrombectomy for presumed embolic event.   EP to see tomorrow.  Consider Watchman going forward.   Larae Grooms

## 2020-10-05 NOTE — Progress Notes (Signed)
Physical Therapy Treatment Patient Details Name: Erin Yates MRN: [id number] DOB: 1945/11/22 Today's Date: 10/05/2020    History of Present Illness 74 y.o. female admitted on 10/02/20 for L sided weakness.  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  Pt with significant PMH of previous stroke with residual visual deficits (peripheral vision loss), but they do not hinder her function,PFO s/p closure, HTN, DM, anxiety, anemia, loop recorder placement.      PT Comments    Pt restless upon PT arrival to room, eager to get OOB and go to toilet. Pt requiring min+2 for mobility overall, pt reaching for environment if not provided with bilateral UE support in standing. Pt limited in activity tolerance due to HR in afib rhythm with HRmax 160 bpm. PT to continue to follow acutely.    Follow Up Recommendations  CIR     Equipment Recommendations  None recommended by PT    Recommendations for Other Services       Precautions / Restrictions Precautions Precautions: Fall Precaution Comments: new onset afib with RVR, RN okayed OOB Restrictions Weight Bearing Restrictions: No    Mobility  Bed Mobility Overal bed mobility: Needs Assistance Bed Mobility: Supine to Sit     Supine to sit: Supervision;HOB elevated Sit to supine: Supervision;HOB elevated   General bed mobility comments: for safety, increased time  Transfers Overall transfer level: Needs assistance Equipment used: 2 person hand held assist Transfers: Sit to/from Stand Sit to Stand: Min assist;+2 safety/equipment         General transfer comment: min +2 for power up, pt reaching for bilateral UE support. STS x3, from EOB x2 and from toilet.  Ambulation/Gait Ambulation/Gait assistance: Min assist;+2 safety/equipment Gait Distance (Feet): 15 Feet (x2, to and from toilet) Assistive device: 2 person hand held assist;1 person hand held assist;IV Pole Gait Pattern/deviations:  Step-through pattern;Decreased stride length;Shuffle;Trunk flexed Gait velocity: decr   General Gait Details: Min +2 for steadying, guiding pt trajectory. Verbal cuing for taking larger steps, navigating room. Pt intermittently using IV pole to steady. HR 120s-160s in afib rhythm during mobility, RN aware.   Stairs             Wheelchair Mobility    Modified Rankin (Stroke Patients Only) Modified Rankin (Stroke Patients Only) Pre-Morbid Rankin Score: Slight disability Modified Rankin: Moderately severe disability     Balance Overall balance assessment: Needs assistance Sitting-balance support: Feet unsupported;No upper extremity supported Sitting balance-Leahy Scale: Fair Sitting balance - Comments: able to sit EOB without support   Standing balance support: Single extremity supported Standing balance-Leahy Scale: Poor Standing balance comment: reliant on UE support                            Cognition Arousal/Alertness: Awake/alert Behavior During Therapy: Anxious;Restless Overall Cognitive Status: Impaired/Different from baseline Area of Impairment: Orientation;Attention;Following commands;Safety/judgement;Problem solving                 Orientation Level: Disoriented to;Place;Situation Current Attention Level: Sustained Memory: Decreased recall of precautions;Decreased short-term memory   Safety/Judgement: Decreased awareness of safety Awareness: Intellectual Problem Solving: Slow processing;Difficulty sequencing;Requires verbal cues;Requires tactile cues General Comments: Pt perseverative on needing to use the bathroom upon PT arrival, even when told several times she had external catheter. Restless, requires multimodal cuing to stay on task. Pt states she is at [place]  General Comments General comments (skin integrity, edema, etc.): HRmax 160 bpm in afib rhythm      Pertinent Vitals/Pain Pain Assessment: Faces Faces  Pain Scale: No hurt Pain Intervention(s): Limited activity within patient's tolerance;Monitored during session;Repositioned    Home Living                      Prior Function            PT Goals (current goals can now be found in the care plan section) Acute Rehab PT Goals Patient Stated Goal: to go home before her granddaughter leaves for [place] PT Goal Formulation: With patient/family Time For Goal Achievement: 10/17/20 Potential to Achieve Goals: Good Progress towards PT goals: Progressing toward goals    Frequency    Min 4X/week      PT Plan Current plan remains appropriate    Co-evaluation              AM-PAC PT "6 Clicks" Mobility   Outcome Measure  Help needed turning from your back to your side while in a flat bed without using bedrails?: A Little Help needed moving from lying on your back to sitting on the side of a flat bed without using bedrails?: A Little Help needed moving to and from a bed to a chair (including a wheelchair)?: A Little Help needed standing up from a chair using your arms (e.g., wheelchair or bedside chair)?: A Lot Help needed to walk in hospital room?: A Lot Help needed climbing 3-5 steps with a railing? : Total 6 Click Score: 14    End of Session Equipment Utilized During Treatment: Gait belt Activity Tolerance: Patient tolerated treatment well Patient left: in bed;with call bell/phone within reach;with nursing/sitter in room (RN and NT bathing and changing pt, they will set bed alarm) Nurse Communication: Mobility status;Other (comment) (HR) PT Visit Diagnosis: Muscle weakness (generalized) (M62.81);Difficulty in walking, not elsewhere classified (R26.2);Hemiplegia and hemiparesis Hemiplegia - Right/Left: Left Hemiplegia - dominant/non-dominant: Non-dominant Hemiplegia - caused by: Cerebral infarction     Time: 1013-1028 PT Time Calculation (min) (ACUTE ONLY): 15 min  Charges:  $Gait Training: 8-22 mins                      Laycie Schriner E, PT Acute Rehabilitation Services Pager [phone]  Office [phone]   Jeree Delcid D Elonda Husky 10/05/2020, 10:51 AM

## 2020-10-05 NOTE — Progress Notes (Signed)
  Speech Language Pathology Treatment: Dysphagia  Patient Details Name: Erin Yates MRN: [id number] DOB: [birth year] Today's Date: 10/05/2020 Time: [id number] SLP Time Calculation (min) (ACUTE ONLY): 13 min  Assessment / Plan / Recommendation Clinical Impression  Pt was seen for f/u to assess for diet tolerance and provide advanced trials of thin liquids. She is sleepier today compared to initial evaluation, but per RN this could be medication related. She and the pt's sitter describe her as more alert for breakfast and early morning meds, with no overt s/s of difficulty with those consistencies. SLP provided thin liquids via straw with more cueing needed to initiate sucking via straw, but with less coughing noted today. RN also had medications to administer, but pt was exhibiting more oral holding that required at least Mod cues for oral clearance. Recommend continuing current diet for now but only when pt is more fully alert. Will try more advanced trials when more alert to see if MBS is needed, as pt would be too lethargic to participate well at this time.    HPI HPI: 74 y.o. female admitted on 10/02/20 for L sided weakness.  CT scan revealed a R M2 occlusion and she underwent emergent thrombectomy in IR.  Pt with significant PMH of GERD, previous stroke with residual visual deficits (peripheral vision loss), but they do not hinder her function, PFO s/p closure, HTN, DM, anxiety, anemia, loop recorder placement. Pt was evaluated by SLP in 11/2019 with oropharyngeal swallow WFL but mild-moderate aphasia noted with receptive and expressive difficulties.      SLP Plan  Continue with current plan of care       Recommendations  Diet recommendations: Dysphagia 2 (fine chop);Nectar-thick liquid Liquids provided via: Cup;Straw Medication Administration: Whole meds with puree Supervision: Staff to assist with self feeding;Full supervision/cueing for compensatory strategies Compensations: Minimize  environmental distractions;Slow rate;Small sips/bites;Lingual sweep for clearance of pocketing;Monitor for anterior loss Postural Changes and/or Swallow Maneuvers: Seated upright 90 degrees                Oral Care Recommendations: Oral care BID Follow up Recommendations: Inpatient Rehab SLP Visit Diagnosis: Dysphagia, unspecified (R13.10) Plan: Continue with current plan of care       GO                Osie Bond., M.A. [facility] Acute Rehabilitation Services Pager [phone] Office [phone]  10/05/2020, 9:54 AM

## 2020-10-05 NOTE — Progress Notes (Signed)
STROKE TEAM PROGRESS NOTE   INTERVAL HISTORY RN and husband as well as sitter are at the bedside. Pt overnight calm, not significant agitation. However, she developed afib RVR and was put on amiodarone drip. This morning HR under control, will have cardiology chip in. She seems to have some restless again this am, put on Xanax bid and continue haldol PRN. If not able to control, will consider precedex.    OBJECTIVE Vitals:   10/05/20 0830 10/05/20 0900 10/05/20 1012 10/05/20 1155  BP:  (!) 104/54 137/83   Pulse:   (!) 142   Resp:  (!) 25 (!) 29   Temp: 99.8 F (37.7 C)   99.4 F (37.4 C)  TempSrc:      SpO2:      Weight:      Height:       CBC:  Recent Labs  Lab 10/02/20 1903 10/02/20 1909 10/04/20 0118 10/05/20 0021  WBC 9.6   < > 11.5* 13.2*  NEUTROABS 6.0  --   --   --   HGB 11.7*   < > 10.3* 11.1*  HCT 36.6   < > 30.5* 32.6*  MCV 99.7   < > 94.7 93.9  PLT 227   < > 232 255   < > = values in this interval not displayed.   Basic Metabolic Panel:  Recent Labs  Lab 10/04/20 0118 10/05/20 0021 10/05/20 0657  NA 139 140  --   K 3.7 3.9  --   CL 109 108  --   CO2 17* 17*  --   GLUCOSE 99 140*  --   BUN 11 11  --   CREATININE 1.28* 1.32*  --   CALCIUM 8.8* 8.9  --   MG  --   --  1.0*  PHOS  --   --  3.3   Lipid Panel:     Component Value Date/Time   CHOL 115 10/03/2020 0105   TRIG 442 (H) 10/03/2020 0105   HDL 35 (L) 10/03/2020 0105   CHOLHDL 3.3 10/03/2020 0105   VLDL UNABLE TO CALCULATE IF TRIGLYCERIDE OVER 400 mg/dL 10/03/2020 0105   LDLCALC UNABLE TO CALCULATE IF TRIGLYCERIDE OVER 400 mg/dL 10/03/2020 0105   HgbA1c:  Lab Results  Component Value Date   HGBA1C 6.2 (H) 10/03/2020   Urine Drug Screen: No results found for: LABOPIA, COCAINSCRNUR, LABBENZ, AMPHETMU, THCU, LABBARB  Alcohol Level     Component Value Date/Time   ETH <10 10/03/2020 0105    IMAGING  CT HEAD CODE STROKE WO CONTRAST 10/02/2020 1. No acute infarct or intracranial  hemorrhage identified.  2. ASPECTS is 10.  3. Hyperdense right MCA which will be evaluated on pending CTA.  4. Multiple chronic infarcts.   CT Code Stroke CTA Head W/WO contrast CT Code Stroke CTA Neck W/WO contrast 10/02/2020 1. Emergent proximal right M2 occlusion.  2. Mild carotid and vertebral atherosclerosis without significant stenosis.  3.  Aortic Atherosclerosis (ICD10-I70.0).   Cerebral Angio / IR 10/02/2020 R M2 occlusion (TICI0). 1st pass-Trevo 38mm & CAT 6 w/ aspiration. 2nd pass-Trevo 35mm & CAT 5 w/ aspiration. 3rd pass-Solitaire 71mm & Zoom 71 w/ aspiration. Final TICI3.   Post IR CT 10/02/2020 Small volume SAH at the sylvian fissure and right perisylvian sulci         CT HEAD WO CONTRAST 10/03/2020 Nondiagnostic evaluation due to patient intolerance and motion. The technologist was unable to perform a repeat exam due to safety Concern.  CT  HEAD WO CONTRAST 10/04/2020  Severely limited study with findings consistent with an evolving posterior right MCA territory infarct with progressive edema. Thereis hyperdensity in the region of the right sylvian fissure, concerning for acute subarachnoid hemorrhage but suboptimally evaluated. There is sulcal effacement without substantial midline shift.  Transthoracic Echocardiogram  10/04/2020 1. Left ventricular ejection fraction, by estimation, is 60 to 65%. The left ventricle has normal function. The left ventricle has no regional wall motion abnormalities. 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 Left ventricular diastolic parameters are indeterminate.  2. Right ventricular systolic function is normal. The right ventricular size is normal. Tricuspid regurgitation signal is inadequate for assessing PA pressure.  3. The mitral valve is degenerative. Mild mitral valve regurgitation. Mild mitral stenosis. 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  4. The aortic valve is tricuspid. Aortic valve regurgitation is not visualized. No aortic stenosis is present.  5. The inferior vena cava is normal in size with greater than 50% respiratory variability, suggesting right atrial pressure of 3 mmHg.   ECG - Sinus rhythm with Premature supraventricular complexes; Left anterior fascicular block; Minimal voltage criteria for LVH, may be normal variant ( R in aVL )  PHYSICAL EXAM   Temp:  [98.3 F (36.8 C)-99.9 F (37.7 C)] 99.4 F (37.4 C) (11/30 1155) Pulse Rate:  [56-142] 142 (11/30 1012) Resp:  [17-33] 29 (11/30 1012) BP: (103-182)/(51-109) 137/83 (11/30 1012) SpO2:  [95 %-100 %] 98 % (11/30 0800)  General - Well nourished, well developed, slight restless.  Ophthalmologic - fundi not visualized due to noncooperation.  Cardiovascular - irregularly irregular heart rate and rhythm  Neuro - awake, alert, slightly restless, on restrain with posey belt. Orientated to place and people but not to age or time. No aphasia but paucity of speech, not cooperative with naming or repeating. Following simple commands but needed repeated request. B/l visual acuity decreased, able to see finger counting in the central vision, but poor visual acuity at peripheral bilaterally, but able to see hand waving on the right. No gaze palsy but right gaze preference and left hemianopia. Left facial droop, tongue protrusion not cooperative. Left UE 4-/5 with mild drift, RUE at least 4+/5. LLE 4+/5 and RLE 4/5 with just off the restrain with mild pain. Sensation, coordination not cooperative and gait not tested.   ASSESSMENT/PLAN Erin Yates is a 74 y.o. female with history of diabetes, hypothyroidism, ongoing tobacco use, HTN, HLD, hx of PFO closure, implantable loop, hypertension, atrial fibrillation on eliquis as well as multiple previous strokes (residual visual deficits) who  presents with left-sided weakness, a fall, rightward gaze, and confusion. She did not receive IV t-PA due to anticoagulation with Eliquis. 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 However, pt does have multiple stroke risk factors.   CT Head -  No acute infarct or intracranial hemorrhage identified. ASPECTS is 10. Hyperdense right MCA. Multiple chronic infarcts.   CTA H&N - Emergent proximal right M2 occlusion. Mild carotid and vertebral atherosclerosis without significant stenosis.   IR TICI3 of right M2 occlusion, Small volume SAH at the sylvian fissure and right perisylvian sulci    CT head 11/29 an evolving posterior  right MCA territory infarct with progressive edema. There is hyperdensity in the region of the right sylvian fissure  MRI and MRA head - hold off due to agitation and noncooperation  2D Echo - EF 60-65.%. degenerative MV w/ echodensity base posterior leaflet c/w severe P3 prolapse  Lacey Jensen Virus 2 - negative  TG 442, LDL - 44  HgbA1c - 6.2  VTE prophylaxis - SCDs  Eliquis (apixaban) daily prior to admission, now on No antithrombotic due to small SAH.   Therapy recommendations:  CIR  Disposition:  Pending  Hx stroke/TIA  09/2007 with RLE hp  11/2007 with LLE numbness  01/2008 probable stroke with RUE hp  07/2010 L occipital and R basal ganglia infarct  PFO diagnosed 2009, underwent closure 04/2009 by Dr. Jenne Pane at [facility]  02/2016 left hemianopia status post TPA.  MRI showed right parietal and temporal infarcts.  CT head and neck right P3 occlusion.  EF 60 to 65%.  TEE showed PFO closure device in place.  LDL 79 A1c 6.4.  Discharged with DAPT.  11/2019 admitted for aphasia.  MRI showed left parietal lobe infarct.  MRI high-grade stenosis left A1, moderate stenosis right P2. Loop  recorder positive for A. fib at that time, put on Eliquis.  Follows [facility] Leonie [facility]  PAF->AF w/ RVR Tachypalpitation  Follow with Dr. [name] cardiology  Loop recorder showed subclinical A. Fib  On Eliquis PTA, compliant per husband  On recent cardiac event monitoring with Dr. [name]  No AC at the time until [facility] resolves and 7-10 days post stroke  RVR during the night. Placed on amio gtt w/ metoprolol IV and po.   bolused amio as still w/ transient RVR -> now under control  Cardiology consulted  Agitation/confusion/delirium/vascular dementia  Likely due to bilateral acute and chronic infarcts  On four-point restraint  Not able to get MRI  EKG no QT prolongation  On haldol and ativan PRN  On Seroquel 25 bid  Resume home wellbutrin, effexor  Restless 11/30 w/o sedation all night in AF RVR.   Resumed home xanax 0.5 bid prn  May consider precedex if needed  Hypertension  Home BP meds: Norvasc ; Cozaar  Current BP meds: off Cleviprex   D/c norvasc 10  On metoprolol 12.5 Q6  Stable on the low end  SBP goal < 160  . Long-term BP goal normotensive  Hyperlipidemia  Home Lipid lowering medication: Lipitor 80 mg daily   LDL 44, LDL goal < 70  Resumed Lipitor 80  Continue statin at discharge  Diabetes  Home diabetic meds: metformin  Current diabetic meds: SSI   HgbA1c 6.2, goal < 7.0  CBG monitoring  PCP follow-up  Dysphagia  Failed bedside swallow screen due to coughing and water  Speech on board  Cleared for D2 Nectar thick liquids  On IV fluid @ 40cc  Tobacco abuse  Current smoker  Smoking cessation counseling provided  Nicotine patch provided  Pt is willing to quit  Other Stroke Risk Factors  Advanced age  Hx PFO s/p closure  Other Active Problems Code status - Full code  CKD - stage 3b - Cre- 1.60->1.30->1.28->1.32  Aortic Atherosclerosis (ICD10-I70.0).   Elevated Trop 569->804 EKG ok.   Hypomagnesemia Mg  1.0  Hospital day # 3  This patient is critically ill due to right MCA infarct, delirium, SAH, afib RVR and at significant risk of neurological worsening, death form heart failure, recurrent stroke, hemorrhagic conversion, SAH extension, psychosis. This  patient's care requires constant monitoring of vital signs, hemodynamics, respiratory and cardiac monitoring, review of multiple databases, neurological assessment, discussion with family, other specialists and medical decision making of high complexity. I spent 35 minutes of neurocritical care time in the care of this patient. I had long discussion with husband at bedside, updated pt current condition, treatment plan and potential prognosis, and answered all the questions. He expressed understanding and appreciation.    Rosalin Hawking, MD PhD Stroke Neurology 10/05/2020 12:02 PM    To contact Stroke Continuity provider, please refer to [email or web address] After hours, contact General Neurology

## 2020-10-05 NOTE — Progress Notes (Addendum)
HR in the 160s, sustained.  Exam at baseline, mildly agitated but redirectable  # Afib w/ RVR  - IV metop 5 mg x 2 doses given, with resolution of elevated HR  - 12.5 mg PO metop q6hr to maintain normal rate   20 minutes of critical care time in this patient's care today (no charge given does not meet threshold for my direct care)

## 2020-10-05 NOTE — Anesthesia Postprocedure Evaluation (Addendum)
Anesthesia Post Note  Patient: Erin Yates  Procedure(s) Performed: IR WITH ANESTHESIA (N/A )     Patient location during evaluation: SICU Anesthesia Type: General Level of consciousness: awake and patient cooperative Pain management: pain level controlled Vital Signs Assessment: post-procedure vital signs reviewed and stable Respiratory status: spontaneous breathing, respiratory function stable, nonlabored ventilation and patient connected to face mask oxygen Cardiovascular status: stable Postop Assessment: no apparent nausea or vomiting Anesthetic complications: no (lost two front teeth)   No complications documented.  Last Vitals:  Vitals:   10/05/20 0800 10/05/20 0830  BP: (!) 155/66   Pulse: 70   Resp: 17   Temp:  37.7 C  SpO2: 98%     Last Pain:  Vitals:   10/05/20 0800  TempSrc:   PainSc: 0-No pain                 Yeraldine Forney

## 2020-10-05 NOTE — Progress Notes (Signed)
[place] Progress Note Patient Name: Erin Yates DOB: 1946-07-10 MRN: [id number]   Date of Service  10/05/2020  HPI/Events of Note  Patient again in afib RVR 140s. BP 154/98 This resolved earlier with metoprolol IV No significant abnormality to this morning's labs  eICU Interventions  Re-ordered metoprolol 5 mg IV x 3 doses for HR Add on Mg and Phos level If persistent afib RVR will start amiodarone     Intervention Category Major Interventions: Arrhythmia - evaluation and management  Shona Needles Tranise Forrest 10/05/2020, 4:09 AM

## 2020-10-05 NOTE — Progress Notes (Signed)
Notified Neurology regarding pt's A fib w/ RVR.

## 2020-10-06 DIAGNOSIS — I4821 Permanent atrial fibrillation: Secondary | ICD-10-CM | POA: Diagnosis not present

## 2020-10-06 DIAGNOSIS — R001 Bradycardia, unspecified: Secondary | ICD-10-CM

## 2020-10-06 DIAGNOSIS — I10 Essential (primary) hypertension: Secondary | ICD-10-CM | POA: Diagnosis not present

## 2020-10-06 DIAGNOSIS — I480 Paroxysmal atrial fibrillation: Secondary | ICD-10-CM

## 2020-10-06 DIAGNOSIS — E1165 Type 2 diabetes mellitus with hyperglycemia: Secondary | ICD-10-CM | POA: Diagnosis not present

## 2020-10-06 DIAGNOSIS — Z720 Tobacco use: Secondary | ICD-10-CM

## 2020-10-06 DIAGNOSIS — E876 Hypokalemia: Secondary | ICD-10-CM | POA: Diagnosis present

## 2020-10-06 DIAGNOSIS — I639 Cerebral infarction, unspecified: Secondary | ICD-10-CM | POA: Diagnosis present

## 2020-10-06 DIAGNOSIS — E119 Type 2 diabetes mellitus without complications: Secondary | ICD-10-CM | POA: Diagnosis present

## 2020-10-06 DIAGNOSIS — Z794 Long term (current) use of insulin: Secondary | ICD-10-CM

## 2020-10-06 DIAGNOSIS — I4891 Unspecified atrial fibrillation: Secondary | ICD-10-CM | POA: Diagnosis present

## 2020-10-06 DIAGNOSIS — I69391 Dysphagia following cerebral infarction: Secondary | ICD-10-CM | POA: Diagnosis not present

## 2020-10-06 LAB — BASIC METABOLIC PANEL
Anion gap: 13 (ref 5–15)
BUN: 13 mg/dL (ref 8–23)
CO2: 18 mmol/L — ABNORMAL LOW (ref 22–32)
Calcium: 8.4 mg/dL — ABNORMAL LOW (ref 8.9–10.3)
Chloride: 108 mmol/L (ref 98–111)
Creatinine, Ser: 1.33 mg/dL — ABNORMAL HIGH (ref 0.44–1.00)
GFR, Estimated: 42 mL/min — ABNORMAL LOW (ref >60)
Glucose, Bld: 115 mg/dL — ABNORMAL HIGH (ref 70–99)
Potassium: 3.1 mmol/L — ABNORMAL LOW (ref 3.5–5.1)
Sodium: 139 mmol/L (ref 135–145)

## 2020-10-06 LAB — CBC
HCT: 30.5 % — ABNORMAL LOW (ref 36.0–46.0)
Hemoglobin: 10.4 g/dL — ABNORMAL LOW (ref 12.0–15.0)
MCH: 32.2 pg (ref 26.0–34.0)
MCHC: 34.1 g/dL (ref 30.0–36.0)
MCV: 94.4 fL (ref 80.0–100.0)
Platelets: 228 10*3/uL (ref 150–400)
RBC: 3.23 MIL/uL — ABNORMAL LOW (ref 3.87–5.11)
RDW: 12.8 % (ref 11.5–15.5)
WBC: 8.4 10*3/uL (ref 4.0–10.5)
nRBC: 0 % (ref 0.0–0.2)

## 2020-10-06 LAB — GLUCOSE, CAPILLARY
Glucose-Capillary: 110 mg/dL — ABNORMAL HIGH (ref 70–99)
Glucose-Capillary: 122 mg/dL — ABNORMAL HIGH (ref 70–99)
Glucose-Capillary: 207 mg/dL — ABNORMAL HIGH (ref 70–99)
Glucose-Capillary: 115 mg/dL — ABNORMAL HIGH (ref 70–99)
Glucose-Capillary: 246 mg/dL — ABNORMAL HIGH (ref 70–99)

## 2020-10-06 MED ORDER — POTASSIUM CHLORIDE CRYS ER 20 MEQ PO TBCR
40.0000 meq | EXTENDED_RELEASE_TABLET | ORAL | Status: AC
  Administered 2020-10-06 (×2): 40 meq via ORAL
  Filled 2020-10-06 (×2): qty 2

## 2020-10-06 MED ORDER — LOSARTAN POTASSIUM 50 MG PO TABS
50.0000 mg | ORAL_TABLET | Freq: Every day | ORAL | Status: DC
  Administered 2020-10-06 – 2020-10-07 (×2): 50 mg via ORAL
  Filled 2020-10-06: qty 1

## 2020-10-06 MED ORDER — MAGNESIUM SULFATE 4 GM/100ML IV SOLN
4.0000 g | Freq: Once | INTRAVENOUS | Status: AC
  Administered 2020-10-06: 4 g via INTRAVENOUS
  Filled 2020-10-06: qty 100

## 2020-10-06 MED ORDER — HYDRALAZINE HCL 20 MG/ML IJ SOLN
10.0000 mg | Freq: Once | INTRAMUSCULAR | Status: AC
  Administered 2020-10-06: 10 mg via INTRAVENOUS
  Filled 2020-10-06: qty 1

## 2020-10-06 MED ORDER — AMLODIPINE BESYLATE 10 MG PO TABS
10.0000 mg | ORAL_TABLET | Freq: Every evening | ORAL | Status: DC
  Administered 2020-10-06 – 2020-10-11 (×6): 10 mg via ORAL
  Filled 2020-10-06 (×7): qty 1

## 2020-10-06 MED ORDER — INSULIN ASPART 100 UNIT/ML ~~LOC~~ SOLN
0.0000 [IU] | Freq: Three times a day (TID) | SUBCUTANEOUS | Status: DC
  Administered 2020-10-06: 2 [IU] via SUBCUTANEOUS
  Administered 2020-10-07 – 2020-10-11 (×5): 1 [IU] via SUBCUTANEOUS

## 2020-10-06 MED ORDER — ASPIRIN EC 81 MG PO TBEC
81.0000 mg | DELAYED_RELEASE_TABLET | Freq: Every day | ORAL | Status: DC
  Administered 2020-10-06 – 2020-10-09 (×4): 81 mg via ORAL
  Filled 2020-10-06 (×4): qty 1

## 2020-10-06 NOTE — Progress Notes (Signed)
Physical Therapy Treatment Patient Details Name: Erin Yates MRN: [id number] DOB: 08-02-1946 Today's Date: 10/06/2020    History of Present Illness 74 y.o. female admitted on 10/02/20 for L sided weakness.  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  Pt with significant PMH of previous stroke with residual visual deficits (peripheral vision loss), but they do not hinder her function,PFO s/p closure, HTN, DM, anxiety, anemia, loop recorder placement.      PT Comments    Pt much calmer upon PT arrival to room today vs yesterday, and is eager to mobilize OOB. Pt with improved activity tolerance today, ambulating to bathroom and in hallway. Pt requiring seated rest break due to fatigue, and demonstrates gait deviations including narrow BOS with scissoring, shuffling steps, and unsteadiness requiring BL UE support. Pt having trouble attending to PT cues during gait, requires standing rest to listen and apply cues. PT continuing to recommend CIR post-acutely given deficits and to maximize pt functional recovery.     Follow Up Recommendations  CIR     Equipment Recommendations  None recommended by PT    Recommendations for Other Services       Precautions / Restrictions Precautions Precautions: Fall Restrictions Weight Bearing Restrictions: No    Mobility  Bed Mobility Overal bed mobility: Needs Assistance Bed Mobility: Supine to Sit     Supine to sit: Supervision;HOB elevated     General bed mobility comments: for safety, increased time  Transfers Overall transfer level: Needs assistance Equipment used: 2 person hand held assist Transfers: Sit to/from Stand Sit to Stand: Min guard         General transfer comment: min guard for safety, pt reaching for SL support and once standing reaching for BL UE support.  Ambulation/Gait Ambulation/Gait assistance: Min assist;+2 safety/equipment;+2 physical assistance Gait Distance (Feet): 40  Feet (x2) Assistive device: 2 person hand held assist Gait Pattern/deviations: Step-through pattern;Decreased stride length;Shuffle;Trunk flexed;Narrow base of support;Scissoring Gait velocity: decr   General Gait Details: Min +2 to steady, guide pt trajectory, correct mild LOB. Pt with x1 scissoring and multiple near scissoring moments, PT cuing for widening BOS (requires frequent verbal and demonstrative cues to understand) and upright posture. HR <100 BPM   Stairs             Wheelchair Mobility    Modified Rankin (Stroke Patients Only) Modified Rankin (Stroke Patients Only) Pre-Morbid Rankin Score: Slight disability Modified Rankin: Moderately severe disability     Balance Overall balance assessment: Needs assistance Sitting-balance support: Feet unsupported;No upper extremity supported Sitting balance-Leahy Scale: Fair Sitting balance - Comments: able to sit EOB without support   Standing balance support: Bilateral upper extremity supported Standing balance-Leahy Scale: Poor Standing balance comment: reliant on UE support                            Cognition Arousal/Alertness: Awake/alert Behavior During Therapy: WFL for tasks assessed/performed Overall Cognitive Status: Impaired/Different from baseline Area of Impairment: Following commands                   Current Attention Level: Sustained Memory: Decreased short-term memory Following Commands: Follows multi-step commands inconsistently Safety/Judgement: Decreased awareness of safety;Decreased awareness of deficits   Problem Solving: Slow processing;Difficulty sequencing;Requires verbal cues;Requires tactile cues General Comments: Pt demonstrating difficulty following cues for modifying gait, especially in real time. Pt with lack of awareness of scissoring gait, requires  verbal and visual cuing to appreciate.      Exercises      General Comments        Pertinent Vitals/Pain Pain  Assessment: No/denies pain Pain Intervention(s): Monitored during session    Home Living                      Prior Function            PT Goals (current goals can now be found in the care plan section) Acute Rehab PT Goals Patient Stated Goal: to go home before her granddaughter leaves for [place] PT Goal Formulation: With patient/family Time For Goal Achievement: 10/17/20 Potential to Achieve Goals: Good Progress towards PT goals: Progressing toward goals    Frequency    Min 4X/week      PT Plan Current plan remains appropriate    Co-evaluation              AM-PAC PT "6 Clicks" Mobility   Outcome Measure  Help needed turning from your back to your side while in a flat bed without using bedrails?: A Little Help needed moving from lying on your back to sitting on the side of a flat bed without using bedrails?: A Little Help needed moving to and from a bed to a chair (including a wheelchair)?: A Little Help needed standing up from a chair using your arms (Yates.g., wheelchair or bedside chair)?: A Little Help needed to walk in hospital room?: A Lot Help needed climbing 3-5 steps with a railing? : A Lot 6 Click Score: 16    End of Session Equipment Utilized During Treatment: Gait belt Activity Tolerance: Patient tolerated treatment well;Patient limited by fatigue Patient left: with call bell/phone within reach;with chair alarm set;in chair;with family/visitor present (RN and NT bathing and changing pt, they will set bed alarm) Nurse Communication: Mobility status PT Visit Diagnosis: Muscle weakness (generalized) (M62.81);Difficulty in walking, not elsewhere classified (R26.2);Hemiplegia and hemiparesis Hemiplegia - Right/Left: Left Hemiplegia - dominant/non-dominant: Non-dominant Hemiplegia - caused by: Cerebral infarction     Time: 3762-8315 PT Time Calculation (min) (ACUTE ONLY): 23 min  Charges:  $Gait Training: 8-22 mins $Therapeutic Activity: 8-22  mins                     Erin Yates, PT Acute Rehabilitation Services Pager [phone]  Office [phone]    Milynn Quirion D Kateri Balch 10/06/2020, 1:08 PM

## 2020-10-06 NOTE — Consult Note (Addendum)
Cardiology Consultation:   Patient ID: Erin Yates MRN: [id number]; DOB: 1946-03-08  Admit date: 10/02/2020 Date of Consult: 10/06/2020  Primary Care Provider: Adin Hector, MD [facility] Cardiologist: Virl Axe, MD  [facility] Electrophysiologist:  Virl Axe, MD    Patient Profile:   Erin Yates is a 74 y.o. 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 Irish Lack.  History of Present Illness:   [name] last saw Dr. [name] 09/07/2020, his note describes h/o bradycardia requiring down titration of her metoprolol. She was fatigued, suspect 2/2 bradycardia, reported HR 60's-190's that she felt was AFib.  Her metoprolol stopped with wht was suspect symptomatic bradycardia/fatigue  She was admitted to [facility] 10/02/2020 w/L sided weakness, fall, confusion, found with acute stroke, she did not receive IV tPA due to Eliquis. She underwent mechanical thrombectomy with IR with small volume SAH. Eliquis was held. While here developed rapid AFib was started on amiodarone gtt/IV lopressor, cardiology consulted.  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 However, pt does have multiple stroke risk factors  EP is asked to weigh in given hx of bradycardia and prior discussion by Dr. [name] of possible need for pacing She has subsequently returned to SR, rates 48-50's some faster, PACs  LABS K+ 3.1 BUN/Creat 13/1.33 WBC 8.4 H/H 10/30 Plts 228  meds currently include  amiodarone gtt Metoprolol 12.5 Q6 BP Amlodipine 10mg  QD  Eliquis on hold  The patient is AAO this AM, her husband at bedside, agrees, she is much better today and nearly back to herself. 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   PFO (patent foramen ovale)    correction device placed 2010   Right leg numbness    lower leg- residual from CVA   Seasonal allergies    Stroke [facility])    prior to PFO correction    Past Surgical History:  Procedure Laterality Date   ABDOMINAL HYSTERECTOMY     BUBBLE STUDY  02/06/2020   Procedure: BUBBLE STUDY;  Surgeon: Sanda Klein, MD;  Location: [place];  Service: Cardiovascular;;   CARDIAC CATHETERIZATION     [place]   CATARACT EXTRACTION W/PHACO Right 09/01/2015   Procedure: CATARACT EXTRACTION PHACO AND INTRAOCULAR LENS PLACEMENT ([place]);  Surgeon: Leandrew Koyanagi, MD;  Location: [place];  Service: Ophthalmology;  Laterality: Right;  DIABETIC - oral meds   CATARACT EXTRACTION W/PHACO Left 10/20/2015   Procedure: CATARACT EXTRACTION PHACO AND INTRAOCULAR LENS PLACEMENT ([facility]);  Surgeon: Leandrew Koyanagi, MD;  Location: [place];  Service: Ophthalmology;  Laterality: Left;  DIABETIC - oral meds   EP IMPLANTABLE DEVICE N/A 04/04/2016   Procedure: Loop Recorder  Insertion;  Surgeon: Deboraha Sprang, MD;  Location: [place] CV LAB;  Service: Cardiovascular;  Laterality: N/A;   FOOT NEUROMA SURGERY Left    IR CT HEAD LTD  10/02/2020   IR PERCUTANEOUS ART THROMBECTOMY/INFUSION INTRACRANIAL INC DIAG ANGIO  10/02/2020   IR US GUIDE VASC ACCESS RIGHT  10/02/2020   NASAL SINUS SURGERY     PATENT FORAMEN OVALE CLOSURE     PATENT FORAMEN OVALE CLOSURE     RADIOLOGY WITH ANESTHESIA N/A  10/02/2020   Procedure: IR WITH ANESTHESIA;  Surgeon: Luanne Bras, MD;  Location: [place];  Service: Radiology;  Laterality: N/A;   TEE WITHOUT CARDIOVERSION N/A 02/09/2016   Procedure: TRANSESOPHAGEAL ECHOCARDIOGRAM (TEE);  Surgeon: Adrian Prows, MD;  Location: [place];  Service: Cardiovascular;  Laterality: N/A;   TEE WITHOUT CARDIOVERSION N/A 11/12/2019   Procedure: TRANSESOPHAGEAL ECHOCARDIOGRAM (TEE);  Surgeon: Minna Merritts, MD;  Location: [facility];  Service: Cardiovascular;  Laterality: N/A;   TEE WITHOUT CARDIOVERSION N/A 02/06/2020   Procedure: TRANSESOPHAGEAL ECHOCARDIOGRAM (TEE);  Surgeon: Sanda Klein, MD;  Location: [facility] ENDOSCOPY;  Service: Cardiovascular;  Laterality: N/A;     Home Medications:  Prior to Admission medications   Medication Sig Start Date End Date Taking? Authorizing Provider  ALPRAZolam (XANAX) 0.25 MG tablet Take 0.25 mg by mouth 2 (two) times daily as needed for anxiety.   Yes [provider]  amLODipine (NORVASC) 10 MG tablet Take 10 mg by mouth every evening.    Yes [provider]  atorvastatin (LIPITOR) 80 MG tablet Take 80 mg by mouth daily.   Yes [provider]  buPROPion (WELLBUTRIN SR) 150 MG 12 hr tablet Take 1 tablet (150 mg total) by mouth 2 (two) times daily. 03/27/16  Yes Garvin Fila, MD  cholecalciferol (VITAMIN D3) 25 MCG (1000 UT) tablet Take 1,000 Units by mouth daily.   Yes [provider]  desvenlafaxine (PRISTIQ) 100 MG 24 hr tablet Take 100 mg by mouth daily. 12/05/19  Yes [provider]  ELIQUIS 5 MG TABS tablet TAKE 1 TABLET (5 MG TOTAL) BY MOUTH 2 (TWO) TIMES DAILY. Patient taking differently: Take 5 mg by mouth 2 (two) times daily.  02/23/20  Yes Deboraha Sprang, MD  ferrous sulfate 325 (65 FE) MG tablet Take 325 mg by mouth every evening.   Yes [provider]  fluticasone (FLONASE) 50 MCG/ACT nasal spray Place into both nostrils daily as needed for allergies or rhinitis.    Yes [provider]  levothyroxine (SYNTHROID, LEVOTHROID) 50 MCG tablet Take 50 mcg by mouth daily before breakfast.   Yes [provider]  losartan (COZAAR) 100 MG tablet Take 100 mg by mouth daily.    Yes [provider]  metFORMIN (GLUCOPHAGE) 1000 MG tablet Take 1,000 mg by mouth daily with breakfast.    Yes [provider]  pantoprazole (PROTONIX) 40 MG tablet Take 40 mg by mouth 2 (two) times daily. Reported on 04/17/2016   Yes [provider]  traZODone (DESYREL) 50 MG tablet Take 50 mg by mouth at bedtime as needed for sleep.    Yes [provider]  vitamin B-12 (CYANOCOBALAMIN) 500 MCG tablet Take 500 mcg by mouth every evening.    Yes [provider]    Inpatient Medications: Scheduled Meds:   stroke: mapping our early stages of recovery book   Does not apply Once   amLODipine  10 mg Oral QPM   atorvastatin  80 mg Oral Daily  buPROPion  150 mg Oral BID   Chlorhexidine Gluconate Cloth  6 each Topical Q0600   hydrALAZINE  10 mg Intravenous Once   insulin aspart  0-6 Units Subcutaneous Q4H   levothyroxine  50 mcg Oral QAC breakfast   mouth rinse  15 mL Mouth Rinse BID   metoprolol tartrate  12.5 mg Oral Q6H   mupirocin ointment  1 application Nasal BID   nicotine  14 mg Transdermal Daily   pantoprazole  40 mg Oral Daily   potassium chloride  40 mEq Oral Q4H   QUEtiapine  25 mg Oral BID   venlafaxine XR  150 mg Oral Q breakfast   vitamin B-12  500 mcg Oral QPM   Continuous Infusions:  sodium chloride 40 mL/hr at 10/06/20 0800   amiodarone 30 mg/hr (10/06/20 0800)   magnesium sulfate bolus IVPB     PRN Meds: acetaminophen **OR** acetaminophen (TYLENOL) oral liquid 160 mg/5 mL **OR** acetaminophen, ALPRAZolam, haloperidol lactate  Allergies:    Allergies  Allergen Reactions   Alendronate Other (See Comments)    Rash    Penicillins Rash    Also swelling and itching  Did it involve  swelling of the face/tongue/throat, SOB, or low BP? Yes Did it involve sudden or severe rash/hives, skin peeling, or any reaction on the inside of your mouth or nose? Yes Did you need to seek medical attention at a hospital or doctor's office? Yes When did it last happen? More than 15 years ago If all above answers are NO, may proceed with cephalosporin use.     Social History:   Social History   Socioeconomic History   Marital status: Married    Spouse name: Not on file   Number of children: Not on file   Years of education: Not on file   Highest education level: Not on file  Occupational History   Not on file  Tobacco Use   Smoking status: Current Every Day Smoker    Packs/day: 1.00    Years: 55.00    Pack years: 55.00   Smokeless tobacco: Never Used  Scientific laboratory technician Use: Never used  Substance and Sexual Activity   Alcohol use: No   Drug use: No   Sexual activity: Not on file  Other Topics Concern   Not on file  Social History Narrative   Not on file   Social Determinants of Health   Financial Resource Strain:    Difficulty of Paying Living Expenses: Not on file  Food Insecurity:    Worried About [place] in the Last Year: Not on file   Ran Out of Food in the Last Year: Not on file  Transportation Needs:    Lack of Transportation (Medical): Not on file   Lack of Transportation (Non-Medical): Not on file  Physical Activity:    Days of Exercise per Week: Not on file   Minutes of Exercise per Session: Not on file  Stress:    Feeling of Stress : Not on file  Social Connections:    Frequency of Communication with Friends and Family: Not on file   Frequency of Social Gatherings with Friends and Family: Not on file   Attends Religious Services: Not on file   Active Member of Clubs or Organizations: Not on file   Attends Archivist Meetings: Not on file   Marital Status: Not on file  Intimate Partner Violence:     Fear of Current or Ex-Partner: Not  on file   Emotionally Abused: Not on file   Physically Abused: Not on file   Sexually Abused: Not on file    Family History:   Family History  Problem Relation Age of Onset   Dementia Father    Breast cancer Sister 33     ROS:  Please see the history of present illness.  All other ROS reviewed and negative.     Physical Exam/Data:   Vitals:   10/06/20 0700 10/06/20 0800 10/06/20 0819 10/06/20 0900  BP: (!) 174/55 (!) 161/56  (!) (P) 176/149  Pulse: (!) 53 (!) 58  (!) 56  Resp: 18 16  19   Temp:   99.4 F (37.4 C)   TempSrc:   Oral   SpO2: 95% 94%  95%  Weight:      Height:        Intake/Output Summary (Last 24 hours) at 10/06/2020 0959 Last data filed at 10/06/2020 0947 Gross per 24 hour  Intake 1848.26 ml  Output 1800 ml  Net 48.26 ml   Last 3 Weights 10/02/2020 09/07/2020 03/16/2020  Weight (lbs) 134 lb 11.2 oz 131 lb 135 lb 4 oz  Weight (kg) 61.1 kg 59.421 kg 61.349 kg     Body mass index is 22.42 kg/m.  General:  Well nourished, well developed, in no acute distress HEENT: normal Lymph: no adenopathy Neck: no JVD Endocrine:  No thryomegaly Vascular: No carotid bruits Cardiac: RRR; soft SM, no gallops or rubs Lungs:  CTA b/l, no wheezing, rhonchi or rales  Abd: soft, nontender Ext: no edema Musculoskeletal:  No deformities, age appropriate/perhaps advanced atrophy Skin: warm and dry  Neuro:  Deferred to neurology team Psych:  Normal affect   EKG:  The EKG was personally reviewed and demonstrates:   #1 artifact, regular, 79bpm #2 SR 74, LAD #3 coarse AFib/flutter, 92bpm  Telemetry:  Telemetry was personally reviewed and demonstrates:   Has converted to SB/SR 48-50's-60's, occ-freq PACs, no post conversion pause Yesterday with Afib 90's-120's  Relevant CV Studies:  Echo 10/04/20  1. Left ventricular ejection fraction, by estimation, is 60 to 65%. The  left ventricle has normal function. The left ventricle  has no regional  wall motion abnormalities. There is moderate asymmetric left ventricular  hypertrophy of the basal-septal  segment. Left ventricular diastolic parameters are indeterminate.   2. Right ventricular systolic function is normal. The right ventricular  size is normal. Tricuspid regurgitation signal is inadequate for assessing  PA pressure.   3. The mitral valve is degenerative. Mild mitral valve regurgitation.  Mild mitral stenosis. 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   4. The aortic valve is tricuspid. Aortic valve regurgitation is not  visualized. No aortic stenosis is present.   5. The inferior vena cava is normal in size with greater than 50%  respiratory variability, suggesting right atrial pressure of 3 mmHg.    Long term monitor 09/07/20 Indication: afib   Duration: 14d Findings HR  avg 70  Min 47-Max 123  PVCs Rare, less than 1%  PACs 1.4% RECURRENT SVT --SHORT atrial tachycardia Afib >>3 hrs  -- mean HR 120's     Recommendations Continue anticoagulation May need strategy for rate control, either Afib therapy or backup brady pacing with more AV nodal blockade  Laboratory Data:  High Sensitivity Troponin:   Recent Labs  Lab 10/04/20 0204 10/04/20 1208  TROPONINIHS  [place] Recent Labs  Lab 10/04/20 0118 10/05/20 0021 10/06/20 0337  NA 139 140 139  K 3.7 3.9 3.1*  CL 109 108 108  CO2 17* 17* 18*  GLUCOSE 99 140* 115*  BUN 11 11 13   CREATININE 1.28* 1.32* 1.33*  CALCIUM 8.8* 8.9 8.4*  GFRNONAA 44* 42* 42*  ANIONGAP 13 15 13     Recent Labs  Lab 10/02/20 1903  PROT 6.9  ALBUMIN 3.6  AST 18  ALT 15  ALKPHOS 70  BILITOT 0.3   Hematology Recent Labs  Lab 10/04/20 0118 10/05/20 0021 10/06/20 0337  WBC 11.5* 13.2* 8.4  RBC 3.22* 3.47* 3.23*  HGB 10.3* 11.1* 10.4*  HCT 30.5* 32.6* 30.5*  MCV  94.7 93.9 94.4  MCH 32.0 32.0 32.2  MCHC 33.8 34.0 34.1  RDW 12.9 13.0 12.8  PLT 232 255 228   BNPNo results for input(s): BNP, PROBNP in the last 168 hours.  DDimer No results for input(s): DDIMER in the last 168 hours.   Radiology/Studies:   CT Code Stroke CTA Head W/WO contrast Result Date: 10/02/2020 CLINICAL DATA:  Left-sided weakness. EXAM: CT ANGIOGRAPHY HEAD AND NECK TECHNIQUE: Multidetector CT imaging of the head and neck was performed using the standard protocol during bolus administration of intravenous contrast. Multiplanar CT image reconstructions and MIPs were obtained to evaluate the vascular anatomy. Carotid stenosis measurements (when applicable) are obtained utilizing NASCET criteria, using the distal internal carotid diameter as the denominator. CONTRAST:  130mL OMNIPAQUE IOHEXOL 350 MG/ML SOLN COMPARISON:  Head and neck MRA 11/11/2019 FINDINGS: CTA NECK FINDINGS Aortic arch: Normal variant aortic arch branching pattern with common origin of the brachiocephalic and left common carotid arteries. Mild arch atherosclerosis without arch vessel origin stenosis. Right carotid system: Patent with minimal calcified plaque in the proximal ICA. No evidence of stenosis or dissection. Tortuous proximal common carotid artery. Left carotid system: Patent with minimal soft and calcified plaque in the proximal ICA. No evidence of stenosis or dissection. Tortuous proximal common carotid artery. Vertebral arteries: Patent with mild scattered plaque bilaterally. No evidence of stenosis or dissection. Slightly dominant left vertebral artery. Skeleton: Focally advanced disc degeneration at C4-5. Other neck: No evidence of cervical lymphadenopathy or mass. Upper chest: Mild centrilobular emphysema. Review of the MIP images confirms the above findings CTA HEAD FINDINGS Anterior circulation: The internal carotid arteries are patent from skull base to carotid termini with mild calcified plaque bilaterally  not resulting in a significant stenosis. The right M1 segment is widely patent, however there is occlusion of the right M2 inferior division less than 1 cm beyond the MCA bifurcation. The left MCA and both ACAs are patent. There is an unchanged severe proximal left A1 stenosis. No aneurysm is identified. Posterior circulation: The intracranial vertebral arteries are widely patent to the basilar. Patent right PICA, left AICA, and bilateral SCA origins are identified. The basilar artery is widely patent. Posterior communicating arteries are diminutive or absent. The PCAs are patent without evidence of a significant proximal stenosis on the left. There is an unchanged moderate proximal right P2 stenosis. No aneurysm is identified. Venous sinuses: Not well evaluated due to arterial contrast timing. Anatomic variants: None. Review of the MIP images confirms the above findings IMPRESSION: 1. Emergent proximal right M2 occlusion. 2. Mild carotid and vertebral atherosclerosis without significant stenosis. 3.  Aortic Atherosclerosis (ICD10-I70.0). These results were communicated to Dr. Leonel Ramsay at 7:23 pm on 10/02/2020 by text page via the [facility] messaging  system. Electronically Signed   By: Logan Bores M.D.   On: 10/02/2020 19:44  CT HEAD WO CONTRAST Addendum Date: 10/04/2020   ADDENDUM REPORT: 10/04/2020 14:56 ADDENDUM: Findings discussed Dr. Erlinda Hong at 2:56 PM via telephone. Electronically Signed   By: Margaretha Sheffield MD   On: 10/04/2020 14:56  Result Date: 10/04/2020 CLINICAL DATA:  Stroke follow-up. EXAM: CT HEAD WITHOUT CONTRAST TECHNIQUE: Contiguous axial images were obtained from the base of the skull through the vertex without intravenous contrast. COMPARISON:  October 03, 2020. FINDINGS: Severely limited study given patient motion. Findings could easily be obscured. There is evidence of a right MCA territory infarct with progressive edema involving the right insula, posterior right frontal lobe, parietal  lobe, and superior temporal lobe. There is suggestion of an area of hyperdensity in the region of the right sylvian fissure, concerning for acute hemorrhage (possibly subarachnoid). No evidence of substantial midline shift. No evidence of hydrocephalus. IMPRESSION: Severely limited study with findings consistent with an evolving posterior right MCA territory infarct with progressive edema. There is hyperdensity in the region of the right sylvian fissure, concerning for acute subarachnoid hemorrhage but suboptimally evaluated. There is sulcal effacement without substantial midline shift. Electronically Signed: By: Margaretha Sheffield MD On: 10/04/2020 14:50     IR [place] Result Date: 10/04/2020 INDICATION: 74 year old female with a history of acute stroke with left-sided symptoms, presents for attempt at thrombectomy EXAM: ULTRASOUND-GUIDED ACCESS RIGHT COMMON FEMORAL ARTERY CERVICAL AND CEREBRAL ANGIOGRAM MECHANICAL THROMBECTOMY RIGHT MCA TERRITORY ANGIO-SEAL FOR HEMOSTASIS COMPARISON:  CT imaging of the same day MEDICATIONS: None ANESTHESIA/SEDATION: The anesthesia team was present to provide general endotracheal tube anesthesia and for patient monitoring during the procedure. Intubation was performed in negative pressure [place] in neuro IR holding. Left radial arterial line was performed by the anesthesia team. Interventional neuro radiology nursing staff was also present. CONTRAST:  110 cc FLUOROSCOPY TIME:  Fluoroscopy Time: 22 minutes 36 seconds (841 mGy). COMPLICATIONS: SIR LEVEL B - Normal therapy, includes overnight admission for observation., Subarachnoid hemorrhage TECHNIQUE: Informed written consent was obtained from the patient's family after a thorough discussion of the procedural risks, benefits and alternatives. Specific risks discussed include: Bleeding, infection, contrast reaction, kidney injury/failure, need for further procedure/surgery, arterial injury or dissection, embolization to new  territory, intracranial hemorrhage (10-15% risk), neurologic deterioration, cardiopulmonary collapse, death. All questions were addressed. Maximal Sterile Barrier Technique was utilized including during the procedure including caps, mask, sterile gowns, sterile gloves, sterile drape, hand hygiene and skin antiseptic. A timeout was performed prior to the initiation of the procedure. The anesthesia team was present to provide general endotracheal tube anesthesia and for patient monitoring during the procedure. Interventional neuro radiology nursing staff was also present. FINDINGS: Initial Findings: Right common carotid artery:  Normal course caliber and contour. Right external carotid artery: Patent with antegrade flow. Right internal carotid artery: Normal course caliber and contour of the cervical portion. Vertical and petrous segment patent with normal course caliber contour. Cavernous segment patent. Clinoid segment patent. Antegrade flow of the ophthalmic artery. Ophthalmic segment patent. Terminus patent. Right MCA: M1 segment patent. The M1 bifurcates to superior and inferior divisions at the insula, occlusion of the inferior division just after the branch point. Inferior division is the target: TICI 0: No perfusion or antegrade flow beyond site of occlusion Right ACA: A 1 segment patent. The A2 segment perfuses both the right and left ACA territory, with apparent azygos configuration. Completion Findings: Right MCA: M1 patent. No embolism  new territory. Restoration of flow through the artery of interest, inferior division TICI 3: Complete perfusion of the territory PROCEDURE: The anesthesia team was present to provide general endotracheal tube anesthesia and for patient monitoring during the procedure. Intubation was performed in negative pressure [place] in neuro IR holding. Interventional neuro radiology nursing staff was also present. Ultrasound survey of the right inguinal region was performed with images  stored and sent to PACs. 11 blade scalpel was used to make a small incision. Blunt dissection was performed with US guidance. A micropuncture needle was used access the right common femoral artery under ultrasound. With excellent arterial blood flow returned, an .018 micro wire was passed through the needle, observed to enter the abdominal aorta under fluoroscopy. The needle was removed, and a micropuncture sheath was placed over the wire. The inner dilator and wire were removed, and an 035 wire was advanced under fluoroscopy into the abdominal aorta. The sheath was removed and a 25cm 7F straight vascular sheath was placed. The dilator was removed and the sheath was flushed. Sheath was attached to pressurized and heparinized saline bag for constant forward flow. A coaxial system was then advanced over the 035 wire. This included a 95cm 087 "Walrus" balloon guide with coaxial 125cm Berenstein diagnostic catheter. This was advanced to the proximal descending thoracic aorta. Wire was then removed. Double flush of the catheter was performed. Catheter was then used to select the innominate artery. Angiogram was performed. Using roadmap technique, the catheter was advanced over a standard glide wire into right cervical ICA, with distal position achieved of the balloon guide. The diagnostic catheter and the wire were removed. Formal angiogram was performed. Road map function was used once the occluded vessel was identified. Copious back flush was performed and the balloon catheter was attached to heparinized and pressurized saline bag for forward flow. A second coaxial system was then advanced through the balloon catheter, which included the selected intermediate catheter, microcatheter, and microwire. In this scenario, the set up included a 132cm CAT-6 intermediate catheter, a Trevo Provue18 microcatheter, and 014 synchro standard wire. This system was advanced through the balloon guide catheter under the road-map  function, with adequate back-flush at the rotating hemostatic valve at that back end of the balloon guide. . Microcatheter and the intermediate catheter system were advanced through the terminal ICA and MCA to the level of the occlusion. The micro wire was then carefully advanced through the occluded segment. Microcatheter was then manipulated through the occluded segment and the wire was removed with saline drip at the hub. Blood was then aspirated through the hub of the microcatheter, and a gentle contrast injection was performed confirming intraluminal position. A rotating hemostatic valve was then attached to the back end of the microcatheter, and a pressurized and heparinized saline bag was attached to the catheter. 37mm Trevo device was then selected. Back flush was achieved at the rotating hemostatic valve, and then the device was gently advanced through the microcatheter to the distal end. The retriever was then unsheathed by withdrawing the microcatheter under fluoroscopy. Once the retriever was completely unsheathed, the microcatheter was carefully stripped from the delivery device. Control angiogram was performed from the intermediate catheter. Occlusion persisted at the inferior division. We elected to attempt a second pass. On the second pass, slight alteration was performed with the second coaxial system. In this next scenario, the coaxial set up included a 132cm CAT-5 intermediate catheter, a Trevo Provue18 microcatheter, and 014 synchro standard wire. This system was  advanced through the balloon guide catheter under the road-map function, with adequate back-flush at the rotating hemostatic valve at that back end of the balloon guide. Microcatheter and the intermediate catheter system were advanced through the terminal ICA and MCA to the level of the occlusion. The micro wire was then carefully advanced through the occluded segment. Microcatheter was then manipulated through the occluded segment and  the wire was removed with saline drip at the hub. On this attempt, the microcatheter and microwire were recognized to enter a more cephalad branch, the angular artery rather than the position on the first attempt, which was a more caudal branch. Blood was then aspirated through the hub of the microcatheter, and a gentle contrast injection was performed confirming intraluminal position. A rotating hemostatic valve was then attached to the back end of the microcatheter, and a pressurized and heparinized saline bag was attached to the catheter. 1mm Trevo device was then again used. Back flush was achieved at the rotating hemostatic valve, and then the device was gently advanced through the microcatheter to the distal end. The retriever was then unsheathed by withdrawing the microcatheter under fluoroscopy. Once the retriever was completely unsheathed, the microcatheter was carefully stripped from the delivery device. Control angiogram was performed from the intermediate catheter. A 3 minute time interval was observed. The balloon at the balloon guide catheter was then inflated under fluoroscopy for proximal flow arrest. Constant aspiration using the proprietary engine was then performed at the intermediate catheter, as the retriever was gently and slowly withdrawn with fluoroscopic observation. Once the retriever was "corked" within the tip of the intermediate catheter, both were removed from the system. Free aspiration was confirmed at the hub of the balloon guide catheter, with free blood return confirmed. The balloon was then deflated, and a control angiogram was performed. Occlusion persisted within the inferior division, with TICI 0 flow. We elected to attempt a third pass. In this third pass scenario we again altered the coaxial system. The set up included a 137cm zoom 71 intermediate catheter, a Trevo Provue18 microcatheter, and 014 synchro standard wire. This system was advanced through the balloon guide  catheter under the road-map function, with adequate back-flush at the rotating hemostatic valve at that back end of the balloon guide. Microcatheter and the intermediate catheter system were advanced through the terminal ICA and MCA to the level of the occlusion. The micro wire was then carefully advanced through the occluded segment. Microcatheter was then manipulated through the occluded segment and the wire was removed with saline drip at the hub. The wire again entered the angular branch on this occasion. Blood was then aspirated through the hub of the microcatheter, and a gentle contrast injection was performed confirming intraluminal position. A rotating hemostatic valve was then attached to the back end of the microcatheter, and a pressurized and heparinized saline bag was attached to the catheter. On this attempt, a 4 mm solitaire device was selected. Back flush was achieved at the rotating hemostatic valve, and then the device was gently advanced through the microcatheter to the distal end. The retriever was then unsheathed by withdrawing the microcatheter under fluoroscopy. Once the retriever was completely unsheathed, the microcatheter was carefully stripped from the delivery device. Control angiogram was performed from the intermediate catheter. A 3 minute time interval was observed. The balloon at the balloon guide catheter was then inflated under fluoroscopy for proximal flow arrest. Constant aspiration using the proprietary engine was then performed at the intermediate catheter, as the  retriever was gently and slowly withdrawn with fluoroscopic observation. Once the retriever was "corked" within the tip of the intermediate catheter, both were removed from the system. Free aspiration was confirmed at the hub of the balloon guide catheter, with free blood return confirmed. The balloon was then deflated, and a control angiogram was performed. Restoration of flow was confirmed. Angiogram of the cervical  ICA was performed. Balloon guide was then removed. The skin at the puncture site was then cleaned with Chlorhexidine. The 8 French sheath was removed and an 33F angioseal was deployed. Flat panel CT was performed. Patient was extubated once the CT was reviewed with neurology team. Patient tolerated the procedure well and remained hemodynamically stable throughout. Blood loss estimated 100 cc IMPRESSION: Status post ultrasound guided access right common femoral artery for cervical/cerebral angiogram and mechanical thrombectomy of right MCA M2 inferior branch division, achieving TICI 3 flow after 3 passes of combination therapy using stent retriever and local aspiration on each attempt. Small volume subarachnoid hemorrhage in the sylvian fissure at the completion of the exam. Angio-Seal for hemostasis. Signed, Dulcy Fanny. Dellia Nims, [facility] Radiology PLAN: Patient was extubated. ICU status Target systolic blood pressure of 120-140 Right hip straight time 6 hours Frequent neurovascular checks Repeat neurologic imaging with CT and/MRI at the discretion of neurology team Electronically Signed   By: Corrie Mckusick D.O.   On: 10/04/2020 08:18     Assessment and Plan:   1. Paroxysmal AFib     CHA2DS2Vasc is 7, on Eliquis out patient     Admitted with recurrent stroke 2. Known SB  Monitor started Nov 2/21 (14 days) (her metoprolol succ 25mg  daily stopped 09/07/20) HR  avg 70  Min 47-Max 123 Afib >>3 hrs  -- mean HR 120's    She had a loop, last transmission Feb 4268 Uncertain if brady was programmed on, no brady events noted Was RRT then  Discussed with the patient and husband at bedside. She denies hx of syncope She had a fall with her stroke >> AMS The patient very much would like to avoid PPM, and "certainly not now" I don't think we are in a position of urgency for this either She is tolerating the amiodarone and BB currently with rates largely  60's  Dr. Curt Bears Benen Weida see the patient later today She has an appt to see Dr. [name] 10/28/20, may be that we have her keep that follow up unless we see significant bradycardia here.   Maame Dack defer a/c recommendations and resumption to  The neurology team   For questions or updates, please contact [place] Please consult [email or web address] for contact info under    Signed, Baldwin [place], PA-C  10/06/2020 9:59 AM  I have seen and examined this patient with Tommye Standard.  Agree with above, note added to reflect my findings.  On exam, RRR, no murmurs, lungs clear.  Patient admitted to the hospital with cryptogenic stroke.  She does have a history of atrial fibrillation and is on Eliquis.  She went into rapid atrial fibrillation with heart rates in the 120s.  She was started on IV amiodarone which converted her to sinus rhythm.  She remains in sinus rhythm.  We Parthenia Tellefsen switch her to p.o. amiodarone tonight.  She does have a history of fatigue and mild bradycardia on beta-blockers.  We [place] Soberanis hold off on metoprolol.  Ramla Hase M. Kamerin Axford MD 10/06/2020 11:23 AM

## 2020-10-06 NOTE — Consult Note (Signed)
Physical Medicine and Rehabilitation Consult Reason for Consult: Left side weakness Referring Physician: Dr.Xu   HPI: Erin Yates is a 74 y.o. right-handed female with history of diabetes mellitus, hypertension, atrial fibrillation maintained on Eliquis as well as previous PFO closure 05/2009, tobacco abuse.  History taken from chart review and nursing due to extreme somnolence.  Patient lives with spouse.  Two-level home bed and bath main level 3 steps to entry.  Independent ambulation distances without assistive device.  She presented on 10/02/2020 with left hemiparesis.  CT of the head unremarkable for acute intracranial process.  Multiple chronic infarcts.  CT angiogram of head and neck emergent proximal right M2 occlusion.  Patient underwent mechanical thrombectomy per interventional radiology.  Follow-up CT personally reviewed, limited study.  Per report, severely limited study findings consistent with evolving posterior right MCA territory infarct with progressive edema.  There was hyperdensity in the region of the right sylvian fissure, concerning for acute SAH but suboptimally evaluated.  Echo cardiogram with ejection fraction of 60-65%, no wall motion abnormalities.  Neurology follow-up Eliquis currently on hold due to small volume SAH and maintain on low-dose aspirin.  Cardiology service consulted 10/05/2020 for atrial fibrillation with RVR and did receive IV metoprolol and IV amiodarone.  Currently maintained on dysphagia #2 nectar thick liquid diet.  Therapy evaluations completed with recommendations of physical medicine rehab consult.  Review of Systems  Unable to perform ROS: Mental acuity   Past Medical History:  Diagnosis Date  . Anemia    past  hx  . Anxiety   . Depression   . Diabetes mellitus without complication ([place])   . Dysrhythmia    prior to PFO correction  . GERD (gastroesophageal reflux disease)   . Headache    before PFO closure  . Hypercholesteremia   .  Hypertension   . Hypothyroidism   . 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 PFO (patent foramen ovale)    correction device placed 2010  . Right leg numbness    lower leg- residual from CVA  . Seasonal allergies   . Stroke [facility])    prior to PFO correction   Past Surgical History:  Procedure Laterality Date  . ABDOMINAL HYSTERECTOMY    . BUBBLE STUDY  02/06/2020   Procedure: BUBBLE STUDY;  Surgeon: Sanda Klein, MD;  Location: [place] ENDOSCOPY;  Service: Cardiovascular;;  . [place]  . CATARACT EXTRACTION W/PHACO Right 09/01/2015   Procedure: CATARACT EXTRACTION PHACO AND INTRAOCULAR LENS PLACEMENT (IOC);  Surgeon: Leandrew Koyanagi, MD;  Location: [place];  Service: Ophthalmology;  Laterality: Right;  DIABETIC - oral meds  . CATARACT EXTRACTION W/PHACO Left 10/20/2015   Procedure: CATARACT EXTRACTION PHACO AND INTRAOCULAR LENS PLACEMENT (IOC);  Surgeon: Leandrew Koyanagi, MD;  Location: [place];  Service: Ophthalmology;  Laterality: Left;  DIABETIC - oral meds  . EP IMPLANTABLE DEVICE N/A 04/04/2016   Procedure: Loop Recorder Insertion;  Surgeon: Deboraha Sprang, MD;  Location: [place] CV LAB;  Service: Cardiovascular;  Laterality: N/A;  . FOOT NEUROMA SURGERY Left   . IR CT HEAD LTD  10/02/2020  . IR PERCUTANEOUS ART THROMBECTOMY/INFUSION INTRACRANIAL INC DIAG ANGIO  10/02/2020  . IR US GUIDE VASC ACCESS RIGHT  10/02/2020  . NASAL SINUS SURGERY    . PATENT FORAMEN OVALE CLOSURE    . PATENT FORAMEN OVALE CLOSURE    . RADIOLOGY WITH ANESTHESIA N/A 10/02/2020  Procedure: IR WITH ANESTHESIA;  Surgeon: Luanne Bras, MD;  Location: [place];  Service: Radiology;  Laterality: N/A;  . TEE WITHOUT CARDIOVERSION N/A 02/09/2016   Procedure: TRANSESOPHAGEAL ECHOCARDIOGRAM (TEE);  Surgeon: Adrian Prows, MD;  Location: [place];  Service: Cardiovascular;  Laterality: N/A;  . TEE WITHOUT CARDIOVERSION N/A 11/12/2019   Procedure:  TRANSESOPHAGEAL ECHOCARDIOGRAM (TEE);  Surgeon: Minna Merritts, MD;  Location: [facility] ORS;  Service: Cardiovascular;  Laterality: N/A;  . TEE WITHOUT CARDIOVERSION N/A 02/06/2020   Procedure: TRANSESOPHAGEAL ECHOCARDIOGRAM (TEE);  Surgeon: Sanda Klein, MD;  Location: [facility] ENDOSCOPY;  Service: Cardiovascular;  Laterality: N/A;   Family History  Problem Relation Age of Onset  . Dementia Father   . Breast cancer Sister 24   Social History:  reports that she has been smoking. She has a 55.00 pack-year smoking history. She has never used smokeless tobacco. She reports that she does not drink alcohol and does not use drugs. Allergies:  Allergies  Allergen Reactions  . Alendronate Other (See Comments)    Rash   . Penicillins Rash    Also swelling and itching  Did it involve swelling of the face/tongue/throat, SOB, or low BP? Yes Did it involve sudden or severe rash/hives, skin peeling, or any reaction on the inside of your mouth or nose? Yes Did you need to seek medical attention at a hospital or doctor's office? Yes When did it last happen?More than 15 years ago If all above answers are "NO", may proceed with cephalosporin use.    Medications Prior to Admission  Medication Sig Dispense Refill  . ALPRAZolam (XANAX) 0.25 MG tablet Take 0.25 mg by mouth 2 (two) times daily as needed for anxiety.    Marland Kitchen amLODipine (NORVASC) 10 MG tablet Take 10 mg by mouth every evening.     Marland Kitchen atorvastatin (LIPITOR) 80 MG tablet Take 80 mg by mouth daily.    Marland Kitchen buPROPion (WELLBUTRIN SR) 150 MG 12 hr tablet Take 1 tablet (150 mg total) by mouth 2 (two) times daily. 60 tablet 5  . cholecalciferol (VITAMIN D3) 25 MCG (1000 UT) tablet Take 1,000 Units by mouth daily.    Marland Kitchen desvenlafaxine (PRISTIQ) 100 MG 24 hr tablet Take 100 mg by mouth daily.    Marland Kitchen ELIQUIS 5 MG TABS tablet TAKE 1 TABLET (5 MG TOTAL) BY MOUTH 2 (TWO) TIMES DAILY. (Patient taking differently: Take 5 mg by mouth 2 (two) times daily. ) 180 tablet 1   . ferrous sulfate 325 (65 FE) MG tablet Take 325 mg by mouth every evening.    . fluticasone (FLONASE) 50 MCG/ACT nasal spray Place into both nostrils daily as needed for allergies or rhinitis.    Marland Kitchen levothyroxine (SYNTHROID, LEVOTHROID) 50 MCG tablet Take 50 mcg by mouth daily before breakfast.    . losartan (COZAAR) 100 MG tablet Take 100 mg by mouth daily.     . metFORMIN (GLUCOPHAGE) 1000 MG tablet Take 1,000 mg by mouth daily with breakfast.     . pantoprazole (PROTONIX) 40 MG tablet Take 40 mg by mouth 2 (two) times daily. Reported on 04/17/2016    . traZODone (DESYREL) 50 MG tablet Take 50 mg by mouth at bedtime as needed for sleep.     . vitamin B-12 (CYANOCOBALAMIN) 500 MCG tablet Take 500 mcg by mouth every evening.       Home: Home Living Family/patient expects to be discharged to:: Private residence Living Arrangements: Spouse/significant other Available Help at Discharge: Family, Available 24 hours/day Type of  Home: House Home Access: Stairs to enter [employer] of Steps: 3 Entrance Stairs-Rails: Right, Left, Can reach both Home Layout: Two level, Able to live on main level with bedroom/bathroom Bathroom Shower/Tub: Tub/shower unit, Architectural technologist: Standard Home Equipment: Environmental consultant - 2 wheels, Cane - single point, Shower seat, Grab bars - tub/shower, Hand held shower head Additional Comments: wears glasses for reading, not here in the hospital.  Likes coloring (may need her glasses to do this successfully).   Lives With: Spouse  Functional History: Prior Function Level of Independence: Independent Comments: Ind amb community distances without an AD, no fall history, Ind with ADLs Functional Status:  Mobility: Bed Mobility Overal bed mobility: Needs Assistance Bed Mobility: Supine to Sit Supine to sit: Supervision, HOB elevated Sit to supine: Supervision, HOB elevated General bed mobility comments: for safety, increased time Transfers Overall  transfer level: Needs assistance Equipment used: 2 person hand held assist Transfers: Sit to/from Stand Sit to Stand: Min assist, +2 safety/equipment General transfer comment: min +2 for power up, pt reaching for bilateral UE support. STS x3, from EOB x2 and from toilet. Ambulation/Gait Ambulation/Gait assistance: Min assist, +2 safety/equipment Gait Distance (Feet): 15 Feet (x2, to and from toilet) Assistive device: 2 person hand held assist, 1 person hand held assist, IV Pole Gait Pattern/deviations: Step-through pattern, Decreased stride length, Shuffle, Trunk flexed General Gait Details: Min +2 for steadying, guiding pt trajectory. Verbal cuing for taking larger steps, navigating room. Pt intermittently using IV pole to steady. HR 120s-160s in afib rhythm during mobility, RN aware. Gait velocity: decr Gait velocity interpretation: <1.31 ft/sec, indicative of household ambulator    ADL: ADL Overall ADL's : Needs assistance/impaired Grooming: Wash/dry hands, Standing, Moderate assistance Grooming Details (indicate cue type and reason): assist to locate items and turn on faucet  Upper Body Bathing: Minimal assistance, Sitting Lower Body Bathing: Moderate assistance, +2 for safety/equipment, Sit to/from stand Upper Body Dressing : Minimal assistance, Sitting Lower Body Dressing: Moderate assistance, +2 for safety/equipment, Sit to/from stand Toilet Transfer: Minimal assistance, +2 for physical assistance, +2 for safety/equipment, Grab bars, Regular Toilet (HHA) Toileting- Clothing Manipulation and Hygiene: Minimal assistance, +2 for safety/equipment, Sit to/from stand Toileting - Clothing Manipulation Details (indicate cue type and reason): assist to locate toilet paper, steadying assist in standing while pt performing pericare  Functional mobility during ADLs: Minimal assistance, +2 for safety/equipment, +2 for physical assistance (HHA)  Cognition: Cognition Overall Cognitive Status:  Impaired/Different from baseline Arousal/Alertness: Awake/alert Orientation Level: Oriented X4 Attention: Sustained Sustained Attention: Impaired Sustained Attention Impairment: Verbal basic, Functional basic Memory: Impaired Memory Impairment: Decreased recall of new information Awareness: Impaired Awareness Impairment: Intellectual impairment, Emergent impairment, Anticipatory impairment Problem Solving: Impaired Problem Solving Impairment: Functional basic, Verbal basic Behaviors: Impulsive Safety/Judgment: Impaired Cognition Arousal/Alertness: Awake/alert Behavior During Therapy: WFL for tasks assessed/performed Overall Cognitive Status: Impaired/Different from baseline Area of Impairment: Following commands Orientation Level: Disoriented to, Place, Situation Current Attention Level: Sustained Memory: Decreased short-term memory Following Commands: Follows multi-step commands inconsistently Safety/Judgement: Decreased awareness of safety, Decreased awareness of deficits Awareness: Intellectual Problem Solving: Slow processing, Difficulty sequencing, Requires verbal cues, Requires tactile cues General Comments: Pt perseverative on needing to use the bathroom upon PT arrival, even when told several times she had external catheter. Restless, requires multimodal cuing to stay on task. Pt states she is at [facility]  Blood pressure (!) 175/78, pulse (!) 51, temperature 99.4 F (37.4 C), temperature source Oral, resp. rate 14, height 5\' 5"  (1.651 m), weight 61.1  kg, SpO2 97 %. Physical Exam Vitals reviewed.  Constitutional:      Comments: Somnolent  HENT:     Head: Normocephalic and atraumatic.     Right Ear: External ear normal.     Left Ear: External ear normal.     Nose: Nose normal.  Eyes:     General:        Right eye: No discharge.        Left eye: No discharge.     Comments: Only briefly opens eyes  Cardiovascular:     Comments: Irregularly irregular Pulmonary:      Effort: Pulmonary effort is normal. No respiratory distress.     Breath sounds: No stridor.  Abdominal:     General: Abdomen is flat. Bowel sounds are normal. There is no distension.  Musculoskeletal:     Cervical back: Normal range of motion and neck supple.     Comments: No edema or tenderness in extremities  Skin:    Comments: Skin tears with dressing CDI  Neurological:     Comments: Appears to be oriented x2-limited due to audibility Extremely somnolent Motor: Limited due to ability to follow commands, however moving all extremities spontaneously. Briefly withdraws to painful stimuli  Psychiatric:     Comments: Unable to assess due to somnolence     Results for orders placed or performed during the hospital encounter of 10/02/20 (from the past 24 hour(s))  Glucose, capillary     Status: Abnormal   Collection Time: 10/05/20 12:05 PM  Result Value Ref Range   Glucose-Capillary 131 (H) 70 - 99 mg/dL  Glucose, capillary     Status: Abnormal   Collection Time: 10/05/20  4:25 PM  Result Value Ref Range   Glucose-Capillary 173 (H) 70 - 99 mg/dL  Glucose, capillary     Status: Abnormal   Collection Time: 10/05/20  8:03 PM  Result Value Ref Range   Glucose-Capillary 142 (H) 70 - 99 mg/dL  Glucose, capillary     Status: Abnormal   Collection Time: 10/05/20 11:46 PM  Result Value Ref Range   Glucose-Capillary 132 (H) 70 - 99 mg/dL  CBC     Status: Abnormal   Collection Time: 10/06/20  3:37 AM  Result Value Ref Range   WBC 8.4 4.0 - 10.5 K/uL   RBC 3.23 (L) 3.87 - 5.11 MIL/uL   Hemoglobin 10.4 (L) 12.0 - 15.0 g/dL   HCT 30.5 (L) 36 - 46 %   MCV 94.4 80.0 - 100.0 fL   MCH 32.2 26.0 - 34.0 pg   MCHC 34.1 30.0 - 36.0 g/dL   RDW 12.8 11.5 - 15.5 %   Platelets 228 150 - 400 K/uL   nRBC 0.0 0.0 - 0.2 %  Basic metabolic panel     Status: Abnormal   Collection Time: 10/06/20  3:37 AM  Result Value Ref Range   Sodium 139 135 - 145 mmol/L   Potassium 3.1 (L) 3.5 - 5.1 mmol/L    Chloride 108 98 - 111 mmol/L   CO2 18 (L) 22 - 32 mmol/L   Glucose, Bld 115 (H) 70 - 99 mg/dL   BUN 13 8 - 23 mg/dL   Creatinine, Ser 1.33 (H) 0.44 - 1.00 mg/dL   Calcium 8.4 (L) 8.9 - 10.3 mg/dL   GFR, Estimated 42 (L) >60 mL/min   Anion gap 13 5 - 15  Glucose, capillary     Status: Abnormal   Collection Time: 10/06/20  3:41 AM  Result Value Ref Range   Glucose-Capillary 110 (H) 70 - 99 mg/dL  Glucose, capillary     Status: Abnormal   Collection Time: 10/06/20  8:14 AM  Result Value Ref Range   Glucose-Capillary 122 (H) 70 - 99 mg/dL   CT HEAD WO CONTRAST  Addendum Date: 10/04/2020   ADDENDUM REPORT: 10/04/2020 14:56 ADDENDUM: Findings discussed Dr. Erlinda Hong at 2:56 PM via telephone. Electronically Signed   By: Margaretha Sheffield MD   On: 10/04/2020 14:56   Result Date: 10/04/2020 CLINICAL DATA:  Stroke follow-up. EXAM: CT HEAD WITHOUT CONTRAST TECHNIQUE: Contiguous axial images were obtained from the base of the skull through the vertex without intravenous contrast. COMPARISON:  October 03, 2020. FINDINGS: Severely limited study given patient motion. Findings could easily be obscured. There is evidence of a right MCA territory infarct with progressive edema involving the right insula, posterior right frontal lobe, parietal lobe, and superior temporal lobe. There is suggestion of an area of hyperdensity in the region of the right sylvian fissure, concerning for acute hemorrhage (possibly subarachnoid). No evidence of substantial midline shift. No evidence of hydrocephalus. IMPRESSION: Severely limited study with findings consistent with an evolving posterior right MCA territory infarct with progressive edema. There is hyperdensity in the region of the right sylvian fissure, concerning for acute subarachnoid hemorrhage but suboptimally evaluated. There is sulcal effacement without substantial midline shift. Electronically Signed: By: Margaretha Sheffield MD On: 10/04/2020 14:50     Assessment/Plan: Diagnosis: Right MCA territory infarct Stroke: Continue secondary stroke prophylaxis and Risk Factor Modification listed below:   Antiplatelet therapy:   Blood Pressure Management:  Continue current medication with prn's with permisive HTN per primary team Statin Agent:   Diabetes management:   Tobacco abuse:   ?  Mild left sided hemiparesis per nursing PT/OT for mobility, ADL training  Motor recovery: Fluoxetine Labs and images (see above) independently reviewed.  Records reviewed and summated above.  1. Does the need for close, 24 hr/day medical supervision in concert with the patient's rehab needs make it unreasonable for this patient to be served in a less intensive setting? Yes  2. Co-Morbidities requiring supervision/potential complications: DM with hyperglycemia (Monitor in accordance with exercise and adjust meds as necessary), HTN (Norvasc monitor and provide prns in accordance with increased physical exertion and pain), atrial fibrillation (resume Eliquis when appropriate), previous PFO closure 05/2009, tobacco abuse (counsel and appropriate), poststroke dysphagia (advance diet as tolerated), hypokalemia (continue to monitor and replete as necessary) 3. Due to safety, skin/wound care, disease management, medication administration, pain management and patient education, does the patient require 24 hr/day rehab nursing? Yes 4. Does the patient require coordinated care of a physician, rehab nurse, therapy disciplines of PT/OT/SLP to address physical and functional deficits in the context of the above medical diagnosis(es)? Yes Addressing deficits in the following areas: balance, endurance, locomotion, strength, transferring, bathing, dressing, toileting, cognition, speech, swallowing and psychosocial support 5. Can the patient actively participate in an intensive therapy program of at least 3 hrs of therapy per day at least 5 days per week? Potentially 6. The  potential for patient to make measurable gains while on inpatient rehab is excellent 7. Anticipated functional outcomes upon discharge from inpatient rehab are modified independent and supervision  with PT, modified independent and supervision with OT, modified independent and supervision with SLP. 8. Estimated rehab length of stay to reach the above functional goals is: 8-12 days. 9. Anticipated discharge destination: Home 10. Overall Rehab/Functional Prognosis: good  RECOMMENDATIONS:  This patient's condition is appropriate for continued rehabilitative care in the following setting: CIR when able to tolerate. Patient has agreed to participate in recommended program. Potentially Note that insurance prior authorization may be required for reimbursement for recommended care.  Comment: Rehab Admissions Coordinator to follow up.  I have personally performed a face to face diagnostic evaluation, including, but not limited to relevant history and physical exam findings, of this patient and developed relevant assessment and plan.  Additionally, I have reviewed and concur with the physician assistant's documentation above.   Delice Lesch, MD, ABPMR Lavon Paganini Angiulli, PA-C 10/06/2020

## 2020-10-06 NOTE — Progress Notes (Signed)
Rehab Admissions Coordinator Note:  Per PT and OT recommendation, this patient was screened by Raechel Ache for appropriateness for an Inpatient Acute Rehab Consult.  At this time, we are recommending Inpatient Rehab consult. AC will place order per protocol.   Raechel Ache 10/06/2020, 11:56 AM  I can be reached at [phone].

## 2020-10-06 NOTE — Progress Notes (Signed)
STROKE TEAM PROGRESS NOTE   INTERVAL HISTORY Husband and RN are at the bedside. Pt lying in bed, calm and more awake alert, less confusion and orientated now. Still has left hemianopia, and chronic right facial mild drop and right leg mild weakness.    OBJECTIVE Vitals:   10/06/20 0700 10/06/20 0800 10/06/20 0819 10/06/20 0900  BP: (!) 174/55 (!) 161/56  (!) 176/149  Pulse: (!) 53 (!) 58  (!) 56  Resp: 18 16  19   Temp:   99.4 F (37.4 C)   TempSrc:   Oral   SpO2: 95% 94%  95%  Weight:      Height:       CBC:  Recent Labs  Lab 10/02/20 1903 10/02/20 1909 10/05/20 0021 10/06/20 0337  WBC 9.6   < > 13.2* 8.4  NEUTROABS 6.0  --   --   --   HGB 11.7*   < > 11.1* 10.4*  HCT 36.6   < > 32.6* 30.5*  MCV 99.7   < > 93.9 94.4  PLT 227   < > 255 228   < > = values in this interval not displayed.   Basic Metabolic Panel:  Recent Labs  Lab 10/05/20 0021 10/05/20 0657 10/06/20 0337  NA 140  --  139  K 3.9  --  3.1*  CL 108  --  108  CO2 17*  --  18*  GLUCOSE 140*  --  115*  BUN 11  --  13  CREATININE 1.32*  --  1.33*  CALCIUM 8.9  --  8.4*  MG  --  1.0*  --   PHOS  --  3.3  --    Lipid Panel:     Component Value Date/Time   CHOL 115 10/03/2020 0105   TRIG 442 (H) 10/03/2020 0105   HDL 35 (L) 10/03/2020 0105   CHOLHDL 3.3 10/03/2020 0105   VLDL UNABLE TO CALCULATE IF TRIGLYCERIDE OVER 400 mg/dL 10/03/2020 0105   LDLCALC UNABLE TO CALCULATE IF TRIGLYCERIDE OVER 400 mg/dL 10/03/2020 0105   HgbA1c:  Lab Results  Component Value Date   HGBA1C 6.2 (H) 10/03/2020   Urine Drug Screen: No results found for: LABOPIA, COCAINSCRNUR, LABBENZ, AMPHETMU, THCU, LABBARB  Alcohol Level     Component Value Date/Time   ETH <10 10/03/2020 0105    IMAGING  CT HEAD CODE STROKE WO CONTRAST 10/02/2020 1. No acute infarct or intracranial hemorrhage identified.  2. ASPECTS is 10.  3. Hyperdense right MCA which will be evaluated on pending CTA.  4. Multiple chronic infarcts.    CT Code Stroke CTA Head W/WO contrast CT Code Stroke CTA Neck W/WO contrast 10/02/2020 1. Emergent proximal right M2 occlusion.  2. Mild carotid and vertebral atherosclerosis without significant stenosis.  3.  Aortic Atherosclerosis (ICD10-I70.0).   Cerebral Angio / IR 10/02/2020 R M2 occlusion (TICI0). 1st pass-Trevo 27mm & CAT 6 w/ aspiration. 2nd pass-Trevo 17mm & CAT 5 w/ aspiration. 3rd pass-Solitaire 33mm & Zoom 71 w/ aspiration. Final TICI3.   Post IR CT 10/02/2020 Small volume SAH at the sylvian fissure and right perisylvian sulci         CT HEAD WO CONTRAST 10/03/2020 Nondiagnostic evaluation due to patient intolerance and motion. The technologist was unable to perform a repeat exam due to safety Concern.  CT HEAD WO CONTRAST 10/04/2020  Severely limited study with findings consistent with an evolving posterior right MCA territory infarct with progressive edema. Thereis hyperdensity in the region of  the right sylvian fissure, concerning for acute subarachnoid hemorrhage but suboptimally evaluated. There is sulcal effacement without substantial midline shift.  Transthoracic Echocardiogram  10/04/2020 1. Left ventricular ejection fraction, by estimation, is 60 to 65%. The left ventricle has normal function. The left ventricle has no regional wall motion abnormalities. 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 Left ventricular diastolic parameters are indeterminate.  2. Right ventricular systolic function is normal. The right ventricular size is normal. Tricuspid regurgitation signal is inadequate for assessing PA pressure.  3. The mitral valve is degenerative. Mild mitral valve regurgitation. Mild mitral stenosis. 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  4. The aortic valve is tricuspid. Aortic valve regurgitation  is not visualized. No aortic stenosis is present.  5. The inferior vena cava is normal in size with greater than 50% respiratory variability, suggesting right atrial pressure of 3 mmHg.   ECG - Sinus rhythm with Premature supraventricular complexes; Left anterior fascicular block; Minimal voltage criteria for LVH, may be normal variant ( R in aVL )  PHYSICAL EXAM   Temp:  [97.6 F (36.4 C)-99.4 F (37.4 C)] 99.4 F (37.4 C) (12/01 0819) Pulse Rate:  [45-142] 56 (12/01 0900) Resp:  [14-29] 19 (12/01 0900) BP: (110-176)/(55-149) 176/149 (12/01 0900) SpO2:  [92 %-97 %] 95 % (12/01 0900)  General - Well nourished, well developed, not in acute distress.  Ophthalmologic - fundi not visualized due to noncooperation.  Cardiovascular - irregularly irregular heart rate and rhythm  Neuro - awake, alert, orientated to place, time, age and people. No aphasia, following simple commands. Decreased visual acuity, able to count fingers on the right visual field but left hemianopia. No gaze palsy but right gaze preference. Right mild chronic facial droop, tongue protrusion midline. Left UE 4/5 proximal with mild drift and 4-/5 distal with decreased finger grip, RUE at least 4+/5 proximal and distal. LLE 4+/5 and RLE 4/5 seems to be chronic. Sensation symmetrical subjectively, coordination intact b/l FTN and gait not tested.   ASSESSMENT/PLAN Ms. Luella Gardenhire is a 74 y.o. female with history of diabetes, hypothyroidism, ongoing tobacco use, HTN, HLD, hx of PFO closure, implantable loop, hypertension, atrial fibrillation on eliquis as well as multiple previous strokes (residual visual deficits) who presents with left-sided weakness, a fall, rightward gaze, and confusion. She did not receive IV t-PA due to anticoagulation with Eliquis. 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 However, pt does have multiple stroke risk factors.   CT Head -  No acute infarct or intracranial hemorrhage identified. ASPECTS is 10. Hyperdense right MCA. Multiple chronic infarcts.   CTA H&N - Emergent proximal right M2 occlusion. Mild carotid and vertebral atherosclerosis without significant stenosis.   IR TICI3 of right M2 occlusion, Small volume SAH at the sylvian fissure and right perisylvian sulci    CT head 11/29 an evolving posterior right MCA territory infarct with progressive edema. There is hyperdensity in the region of the right sylvian fissure  MRI and MRA head - hold off due to agitation and noncooperation  2D Echo - EF 60-65.%. degenerative MV w/ echodensity base posterior leaflet c/w severe P3 prolapse  Lacey Jensen Virus 2 - negative  TG  442, LDL - 44  HgbA1c - 6.2  VTE prophylaxis - SCDs  Eliquis (apixaban) daily prior to admission, now on ASA 81mg .   Therapy recommendations:  CIR  Disposition:  Pending  Transfer to the floor if stable  Hx stroke/TIA  09/2007 with RLE hp  11/2007 with LLE numbness  01/2008 probable stroke with RUE hp  07/2010 L occipital and R basal ganglia infarct  PFO diagnosed 2009, underwent closure 04/2009 by Dr. Jenne Pane at [facility]  02/2016 left hemianopia status post TPA.  MRI showed right parietal and temporal infarcts.  CT head and neck right P3 occlusion.  EF 60 to 65%.  TEE showed PFO closure device in place.  LDL 79 A1c 6.4.  Discharged with DAPT.  11/2019 admitted for aphasia.  MRI showed left parietal lobe infarct.  MRI high-grade stenosis left A1, moderate stenosis right P2. Loop recorder positive for A. fib at that time, put on Eliquis.  Follows with Dr. Leonie Man at [facility]  PAF->AF w/ RVR Tachypalpitation  Follow with Dr. [name] cardiology  Loop recorder showed subclinical A. Fib  On Eliquis PTA, compliant per husband  On recent cardiac event monitoring with Dr. [name]  Now  on ASA 81.  No AC at the time until [facility] resolves and 7-10 days post stroke  RVR during the night 11/30. Placed on amio gtt w/ metoprolol IV and po.   bolused amio as still w/ transient RVR -> now under control  Amino change to po tonight per cardiology  Hold off metoprolol per cardiology given hx of bradycardia  Cardiology on board, appreciate help  Agitation/confusion/delirium/vascular dementia  Likely due to bilateral acute and chronic infarcts  On four-point restraint -> off now   Not able to get MRI  EKG no QT prolongation  On haldol and ativan PRN  On Seroquel 25 bid  Resume home wellbutrin, effexor  Restless 11/30 w/o sedation all night in AF RVR.   Resumed home xanax 0.5 bid prn  Symptoms improving  May consider precedex if needed  Hypertension Bradycardia  Home BP meds: Norvasc ; Cozaar  Current BP meds: off Cleviprex   SBP goal < 160   BP in the 170s.   Resume Norvasc. One time dose hydralazine  Off metoprolol, will resume cozaar 50 . Long-term BP goal normotensive  Hyperlipidemia  Home Lipid lowering medication: Lipitor 80 mg daily   LDL 44, LDL goal < 70  Resumed Lipitor 80  Continue statin at discharge  Diabetes  Home diabetic meds: metformin  Current diabetic meds: SSI   HgbA1c 6.2, goal < 7.0  CBG monitoring  PCP follow-up  Dysphagia  Failed bedside swallow screen due to coughing and water  Speech on board  Cleared for D2 Nectar thick liquids  On IV fluid @ 40cc -> off now  Tobacco abuse  Current smoker  Smoking cessation counseling provided  Nicotine patch provided  Pt is willing to quit  Other Stroke Risk Factors  Advanced age  Hx PFO s/p closure  Other Active Problems CKD - stage 3b - Cre- 1.60->1.30->1.28->1.32->1.33  Aortic Atherosclerosis (ICD10-I70.0).   Elevated Trop 569->804 EKG ok.   Hypomagnesemia Mg 1.0 - supplement  Hypokalemia K 3.1 - supplement  Hospital day # 4  This  patient is critically ill due to right MCA stroke, SAH, history of multiple strokes, A. fib RVR, agitation, delirium and at significant risk of neurological worsening, death form recurrent stroke, hemorrhagic conversion, SAH extension, heart failure. This patient's care requires constant  monitoring of vital signs, hemodynamics, respiratory and cardiac monitoring, review of multiple databases, neurological assessment, discussion with family, other specialists and medical decision making of high complexity. I spent 35 minutes of neurocritical care time in the care of this patient. I had long discussion with husband at bedside, updated pt current condition, treatment plan and potential prognosis, and answered all the questions.  He expressed understanding and appreciation.    Rosalin Hawking, MD PhD Stroke Neurology 10/06/2020 9:19 AM    To contact Stroke Continuity provider, please refer to [email or web address] After hours, contact General Neurology

## 2020-10-06 NOTE — Progress Notes (Signed)
Inpatient Rehabilitation Admissions Coordinator  Inpatient rehab consult received. I will meet with patient and family tomorrow to begin discussions of a possible Cir admit.  Danne Baxter, RN, MSN Rehab Admissions Coordinator [phone] 10/06/2020 4:36 PM

## 2020-10-07 ENCOUNTER — Other Ambulatory Visit: Payer: Self-pay

## 2020-10-07 ENCOUNTER — Inpatient Hospital Stay (HOSPITAL_COMMUNITY): Payer: Medicare HMO

## 2020-10-07 ENCOUNTER — Encounter (HOSPITAL_COMMUNITY): Payer: Self-pay | Admitting: Neurology

## 2020-10-07 DIAGNOSIS — I69391 Dysphagia following cerebral infarction: Secondary | ICD-10-CM | POA: Diagnosis not present

## 2020-10-07 DIAGNOSIS — I639 Cerebral infarction, unspecified: Secondary | ICD-10-CM | POA: Diagnosis not present

## 2020-10-07 DIAGNOSIS — E1165 Type 2 diabetes mellitus with hyperglycemia: Secondary | ICD-10-CM | POA: Diagnosis not present

## 2020-10-07 DIAGNOSIS — I480 Paroxysmal atrial fibrillation: Secondary | ICD-10-CM | POA: Diagnosis not present

## 2020-10-07 LAB — BASIC METABOLIC PANEL
Anion gap: 12 (ref 5–15)
BUN: 14 mg/dL (ref 8–23)
CO2: 16 mmol/L — ABNORMAL LOW (ref 22–32)
Calcium: 8.9 mg/dL (ref 8.9–10.3)
Chloride: 112 mmol/L — ABNORMAL HIGH (ref 98–111)
Creatinine, Ser: 1.35 mg/dL — ABNORMAL HIGH (ref 0.44–1.00)
GFR, Estimated: 41 mL/min — ABNORMAL LOW (ref >60)
Glucose, Bld: 127 mg/dL — ABNORMAL HIGH (ref 70–99)
Potassium: 3.9 mmol/L (ref 3.5–5.1)
Sodium: 140 mmol/L (ref 135–145)

## 2020-10-07 LAB — CBC
HCT: 32 % — ABNORMAL LOW (ref 36.0–46.0)
Hemoglobin: 11.2 g/dL — ABNORMAL LOW (ref 12.0–15.0)
MCH: 32.9 pg (ref 26.0–34.0)
MCHC: 35 g/dL (ref 30.0–36.0)
MCV: 94.1 fL (ref 80.0–100.0)
Platelets: 243 10*3/uL (ref 150–400)
RBC: 3.4 MIL/uL — ABNORMAL LOW (ref 3.87–5.11)
RDW: 13 % (ref 11.5–15.5)
WBC: 8.6 10*3/uL (ref 4.0–10.5)
nRBC: 0 % (ref 0.0–0.2)

## 2020-10-07 LAB — GLUCOSE, CAPILLARY
Glucose-Capillary: 116 mg/dL — ABNORMAL HIGH (ref 70–99)
Glucose-Capillary: 116 mg/dL — ABNORMAL HIGH (ref 70–99)
Glucose-Capillary: 117 mg/dL — ABNORMAL HIGH (ref 70–99)
Glucose-Capillary: 124 mg/dL — ABNORMAL HIGH (ref 70–99)
Glucose-Capillary: 161 mg/dL — ABNORMAL HIGH (ref 70–99)
Glucose-Capillary: 188 mg/dL — ABNORMAL HIGH (ref 70–99)

## 2020-10-07 LAB — MAGNESIUM: Magnesium: 2.5 mg/dL — ABNORMAL HIGH (ref 1.7–2.4)

## 2020-10-07 MED ORDER — MAGNESIUM OXIDE 400 (241.3 MG) MG PO TABS
400.0000 mg | ORAL_TABLET | Freq: Every day | ORAL | Status: DC
  Administered 2020-10-07 – 2020-10-12 (×6): 400 mg via ORAL
  Filled 2020-10-07 (×6): qty 1

## 2020-10-07 MED ORDER — AMIODARONE HCL 200 MG PO TABS
400.0000 mg | ORAL_TABLET | Freq: Two times a day (BID) | ORAL | Status: DC
  Administered 2020-10-07 – 2020-10-11 (×10): 400 mg via ORAL
  Filled 2020-10-07 (×10): qty 2

## 2020-10-07 MED ORDER — LOSARTAN POTASSIUM 50 MG PO TABS
100.0000 mg | ORAL_TABLET | Freq: Every day | ORAL | Status: DC
  Administered 2020-10-08 – 2020-10-09 (×2): 100 mg via ORAL
  Filled 2020-10-07 (×2): qty 2

## 2020-10-07 MED ORDER — ROSUVASTATIN CALCIUM 20 MG PO TABS
40.0000 mg | ORAL_TABLET | Freq: Every day | ORAL | Status: DC
  Administered 2020-10-07 – 2020-10-12 (×6): 40 mg via ORAL
  Filled 2020-10-07 (×6): qty 2

## 2020-10-07 MED ORDER — BUTALBITAL-APAP-CAFFEINE 50-325-40 MG PO TABS
1.0000 | ORAL_TABLET | Freq: Three times a day (TID) | ORAL | Status: DC | PRN
  Administered 2020-10-07 – 2020-10-11 (×12): 1 via ORAL
  Filled 2020-10-07 (×12): qty 1

## 2020-10-07 NOTE — Progress Notes (Addendum)
Patient Name: Erin Yates   Patient Profile:   Erin Yates is a 74 y.o. female with a hx of prior strokes, DM, hypothyroidism, HTN, AFIB (Dr. [name] describes as SCAF), PFO (s/p closure amplazter 2010), known calcified MV lesion, smoking seen following recurrent stroke on Apixoban  And with AF and RVR  Thrombectomy and stroke complicated by small SAH and Apixoban  Held and now on asa   SUBJECTIVE: tired and anxious to go home Without chest pan or shortness of breath  Past Medical History:  Diagnosis Date  . Anemia    past  hx  . Anxiety   . Depression   . Diabetes mellitus without complication ([place])   . Dysrhythmia    prior to PFO correction  . GERD (gastroesophageal reflux disease)   . Headache    before PFO closure  . Hypercholesteremia   . Hypertension   . Hypothyroidism   . 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 PFO (patent foramen ovale)    correction device placed 2010  . Right leg numbness    lower leg- residual from CVA  . Seasonal allergies   . Stroke [facility])    prior to PFO correction    Scheduled Meds:  Scheduled Meds: .  stroke: mapping our early stages of recovery book   Does not apply Once  . amLODipine  10 mg Oral QPM  . aspirin EC  81 mg Oral Daily  . atorvastatin  80 mg Oral Daily  . buPROPion  150 mg Oral BID  . Chlorhexidine Gluconate Cloth  6 each Topical Q0600  . insulin aspart  0-6 Units Subcutaneous TID WC & HS  . levothyroxine  50 mcg Oral QAC breakfast  . losartan  50 mg Oral Daily  . mouth rinse  15 mL Mouth Rinse BID  . mupirocin ointment  1 application Nasal BID  . nicotine  14 mg Transdermal Daily  . pantoprazole  40 mg Oral Daily  . QUEtiapine  25 mg Oral BID  . venlafaxine XR  150 mg Oral Q breakfast  . vitamin B-12  500 mcg Oral QPM   Continuous Infusions: . amiodarone 30 mg/hr (10/07/20 0400)   acetaminophen **OR** acetaminophen (TYLENOL) oral liquid 160 mg/5 mL **OR** acetaminophen, ALPRAZolam,  haloperidol lactate    PHYSICAL EXAM Vitals:   10/07/20 0400 10/07/20 0500 10/07/20 0600 10/07/20 0700  BP: (!) 147/116 (!) 153/53 (!) 164/67 (!) 167/66  Pulse: (!) 52 (!) 52 (!) 57 (!) 54  Resp: 14 16 17 17   Temp: 99.1 F (37.3 C)     TempSrc: Axillary     SpO2: 97% 95% 100% 94%  Weight:      Height:        BP (!) 167/66   Pulse (!) 54   Temp 99.1 F (37.3 C) (Axillary)   Resp 17   Ht 5\' 5"  (1.651 m)   Wt 61.1 kg   SpO2 94%   BMI 22.42 kg/m  Well developed and nourished in no acute distress HENT normal Neck supple   Regular rate and rhythm,  Abd-soft No Clubbing cyanosis edema Skin-warm and dry A & Oriented  Grossly normal sensory and motor function     TELEMETRY: Reviewed personnally pt in nsr:    Intake/Output Summary (Last 24 hours) at 10/07/2020 0748 Last data filed at 10/07/2020 0530 Gross per 24 hour  Intake 748.05 ml  Output 1400 ml  Net -651.95 ml    LABS: Basic Metabolic Panel: Recent Labs  Lab 10/02/20 1903 10/02/20 1903 10/02/20 1909 10/03/20 0734 10/03/20 0734 10/04/20 0118 10/04/20 0118 10/05/20 0021 10/05/20 0021 10/05/20 0657 10/06/20 0337 10/07/20 0029  NA 136  --  139 140  --  139  --  140  --   --  139 140  K 4.4  --  4.3 4.2  --  3.7  --  3.9  --   --  3.1* 3.9  CL 108  --  108 112*  --  109  --  108  --   --  108 112*  CO2 15*  --   --  17*  --  17*  --  17*  --   --  18* 16*  GLUCOSE 178*  --  174* 85  --  99  --  140*  --   --  115* 127*  BUN 18  --  19 14  --  11  --  11  --   --  13 14  CREATININE 1.60*  --  1.30* 1.46*  --  1.28*  --  1.32*  --   --  1.33* 1.35*  CALCIUM 9.7   < >  --  8.5*   < > 8.8*   < > 8.9   < >  --  8.4* 8.9  MG  --   --   --   --   --   --   --   --   --  1.0*  --  2.5*  PHOS  --   --   --   --   --   --   --   --   --  3.3  --   --    < > = values in this interval not displayed.   Cardiac Enzymes: No results for input(s): CKTOTAL, CKMB, CKMBINDEX, TROPONINI in the last 72  hours. CBC: Recent Labs  Lab 10/02/20 1903 10/02/20 1909 10/03/20 0734 10/04/20 0118 10/05/20 0021 10/06/20 0337 10/07/20 0029  WBC 9.6  --  9.7 11.5* 13.2* 8.4 8.6  NEUTROABS 6.0  --   --   --   --   --   --   HGB 11.7* 12.9 9.7* 10.3* 11.1* 10.4* 11.2*  HCT 36.6 38.0 28.6* 30.5* 32.6* 30.5* 32.0*  MCV 99.7  --  95.7 94.7 93.9 94.4 94.1  PLT 227  --  215 232 255 228 243   PROTIME: No results for input(s): LABPROT, INR in the last 72 hours. Liver Function Tests: No results for input(s): AST, ALT, ALKPHOS, BILITOT, PROT, ALBUMIN in the last 72 hours. No results for input(s): LIPASE, AMYLASE in the last 72 hours. BNP: BNP (last 3 results) No results for input(s): BNP in the last 8760 hours.  ProBNP (last 3 results) No results for input(s): PROBNP in the last 8760 hours.  D-Dimer: No results for input(s): DDIMER in the last 72 hours. Hemoglobin A1C: No results for input(s): HGBA1C in the last 72 hours. Fasting Lipid Panel: No results for input(s): CHOL, HDL, LDLCALC, TRIG, CHOLHDL, LDLDIRECT in the last 72 hours. Thyroid Function Tests: No results for input(s): TSH, T4TOTAL, T3FREE, THYROIDAB in the last 72 hours.  Invalid input(s): FREET3 Anemia Panel: No results for input(s): VITAMINB12, FOLATE, FERRITIN, TIBC, IRON, RETICCTPCT in the last 72 hours.     ASSESSMENT AND PLAN:  Active Problems:   Stroke (cerebrum) (HCC)   Acute ischemic stroke (  [place])   Controlled type 2 diabetes mellitus with hyperglycemia ([place])   Essential hypertension   Atrial fibrillation (HCC)   Dysphagia, post-stroke Bradycardia  Hypokalemia/ hypomagnesemia >> resolved     Change amio to po Change atorvastatin to resuvostatin 2/2 assoc of the former with ICH May be worth considering WATCHMAN in this lady w recurrent strokes despite anticoagulation -- does LAAO help-- the data are more for its impact on reducing of bleeding risk than stroke risk-- will have to review ( again ;))  Have  reviewed data from evwolution study which shows a significant compared to historical controls risk reduction and stroke using watchman.  I will discuss this with Dr. Quentin Ore    Add MgOxide    Signed, Virl Axe MD  10/07/2020

## 2020-10-07 NOTE — Progress Notes (Addendum)
Occupational Therapy Treatment Patient Details Name: Erin Yates MRN: [id number] DOB: [birth year] Today's Date: 10/07/2020    History of present illness 74 y.o. female admitted on 10/02/20 for L sided weakness.  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  Pt with significant PMH of previous stroke with residual visual deficits (peripheral vision loss), but they do not hinder her function,PFO s/p closure, HTN, DM, anxiety, anemia, loop recorder placement.     OT comments  Pt seen in conjunction with PT.  Pt is making steady progress, and now requires set up assist to mod A for ADLs.  She continues to demonstrates deficits with attention and problem solving, as well as impaired balance.  She is slow to respond with decreased reaction times.  She demonstrates significant Lt inattention when there are environmental distractions.  OT was on her Lt while she was ambulating.   Pt required mod cues to locate OT, and repeatedly turned head and body fully to the Rt looking for this OT who was on her Left.   The inattention and inability to systematically visually scan her environment place her at significant risk of injury from running into items, tripping, walking in front of moving objects, etc.  as she is unable to locate obstacles quickly enough to react.  Feel she would benefit from a short CIR stay to maximize her independence and safety to allow her to return home with her spouse at a supervision level for ADLs.  Will follow acutely.   Follow Up Recommendations  CIR    Equipment Recommendations  None recommended by OT    Recommendations for Other Services      Precautions / Restrictions Precautions Precautions: Fall Precaution Comments: new onset afib with RVR, RN okayed OOB       Mobility Bed Mobility Overal bed mobility: Needs Assistance Bed Mobility: Supine to Sit     Supine to sit: Supervision     General bed mobility comments: supervision  for safety   Transfers Overall transfer level: Needs assistance Equipment used: 1 person hand held assist Transfers: Sit to/from Stand;Stand Pivot Transfers Sit to Stand: Min guard Stand pivot transfers: Min assist       General transfer comment: min A to guide her due to distractability and Lt inattentio     Balance Overall balance assessment: Needs assistance Sitting-balance support: Feet unsupported;No upper extremity supported Sitting balance-Leahy Scale: Fair Sitting balance - Comments: able to maintain static sitting with supervision    Standing balance support: No upper extremity supported Standing balance-Leahy Scale: Fair Standing balance comment: able to maintain static standing with min guard assist                            ADL either performed or assessed with clinical judgement   ADL Overall ADL's : Needs assistance/impaired Eating/Feeding: Set up;Sitting   Grooming: Wash/dry hands;Wash/dry face;Oral care;Brushing hair;Minimal assistance;Standing Grooming Details (indicate cue type and reason): assist to locate items  Upper Body Bathing: Minimal assistance;Sitting   Lower Body Bathing: Minimal assistance;Sit to/from stand   Upper Body Dressing : Minimal assistance;Sitting   Lower Body Dressing: Moderate assistance;Sit to/from stand   Toilet Transfer: Minimal assistance;+2 for safety/equipment;Ambulation;Comfort height toilet;Grab bars Toilet Transfer Details (indicate cue type and reason): Pt requires min A for ambulation into bathroom.  When distracted, she runs into items such as door frame, and other objects on the Lt  Toileting-  Clothing Manipulation and Hygiene: Sit to/from stand;Minimal assistance       Functional mobility during ADLs: Minimal assistance;+2 for safety/equipment       Vision   Additional Comments: During pursuits, pt frequently loses fixation on item.  She demonstrates Lt visual field deficit and significant Lt  inattention which is very prominent when she is distracted    Perception     Praxis      Cognition Arousal/Alertness: Awake/alert Behavior During Therapy: WFL for tasks assessed/performed Overall Cognitive Status: Impaired/Different from baseline Area of Impairment: Attention;Following commands;Safety/judgement;Awareness;Problem solving                   Current Attention Level: Sustained   Following Commands: Follows multi-step commands inconsistently;Follows multi-step commands with increased time Safety/Judgement: Decreased awareness of safety Awareness: Intellectual Problem Solving: Slow processing;Difficulty sequencing;Requires verbal cues;Requires tactile cues General Comments: Pt is slow to process info.  As the demands of task increase or if she is distracted, she requires up to mod A for basic problem solving         Exercises     Shoulder Instructions       General Comments VSS     Pertinent Vitals/ Pain       Pain Assessment: No/denies pain Faces Pain Scale: No hurt  Home Living                                          Prior Functioning/Environment              Frequency  Min 2X/week        Progress Toward Goals  OT Goals(current goals can now be found in the care plan section)  Progress towards OT goals: Progressing toward goals     Plan Discharge plan remains appropriate    Co-evaluation    PT/OT/SLP Co-Evaluation/Treatment: Yes Reason for Co-Treatment: For patient/therapist safety   OT goals addressed during session: ADL's and self-care      AM-PAC OT "6 Clicks" Daily Activity     Outcome Measure   Help from another person eating meals?: A Little Help from another person taking care of personal grooming?: A Little Help from another person toileting, which includes using toliet, bedpan, or urinal?: A Little Help from another person bathing (including washing, rinsing, drying)?: A Little Help from another  person to put on and taking off regular upper body clothing?: A Little Help from another person to put on and taking off regular lower body clothing?: A Lot 6 Click Score: 17    End of Session Equipment Utilized During Treatment: Gait belt  OT Visit Diagnosis: Unsteadiness on feet (R26.81);Cognitive communication deficit (R41.841) Symptoms and signs involving cognitive functions: Cerebral infarction   Activity Tolerance Patient tolerated treatment well   Patient Left in chair;with call bell/phone within reach;with chair alarm set   Nurse Communication Mobility status        Time: 8185-6314 OT Time Calculation (min): 32 min  Charges: OT General Charges $OT Visit: 1 Visit OT Treatments $Therapeutic Activity: 8-22 mins  Nilsa Nutting., OTR/L Acute Rehabilitation Services Pager [phone] Office [phone]    Lucille Passy M 10/07/2020, 1:13 PM

## 2020-10-07 NOTE — Progress Notes (Signed)
  Speech Language Pathology Treatment: Dysphagia  Patient Details Name: Erin Yates MRN: [id number] DOB: 12-17-1945 Today's Date: 10/07/2020 Time: 4008-6761 SLP Time Calculation (min) (ACUTE ONLY): 19 min  Assessment / Plan / Recommendation Clinical Impression  Pt was seen for dysphagia treatment with her husband present. Pt was alert and seated upright in her recliner upon SLP's entry. Pt's husband reported that the pt has been tolerating the current diet without difficulty. Pt stated that she likes the orange sherbet but that the rest of the food is "awful" due to the consistency. She tolerated regular texture solids, mixed consistency boluses (i.e., fruit cocktail) and thin liquids via cup and straw without overt s/sx of aspiration. Mastication was mildly prolonged, but pt's husband stated that it was representative of her baseline since she eats slowly. Mild lingual residue was cleared with a liquid wash. It is recommended that her diet be adavanced to regular texture solids and thin liquids. Pt's language skills are back to baseline per the pt's husband and significant word retrieval difficulty was not observed. SLP will continue to follow pt.    HPI HPI: 74 y.o. female admitted on 10/02/20 for L sided weakness.  CT scan revealed a R M2 occlusion and she underwent emergent thrombectomy in IR.  Pt with significant PMH of GERD, previous stroke with residual visual deficits (peripheral vision loss), but they do not hinder her function, PFO s/p closure, HTN, DM, anxiety, anemia, loop recorder placement. Pt was evaluated by SLP in 11/2019 with oropharyngeal swallow WFL but mild-moderate aphasia noted with receptive and expressive difficulties.      SLP Plan  Continue with current plan of care       Recommendations  Diet recommendations: Regular;Thin liquid Liquids provided via: Cup;Straw Medication Administration: Whole meds with puree Supervision: Staff to assist with self feeding;Full  supervision/cueing for compensatory strategies Compensations: Minimize environmental distractions;Slow rate;Small sips/bites;Lingual sweep for clearance of pocketing;Monitor for anterior loss Postural Changes and/or Swallow Maneuvers: Seated upright 90 degrees                Oral Care Recommendations: Oral care BID Follow up Recommendations: Inpatient Rehab SLP Visit Diagnosis: Dysphagia, unspecified (R13.10) Plan: Continue with current plan of care       Erin Yates I. Erin Yates, Erin Yates, Erin Yates Office number [phone] Pager [place] 10/07/2020, 12:04 PM

## 2020-10-07 NOTE — PMR Pre-admission (Shared)
PMR Admission Coordinator Pre-Admission Assessment  Patient: Erin Yates is an 74 y.o., female MRN: [id number] DOB: 07-09-1946 Height: 5\' 5"  (165.1 cm) Weight: 61.1 kg              Insurance Information HMO: yes    PPO:      PCP:      IPA:      80/20:      OTHER:  PRIMARY: [employer]      Policy#: [id number]      Subscriber: pt CM Name: Lars Mage      Phone#: [phone] ext [phone]   Fax#: [phone] Pre-Cert#: [id number] approved for 7 days/auth good 12/3 until 12/8 to get her admitted     Employer: n/a Benefits:  Phone #: [phone]     Name: 12/2 Eff. Date: 11/07/2019     Deduct: none      Out of Pocket Max: $3900       CIR: $295 co pay per day days 1 until 6      SNF: no co pay days 1 until 20: $184 co pay per day days 21 until 100 Outpatient: $10 to $40 per visit     Co-Pay: visits per medical neccesity Home Health: 100%      Co-Pay: visits per medical neccesity DME: 80%     Co-Pay: 20% Providers: in network  SECONDARY: none  Financial Counselor:       Phone#:   [employer]" for patients in Inpatient Rehabilitation Facilities with attached "Privacy Act [place] Records" was provided and verbally reviewed with: Patient and Family  Emergency Contact Information Contact Information    Name Relation Home Work Mobile   [place] L Spouse [phone]     Deboraha Sprang Daughter   [phone]     Current Medical History  Patient Admitting Diagnosis: CVA  History of Present Illness: 74 y.o. right-handed female with history of diabetes mellitus, hypertension, atrial fibrillation maintained on Eliquis as well as previous PFO closure 05/2009, tobacco abuse.  Independent ambulation distances without assistive device.  She presented on 10/02/2020 with left hemiparesis.  CT of the head unremarkable for acute intracranial process.  Multiple chronic infarcts.  CT angiogram of head and neck emergent proximal right M2 occlusion.  Patient  underwent mechanical thrombectomy per interventional radiology.  Follow-up CT personally reviewed, limited study.  Per report, severely limited study findings consistent with evolving posterior right MCA territory infarct with progressive edema.  There was hyper density in the region of the right sylvian fissure, concerning for acute SAH but suboptimally evaluated.  Echo cardiogram with ejection fraction of 60-65%, no wall motion abnormalities.  Neurology follow-up Eliquis currently on hold due to small volume SAH and maintain on low-dose aspirin.  Cardiology service consulted 10/05/2020 for atrial fibrillation with RVR and did receive IV metoprolol and IV amiodarone.  Currently maintained on dysphagia #2 nectar thick liquid diet.   Complete NIHSS TOTAL: 2 Glasgow Coma Scale Score: 15  Past Medical History  Past Medical History:  Diagnosis Date  . Anemia    past  hx  . Anxiety   . Depression   . Diabetes mellitus without complication ([place])   . Dysrhythmia    prior to PFO correction  . GERD (gastroesophageal reflux disease)   . Headache    before PFO closure  . Hypercholesteremia   . Hypertension   . Hypothyroidism   . 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 PFO (patent foramen  ovale)    correction device placed 2010  . Right leg numbness    lower leg- residual from CVA  . Seasonal allergies   . Stroke [facility])    prior to PFO correction    Family History  family history includes Breast cancer (age of onset: 20) in her sister; Dementia in her father.  Prior Rehab/Hospitalizations:  Has the patient had prior rehab or hospitalizations prior to admission? Yes  Has the patient had major surgery during 100 days prior to admission? No  Current Medications   Current Facility-Administered Medications:  .   stroke: mapping our early stages of recovery book, , Does not apply, Once, Greta Doom, MD .  acetaminophen (TYLENOL) tablet 650 mg, 650 mg, Oral, Q4H PRN, 650 mg  at 10/09/20 0152 **OR** acetaminophen (TYLENOL) 160 MG/5ML solution 650 mg, 650 mg, Per Tube, Q4H PRN **OR** acetaminophen (TYLENOL) suppository 650 mg, 650 mg, Rectal, Q4H PRN, Greta Doom, MD, 650 mg at 10/04/20 0737 .  ALPRAZolam Duanne Moron) tablet 0.5 mg, 0.5 mg, Oral, BID PRN, Hayden Pedro M, NP, 0.5 mg at 10/08/20 1700 .  amiodarone (PACERONE) tablet 400 mg, 400 mg, Oral, BID, Deboraha Sprang, MD, 400 mg at 10/09/20 0932 .  amLODipine (NORVASC) tablet 10 mg, 10 mg, Oral, QPM, Biby, Sharon L, NP, 10 mg at 10/08/20 1700 .  aspirin EC tablet 81 mg, 81 mg, Oral, Daily, Rosalin Hawking, MD, 81 mg at 10/09/20 0933 .  buPROPion [facility] SR) 12 hr tablet 150 mg, 150 mg, Oral, BID, Rosalin Hawking, MD, 150 mg at 10/09/20 0932 .  butalbital-acetaminophen-caffeine (FIORICET) 50-325-40 MG per tablet 1 tablet, 1 tablet, Oral, Q8H PRN, Rosalin Hawking, MD, 1 tablet at 10/09/20 0531 .  haloperidol lactate (HALDOL) injection 5 mg, 5 mg, Intravenous, Q6H PRN, Rosalin Hawking, MD, 5 mg at 10/04/20 1838 .  insulin aspart (novoLOG) injection 0-6 Units, 0-6 Units, Subcutaneous, TID WC & HS, Rosalin Hawking, MD, 1 Units at 10/07/20 1240 .  levothyroxine (SYNTHROID) tablet 50 mcg, 50 mcg, Oral, QAC breakfast, Rosalin Hawking, MD, 50 mcg at 10/09/20 0530 .  losartan (COZAAR) tablet 100 mg, 100 mg, Oral, Daily, Rosalin Hawking, MD, 100 mg at 10/09/20 0936 .  magnesium oxide (MAG-OX) tablet 400 mg, 400 mg, Oral, Daily, Deboraha Sprang, MD, 400 mg at 10/09/20 0934 .  MEDLINE mouth rinse, 15 mL, Mouth Rinse, BID, Rosalin Hawking, MD, 15 mL at 10/09/20 0934 .  nicotine (NICODERM CQ - dosed in mg/24 hours) patch 14 mg, 14 mg, Transdermal, Daily, Rosalin Hawking, MD, 14 mg at 10/09/20 0932 .  pantoprazole (PROTONIX) EC tablet 40 mg, 40 mg, Oral, Daily, Rosalin Hawking, MD, 40 mg at 10/09/20 0934 .  QUEtiapine (SEROQUEL) tablet 25 mg, 25 mg, Oral, BID, Rosalin Hawking, MD, 25 mg at 10/09/20 0933 .  rosuvastatin (CRESTOR) tablet 40 mg, 40 mg, Oral, Daily,  Deboraha Sprang, MD, 40 mg at 10/09/20 0933 .  senna (SENOKOT) tablet 8.6 mg, 1 tablet, Oral, Daily, Rosalin Hawking, MD, 8.6 mg at 10/09/20 0933 .  venlafaxine XR (EFFEXOR-XR) 24 hr capsule 150 mg, 150 mg, Oral, Q breakfast, Rosalin Hawking, MD, 150 mg at 10/09/20 0932 .  vitamin B-12 (CYANOCOBALAMIN) tablet 500 mcg, 500 mcg, Oral, QPM, Rosalin Hawking, MD, 500 mcg at 10/08/20 1700  Patients Current Diet:  Diet Order            Diet Carb Modified Fluid consistency: Thin; Room service appropriate? Yes  Diet effective now  Precautions / Restrictions Precautions Precautions: Fall Precaution Comments: RN cleared pt for session Restrictions Weight Bearing Restrictions: No   Has the patient had 2 or more falls or a fall with injury in the past year?No  Prior Activity Level Community (5-7x/wk): Independent, slow gait left peripheral vision issues pta per spouse  Prior Functional Level Prior Function Level of Independence: Independent Comments: Ind amb community distances without an AD, no fall history, Ind with ADLs  Self Care: Did the patient need help bathing, dressing, using the toilet or eating?  Independent  Indoor Mobility: Did the patient need assistance with walking from room to room (with or without device)? Independent  Stairs: Did the patient need assistance with internal or external stairs (with or without device)? Independent  Functional Cognition: Did the patient need help planning regular tasks such as shopping or remembering to take medications? Independent  Home Assistive Devices / Equipment Home Assistive Devices/Equipment: None Home Equipment: Environmental consultant - 2 wheels, [place] - single point, [employer], Grab bars - tub/shower, Hand held shower head  Prior Device Use: Indicate devices/aids used by the patient prior to current illness, exacerbation or injury? None of the above  Current Functional Level Cognition  Arousal/Alertness: Awake/alert Overall Cognitive  Status: Impaired/Different from baseline Current Attention Level: Sustained Orientation Level: Oriented X4 Following Commands: Follows multi-step commands inconsistently, Follows one step commands consistently, Follows one step commands with increased time, Follows multi-step commands with increased time Safety/Judgement: Decreased awareness of safety, Decreased awareness of deficits General Comments: Pt unaware of L leg being placed lateral to RW during turns and requires extensive cues to gain attention to L leg and correct. Pt bumps obstacles with L and attempts to leave RW multiple times during session and requires cues to maintain safety. Attention: Sustained Sustained Attention: Impaired Sustained Attention Impairment: Verbal basic, Functional basic Memory: Impaired Memory Impairment: Decreased recall of new information Awareness: Impaired Awareness Impairment: Intellectual impairment, Emergent impairment, Anticipatory impairment Problem Solving: Impaired Problem Solving Impairment: Functional basic, Verbal basic Behaviors: Impulsive Safety/Judgment: Impaired    Extremity Assessment (includes Sensation/Coordination)  Upper Extremity Assessment: LUE deficits/detail LUE Deficits / Details: Movement in [place] stage 5 with shoulder flexion ~115* actively.  Mild flexor synergies noted  LUE Coordination: decreased fine motor, decreased gross motor  Lower Extremity Assessment: Defer to PT evaluation RLE Deficits / Details: bil LE weakness 3+/5 with L more than R at this time.  Needs visual and verbal cues to move left side (helps if she looks at her arm or leg when the command is given).   LLE Deficits / Details: bil LE weakness 3+/5 with L more than R at this time.  Needs visual and verbal cues to move left side (helps if she looks at her arm or leg when the command is given).      ADLs  Overall ADL's : Needs assistance/impaired Eating/Feeding: Set up, Sitting Grooming: Wash/dry  hands, Wash/dry face, Oral care, Brushing hair, Minimal assistance, Standing Grooming Details (indicate cue type and reason): assist to locate items  Upper Body Bathing: Minimal assistance, Sitting Lower Body Bathing: Minimal assistance, Sit to/from stand Upper Body Dressing : Minimal assistance, Sitting Lower Body Dressing: Moderate assistance, Sit to/from stand Toilet Transfer: Minimal assistance, +2 for safety/equipment, Ambulation, Comfort height toilet, Grab bars Toilet Transfer Details (indicate cue type and reason): Pt requires min A for ambulation into bathroom.  When distracted, she runs into items such as door frame, and other objects on the Lt  Toileting- Clothing Manipulation and  Hygiene: Sit to/from stand, Minimal assistance Toileting - Clothing Manipulation Details (indicate cue type and reason): assist to locate toilet paper, steadying assist in standing while pt performing pericare  Functional mobility during ADLs: Minimal assistance, +2 for safety/equipment    Mobility  Overal bed mobility: Needs Assistance Bed Mobility: Supine to Sit, Sit to Supine Supine to sit: Supervision, HOB elevated Sit to supine: Supervision, HOB elevated General bed mobility comments: supervision for safety     Transfers  Overall transfer level: Needs assistance Equipment used: Rolling walker (2 wheeled) Transfers: Sit to/from Stand Sit to Stand: Min guard Stand pivot transfers: Min assist General transfer comment: Min guard for safety, cues for proper hand placement.    Ambulation / Gait / Stairs / Wheelchair Mobility  Ambulation/Gait Ambulation/Gait assistance: Herbalist (Feet): 120 Feet Assistive device: Rolling walker (2 wheeled) Gait Pattern/deviations: Step-through pattern, Decreased stride length, Decreased step length - left, Narrow base of support, Trunk flexed, Drifts right/left, Decreased dorsiflexion - right, Decreased dorsiflexion - left General Gait Details:  Intermittent LOB resulting in minA to maintain safety. Pt tends to stay more proximal to R side of RW. When turning pt steps L foot laterally outside RW and requires repeated extensive cues to acknowledge it and correct. Intermittent bouts of hitting obstacles with RW on L side. Pt tried to leave RW 3x during session and required cues to keep it with her for safety. Verbal and visual cues for heel-to-toe and inc step length. Gait velocity: decreased significantly Gait velocity interpretation: <1.31 ft/sec, indicative of household ambulator    Posture / Balance Dynamic Sitting Balance Sitting balance - Comments: able to maintain static sitting with supervision  Balance Overall balance assessment: Needs assistance Sitting-balance support: Feet unsupported, No upper extremity supported Sitting balance-Leahy Scale: Fair Sitting balance - Comments: able to maintain static sitting with supervision  Standing balance support: Bilateral upper extremity supported, During functional activity Standing balance-Leahy Scale: Poor Standing balance comment: able to maintain static standing with min guard assist     Special needs/care consideration LOOP recorder implanted during previous admit hgb A1c 6.2 Smoker with nicotine patch provided   Previous Home Environment  Living Arrangements: Spouse/significant other  Lives With: Spouse Available Help at Discharge: Family, Available 24 hours/day Type of Home: House Home Layout: Two level, Able to live on main level with bedroom/bathroom Home Access: Stairs to enter Entrance Stairs-Rails: Right, Left, Can reach both Entrance Stairs-Number of Steps: 3 Bathroom Shower/Tub: Tub/shower unit, Architectural technologist: Standard Bathroom Accessibility: Yes How Accessible: Accessible via walker Home Care Services: No Additional Comments: wears glasses for reading, not here in the hospital.  Likes coloring (may need her glasses to do this successfully).    Discharge Living Setting Plans for Discharge Living Setting: Patient's home, Lives with (comment) (spouse) Type of Home at Discharge: House Discharge Home Layout: Two level, Able to live on main level with bedroom/bathroom Discharge Home Access: Stairs to enter Entrance Stairs-Rails: Right, Left, Can reach both Entrance Stairs-Number of Steps: 3 Discharge Bathroom Shower/Tub: Tub/shower unit Discharge Bathroom Toilet: Standard Discharge Bathroom Accessibility: Yes How Accessible: Accessible via walker Does the patient have any problems obtaining your medications?: No  Social/Family/Support Systems Patient Roles: Spouse, Parent Contact Information: spouse, [name] Anticipated Caregiver: spouse Anticipated Ambulance person Information: see above Ability/Limitations of Caregiver: no limitations Caregiver Availability: 24/7 Discharge Plan Discussed with Primary Caregiver: Yes Is Caregiver In Agreement with Plan?: Yes Does Caregiver/Family have Issues with Lodging/Transportation while Pt is in Rehab?: No  Goals Patient/Family Goal for Rehab: Mod I to supervision with PT, OT, and SLP Expected length of stay: ELOS 8 to 12 days Pt/Family Agrees to Admission and willing to participate: Yes Program Orientation Provided & Reviewed with Pt/Caregiver Including Roles  & Responsibilities: Yes  Decrease burden of Care through IP rehab admission: n/a  Possible need for SNF placement upon discharge:not anticipated  Patient Condition: This patient's medical and functional status has changed since the consult dated: 10/06/2020 in which the Rehabilitation Physician determined and documented that the patient's condition is appropriate for intensive rehabilitative care in an inpatient rehabilitation facility. See "History of Present Illness" (above) for medical update. Functional changes are: ***. Patient's medical and functional status update has been discussed with the Rehabilitation physician and  patient remains appropriate for inpatient rehabilitation. Will admit to inpatient rehab today.  Preadmission Screen Completed By: Danne Baxter RN MSN with updates by Julious Payer, Audelia Acton, RN, 10/09/2020 9:59 AM ______________________________________________________________________   Discussed status with Dr. Marland Kitchenon***at *** and received approval for admission today.  Admission Coordinator: Danne Baxter RN MSN with updates by Julious Payer, Audelia Acton, time***/Date***

## 2020-10-07 NOTE — Progress Notes (Signed)
Physical Therapy Treatment Patient Details Name: Erin Yates MRN: [id number] DOB: 1946/05/20 Today's Date: 10/07/2020    History of Present Illness 74 y.o. female admitted on 10/02/20 for L sided weakness.  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  Pt with significant PMH of previous stroke with residual visual deficits (peripheral vision loss), but they do not hinder her function,PFO s/p closure, HTN, DM, anxiety, anemia, loop recorder placement.      PT Comments    The pt is continuing to demo good progress with mobility and PT goals. She was able to tolerate ambulation both in her room and hallway, but continues to require physical assist of 1-2 for stability and safety. The pt presents with significant deficits in strength and stability resulting in small, shuffling, steps with narrow BOS even when cued for improved technique. The pt's mobility is also significantly limited by poor safety awareness due to L-sided neglect and inattention that is even more severe with distractions present. This significantly increases the pt's risk of falls and fall-related injury without continued intervention. The pt will continue to benefit from skilled PT to facilitate return to functional independence with mobility.    Follow Up Recommendations  CIR     Equipment Recommendations  None recommended by PT    Recommendations for Other Services       Precautions / Restrictions Precautions Precautions: Fall Precaution Comments: new onset afib with RVR, RN okayed OOB Restrictions Weight Bearing Restrictions: No    Mobility  Bed Mobility Overal bed mobility: Needs Assistance Bed Mobility: Supine to Sit     Supine to sit: Supervision     General bed mobility comments: supervision for safety   Transfers Overall transfer level: Needs assistance Equipment used: 1 person hand held assist Transfers: Sit to/from Stand;Stand Pivot Transfers Sit to Stand:  Min guard Stand pivot transfers: Min assist       General transfer comment: pt able to power up but requires either minA when legs not braced on bed or close supervision with legs braced on bed for support.   Ambulation/Gait Ambulation/Gait assistance: Min assist;+2 safety/equipment;+2 physical assistance Gait Distance (Feet): 150 Feet Assistive device: 1 person hand held assist Gait Pattern/deviations: Step-through pattern;Decreased stride length;Shuffle;Decreased step length - left;Narrow base of support Gait velocity: 0.36 m/s Gait velocity interpretation: <1.31 ft/sec, indicative of household ambulator General Gait Details: minA of 1 to steady with cues for larger steps and improved clearance. Pt unable to maintain without direct cues. narrow BOS with poor functional awareness. Ran into objects on L side when not cued   Modified Rankin (Stroke Patients Only) Modified Rankin (Stroke Patients Only) Pre-Morbid Rankin Score: Slight disability Modified Rankin: Moderately severe disability     Balance Overall balance assessment: Needs assistance Sitting-balance support: Feet unsupported;No upper extremity supported Sitting balance-Leahy Scale: Fair Sitting balance - Comments: able to maintain static sitting with supervision    Standing balance support: No upper extremity supported Standing balance-Leahy Scale: Fair Standing balance comment: able to maintain static standing with min guard assist                             Cognition Arousal/Alertness: Awake/alert Behavior During Therapy: WFL for tasks assessed/performed Overall Cognitive Status: Impaired/Different from baseline Area of Impairment: Attention;Following commands;Safety/judgement;Awareness;Problem solving                   Current Attention Level: Sustained  Following Commands: Follows multi-step commands inconsistently;Follows multi-step commands with increased time Safety/Judgement: Decreased  awareness of safety Awareness: Intellectual Problem Solving: Slow processing;Difficulty sequencing;Requires verbal cues;Requires tactile cues General Comments: Pt is slow to process info.  As the demands of task increase or if she is distracted, she requires up to mod A for basic problem solving. Also with noted L-side inattention that is more severe with distraction         General Comments General comments (skin integrity, edema, etc.): VSS      Pertinent Vitals/Pain Pain Assessment: No/denies pain Pain Intervention(s): Monitored during session           PT Goals (current goals can now be found in the care plan section) Acute Rehab PT Goals Patient Stated Goal: to go home before her granddaughter leaves for [place] PT Goal Formulation: With patient/family Time For Goal Achievement: 10/17/20 Potential to Achieve Goals: Good Progress towards PT goals: Progressing toward goals    Frequency    Min 4X/week      PT Plan Current plan remains appropriate    Co-evaluation PT/OT/SLP Co-Evaluation/Treatment: Yes Reason for Co-Treatment: For patient/therapist safety PT goals addressed during session: Mobility/safety with mobility;Balance OT goals addressed during session: ADL's and self-care      AM-PAC PT "6 Clicks" Mobility   Outcome Measure  Help needed turning from your back to your side while in a flat bed without using bedrails?: A Little Help needed moving from lying on your back to sitting on the side of a flat bed without using bedrails?: A Little Help needed moving to and from a bed to a chair (including a wheelchair)?: A Little Help needed standing up from a chair using your arms (e.g., wheelchair or bedside chair)?: A Little Help needed to walk in hospital room?: A Little Help needed climbing 3-5 steps with a railing? : A Lot 6 Click Score: 17    End of Session Equipment Utilized During Treatment: Gait belt Activity Tolerance: Patient tolerated treatment  well;Patient limited by fatigue Patient left: in chair;with call bell/phone within reach;with family/visitor present Nurse Communication: Mobility status PT Visit Diagnosis: Muscle weakness (generalized) (M62.81);Difficulty in walking, not elsewhere classified (R26.2);Hemiplegia and hemiparesis Hemiplegia - Right/Left: Left Hemiplegia - dominant/non-dominant: Non-dominant Hemiplegia - caused by: Cerebral infarction     Time: 1137-1209 PT Time Calculation (min) (ACUTE ONLY): 32 min  Charges:  $Gait Training: 8-22 mins                     Karma Ganja, PT, DPT   Acute Rehabilitation Department Pager #: [phone]   Otho Bellows 10/07/2020, 2:08 PM

## 2020-10-07 NOTE — Progress Notes (Signed)
Inpatient Rehabilitation Admissions Coordinator  I met with patient at bedside and spoke with her spouse by phone. We discussed goals and expectations of a possible CIR admit. Spouse states she has poor left peripheral vision from past strokes and has a slow cautious gait because of this. He would prefer her to receive Cir admit prior to d/c home. I will begin insurance approval with [facility] Medicare for a possible Cir admit pending insurance approval and Bed availability.  Danne Baxter, RN, MSN Rehab Admissions Coordinator [phone] 10/07/2020 12:54 PM

## 2020-10-07 NOTE — Progress Notes (Signed)
STROKE TEAM PROGRESS NOTE   INTERVAL HISTORY Husband at bedside.  Patient sitting in bed, calm, no agitation or restlessness.  Cooperative on exam.  Seems to still have right lower quadrantanopia.  Orientated to time, place and people and age.  Much improved from the day before.  Repeat CT showed stable small subretinal hemorrhage at right sylvian fissure and large right MCA stroke.  OBJECTIVE Vitals:   10/07/20 0500 10/07/20 0600 10/07/20 0700 10/07/20 0800  BP: (!) 153/53 (!) 164/67 (!) 167/66 136/89  Pulse: (!) 52 (!) 57 (!) 54 (!) 56  Resp: 16 17 17 15   Temp:    98.6 F (37 C)  TempSrc:    Oral  SpO2: 95% 100% 94% 95%  Weight:      Height:       CBC:  Recent Labs  Lab 10/02/20 1903 10/02/20 1909 10/06/20 0337 10/07/20 0029  WBC 9.6   < > 8.4 8.6  NEUTROABS 6.0  --   --   --   HGB 11.7*   < > 10.4* 11.2*  HCT 36.6   < > 30.5* 32.0*  MCV 99.7   < > 94.4 94.1  PLT 227   < > 228 243   < > = values in this interval not displayed.   Basic Metabolic Panel:  Recent Labs  Lab 10/05/20 0021 10/05/20 0657 10/06/20 0337 10/07/20 0029  NA   < >  --  139 140  K   < >  --  3.1* 3.9  CL   < >  --  108 112*  CO2   < >  --  18* 16*  GLUCOSE   < >  --  115* 127*  BUN   < >  --  13 14  CREATININE   < >  --  1.33* 1.35*  CALCIUM   < >  --  8.4* 8.9  MG  --  1.0*  --  2.5*  PHOS  --  3.3  --   --    < > = values in this interval not displayed.   Lipid Panel:     Component Value Date/Time   CHOL 115 10/03/2020 0105   TRIG 442 (H) 10/03/2020 0105   HDL 35 (L) 10/03/2020 0105   CHOLHDL 3.3 10/03/2020 0105   VLDL UNABLE TO CALCULATE IF TRIGLYCERIDE OVER 400 mg/dL 10/03/2020 0105   LDLCALC UNABLE TO CALCULATE IF TRIGLYCERIDE OVER 400 mg/dL 10/03/2020 0105   HgbA1c:  Lab Results  Component Value Date   HGBA1C 6.2 (H) 10/03/2020   Urine Drug Screen: No results found for: LABOPIA, COCAINSCRNUR, LABBENZ, AMPHETMU, THCU, LABBARB  Alcohol Level     Component Value Date/Time    ETH <10 10/03/2020 0105    IMAGING  CT HEAD CODE STROKE WO CONTRAST 10/02/2020 1. No acute infarct or intracranial hemorrhage identified.  2. ASPECTS is 10.  3. Hyperdense right MCA which will be evaluated on pending CTA.  4. Multiple chronic infarcts.   CT Code Stroke CTA Head W/WO contrast CT Code Stroke CTA Neck W/WO contrast 10/02/2020 1. Emergent proximal right M2 occlusion.  2. Mild carotid and vertebral atherosclerosis without significant stenosis.  3.  Aortic Atherosclerosis (ICD10-I70.0).   Cerebral Angio / IR 10/02/2020 R M2 occlusion (TICI0). 1st pass-Trevo 59mm & CAT 6 w/ aspiration. 2nd pass-Trevo 2mm & CAT 5 w/ aspiration. 3rd pass-Solitaire 77mm & Zoom 71 w/ aspiration. Final TICI3.   Post IR CT 10/02/2020 Small volume SAH at the  sylvian fissure and right perisylvian sulci         CT HEAD WO CONTRAST 10/03/2020 Nondiagnostic evaluation due to patient intolerance and motion. The technologist was unable to perform a repeat exam due to safety Concern.  CT HEAD WO CONTRAST 10/04/2020  Severely limited study with findings consistent with an evolving posterior right MCA territory infarct with progressive edema. Thereis hyperdensity in the region of the right sylvian fissure, concerning for acute subarachnoid hemorrhage but suboptimally evaluated. There is sulcal effacement without substantial midline shift.  CT HEAD WO CONTRAST  Result Date: 10/07/2020 CLINICAL DATA:  Stroke EXAM: CT HEAD WITHOUT CONTRAST TECHNIQUE: Contiguous axial images were obtained from the base of the skull through the vertex without intravenous contrast. COMPARISON:  10/04/2020, 10/03/2020 FINDINGS: Brain: Today's study is of better quality without significant motion. Large area of acute infarct in the right posterior MCA territory involving the posterior basal ganglia and the temporoparietal lobe. This shows progressive edema. There is hyperdense hemorrhage in the right sylvian fissure. This was  seen on the recent study but was obscured by motion and appears similar. Chronic infarcts in the occipital lobe bilaterally. Chronic infarct left frontal and parietal lobe. Ventricle size normal.  No midline shift. Vascular: Negative for hyperdense vessel Skull: Negative Sinuses/Orbits: Paranasal sinuses clear. Prior sinus surgery. Bilateral cataract extraction. Other: None IMPRESSION: Large area of acute infarct right posterior MCA territory involving basal ganglia and temporoparietal lobe. Mild amount of subarachnoid hemorrhage in the right sylvian fissure unchanged from 10/04/2020. Multiple chronic cortical infarcts. Findings suggest recurrent cerebral emboli. Electronically Signed   By: Franchot Gallo M.D.   On: 10/07/2020 15:48    Transthoracic Echocardiogram  10/04/2020 1. Left ventricular ejection fraction, by estimation, is 60 to 65%. The left ventricle has normal function. The left ventricle has no regional wall motion abnormalities. 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 Left ventricular diastolic parameters are indeterminate.  2. Right ventricular systolic function is normal. The right ventricular size is normal. Tricuspid regurgitation signal is inadequate for assessing PA pressure.  3. The mitral valve is degenerative. Mild mitral valve regurgitation. Mild mitral stenosis. 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  4. The aortic valve is tricuspid. Aortic valve regurgitation is not visualized. No aortic stenosis is present.  5. The inferior vena cava is normal in size with greater than 50% respiratory variability, suggesting right atrial pressure of 3 mmHg.   ECG - Sinus rhythm with Premature supraventricular complexes; Left anterior fascicular block; Minimal voltage criteria for LVH, may be normal variant ( R in aVL )    PHYSICAL EXAM   Temp:  [98.4 F (36.9 C)-99.1 F (37.3 C)] 98.6 F (37 C) (12/02 0800) Pulse Rate:  [45-73] 56 (12/02 0800) Resp:  [14-30] 15 (12/02 0800) BP: (116-167)/(48-116) 136/89 (12/02 0800) SpO2:  [94 %-100 %] 95 % (12/02 0800)  General - Well nourished, well developed, not in acute distress.  Ophthalmologic - fundi not visualized due to noncooperation.  Cardiovascular - regular rate and rhythm, not in A. fib  Neuro - awake, alert, orientated to place, time, age and people. No aphasia, following simple commands. Decreased visual acuity, with left lower quadrantanopsia. No gaze palsy but right gaze preference.  Facial symmetrical, tongue protrusion midline. Left UE 4/5 proximal with mild drift and 4-/5 distal with decreased finger grip, RUE at least 4+/5 proximal and distal. BLE  4/5. Sensation symmetrical subjectively, coordination intact b/l FTN and gait not tested.   ASSESSMENT/PLAN [name] is a 74 y.o. female with history of diabetes, hypothyroidism, ongoing tobacco use, HTN, HLD, hx of PFO closure, implantable loop, hypertension, atrial fibrillation on eliquis as well as multiple previous strokes (residual visual deficits) who presents with left-sided weakness, a fall, rightward gaze, and confusion. She did not receive IV t-PA due to anticoagulation with Eliquis. 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 However, pt does have multiple stroke risk factors.   CT Head -  No acute infarct or intracranial hemorrhage identified. ASPECTS is 10. Hyperdense right MCA. Multiple chronic infarcts.   CTA H&N - Emergent proximal right M2 occlusion. Mild carotid and vertebral atherosclerosis without significant stenosis.   IR TICI3 of right M2 occlusion, Small volume SAH at the sylvian fissure and right  perisylvian sulci    CT head 11/29 an evolving posterior right MCA territory infarct with progressive edema. There is hyperdensity in the region of the right sylvian fissure  MRI and MRA head - initially hold off due to agitation and noncooperation, now pt refused due to claustrophobia  Repeat CT 12/2 unchanged large right MCA stroke with small sylvian fissure SAH  2D Echo - EF 60-65.%. degenerative MV w/ echodensity base posterior leaflet c/w severe P3 prolapse  Lacey Jensen Virus 2 - negative  TG 442, LDL - 44  HgbA1c - 6.2  VTE prophylaxis - SCDs  Eliquis (apixaban) daily prior to admission, now on ASA 81mg .  Consider resume AC once SAH resolves.  Therapy recommendations:  CIR  Disposition:  Pending  Transfer to the floor 12/2 - await bed  Hx stroke/TIA  09/2007 with RLE hp  11/2007 with LLE numbness  01/2008 probable stroke with RUE hp  07/2010 L occipital and R basal ganglia infarct  PFO diagnosed 2009, underwent closure 04/2009 by Dr. Jenne Pane at [facility]  02/2016 left hemianopia status post TPA.  MRI showed right parietal and temporal infarcts.  CT head and neck right P3 occlusion.  EF 60 to 65%.  TEE showed PFO closure device in place.  LDL 79 A1c 6.4.  Discharged with DAPT.  11/2019 admitted for aphasia.  MRI showed left parietal lobe infarct.  MRI high-grade stenosis left A1, moderate stenosis right P2. Loop recorder positive for A. fib at that time, put on Eliquis.  Follows with Dr. Leonie Man at [facility]  PAF->AF w/ RVR Tachypalpitation  Follow with Dr. [name] cardiology  Loop recorder showed subclinical A. Fib  On Eliquis PTA, compliant per husband  On recent cardiac event monitoring with Dr. [name]  Now on ASA 81  No AC at the time until [facility] resolves.  RVR during the night 11/30. Placed on amio gtt w/ metoprolol IV and po.   bolused IV amio as still w/ transient RVR -> now under control  On po Amino   Hold off metoprolol per cardiology given hx of  bradycardia  Cardiology on board, appreciate help  Per Dr. [name], may be candidate for [facility] device. He will see pt in clinic to further discuss  Agitation/confusion/delirium/vascular dementia  Likely due to bilateral acute and chronic infarcts  On four-point restraint -> off now   Not able to get MRI  EKG no QT prolongation  On haldol  and ativan PRN  On Seroquel 25 bid  Resume home wellbutrin, effexor  Restless 11/30 w/o sedation all night in AF RVR.   Resumed home xanax 0.5 bid prn  Symptoms improved  Hypertension Bradycardia  Home BP meds: Norvasc ; Cozaar  Current BP meds: off Cleviprex   SBP goal < 160   BP in the 150s  Resume Norvasc 10  Off metoprolol, resumed cozaar 50->100 . Long-term BP goal normotensive  Hyperlipidemia  Home Lipid lowering medication: Lipitor 80 mg daily   LDL 44, LDL goal < 70  Lipitor 80 -> crestor 40   Continue statin at discharge  Diabetes  Home diabetic meds: metformin  Current diabetic meds: SSI   HgbA1c 6.2, goal < 7.0  CBG monitoring  PCP follow-up  Dysphagia  Failed bedside swallow screen due to coughing and water  Speech on board  Cleared for D2 Nectar thick liquids -> regular DM diet with thin  On IV fluid @ 40cc -> off now  Tobacco abuse  Current smoker  Smoking cessation counseling provided  Nicotine patch provided  Pt is willing to quit  Other Stroke Risk Factors  Advanced age  Hx PFO s/p closure  Other Active Problems CKD - stage 3b - Cre- 1.60->1.30->1.28->1.32->1.33->1.35  Aortic Atherosclerosis (ICD10-I70.0).   Elevated Trop 569->804 EKG ok.   Hypomagnesemia Mg 1.0 - supplement - 2.5 - on po supplement  Hypokalemia K 3.1 ->3.9  Hospital day # 5   Rosalin Hawking, MD PhD Stroke Neurology 10/07/2020 11:18 AM  To contact Stroke Continuity provider, please refer to [email or web address] After hours, contact General Neurology

## 2020-10-08 DIAGNOSIS — I639 Cerebral infarction, unspecified: Secondary | ICD-10-CM | POA: Diagnosis not present

## 2020-10-08 DIAGNOSIS — E1165 Type 2 diabetes mellitus with hyperglycemia: Secondary | ICD-10-CM | POA: Diagnosis not present

## 2020-10-08 DIAGNOSIS — I480 Paroxysmal atrial fibrillation: Secondary | ICD-10-CM | POA: Diagnosis not present

## 2020-10-08 DIAGNOSIS — I10 Essential (primary) hypertension: Secondary | ICD-10-CM | POA: Diagnosis not present

## 2020-10-08 LAB — CBC
HCT: 32.2 % — ABNORMAL LOW (ref 36.0–46.0)
Hemoglobin: 11.4 g/dL — ABNORMAL LOW (ref 12.0–15.0)
MCH: 33.3 pg (ref 26.0–34.0)
MCHC: 35.4 g/dL (ref 30.0–36.0)
MCV: 94.2 fL (ref 80.0–100.0)
Platelets: 262 10*3/uL (ref 150–400)
RBC: 3.42 MIL/uL — ABNORMAL LOW (ref 3.87–5.11)
RDW: 13.1 % (ref 11.5–15.5)
WBC: 7.4 10*3/uL (ref 4.0–10.5)
nRBC: 0 % (ref 0.0–0.2)

## 2020-10-08 LAB — BASIC METABOLIC PANEL
Anion gap: 13 (ref 5–15)
BUN: 12 mg/dL (ref 8–23)
CO2: 18 mmol/L — ABNORMAL LOW (ref 22–32)
Calcium: 9.3 mg/dL (ref 8.9–10.3)
Chloride: 109 mmol/L (ref 98–111)
Creatinine, Ser: 1.39 mg/dL — ABNORMAL HIGH (ref 0.44–1.00)
GFR, Estimated: 40 mL/min — ABNORMAL LOW (ref >60)
Glucose, Bld: 137 mg/dL — ABNORMAL HIGH (ref 70–99)
Potassium: 3.5 mmol/L (ref 3.5–5.1)
Sodium: 140 mmol/L (ref 135–145)

## 2020-10-08 LAB — GLUCOSE, CAPILLARY
Glucose-Capillary: 129 mg/dL — ABNORMAL HIGH (ref 70–99)
Glucose-Capillary: 141 mg/dL — ABNORMAL HIGH (ref 70–99)
Glucose-Capillary: 88 mg/dL (ref 70–99)
Glucose-Capillary: 99 mg/dL (ref 70–99)

## 2020-10-08 MED ORDER — SENNA 8.6 MG PO TABS
1.0000 | ORAL_TABLET | Freq: Every day | ORAL | Status: DC
  Administered 2020-10-08 – 2020-10-12 (×5): 8.6 mg via ORAL
  Filled 2020-10-08 (×5): qty 1

## 2020-10-08 NOTE — Progress Notes (Signed)
Physical Therapy Treatment Patient Details Name: Erin Yates MRN: [id number] DOB: Oct 28, 1946 Today's Date: 10/08/2020    History of Present Illness 74 y.o. female admitted on 10/02/20 for L sided weakness.  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  Pt with significant PMH of previous stroke with residual visual deficits (peripheral vision loss), but they do not hinder her function,PFO s/p closure, HTN, DM, anxiety, anemia, loop recorder placement.      PT Comments    Pt is making progress towards her goals as she was able to ambulate ~120 ft with a RW with minAx1 this date. Pt continues to display L side visual neglect as she tends to bump objects with the L side of her RW and place her L foot lateral to the RW during turns, placing her at risk for falls. She demonstrates poor feet clearance along with decreased gait speed and step length, which the pt had to concentrate greatly on to improve when cued. Will continue to follow acutely. Current recommendations remain appropriate.   Follow Up Recommendations  CIR;Supervision for mobility/OOB     Equipment Recommendations  None recommended by PT    Recommendations for Other Services       Precautions / Restrictions Precautions Precautions: Fall Precaution Comments: RN cleared pt for session Restrictions Weight Bearing Restrictions: No    Mobility  Bed Mobility Overal bed mobility: Needs Assistance Bed Mobility: Supine to Sit;Sit to Supine     Supine to sit: Supervision;HOB elevated Sit to supine: Supervision;HOB elevated   General bed mobility comments: supervision for safety   Transfers Overall transfer level: Needs assistance Equipment used: Rolling walker (2 wheeled) Transfers: Sit to/from Stand Sit to Stand: Min guard Stand pivot transfers: Min assist       General transfer comment: Min guard for safety, cues for proper hand placement.  Ambulation/Gait Ambulation/Gait  assistance: Min assist Gait Distance (Feet): 120 Feet Assistive device: Rolling walker (2 wheeled) Gait Pattern/deviations: Step-through pattern;Decreased stride length;Decreased step length - left;Narrow base of support;Trunk flexed;Drifts right/left;Decreased dorsiflexion - right;Decreased dorsiflexion - left Gait velocity: decreased significantly Gait velocity interpretation: <1.31 ft/sec, indicative of household ambulator General Gait Details: Intermittent LOB resulting in minA to maintain safety. Pt tends to stay more proximal to R side of RW. When turning pt steps L foot laterally outside RW and requires repeated extensive cues to acknowledge it and correct. Intermittent bouts of hitting obstacles with RW on L side. Pt tried to leave RW 3x during session and required cues to keep it with her for safety. Verbal and visual cues for heel-to-toe and inc step length.   Stairs             Wheelchair Mobility    Modified Rankin (Stroke Patients Only) Modified Rankin (Stroke Patients Only) Pre-Morbid Rankin Score: Slight disability Modified Rankin: Moderately severe disability     Balance Overall balance assessment: Needs assistance Sitting-balance support: Feet unsupported;No upper extremity supported Sitting balance-Leahy Scale: Fair Sitting balance - Comments: able to maintain static sitting with supervision    Standing balance support: Bilateral upper extremity supported;During functional activity Standing balance-Leahy Scale: Poor Standing balance comment: able to maintain static standing with min guard assist                             Cognition Arousal/Alertness: Awake/alert Behavior During Therapy: WFL for tasks assessed/performed Overall Cognitive Status: Impaired/Different from baseline Area of  Impairment: Attention;Following commands;Safety/judgement;Awareness;Problem solving                   Current Attention Level: Sustained   Following  Commands: Follows multi-step commands inconsistently;Follows one step commands consistently;Follows one step commands with increased time;Follows multi-step commands with increased time Safety/Judgement: Decreased awareness of safety;Decreased awareness of deficits Awareness: Emergent Problem Solving: Slow processing;Difficulty sequencing;Requires verbal cues General Comments: Pt unaware of L leg being placed lateral to RW during turns and requires extensive cues to gain attention to L leg and correct. Pt bumps obstacles with L and attempts to leave RW multiple times during session and requires cues to maintain safety.      Exercises      General Comments        Pertinent Vitals/Pain Pain Assessment: 0-10 Pain Score: 9  Pain Location: headache Pain Descriptors / Indicators: Grimacing;Throbbing Pain Intervention(s): Limited activity within patient's tolerance;Monitored during session;Repositioned;Premedicated before session    Home Living                      Prior Function            PT Goals (current goals can now be found in the care plan section) Acute Rehab PT Goals Patient Stated Goal: To improve and go to CIR PT Goal Formulation: With patient Time For Goal Achievement: 10/17/20 Potential to Achieve Goals: Good Progress towards PT goals: Progressing toward goals    Frequency    Min 4X/week      PT Plan Current plan remains appropriate    Co-evaluation              AM-PAC PT "6 Clicks" Mobility   Outcome Measure  Help needed turning from your back to your side while in a flat bed without using bedrails?: A Little Help needed moving from lying on your back to sitting on the side of a flat bed without using bedrails?: A Little Help needed moving to and from a bed to a chair (including a wheelchair)?: A Little Help needed standing up from a chair using your arms (e.g., wheelchair or bedside chair)?: A Little Help needed to walk in hospital room?:  A Little Help needed climbing 3-5 steps with a railing? : A Lot 6 Click Score: 17    End of Session Equipment Utilized During Treatment: Gait belt Activity Tolerance: Patient tolerated treatment well Patient left: in bed;with call bell/phone within reach;with bed alarm set Nurse Communication: Mobility status;Patient requests pain meds PT Visit Diagnosis: Muscle weakness (generalized) (M62.81);Difficulty in walking, not elsewhere classified (R26.2);Hemiplegia and hemiparesis;Unsteadiness on feet (R26.81);Other abnormalities of gait and mobility (R26.89);Other symptoms and signs involving the nervous system (R29.898) Hemiplegia - Right/Left: Left Hemiplegia - dominant/non-dominant: Non-dominant Hemiplegia - caused by: Cerebral infarction     Time: 0737-1062 PT Time Calculation (min) (ACUTE ONLY): 21 min  Charges:  $Gait Training: 8-22 mins                     Moishe Spice, PT, DPT Acute Rehabilitation Services  Pager: [phone] Office: [place] 10/08/2020, 2:19 PM

## 2020-10-08 NOTE — Progress Notes (Signed)
STROKE TEAM PROGRESS NOTE   INTERVAL HISTORY No family at bedside.  Patient initially sleeping, then woke up with me and PT at bedside.  She walked with PT in the hallway with walker and belt. PT/OT recommend CIR.  Pending bed availability.  OBJECTIVE Vitals:   10/08/20 0009 10/08/20 0401 10/08/20 0755 10/08/20 1148  BP: 121/78 (!) 142/65 (!) 160/76 136/64  Pulse: 78 63 67 66  Resp: 18 18 20 16   Temp: 97.6 F (36.4 C) 98.1 F (36.7 C) (!) 97.5 F (36.4 C) 98.1 F (36.7 C)  TempSrc:  Oral Oral Oral  SpO2: 96% 94% 98% 98%  Weight:      Height:       CBC:  Recent Labs  Lab 10/02/20 1903 10/02/20 1909 10/07/20 0029 10/08/20 0315  WBC 9.6   < > 8.6 7.4  NEUTROABS 6.0  --   --   --   HGB 11.7*   < > 11.2* 11.4*  HCT 36.6   < > 32.0* 32.2*  MCV 99.7   < > 94.1 94.2  PLT 227   < > 243 262   < > = values in this interval not displayed.   Basic Metabolic Panel:  Recent Labs  Lab 10/05/20 0657 10/06/20 0337 10/07/20 0029 10/08/20 0315  NA  --    < > 140 140  K  --    < > 3.9 3.5  CL  --    < > 112* 109  CO2  --    < > 16* 18*  GLUCOSE  --    < > 127* 137*  BUN  --    < > 14 12  CREATININE  --    < > 1.35* 1.39*  CALCIUM  --    < > 8.9 9.3  MG 1.0*  --  2.5*  --   PHOS 3.3  --   --   --    < > = values in this interval not displayed.   Lipid Panel:     Component Value Date/Time   CHOL 115 10/03/2020 0105   TRIG 442 (H) 10/03/2020 0105   HDL 35 (L) 10/03/2020 0105   CHOLHDL 3.3 10/03/2020 0105   VLDL UNABLE TO CALCULATE IF TRIGLYCERIDE OVER 400 mg/dL 10/03/2020 0105   LDLCALC UNABLE TO CALCULATE IF TRIGLYCERIDE OVER 400 mg/dL 10/03/2020 0105   HgbA1c:  Lab Results  Component Value Date   HGBA1C 6.2 (H) 10/03/2020   Urine Drug Screen: No results found for: LABOPIA, COCAINSCRNUR, LABBENZ, AMPHETMU, THCU, LABBARB  Alcohol Level     Component Value Date/Time   ETH <10 10/03/2020 0105    IMAGING  CT HEAD CODE STROKE WO CONTRAST 10/02/2020 1. No acute  infarct or intracranial hemorrhage identified.  2. ASPECTS is 10.  3. Hyperdense right MCA which will be evaluated on pending CTA.  4. Multiple chronic infarcts.   CT Code Stroke CTA Head W/WO contrast CT Code Stroke CTA Neck W/WO contrast 10/02/2020 1. Emergent proximal right M2 occlusion.  2. Mild carotid and vertebral atherosclerosis without significant stenosis.  3.  Aortic Atherosclerosis (ICD10-I70.0).   Cerebral Angio / IR 10/02/2020 R M2 occlusion (TICI0). 1st pass-Trevo 25mm & CAT 6 w/ aspiration. 2nd pass-Trevo 73mm & CAT 5 w/ aspiration. 3rd pass-Solitaire 38mm & Zoom 71 w/ aspiration. Final TICI3.   Post IR CT 10/02/2020 Small volume SAH at the sylvian fissure and right perisylvian sulci         CT HEAD WO  CONTRAST 10/03/2020 Nondiagnostic evaluation due to patient intolerance and motion. The technologist was unable to perform a repeat exam due to safety Concern.  CT HEAD WO CONTRAST 10/04/2020  Severely limited study with findings consistent with an evolving posterior right MCA territory infarct with progressive edema. Thereis hyperdensity in the region of the right sylvian fissure, concerning for acute subarachnoid hemorrhage but suboptimally evaluated. There is sulcal effacement without substantial midline shift.  CT HEAD WO CONTRAST 10/07/2020 Large area of acute infarct right posterior MCA territory involving basal ganglia and temporoparietal lobe. Mild amount of subarachnoid hemorrhage in the right sylvian fissure unchanged from 10/04/2020. Multiple chronic cortical infarcts. Findings suggest recurrent cerebral emboli.   Transthoracic Echocardiogram  10/04/2020 1. Left ventricular ejection fraction, by estimation, is 60 to 65%. The left ventricle has normal function. The left ventricle has no regional wall motion abnormalities. 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 Left ventricular diastolic parameters are  indeterminate.  2. Right ventricular systolic function is normal. The right ventricular size is normal. Tricuspid regurgitation signal is inadequate for assessing PA pressure.  3. The mitral valve is degenerative. Mild mitral valve regurgitation. Mild mitral stenosis. 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  4. The aortic valve is tricuspid. Aortic valve regurgitation is not visualized. No aortic stenosis is present.  5. The inferior vena cava is normal in size with greater than 50% respiratory variability, suggesting right atrial pressure of 3 mmHg.   ECG - Sinus rhythm with Premature supraventricular complexes; Left anterior fascicular block; Minimal voltage criteria for LVH, may be normal variant ( R in aVL )   PHYSICAL EXAM   Temp:  [97.5 F (36.4 C)-98.1 F (36.7 C)] 98.1 F (36.7 C) (12/03 1148) Pulse Rate:  [56-78] 66 (12/03 1148) Resp:  [14-21] 16 (12/03 1148) BP: (121-163)/(64-107) 136/64 (12/03 1148) SpO2:  [88 %-100 %] 98 % (12/03 1148)  General - Well nourished, well developed, not in acute distress.  Ophthalmologic - fundi not visualized due to noncooperation.  Cardiovascular - regular rate and rhythm, not in A. fib  Neuro - awake, alert, orientated to place, time, age and people. No aphasia, following simple commands. Decreased visual acuity, with left lower quadrantanopsia. No gaze palsy but right gaze preference.  Facial symmetrical, tongue protrusion midline. Left UE 4/5 proximal with mild drift and 4-/5 distal with decreased finger grip, RUE at least 4+/5 proximal and distal. BLE 4/5. Sensation symmetrical subjectively, coordination intact b/l FTN and gait not tested.   ASSESSMENT/PLAN [name] is a 74 y.o. female with history of diabetes, hypothyroidism, ongoing tobacco use, HTN, HLD, hx of PFO closure, implantable loop, hypertension,  atrial fibrillation on eliquis as well as multiple previous strokes (residual visual deficits) who presents with left-sided weakness, a fall, rightward gaze, and confusion. She did not receive IV t-PA due to anticoagulation with Eliquis. 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 However, pt does have multiple stroke risk factors.   CT Head -  No acute infarct or intracranial hemorrhage identified. ASPECTS is 10. Hyperdense right MCA. Multiple chronic infarcts.   CTA H&N - Emergent proximal right M2 occlusion. Mild carotid and vertebral atherosclerosis without significant stenosis.  IR TICI3 of right M2 occlusion, Small volume SAH at the sylvian fissure and right perisylvian sulci    CT head 11/29 an evolving posterior right MCA territory infarct with progressive edema. There is hyperdensity in the region of the right sylvian fissure  MRI and MRA head - initially hold off due to agitation and noncooperation, now pt refused due to claustrophobia  Repeat CT 12/2 unchanged large right MCA stroke with small sylvian fissure SAH  2D Echo - EF 60-65.%. degenerative MV w/ echodensity base posterior leaflet c/w severe P3 prolapse  Lacey Jensen Virus 2 - negative  TG 442, LDL - 44  HgbA1c - 6.2  VTE prophylaxis - SCDs  Eliquis (apixaban) daily prior to admission, now on ASA 81mg .  Consider resume AC once SAH resolves. Consider to repeat CT in one week.  Therapy recommendations:  CIR  Disposition:  Pending  Medically ready for d/c to CIR - Friday insurance approved, no bed available today or anticipated over weekend. They will f/u on Monday  Hx stroke/TIA  09/2007 with RLE hp  11/2007 with LLE numbness  01/2008 probable stroke with RUE hp  07/2010 L occipital and R basal ganglia infarct  PFO diagnosed 2009, underwent  closure 04/2009 [facility] Jenne [facility]ane [facility]  02/2016 left hemianopia status post TPA.  MRI showed right parietal and temporal infarcts.  CT head and neck right P3 occlusion.  EF 60 to 65%.  TEE showed PFO closure device in place.  LDL 79 A1c 6.4.  Discharged with DAPT.  11/2019 admitted for aphasia.  MRI showed left parietal lobe infarct.  MRI high-grade stenosis left A1, moderate stenosis right P2. Loop recorder positive for A. fib at that time, put on Eliquis.  Follows with Dr. Leonie Man at [facility]  PAF->AF w/ RVR Tachypalpitation  Follow with Dr. [name] cardiology  Loop recorder showed subclinical A. Fib  On Eliquis PTA, compliant per husband  On recent cardiac event monitoring with Dr. [name]  Now on ASA 81  No AC at the time until [facility] resolves. Will repeat CT in one week  RVR during the night 11/30. Placed on amio gtt w/ metoprolol IV and po.   bolused IV amio as still w/ transient RVR -> now under control  On po Amino   Hold off metoprolol per cardiology given hx of bradycardia  Cardiology on board, appreciate help  Per Dr. [name], may be candidate for [facility] device. He will see pt in clinic to further discuss  Agitation/confusion/delirium/vascular dementia  Likely due to bilateral acute and chronic infarcts  On four-point restraint -> off now   Not able to get MRI  EKG no QT prolongation  On haldol and ativan PRN  On Seroquel 25 bid  Resume home wellbutrin, effexor  Restless 11/30 w/o sedation all night in AF RVR.   Resumed home xanax 0.5 bid prn  Symptoms improved  Hypertension Bradycardia  Home BP meds: Norvasc ; Cozaar  Current BP meds: off Cleviprex   SBP goal < 160   BP in the 150s  Resume Norvasc 10  Off metoprolol, resumed cozaar 50->100 . Long-term BP goal normotensive  Hyperlipidemia  Home Lipid lowering medication: Lipitor 80 mg daily   LDL 44, LDL goal < 70  Lipitor 80 -> crestor 40 (per Dr. [name])  Continue statin at  discharge  Diabetes  Home diabetic meds: metformin  Current diabetic meds: SSI   HgbA1c 6.2, goal < 7.0  CBG monitoring  PCP follow-up  Dysphagia  Failed bedside swallow screen due to coughing and water  Speech on board  Cleared for D2 Nectar thick liquids -> regular DM diet with thin  On IV fluid @ 40cc -> off now  Tobacco abuse  Current smoker  Smoking cessation counseling provided  Nicotine patch provided  Pt is willing to quit  Other Stroke Risk Factors  Advanced age  Hx PFO s/p closure  Other Active Problems CKD - stage 3b - Cre- 1.60->1.30->1.28->1.32->1.33->1.35->1.39  Aortic Atherosclerosis (ICD10-I70.0).   Elevated Trop 569->804 EKG ok.   Hypomagnesemia Mg 1.0 -> 2.5 - on po supplement  Hypokalemia K 3.1 ->3.9->3.5 - resolved  Hospital day # 6  Rosalin Hawking, MD PhD Stroke Neurology 10/08/2020 1:46 PM  To contact Stroke Continuity provider, please refer to [email or web address] After hours, contact General Neurology

## 2020-10-08 NOTE — Plan of Care (Signed)
  Problem: Education: Goal: Knowledge of disease or condition will improve Outcome: Progressing   Problem: Coping: Goal: Will verbalize positive feelings about self Outcome: Progressing   Problem: Nutrition: Goal: Dietary intake will improve Outcome: Progressing

## 2020-10-08 NOTE — Progress Notes (Signed)
Inpatient Rehabilitation Admissions Coordinator  I have received [facility] approval for Cir admit, but no bed available today or expected over the weekend. I spoke with spouse by phone and he is aware. I will prepare admit in case bed becomes unexpectedly available over the weekend. Otherwise I will follow up on Monday with her functional progress and bed availability.  Danne Baxter, RN, MSN Rehab Admissions Coordinator [phone] 10/08/2020 1:20 PM

## 2020-10-08 NOTE — Progress Notes (Signed)
  Speech Language Pathology Treatment: Dysphagia  Patient Details Name: Erin Yates MRN: [id number] DOB: [birth year] Today's Date: 10/08/2020 Time: 8315-1761 SLP Time Calculation (min) (ACUTE ONLY): 8 min  Assessment / Plan / Recommendation Clinical Impression  Pt observed with am meal. Masticating well. Min verbal reminders needed to follow bites with sips. No further SLP interventions needed, pt to continue current diet. Will sign off.   HPI HPI: 74 y.o. female admitted on 10/02/20 for L sided weakness.  CT scan revealed a R M2 occlusion and she underwent emergent thrombectomy in IR.  Pt with significant PMH of GERD, previous stroke with residual visual deficits (peripheral vision loss), but they do not hinder her function, PFO s/p closure, HTN, DM, anxiety, anemia, loop recorder placement. Pt was evaluated by SLP in 11/2019 with oropharyngeal swallow WFL but mild-moderate aphasia noted with receptive and expressive difficulties.      SLP Plan  All goals met       Recommendations  Diet recommendations: Regular;Thin liquid Liquids provided via: Cup;Straw Medication Administration: Whole meds with liquid Supervision: Staff to assist with self feeding;Full supervision/cueing for compensatory strategies Compensations: Minimize environmental distractions;Slow rate;Small sips/bites;Lingual sweep for clearance of pocketing;Monitor for anterior loss                SLP Visit Diagnosis: Dysphagia, unspecified (R13.10) Plan: All goals met       GO                , Katherene Ponto 10/08/2020, 8:33 AM

## 2020-10-08 NOTE — Progress Notes (Signed)
Occupational Therapy Treatment Patient Details Name: Erin Yates MRN: [id number] DOB: 1946/04/07 Today's Date: 10/08/2020    History of present illness 74 y.o. female admitted on 10/02/20 for L sided weakness.  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  Pt with significant PMH of previous stroke with residual visual deficits (peripheral vision loss), but they do not hinder her function,PFO s/p closure, HTN, DM, anxiety, anemia, loop recorder placement.     OT comments  OT treatment session with focus on L NMR, attention in minimal to moderately distracting environments, safety awareness, and patient/family education. Patient continues to require cues for sustained attention during functional tasks. Patient also demonstrates difficulty attending to functional tasks with increased external distraction including nursing staff entering/exiting room and the phone ringing presenting increased safety/fall risk. Patient would benefit from continued acute OT services to maximize safety and independence with self-care tasks. Continued recommendation for CIR placement post d/c.   Follow Up Recommendations  CIR    Equipment Recommendations  None recommended by OT    Recommendations for Other Services      Precautions / Restrictions Precautions Precautions: Fall Precaution Comments: new onset afib with RVR, RN okayed OOB Restrictions Weight Bearing Restrictions: No       Mobility Bed Mobility Overal bed mobility: Needs Assistance Bed Mobility: Supine to Sit     Supine to sit: Supervision     General bed mobility comments: supervision for safety   Transfers Overall transfer level: Needs assistance Equipment used: 1 person hand held assist Transfers: Sit to/from Stand;Stand Pivot Transfers Sit to Stand: Min guard Stand pivot transfers: Min assist       General transfer comment: Min guard for sit to stand from EOB x3 trials. Min A for SPT for  balance. Increased need for assistance with additional distractions in environment.     Balance Overall balance assessment: Needs assistance Sitting-balance support: Feet unsupported;No upper extremity supported Sitting balance-Leahy Scale: Fair Sitting balance - Comments: able to maintain static sitting with supervision    Standing balance support: No upper extremity supported Standing balance-Leahy Scale: Fair Standing balance comment: able to maintain static standing with min guard assist                            ADL either performed or assessed with clinical judgement   ADL                                               Vision   Additional Comments: Visual pursuits for treatment of L field cut. Patient with additional head turns. Often loses fixation on therapists finger.    Perception     Praxis      Cognition Arousal/Alertness: Awake/alert Behavior During Therapy: WFL for tasks assessed/performed Overall Cognitive Status: Impaired/Different from baseline Area of Impairment: Attention;Following commands;Safety/judgement;Awareness;Problem solving                   Current Attention Level: Sustained   Following Commands: Follows multi-step commands inconsistently;Follows multi-step commands with increased time;Follows one step commands with increased time Safety/Judgement: Decreased awareness of safety Awareness: Intellectual Problem Solving: Slow processing;Difficulty sequencing;Requires verbal cues;Requires tactile cues General Comments: Increased time to process verbal information. Patient is easily distracted.         Exercises  Shoulder Instructions       General Comments      Pertinent Vitals/ Pain       Pain Assessment: No/denies pain Pain Intervention(s): Monitored during session  Home Living                                          Prior Functioning/Environment               Frequency  Min 2X/week        Progress Toward Goals  OT Goals(current goals can now be found in the care plan section)  Progress towards OT goals: Progressing toward goals  Acute Rehab OT Goals Patient Stated Goal: to go home before her granddaughter leaves for [place] OT Goal Formulation: With patient Time For Goal Achievement: 10/18/20 Potential to Achieve Goals: Good ADL Goals Pt Will Perform Grooming: with supervision;standing Pt Will Perform Lower Body Bathing: with supervision;sit to/from stand Pt Will Perform Lower Body Dressing: with supervision;sit to/from stand Pt Will Transfer to Toilet: with supervision;ambulating Pt Will Perform Toileting - Clothing Manipulation and hygiene: with supervision;sit to/from stand Additional ADL Goal #1: Pt will sustain attention to ADL/functional task > 63min with no more than min cues.  Plan Discharge plan remains appropriate    Co-evaluation                 AM-PAC OT "6 Clicks" Daily Activity     Outcome Measure   Help from another person eating meals?: A Little Help from another person taking care of personal grooming?: A Little Help from another person toileting, which includes using toliet, bedpan, or urinal?: A Little Help from another person bathing (including washing, rinsing, drying)?: A Little Help from another person to put on and taking off regular upper body clothing?: A Little Help from another person to put on and taking off regular lower body clothing?: A Lot 6 Click Score: 17    End of Session Equipment Utilized During Treatment: Gait belt  OT Visit Diagnosis: Unsteadiness on feet (R26.81);Cognitive communication deficit (R41.841) Symptoms and signs involving cognitive functions: Cerebral infarction   Activity Tolerance Patient tolerated treatment well   Patient Left in bed;with call bell/phone within reach;with family/visitor present (Patient seated EOB for lunch. RN aware. )   Nurse Communication           Time: 7410-6269 OT Time Calculation (min): 23 min  Charges: OT General Charges $OT Visit: 1 Visit OT Treatments $Therapeutic Activity: 8-22 mins $Neuromuscular Re-education: 8-22 mins  Erin Cislo H. OTR/L Supplemental OT, Department of rehab services [phone]   Erin Yates H. 10/08/2020, 1:16 PM

## 2020-10-09 DIAGNOSIS — I639 Cerebral infarction, unspecified: Secondary | ICD-10-CM | POA: Diagnosis not present

## 2020-10-09 LAB — CBC
HCT: 33.7 % — ABNORMAL LOW (ref 36.0–46.0)
Hemoglobin: 11.2 g/dL — ABNORMAL LOW (ref 12.0–15.0)
MCH: 32.2 pg (ref 26.0–34.0)
MCHC: 33.2 g/dL (ref 30.0–36.0)
MCV: 96.8 fL (ref 80.0–100.0)
Platelets: 295 10*3/uL (ref 150–400)
RBC: 3.48 MIL/uL — ABNORMAL LOW (ref 3.87–5.11)
RDW: 13.1 % (ref 11.5–15.5)
WBC: 7.7 10*3/uL (ref 4.0–10.5)
nRBC: 0 % (ref 0.0–0.2)

## 2020-10-09 LAB — BASIC METABOLIC PANEL
Anion gap: 13 (ref 5–15)
BUN: 15 mg/dL (ref 8–23)
CO2: 20 mmol/L — ABNORMAL LOW (ref 22–32)
Calcium: 9.7 mg/dL (ref 8.9–10.3)
Chloride: 109 mmol/L (ref 98–111)
Creatinine, Ser: 1.53 mg/dL — ABNORMAL HIGH (ref 0.44–1.00)
GFR, Estimated: 35 mL/min — ABNORMAL LOW (ref >60)
Glucose, Bld: 151 mg/dL — ABNORMAL HIGH (ref 70–99)
Potassium: 3.8 mmol/L (ref 3.5–5.1)
Sodium: 142 mmol/L (ref 135–145)

## 2020-10-09 LAB — GLUCOSE, CAPILLARY
Glucose-Capillary: 121 mg/dL — ABNORMAL HIGH (ref 70–99)
Glucose-Capillary: 176 mg/dL — ABNORMAL HIGH (ref 70–99)
Glucose-Capillary: 72 mg/dL (ref 70–99)
Glucose-Capillary: 115 mg/dL — ABNORMAL HIGH (ref 70–99)

## 2020-10-09 NOTE — Progress Notes (Signed)
STROKE TEAM PROGRESS NOTE   INTERVAL HISTORY Patient is sitting up in bed.  She states she is doing better.  She has no complaints today.  Blood pressure adequately controlled.  Her nurse is at the bedside.  Lab work this morning is significant only for slight increase creatinine of 1.53.  Vital signs are stable Neurological exam is unchanged. OBJECTIVE Vitals:   10/08/20 1547 10/08/20 2354 10/09/20 0341 10/09/20 1213  BP: 130/84 117/76 126/63 128/64  Pulse: (!) 104 76 (!) 57 65  Resp: 20 18 18 18   Temp: (!) 97.5 F (36.4 C) 97.7 F (36.5 C) 97.8 F (36.6 C) (!) 97.5 F (36.4 C)  TempSrc: Oral Oral Oral Oral  SpO2: 100% 98% 96% 100%  Weight:      Height:       CBC:  Recent Labs  Lab 10/02/20 1903 10/02/20 1909 10/08/20 0315 10/09/20 0155  WBC 9.6   < > 7.4 7.7  NEUTROABS 6.0  --   --   --   HGB 11.7*   < > 11.4* 11.2*  HCT 36.6   < > 32.2* 33.7*  MCV 99.7   < > 94.2 96.8  PLT 227   < > 262 295   < > = values in this interval not displayed.   Basic Metabolic Panel:  Recent Labs  Lab 10/05/20 0657 10/06/20 0337 10/07/20 0029 10/07/20 0029 10/08/20 0315 10/09/20 0155  NA  --    < > 140   < > 140 142  K  --    < > 3.9   < > 3.5 3.8  CL  --    < > 112*   < > 109 109  CO2  --    < > 16*   < > 18* 20*  GLUCOSE  --    < > 127*   < > 137* 151*  BUN  --    < > 14   < > 12 15  CREATININE  --    < > 1.35*   < > 1.39* 1.53*  CALCIUM  --    < > 8.9   < > 9.3 9.7  MG 1.0*  --  2.5*  --   --   --   PHOS 3.3  --   --   --   --   --    < > = values in this interval not displayed.   Lipid Panel:     Component Value Date/Time   CHOL 115 10/03/2020 0105   TRIG 442 (H) 10/03/2020 0105   HDL 35 (L) 10/03/2020 0105   CHOLHDL 3.3 10/03/2020 0105   VLDL UNABLE TO CALCULATE IF TRIGLYCERIDE OVER 400 mg/dL 10/03/2020 0105   LDLCALC UNABLE TO CALCULATE IF TRIGLYCERIDE OVER 400 mg/dL 10/03/2020 0105   HgbA1c:  Lab Results  Component Value Date   HGBA1C 6.2 (H) 10/03/2020    Urine Drug Screen: No results found for: LABOPIA, COCAINSCRNUR, LABBENZ, AMPHETMU, THCU, LABBARB  Alcohol Level     Component Value Date/Time   ETH <10 10/03/2020 0105    IMAGING  CT HEAD CODE STROKE WO CONTRAST 10/02/2020 1. No acute infarct or intracranial hemorrhage identified.  2. ASPECTS is 10.  3. Hyperdense right MCA which will be evaluated on pending CTA.  4. Multiple chronic infarcts.   CT Code Stroke CTA Head W/WO contrast CT Code Stroke CTA Neck W/WO contrast 10/02/2020 1. Emergent proximal right M2 occlusion.  2. Mild carotid and vertebral atherosclerosis  without significant stenosis.  3.  Aortic Atherosclerosis (ICD10-I70.0).   Cerebral Angio / IR 10/02/2020 R M2 occlusion (TICI0). 1st pass-Trevo 33mm & CAT 6 w/ aspiration. 2nd pass-Trevo 82mm & CAT 5 w/ aspiration. 3rd pass-Solitaire 59mm & Zoom 71 w/ aspiration. Final TICI3.   Post IR CT 10/02/2020 Small volume SAH at the sylvian fissure and right perisylvian sulci         CT HEAD WO CONTRAST 10/03/2020 Nondiagnostic evaluation due to patient intolerance and motion. The technologist was unable to perform a repeat exam due to safety Concern.  CT HEAD WO CONTRAST 10/04/2020  Severely limited study with findings consistent with an evolving posterior right MCA territory infarct with progressive edema. Thereis hyperdensity in the region of the right sylvian fissure, concerning for acute subarachnoid hemorrhage but suboptimally evaluated. There is sulcal effacement without substantial midline shift.  CT HEAD WO CONTRAST 10/07/2020 Large area of acute infarct right posterior MCA territory involving basal ganglia and temporoparietal lobe. Mild amount of subarachnoid hemorrhage in the right sylvian fissure unchanged from 10/04/2020. Multiple chronic cortical infarcts. Findings suggest recurrent cerebral emboli.   Transthoracic Echocardiogram  10/04/2020 1. Left ventricular ejection fraction, by estimation, is 60 to  65%. The left ventricle has normal function. The left ventricle has no regional wall motion abnormalities. 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 Left ventricular diastolic parameters are indeterminate.  2. Right ventricular systolic function is normal. The right ventricular size is normal. Tricuspid regurgitation signal is inadequate for assessing PA pressure.  3. The mitral valve is degenerative. Mild mitral valve regurgitation. Mild mitral stenosis. 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  4. The aortic valve is tricuspid. Aortic valve regurgitation is not visualized. No aortic stenosis is present.  5. The inferior vena cava is normal in size with greater than 50% respiratory variability, suggesting right atrial pressure of 3 mmHg.   ECG - Sinus rhythm with Premature supraventricular complexes; Left anterior fascicular block; Minimal voltage criteria for LVH, may be normal variant ( R in aVL )   PHYSICAL EXAM    Temp:  [97.5 F (36.4 C)-97.8 F (36.6 C)] 97.5 F (36.4 C) (12/04 1213) Pulse Rate:  [57-104] 65 (12/04 1213) Resp:  [18-20] 18 (12/04 1213) BP: (117-130)/(63-84) 128/64 (12/04 1213) SpO2:  [96 %-100 %] 100 % (12/04 1213)  General - Well nourished, well developed, not in acute distress.  Ophthalmologic - fundi not visualized due to noncooperation.  Cardiovascular - regular rate and rhythm, not in A. fib  Neuro - awake, alert, oriented to place, time, age and people. No aphasia, following simple commands. Decreased visual acuity, with left lower quadrantanopsia. No gaze palsy but right gaze preference.  Facial symmetrical, tongue protrusion midline. Left UE 4/5 proximal with mild drift and 4-/5 distal with decreased finger grip, RUE at least 4+/5 proximal and distal. BLE 4/5. Sensation symmetrical subjectively,  coordination intact b/l FTN and gait not tested.   ASSESSMENT/PLAN Ms. Aritza Brunet is a 74 y.o. female with history of diabetes, hypothyroidism, ongoing tobacco use, HTN, HLD, hx of PFO closure, implantable loop, hypertension, atrial fibrillation on eliquis as well as multiple previous strokes (residual visual deficits) who presents with left-sided weakness, a fall, rightward gaze, and confusion. She did not receive IV t-PA due to anticoagulation with Eliquis. 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 However, pt does have multiple stroke risk factors.   CT Head -  No acute infarct or intracranial hemorrhage identified. ASPECTS is 10. Hyperdense right MCA. Multiple chronic infarcts.   CTA H&N - Emergent proximal right M2 occlusion. Mild carotid and vertebral atherosclerosis without significant stenosis.   IR TICI3 of right M2 occlusion, Small volume SAH at the sylvian fissure and right perisylvian sulci    CT head 11/29 an evolving posterior right MCA territory infarct with progressive edema. There is hyperdensity in the region of the right sylvian fissure  MRI and MRA head - initially hold off due to agitation and noncooperation, now pt refused due to claustrophobia  Repeat CT 12/2 unchanged large right MCA stroke with small sylvian fissure SAH  2D Echo - EF 60-65.%. degenerative MV w/ echodensity base posterior leaflet c/w severe P3 prolapse  Lacey Jensen Virus 2 - negative  TG 442, LDL - 44  HgbA1c - 6.2  VTE prophylaxis - SCDs  Eliquis (apixaban) daily prior to admission, now on ASA 81mg .  Consider resume AC once SAH resolves. Consider to repeat CT in one week.  Therapy recommendations:  CIR  Disposition:  Pending  Medically ready for d/c to CIR - Friday insurance approved, no bed available today or  anticipated over weekend. They will f/u on Monday  Hx stroke/TIA  09/2007 with RLE hp  11/2007 with LLE numbness  01/2008 probable stroke with RUE hp  07/2010 L occipital and R basal ganglia infarct  PFO diagnosed 2009, underwent closure 04/2009 by Dr. Jenne Pane at [facility]  02/2016 left hemianopia status post TPA.  MRI showed right parietal and temporal infarcts.  CT head and neck right P3 occlusion.  EF 60 to 65%.  TEE showed PFO closure device in place.  LDL 79 A1c 6.4.  Discharged with DAPT.  11/2019 admitted for aphasia.  MRI showed left parietal lobe infarct.  MRI high-grade stenosis left A1, moderate stenosis right P2. Loop recorder positive for A. fib at that time, put on Eliquis.  Follows with Dr. Leonie Man at [facility]  PAF->AF w/ RVR Tachypalpitation  Follow with Dr. [name] cardiology  Loop recorder showed subclinical A. Fib  On Eliquis PTA, compliant per husband  On recent cardiac event monitoring with Dr. [name]  Now on ASA 81  No AC at the time until [facility] resolves. Will repeat CT in one week  RVR during the night 11/30. Placed on amio gtt w/ metoprolol IV and po.   bolused IV amio as still w/ transient RVR -> now under control  On po Amino   Hold off metoprolol per cardiology given hx of bradycardia  Cardiology on board, appreciate help  Per Dr. [name], may be candidate for [facility] device. He will see pt in clinic to further discuss  Agitation/confusion/delirium/vascular dementia  Likely due to bilateral acute and chronic infarcts  On four-point restraint -> off now   Not able to get MRI  EKG no QT prolongation  On haldol and ativan PRN  On Seroquel 25 bid  Resume home wellbutrin, effexor  Restless 11/30 w/o sedation all night in AF RVR.   Resumed home xanax 0.5 bid prn  Symptoms improved  Hypertension Bradycardia  Home BP meds: Norvasc ; Cozaar  Current BP meds: off Cleviprex   SBP goal < 160   BP in the 150s  Resume  Norvasc 10  Off  metoprolol, resumed cozaar 50->100 . Long-term BP goal normotensive  Hyperlipidemia  Home Lipid lowering medication: Lipitor 80 mg daily   LDL 44, LDL goal < 70  Lipitor 80 -> crestor 40 (per Dr. [name])  Continue statin at discharge  Diabetes  Home diabetic meds: metformin  Current diabetic meds: SSI   HgbA1c 6.2, goal < 7.0  CBG monitoring  PCP follow-up  Dysphagia  Failed bedside swallow screen due to coughing and water  Speech on board  Cleared for D2 Nectar thick liquids -> regular DM diet with thin  On IV fluid @ 40cc -> off now  Tobacco abuse  Current smoker  Smoking cessation counseling provided  Nicotine patch provided  Pt is willing to quit  Other Stroke Risk Factors  Advanced age  Hx PFO s/p closure  Other Active Problems CKD - stage 3b - Cre- 1.60->1.30->1.28->1.32->1.33->1.35->1.39->1.53  Aortic Atherosclerosis (ICD10-I70.0).   Elevated Trop 569->804 EKG ok.   Hypomagnesemia Mg 1.0 -> 2.5 - on po supplement  Hypokalemia K 3.1 ->3.9->3.5 - resolved - 3.8  Hospital day # 7 Plan repeat CT scan of the head tomorrow morning and may consider resuming anticoagulation.  Hopefully transfer to rehab early next week.  Continue present care.  Greater than 50% time during this 25-minute visit was spent in counseling and coordination of care both stroke and discussion with care team and answering questions. Antony Contras, MD  To contact Stroke Continuity provider, please refer to [email or web address] After hours, contact General Neurology

## 2020-10-09 NOTE — Progress Notes (Signed)
   10/09/20 0147  Clinical Encounter Type  Visited With Health care provider  Visit Type Initial;Code   Chaplain responded to a code blue.  No family is present at this time. Spiritual care services available as needed.   Jeri Lager, Chaplain

## 2020-10-09 NOTE — Progress Notes (Signed)
Chaplain with note on this pt at [place] from [facility] - "responded to a code blue" - this pt did not experience a code blue this evening. Did attempt to reach chaplain multiple times on multiple different phone numbers as well as through the operator with no success.

## 2020-10-10 ENCOUNTER — Inpatient Hospital Stay (HOSPITAL_COMMUNITY): Payer: Medicare HMO

## 2020-10-10 DIAGNOSIS — I639 Cerebral infarction, unspecified: Secondary | ICD-10-CM | POA: Diagnosis not present

## 2020-10-10 LAB — BASIC METABOLIC PANEL
Anion gap: 10 (ref 5–15)
BUN: 20 mg/dL (ref 8–23)
CO2: 19 mmol/L — ABNORMAL LOW (ref 22–32)
Calcium: 9.6 mg/dL (ref 8.9–10.3)
Chloride: 110 mmol/L (ref 98–111)
Creatinine, Ser: 1.72 mg/dL — ABNORMAL HIGH (ref 0.44–1.00)
GFR, Estimated: 31 mL/min — ABNORMAL LOW (ref >60)
Glucose, Bld: 121 mg/dL — ABNORMAL HIGH (ref 70–99)
Potassium: 4.1 mmol/L (ref 3.5–5.1)
Sodium: 139 mmol/L (ref 135–145)

## 2020-10-10 LAB — CBC
HCT: 30.1 % — ABNORMAL LOW (ref 36.0–46.0)
Hemoglobin: 10 g/dL — ABNORMAL LOW (ref 12.0–15.0)
MCH: 32.2 pg (ref 26.0–34.0)
MCHC: 33.2 g/dL (ref 30.0–36.0)
MCV: 96.8 fL (ref 80.0–100.0)
Platelets: 242 10*3/uL (ref 150–400)
RBC: 3.11 MIL/uL — ABNORMAL LOW (ref 3.87–5.11)
RDW: 12.9 % (ref 11.5–15.5)
WBC: 6.8 10*3/uL (ref 4.0–10.5)
nRBC: 0 % (ref 0.0–0.2)

## 2020-10-10 LAB — GLUCOSE, CAPILLARY
Glucose-Capillary: 113 mg/dL — ABNORMAL HIGH (ref 70–99)
Glucose-Capillary: 194 mg/dL — ABNORMAL HIGH (ref 70–99)
Glucose-Capillary: 150 mg/dL — ABNORMAL HIGH (ref 70–99)
Glucose-Capillary: 133 mg/dL — ABNORMAL HIGH (ref 70–99)

## 2020-10-10 MED ORDER — ACETAMINOPHEN-CODEINE #3 300-30 MG PO TABS
2.0000 | ORAL_TABLET | Freq: Four times a day (QID) | ORAL | Status: DC | PRN
  Administered 2020-10-10: 2 via ORAL
  Filled 2020-10-10: qty 2

## 2020-10-10 MED ORDER — SODIUM CHLORIDE 0.9 % IV SOLN: INTRAVENOUS | Status: DC

## 2020-10-10 MED ORDER — APIXABAN 5 MG PO TABS
5.0000 mg | ORAL_TABLET | Freq: Two times a day (BID) | ORAL | Status: DC
  Administered 2020-10-10 – 2020-10-12 (×5): 5 mg via ORAL
  Filled 2020-10-10 (×5): qty 1

## 2020-10-10 NOTE — Progress Notes (Signed)
ANTICOAGULATION CONSULT NOTE - Initial Consult  Pharmacy Consult for apixaban Indication: atrial fibrillation  Allergies  Allergen Reactions  . Alendronate Other (See Comments)    Rash   . Penicillins Rash    Also swelling and itching  Did it involve swelling of the face/tongue/throat, SOB, or low BP? Yes Did it involve sudden or severe rash/hives, skin peeling, or any reaction on the inside of your mouth or nose? Yes Did you need to seek medical attention at a hospital or doctor's office? Yes When did it last happen?More than 15 years ago If all above answers are "NO", may proceed with cephalosporin use.     Patient Measurements: Height: 5\' 5"  (165.1 cm) Weight: 61.1 kg (134 lb 11.2 oz) IBW/kg (Calculated) : 57   Vital Signs: Temp: 97.7 F (36.5 C) (12/05 0751) Temp Source: Oral (12/05 0751) BP: 142/58 (12/05 0751) Pulse Rate: 57 (12/05 0751)  Labs: Recent Labs    10/08/20 0315 10/08/20 0315 10/09/20 0155 10/10/20 0128  HGB 11.4*   < > 11.2* 10.0*  HCT 32.2*  --  33.7* 30.1*  PLT 262  --  295 242  CREATININE 1.39*  --  1.53* 1.72*   < > = values in this interval not displayed.    Estimated Creatinine Clearance: 25.8 mL/min (A) (by C-G formula based on SCr of 1.72 mg/dL (H)).   Medical History: Past Medical History:  Diagnosis Date  . Anemia    past  hx  . Anxiety   . Depression   . Diabetes mellitus without complication ([place])   . Dysrhythmia    prior to PFO correction  . GERD (gastroesophageal reflux disease)   . Headache    before PFO closure  . Hypercholesteremia   . Hypertension   . Hypothyroidism   . 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 PFO (patent foramen ovale)    correction device placed 2010  . Right leg numbness    lower leg- residual from CVA  . Seasonal allergies   . Stroke [facility])    prior to PFO correction    Assessment: 74 yo female with h/o afib on apixaban PTA. Patient admitted for stroke with small SAH and  apixaban held. Patient is 9 days out from admission and MD wishes to restart apixaban. Age <80, weight >60kg and Scr >1.5. Patient still qualifies for 5mg  BID dose unless weight drops to <60 kg (currently 61.1kg).   Goal of Therapy:  Prevention of stroke Monitor platelets by anticoagulation protocol: Yes   Plan:  Restart home apixaban 5mg  PO BID F/u weight and Scr   Glynis Hunsucker A. Levada Dy, PharmD, BCPS, FNKF Clinical Pharmacist [place] Please utilize [employer] for appropriate phone number to reach the unit pharmacist ([place])   10/10/2020,8:44 AM

## 2020-10-10 NOTE — Progress Notes (Signed)
Pt back from CT, no s/s distress or discomfort. Resting calmly in bed.

## 2020-10-10 NOTE — Progress Notes (Signed)
Pt down to CT. No s/s distress or discomfort noted.

## 2020-10-10 NOTE — Progress Notes (Signed)
Patient Name: Erin Yates   Patient Profile:   Erin Yates a 74 y.o.femalewith a hx of prior strokes, DM, hypothyroidism, HTN, AFIB (Dr. [name] describes as SCAF), PFO (s/p closure amplazter 2010), known calcified MV lesion, smoking seen following recurrent stroke on Apixoban  And with AF and RVR  Thrombectomy and stroke complicated by small SAH and Apixoban  Held and now on asa    SUBJECTIVE speech getting better  Discouraged no sob or chest pain   Past Medical History:  Diagnosis Date  . Anemia    past  hx  . Anxiety   . Depression   . Diabetes mellitus without complication ([place])   . Dysrhythmia    prior to PFO correction  . GERD (gastroesophageal reflux disease)   . Headache    before PFO closure  . Hypercholesteremia   . Hypertension   . Hypothyroidism   . 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 PFO (patent foramen ovale)    correction device placed 2010  . Right leg numbness    lower leg- residual from CVA  . Seasonal allergies   . Stroke [facility])    prior to PFO correction    Scheduled Meds:  Scheduled Meds: .  stroke: mapping our early stages of recovery book   Does not apply Once  . amiodarone  400 mg Oral BID  . amLODipine  10 mg Oral QPM  . apixaban  5 mg Oral BID  . buPROPion  150 mg Oral BID  . insulin aspart  0-6 Units Subcutaneous TID WC & HS  . levothyroxine  50 mcg Oral QAC breakfast  . magnesium oxide  400 mg Oral Daily  . mouth rinse  15 mL Mouth Rinse BID  . nicotine  14 mg Transdermal Daily  . pantoprazole  40 mg Oral Daily  . QUEtiapine  25 mg Oral BID  . rosuvastatin  40 mg Oral Daily  . senna  1 tablet Oral Daily  . venlafaxine XR  150 mg Oral Q breakfast  . vitamin B-12  500 mcg Oral QPM   Continuous Infusions: . sodium chloride 50 mL/hr at 10/10/20 0955   acetaminophen **OR** acetaminophen (TYLENOL) oral liquid 160 mg/5 mL **OR** acetaminophen, ALPRAZolam, butalbital-acetaminophen-caffeine, haloperidol  lactate    PHYSICAL EXAM Vitals:   10/09/20 2321 10/10/20 0338 10/10/20 0751 10/10/20 1140  BP: (!) 112/53 (!) 125/53 (!) 142/58 136/67  Pulse: (!) 57 (!) 57 (!) 57 63  Resp: 18 19 18 18   Temp: 97.8 F (36.6 C) 98.6 F (37 C) 97.7 F (36.5 C) (!) 97.2 F (36.2 C)  TempSrc: Oral Oral Oral Oral  SpO2: 97% 98% 90% 99%  Weight:      Height:       Well developed and nourished in no acute distress HENT normal Neck supple with JVP Clear Regular rate and rhythm, no murmurs or gallops Abd-soft with active BS No Clubbing cyanosis edema Skin-warm and dry A & Oriented  Grossly normal sensory and motor function     TELEMETRY: Reviewed personnally pt in sinus without afib      Intake/Output Summary (Last 24 hours) at 10/10/2020 1149 Last data filed at 10/09/2020 1200 Gross per 24 hour  Intake 240 ml  Output --  Net 240 ml    LABS: Basic Metabolic Panel: Recent Labs  Lab 10/04/20 0118 10/04/20 0118 10/05/20 0021 10/05/20 0021 10/05/20 6270 10/06/20 3500 10/06/20 0337 10/07/20  6720 10/07/20 0029 10/08/20 0315 10/08/20 0315 10/09/20 0155 10/10/20 0128  NA 139  --  140  --   --  139  --  140  --  140  --  142 139  K 3.7  --  3.9  --   --  3.1*  --  3.9  --  3.5  --  3.8 4.1  CL 109  --  108  --   --  108  --  112*  --  109  --  109 110  CO2 17*  --  17*  --   --  18*  --  16*  --  18*  --  20* 19*  GLUCOSE 99  --  140*  --   --  115*  --  127*  --  137*  --  151* 121*  BUN 11  --  11  --   --  13  --  14  --  12  --  15 20  CREATININE 1.28*  --  1.32*  --   --  1.33*  --  1.35*  --  1.39*  --  1.53* 1.72*  CALCIUM 8.8*   < > 8.9   < >  --  8.4*   < > 8.9   < > 9.3   < > 9.7 9.6  MG  --   --   --   --  1.0*  --   --  2.5*  --   --   --   --   --   PHOS  --   --   --   --  3.3  --   --   --   --   --   --   --   --    < > = values in this interval not displayed.   Cardiac Enzymes: No results for input(s): CKTOTAL, CKMB, CKMBINDEX, TROPONINI in the last 72  hours. CBC: Recent Labs  Lab 10/04/20 0118 10/05/20 0021 10/06/20 0337 10/07/20 0029 10/08/20 0315 10/09/20 0155 10/10/20 0128  WBC 11.5* 13.2* 8.4 8.6 7.4 7.7 6.8  HGB 10.3* 11.1* 10.4* 11.2* 11.4* 11.2* 10.0*  HCT 30.5* 32.6* 30.5* 32.0* 32.2* 33.7* 30.1*  MCV 94.7 93.9 94.4 94.1 94.2 96.8 96.8  PLT 232 255 228 243 262 295 242   PROTIME: No results for input(s): LABPROT, INR in the last 72 hours. Liver Function Tests: No results for input(s): AST, ALT, ALKPHOS, BILITOT, PROT, ALBUMIN in the last 72 hours. No results for input(s): LIPASE, AMYLASE in the last 72 hours. BNP: BNP (last 3 results) No results for input(s): BNP in the last 8760 hours.  ProBNP (last 3 results) No results for input(s): PROBNP in the last 8760 hours.  D-Dimer: No results for input(s): DDIMER in the last 72 hours. Hemoglobin A1C: No results for input(s): HGBA1C in the last 72 hours. Fasting Lipid Panel: No results for input(s): CHOL, HDL, LDLCALC, TRIG, CHOLHDL, LDLDIRECT in the last 72 hours. Thyroid Function Tests: No results for input(s): TSH, T4TOTAL, T3FREE, THYROIDAB in the last 72 hours.  Invalid input(s): FREET3 Anemia Panel: No results for input(s): VITAMINB12, FOLATE, FERRITIN, TIBC, IRON, RETICCTPCT in the last 72 hours.     ASSESSMENT AND PLAN:  Active Problems:   Stroke (cerebrum) ([place]))   Controlled type 2 diabetes mellitus with hyperglycemia (HCC)   Essential hypertension   Atrial fibrillation (HCC)   Dysphagia, post-stroke   Hypokalemia  Continue amio and crestor Continue  mgOxide  Will discuss with Dr CL in am re Watchman Left Atrial Appendage Occluder    Signed, Virl Axe MD  10/10/2020

## 2020-10-10 NOTE — Progress Notes (Signed)
STROKE TEAM PROGRESS NOTE   INTERVAL HISTORY Her husband is at the bedside.  Patient is sitting up in bed.  She looks comfortable.  She feels she is improving and would like to go home.  She has no complaints.  Vital signs stable.  Neurological exam is unchanged.  Serum creatinine is increased to 1.73 today.  CT scan of the head showed stable appearance of right MCA infarct and small right sylvian fissure hemorrhage.   OBJECTIVE Vitals:   10/09/20 2321 10/10/20 0338 10/10/20 0751 10/10/20 1140  BP: (!) 112/53 (!) 125/53 (!) 142/58 136/67  Pulse: (!) 57 (!) 57 (!) 57 63  Resp: 18 19 18 18   Temp: 97.8 F (36.6 C) 98.6 F (37 C) 97.7 F (36.5 C) (!) 97.2 F (36.2 C)  TempSrc: Oral Oral Oral Oral  SpO2: 97% 98% 90% 99%  Weight:      Height:       CBC:  Recent Labs  Lab 10/09/20 0155 10/10/20 0128  WBC 7.7 6.8  HGB 11.2* 10.0*  HCT 33.7* 30.1*  MCV 96.8 96.8  PLT 295 378   Basic Metabolic Panel:  Recent Labs  Lab 10/05/20 0657 10/06/20 0337 10/07/20 0029 10/08/20 0315 10/09/20 0155 10/10/20 0128  NA  --    < > 140   < > 142 139  K  --    < > 3.9   < > 3.8 4.1  CL  --    < > 112*   < > 109 110  CO2  --    < > 16*   < > 20* 19*  GLUCOSE  --    < > 127*   < > 151* 121*  BUN  --    < > 14   < > 15 20  CREATININE  --    < > 1.35*   < > 1.53* 1.72*  CALCIUM  --    < > 8.9   < > 9.7 9.6  MG 1.0*  --  2.5*  --   --   --   PHOS 3.3  --   --   --   --   --    < > = values in this interval not displayed.   Lipid Panel:     Component Value Date/Time   CHOL 115 10/03/2020 0105   TRIG 442 (H) 10/03/2020 0105   HDL 35 (L) 10/03/2020 0105   CHOLHDL 3.3 10/03/2020 0105   VLDL UNABLE TO CALCULATE IF TRIGLYCERIDE OVER 400 mg/dL 10/03/2020 0105   LDLCALC UNABLE TO CALCULATE IF TRIGLYCERIDE OVER 400 mg/dL 10/03/2020 0105   HgbA1c:  Lab Results  Component Value Date   HGBA1C 6.2 (H) 10/03/2020   Urine Drug Screen: No results found for: LABOPIA, COCAINSCRNUR, LABBENZ,  AMPHETMU, THCU, LABBARB  Alcohol Level     Component Value Date/Time   ETH <10 10/03/2020 0105    IMAGING  CT HEAD CODE STROKE WO CONTRAST 10/02/2020 1. No acute infarct or intracranial hemorrhage identified.  2. ASPECTS is 10.  3. Hyperdense right MCA which will be evaluated on pending CTA.  4. Multiple chronic infarcts.   CT Code Stroke CTA Head W/WO contrast CT Code Stroke CTA Neck W/WO contrast 10/02/2020 1. Emergent proximal right M2 occlusion.  2. Mild carotid and vertebral atherosclerosis without significant stenosis.  3.  Aortic Atherosclerosis (ICD10-I70.0).   Cerebral Angio / IR 10/02/2020 R M2 occlusion (TICI0). 1st pass-Trevo 57mm & CAT 6 w/ aspiration. 2nd pass-Trevo 28mm &  CAT 5 w/ aspiration. 3rd pass-Solitaire 68mm & Zoom 71 w/ aspiration. Final TICI3.   Post IR CT 10/02/2020 Small volume SAH at the sylvian fissure and right perisylvian sulci         CT HEAD WO CONTRAST 10/03/2020 Nondiagnostic evaluation due to patient intolerance and motion. The technologist was unable to perform a repeat exam due to safety Concern.  CT HEAD WO CONTRAST 10/04/2020  Severely limited study with findings consistent with an evolving posterior right MCA territory infarct with progressive edema. Thereis hyperdensity in the region of the right sylvian fissure, concerning for acute subarachnoid hemorrhage but suboptimally evaluated. There is sulcal effacement without substantial midline shift.  CT HEAD WO CONTRAST 10/07/2020 Large area of acute infarct right posterior MCA territory involving basal ganglia and temporoparietal lobe. Mild amount of subarachnoid hemorrhage in the right sylvian fissure unchanged from 10/04/2020. Multiple chronic cortical infarcts. Findings suggest recurrent cerebral emboli.   Transthoracic Echocardiogram  10/04/2020 1. Left ventricular ejection fraction, by estimation, is 60 to 65%. The left ventricle has normal function. The left ventricle has no  regional wall motion abnormalities. 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 Left ventricular diastolic parameters are indeterminate.  2. Right ventricular systolic function is normal. The right ventricular size is normal. Tricuspid regurgitation signal is inadequate for assessing PA pressure.  3. The mitral valve is degenerative. Mild mitral valve regurgitation. Mild mitral stenosis. 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  4. The aortic valve is tricuspid. Aortic valve regurgitation is not visualized. No aortic stenosis is present.  5. The inferior vena cava is normal in size with greater than 50% respiratory variability, suggesting right atrial pressure of 3 mmHg.   ECG - Sinus rhythm with Premature supraventricular complexes; Left anterior fascicular block; Minimal voltage criteria for LVH, may be normal variant ( R in aVL )   PHYSICAL EXAM    Temp:  [97.2 F (36.2 C)-98.6 F (37 C)] 97.2 F (36.2 C) (12/05 1140) Pulse Rate:  [57-66] 63 (12/05 1140) Resp:  [17-19] 18 (12/05 1140) BP: (112-154)/(53-72) 136/67 (12/05 1140) SpO2:  [90 %-100 %] 99 % (12/05 1140)  General - Well nourished, well developed, not in acute distress.  Ophthalmologic - fundi not visualized due to noncooperation.  Cardiovascular - regular rate and rhythm, not in A. fib  Neuro - awake, alert, oriented to place, time, age and people. No aphasia, following simple commands. Decreased visual acuity, with left lower quadrantanopsia. No gaze palsy but right gaze preference.  Facial symmetrical, tongue protrusion midline. Left UE 4/5 proximal with mild drift and 4-/5 distal with decreased finger grip, RUE at least 4+/5 proximal and distal. BLE 4/5. Sensation symmetrical subjectively, coordination intact b/l FTN and gait not tested.   ASSESSMENT/PLAN Ms.  Erin Yates is a 74 y.o. female with history of diabetes, hypothyroidism, ongoing tobacco use, HTN, HLD, hx of PFO closure, implantable loop, hypertension, atrial fibrillation on eliquis as well as multiple previous strokes (residual visual deficits) who presents with left-sided weakness, a fall, rightward gaze, and confusion. She did not receive IV t-PA due to anticoagulation with Eliquis. 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 However, pt does have multiple stroke risk  factors.   CT Head -  No acute infarct or intracranial hemorrhage identified. ASPECTS is 10. Hyperdense right MCA. Multiple chronic infarcts.   CTA H&N - Emergent proximal right M2 occlusion. Mild carotid and vertebral atherosclerosis without significant stenosis.   IR TICI3 of right M2 occlusion, Small volume SAH at the sylvian fissure and right perisylvian sulci    CT head 11/29 an evolving posterior right MCA territory infarct with progressive edema. There is hyperdensity in the region of the right sylvian fissure  MRI and MRA head - initially hold off due to agitation and noncooperation, now pt refused due to claustrophobia  Repeat CT 12/2 unchanged large right MCA stroke with small sylvian fissure SAH  2D Echo - EF 60-65.%. degenerative MV w/ echodensity base posterior leaflet c/w severe P3 prolapse  Lacey Jensen Virus 2 - negative  TG 442, LDL - 44  HgbA1c - 6.2  VTE prophylaxis - SCDs  Eliquis (apixaban) daily prior to admission, now on ASA 81mg .  Consider resume AC once SAH resolves. Consider to repeat CT in one week.  Therapy recommendations:  CIR  Disposition:  Pending  Medically ready for d/c to CIR - Friday insurance approved, no bed available today or anticipated over weekend. They will f/u on Monday  Hx stroke/TIA  09/2007 with  RLE hp  11/2007 with LLE numbness  01/2008 probable stroke with RUE hp  07/2010 L occipital and R basal ganglia infarct  PFO diagnosed 2009, underwent closure 04/2009 by Dr. Jenne Pane at [facility]  02/2016 left hemianopia status post TPA.  MRI showed right parietal and temporal infarcts.  CT head and neck right P3 occlusion.  EF 60 to 65%.  TEE showed PFO closure device in place.  LDL 79 A1c 6.4.  Discharged with DAPT.  11/2019 admitted for aphasia.  MRI showed left parietal lobe infarct.  MRI high-grade stenosis left A1, moderate stenosis right P2. Loop recorder positive for A. fib at that time, put on Eliquis.  Follows with Dr. Leonie Man at [facility]  PAF->AF w/ RVR Tachypalpitation  Follow with Dr. [name] cardiology  Loop recorder showed subclinical A. Fib  On Eliquis PTA, compliant per husband  On recent cardiac event monitoring with Dr. [name]  Now on ASA 81  No AC at the time until [facility] resolves. Will repeat CT in one week  RVR during the night 11/30. Placed on amio gtt w/ metoprolol IV and po.   bolused IV amio as still w/ transient RVR -> now under control  On po Amino   Hold off metoprolol per cardiology given hx of bradycardia  Cardiology on board, appreciate help  Per Dr. [name], may be candidate for [facility] device. He will see pt in clinic to further discuss  Agitation/confusion/delirium/vascular dementia  Likely due to bilateral acute and chronic infarcts  On four-point restraint -> off now   Not able to get MRI  EKG no QT prolongation  On haldol and ativan PRN  On Seroquel 25 bid  Resume home wellbutrin, effexor  Restless 11/30 w/o sedation all night in AF RVR.   Resumed home xanax 0.5 bid prn  Symptoms improved  Hypertension Bradycardia  Home BP meds: Norvasc ; Cozaar  Current BP meds: off Cleviprex   SBP goal < 160   BP in the 150s  Resume Norvasc 10  Off metoprolol, resumed cozaar 50->100 . Long-term BP goal  normotensive  Hyperlipidemia  Home Lipid lowering medication: Lipitor 80 mg daily   LDL 44, LDL goal <  70  Lipitor 80 -> crestor 40 (per Dr. [name])  Continue statin at discharge  Diabetes  Home diabetic meds: metformin  Current diabetic meds: SSI   HgbA1c 6.2, goal < 7.0  CBG monitoring  PCP follow-up  Dysphagia  Failed bedside swallow screen due to coughing and water  Speech on board  Cleared for D2 Nectar thick liquids -> regular DM diet with thin  On IV fluid @ 40cc -> off now  Tobacco abuse  Current smoker  Smoking cessation counseling provided  Nicotine patch provided  Pt is willing to quit  Other Stroke Risk Factors  Advanced age  Hx PFO s/p closure   Other Active Problems CKD - stage 3b - Cre- 1.60->1.30->1.28->1.32->1.33->1.35->1.39->1.53->1.72 (not sure what is causing creatinine to increase. Now on NS at 50 cc/hr. Cozaar DC'd 12/1 by Dr Erlinda Hong. Recheck in AM)  Aortic Atherosclerosis (ICD10-I70.0).   Elevated Trop 569->804 EKG ok.   Hypomagnesemia Mg 1.0 -> 2.5 - on po supplement - may need to DC (normal range 1.7 - 2.4) - recheck in AM  Hypokalemia K 3.1 ->3.9->3.5 - resolved - 3.8->4.1  Hgb - 11.2->10.0  Hospital day # 8 Plan DC losartan due to worsening kidney function.  IV hydration with normal saline 50 cc an hour.  Change aspirin to Eliquis for secondary stroke prevention as it is been 8 days since her stroke. Long discussion with patient and husband at the bedside and answered questions.  Await transfer to inpatient rehab over the next few days.  Greater than 50% time during this 25-minute visit was spent on counseling and coordination of care about her stroke and atrial fibrillation and discussion of risk benefits of anticoagulation and answering questions. Antony Contras, MD To contact Stroke Continuity provider, please refer to [email or web address] After hours, contact General Neurology

## 2020-10-11 LAB — BASIC METABOLIC PANEL
Anion gap: 7 (ref 5–15)
BUN: 22 mg/dL (ref 8–23)
CO2: 21 mmol/L — ABNORMAL LOW (ref 22–32)
Calcium: 9.3 mg/dL (ref 8.9–10.3)
Chloride: 112 mmol/L — ABNORMAL HIGH (ref 98–111)
Creatinine, Ser: 1.75 mg/dL — ABNORMAL HIGH (ref 0.44–1.00)
GFR, Estimated: 30 mL/min — ABNORMAL LOW (ref >60)
Glucose, Bld: 118 mg/dL — ABNORMAL HIGH (ref 70–99)
Potassium: 4.2 mmol/L (ref 3.5–5.1)
Sodium: 140 mmol/L (ref 135–145)

## 2020-10-11 LAB — CBC
HCT: 27.5 % — ABNORMAL LOW (ref 36.0–46.0)
Hemoglobin: 9.5 g/dL — ABNORMAL LOW (ref 12.0–15.0)
MCH: 33.2 pg (ref 26.0–34.0)
MCHC: 34.5 g/dL (ref 30.0–36.0)
MCV: 96.2 fL (ref 80.0–100.0)
Platelets: 256 10*3/uL (ref 150–400)
RBC: 2.86 MIL/uL — ABNORMAL LOW (ref 3.87–5.11)
RDW: 12.8 % (ref 11.5–15.5)
WBC: 6.1 10*3/uL (ref 4.0–10.5)
nRBC: 0 % (ref 0.0–0.2)

## 2020-10-11 LAB — GLUCOSE, CAPILLARY
Glucose-Capillary: 123 mg/dL — ABNORMAL HIGH (ref 70–99)
Glucose-Capillary: 128 mg/dL — ABNORMAL HIGH (ref 70–99)
Glucose-Capillary: 83 mg/dL (ref 70–99)
Glucose-Capillary: 190 mg/dL — ABNORMAL HIGH (ref 70–99)

## 2020-10-11 LAB — MAGNESIUM: Magnesium: 1.7 mg/dL (ref 1.7–2.4)

## 2020-10-11 NOTE — Progress Notes (Signed)
Physical Therapy Treatment Patient Details Name: Erin Yates MRN: [id number] DOB: 01-Apr-1946 Today's Date: 10/11/2020    History of Present Illness 74 y.o. female admitted on 10/02/20 for L sided weakness.  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  Pt with significant PMH of previous stroke with residual visual deficits (peripheral vision loss), but they do not hinder her function,PFO s/p closure, HTN, DM, anxiety, anemia, loop recorder placement.      PT Comments    Pt progressing well towards all goals. Pt near baseline as pt with L sided neglect from previous stroke. Pt initially very guarded and cautious with ambulation but then progressed to min guard with RW with fluid gait pattern with shortened step length. Pt with 2 steps to enter home, and was able to navigate those safely today with PT. Spouse will be availabe 24/7 to provide assist. Pt safe to d/c home with HHPT, use of RW, and 24/7 assist once medically stable.   Follow Up Recommendations  Home health PT;Supervision/Assistance - 24 hour     Equipment Recommendations  Rolling walker with 5" wheels    Recommendations for Other Services       Precautions / Restrictions Precautions Precautions: Fall Restrictions Weight Bearing Restrictions: No    Mobility  Bed Mobility Overal bed mobility: Needs Assistance Bed Mobility: Supine to Sit     Supine to sit: Supervision;HOB elevated     General bed mobility comments: increased time, supervision for safety  Transfers Overall transfer level: Needs assistance Equipment used: Rolling walker (2 wheeled) Transfers: Sit to/from Stand Sit to Stand: Min guard         General transfer comment: min guard for safety, no physical assist needed, verbal cues for safe hand placement, min guard to steady during transition of hands from bed to walker  Ambulation/Gait Ambulation/Gait assistance: Min assist Gait Distance (Feet): 200  Feet Assistive device: Rolling walker (2 wheeled) Gait Pattern/deviations: Step-through pattern;Decreased stride length;Decreased step length - left;Narrow base of support;Trunk flexed;Drifts right/left;Decreased dorsiflexion - right;Decreased dorsiflexion - left Gait velocity: dec   General Gait Details: pt trialed with RW and HHA. Pt initially very ginger and cautious as if she was trying not to step on something. but then transitioned to a more fluid gait pattern, pt benefit from RW for more sequential fluid gait pattern, max directional verbal cues for navigation due to severe L sided neglect   Stairs Stairs: Yes Stairs assistance: Min assist Stair Management: One rail Right;Alternating pattern Number of Stairs: 2 General stair comments: steady, minA to steady   Wheelchair Mobility    Modified Rankin (Stroke Patients Only) Modified Rankin (Stroke Patients Only) Pre-Morbid Rankin Score: Slight disability Modified Rankin: Moderate disability     Balance Overall balance assessment: Needs assistance Sitting-balance support: Feet unsupported;No upper extremity supported Sitting balance-Leahy Scale: Fair Sitting balance - Comments: able to maintain static sitting with supervision    Standing balance support: Single extremity supported Standing balance-Leahy Scale: Fair Standing balance comment: pt with good static balance but requires physical assist for dynamic balance                            Cognition Arousal/Alertness: Awake/alert Behavior During Therapy: WFL for tasks assessed/performed Overall Cognitive Status: Impaired/Different from baseline Area of Impairment: Attention;Following commands;Safety/judgement;Awareness;Problem solving                   Current Attention  Level: Sustained Memory: Decreased short-term memory Following Commands: Follows multi-step commands with increased time Safety/Judgement: Decreased awareness of safety;Decreased  awareness of deficits Awareness: Emergent Problem Solving: Slow processing;Requires verbal cues;Requires tactile cues General Comments: pt with noted L sided neglect (residual from last stroke), slow to process but appropriate      Exercises      General Comments General comments (skin integrity, edema, etc.): VSS      Pertinent Vitals/Pain Pain Assessment: No/denies pain Pain Intervention(s): Monitored during session    Home Living                      Prior Function            PT Goals (current goals can now be found in the care plan section) Progress towards PT goals: Progressing toward goals    Frequency    Min 4X/week      PT Plan Discharge plan needs to be updated    Co-evaluation              AM-PAC PT "6 Clicks" Mobility   Outcome Measure  Help needed turning from your back to your side while in a flat bed without using bedrails?: A Little Help needed moving from lying on your back to sitting on the side of a flat bed without using bedrails?: A Little Help needed moving to and from a bed to a chair (including a wheelchair)?: A Little Help needed standing up from a chair using your arms (e.g., wheelchair or bedside chair)?: A Little Help needed to walk in hospital room?: A Little Help needed climbing 3-5 steps with a railing? : A Little 6 Click Score: 18    End of Session Equipment Utilized During Treatment: Gait belt Activity Tolerance: Patient tolerated treatment well Patient left: in bed;with call bell/phone within reach;with nursing/sitter in room;with family/visitor present (sitting EOB) Nurse Communication: Mobility status PT Visit Diagnosis: Muscle weakness (generalized) (M62.81);Difficulty in walking, not elsewhere classified (R26.2);Hemiplegia and hemiparesis;Unsteadiness on feet (R26.81);Other abnormalities of gait and mobility (R26.89);Other symptoms and signs involving the nervous system (R29.898) Hemiplegia - Right/Left:  Left Hemiplegia - dominant/non-dominant: Non-dominant Hemiplegia - caused by: Cerebral infarction     Time: 0626-9485 PT Time Calculation (min) (ACUTE ONLY): 19 min  Charges:  $Gait Training: 8-22 mins                     Kittie Plater, PT, DPT Acute Rehabilitation Services Pager #: [phone] Office #: [phone]    Berline Lopes 10/11/2020, 2:38 PM

## 2020-10-11 NOTE — H&P (Incomplete)
Physical Medicine and Rehabilitation Admission H&P    Chief Complaint  Patient presents with  . Code Stroke  : HPI: Erin Yates is a 74 year old right-handed female with history of CKD stage III, diabetes mellitus hypertension atrial fibrillation maintained on Eliquis as well as previous PFO closure 05/2009, tobacco abuse.  History taken from chart review.  Patient lives with spouse.  Two-level home bed and bath main level 3 steps to entry.  Independent ambulation without assistive device.  Presented 10/02/2020 with left-sided weakness.  Admission chemistries with glucose 178, creatinine 1.60, hemoglobin 11.7, alcohol negative, hemoglobin A1c 6.2, MRSA PCR screening positive.  CT of the head unremarkable for acute intracranial process.  Multiple chronic infarcts.  CT angiogram of head and neck emergent proximal right M2 occlusion.  Patient underwent mechanical thrombectomy per interventional radiology.  Follow-up CT scan previously reviewed limited study.  Per report severely limited study findings consistent with evolving posterior right MCA territory infarct with progressive edema.  There was hyperdensity in the region of the right sylvian fissure concerning for acute SAH but suboptimally evaluated.  Echocardiogram with ejection fraction of 60 to 65% no wall motion abnormalities.  Neurology follow-up Eliquis initially held but has since been resumed with latest cranial CT scan 10/11/2020 showing no midline shift small amount of subarachnoid hemorrhage in the right sylvian fissure unchanged from prior study.  Cardiology services consulted 10/05/2020 for atrial fibrillation with RVR and did receive IV metoprolol and amiodarone and plan is to follow-up outpatient with cardiology services to consider Watchman device.  Patient did initially have bouts of agitation and restlessness.  Her diet has been advanced to a regular consistency.  Therapy evaluations completed and patient was admitted for a comprehensive  rehab program.  Review of Systems  Constitutional: Negative for chills and fever.  HENT: Negative for hearing loss.   Eyes: Negative for blurred vision and double vision.  Respiratory: Negative for cough and shortness of breath.   Cardiovascular: Positive for palpitations and leg swelling.  Gastrointestinal: Positive for constipation. Negative for heartburn, nausea and vomiting.  Genitourinary: Negative for dysuria, flank pain and hematuria.  Musculoskeletal: Positive for joint pain and myalgias.  Skin: Negative for rash.  Neurological: Positive for speech change, weakness and headaches.  Psychiatric/Behavioral: Positive for depression.       Anxiety  All other systems reviewed and are negative.  Past Medical History:  Diagnosis Date  . Anemia    past  hx  . Anxiety   . Depression   . Diabetes mellitus without complication ([place])   . Dysrhythmia    prior to PFO correction  . GERD (gastroesophageal reflux disease)   . Headache    before PFO closure  . Hypercholesteremia   . Hypertension   . Hypothyroidism   . 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 PFO (patent foramen ovale)    correction device placed 2010  . Right leg numbness    lower leg- residual from CVA  . Seasonal allergies   . Stroke [facility])    prior to PFO correction   Past Surgical History:  Procedure Laterality Date  . ABDOMINAL HYSTERECTOMY    . BUBBLE STUDY  02/06/2020   Procedure: BUBBLE STUDY;  Surgeon: Sanda Klein, MD;  Location: [facility] [facility];  Service: Cardiovascular;;  . [place]  . CATARACT EXTRACTION W/PHACO Right 09/01/2015   Procedure: CATARACT EXTRACTION PHACO AND INTRAOCULAR LENS PLACEMENT (IOC);  Surgeon: Leandrew Koyanagi, MD;  Location: [place];  Service: Ophthalmology;  Laterality: Right;  DIABETIC - oral meds  . CATARACT EXTRACTION W/PHACO Left 10/20/2015   Procedure: CATARACT EXTRACTION PHACO AND INTRAOCULAR LENS PLACEMENT (IOC);  Surgeon:  Leandrew Koyanagi, MD;  Location: [place];  Service: Ophthalmology;  Laterality: Left;  DIABETIC - oral meds  . EP IMPLANTABLE DEVICE N/A 04/04/2016   Procedure: Loop Recorder Insertion;  Surgeon: Deboraha Sprang, MD;  Location: [place] CV LAB;  Service: Cardiovascular;  Laterality: N/A;  . FOOT NEUROMA SURGERY Left   . IR CT HEAD LTD  10/02/2020  . IR PERCUTANEOUS ART THROMBECTOMY/INFUSION INTRACRANIAL INC DIAG ANGIO  10/02/2020  . IR US GUIDE VASC ACCESS RIGHT  10/02/2020  . NASAL SINUS SURGERY    . PATENT FORAMEN OVALE CLOSURE    . PATENT FORAMEN OVALE CLOSURE    . RADIOLOGY WITH ANESTHESIA N/A 10/02/2020   Procedure: IR WITH ANESTHESIA;  Surgeon: Luanne Bras, MD;  Location: [place];  Service: Radiology;  Laterality: N/A;  . TEE WITHOUT CARDIOVERSION N/A 02/09/2016   Procedure: TRANSESOPHAGEAL ECHOCARDIOGRAM (TEE);  Surgeon: Adrian Prows, MD;  Location: [place];  Service: Cardiovascular;  Laterality: N/A;  . TEE WITHOUT CARDIOVERSION N/A 11/12/2019   Procedure: TRANSESOPHAGEAL ECHOCARDIOGRAM (TEE);  Surgeon: Minna Merritts, MD;  Location: [facility];  Service: Cardiovascular;  Laterality: N/A;  . TEE WITHOUT CARDIOVERSION N/A 02/06/2020   Procedure: TRANSESOPHAGEAL ECHOCARDIOGRAM (TEE);  Surgeon: Sanda Klein, MD;  Location: [facility] END[facility];  Service: Cardiovascular;  Laterality: N/A;   Family History  Problem Relation Age of Onset  . Dementia Father   . Breast cancer Sister 52   Social History:  reports that she has been smoking. She has a 55.00 pack-year smoking history. She has never used smokeless tobacco. She reports that she does not drink alcohol and does not use drugs. Allergies:  Allergies  Allergen Reactions  . Alendronate Other (See Comments)    Rash   . Penicillins Rash    Also swelling and itching  Did it involve swelling of the face/tongue/throat, SOB, or low BP? Yes Did it involve sudden or severe rash/hives, skin peeling, or any reaction on  the inside of your mouth or nose? Yes Did you need to seek medical attention at a hospital or doctor's office? Yes When did it last happen?More than 15 years ago If all above answers are "NO", may proceed with cephalosporin use.    Medications Prior to Admission  Medication Sig Dispense Refill  . ALPRAZolam (XANAX) 0.25 MG tablet Take 0.25 mg by mouth 2 (two) times daily as needed for anxiety.    Marland Kitchen amLODipine (NORVASC) 10 MG tablet Take 10 mg by mouth every evening.     Marland Kitchen atorvastatin (LIPITOR) 80 MG tablet Take 80 mg by mouth daily.    Marland Kitchen buPROPion (WELLBUTRIN SR) 150 MG 12 hr tablet Take 1 tablet (150 mg total) by mouth 2 (two) times daily. 60 tablet 5  . cholecalciferol (VITAMIN D3) 25 MCG (1000 UT) tablet Take 1,000 Units by mouth daily.    Marland Kitchen desvenlafaxine (PRISTIQ) 100 MG 24 hr tablet Take 100 mg by mouth daily.    Marland Kitchen ELIQUIS 5 MG TABS tablet TAKE 1 TABLET (5 MG TOTAL) BY MOUTH 2 (TWO) TIMES DAILY. (Patient taking differently: Take 5 mg by mouth 2 (two) times daily. ) 180 tablet 1  . ferrous sulfate 325 (65 FE) MG tablet Take 325 mg by mouth every evening.    . fluticasone (FLONASE) 50 MCG/ACT nasal  spray Place into both nostrils daily as needed for allergies or rhinitis.    Marland Kitchen levothyroxine (SYNTHROID, LEVOTHROID) 50 MCG tablet Take 50 mcg by mouth daily before breakfast.    . losartan (COZAAR) 100 MG tablet Take 100 mg by mouth daily.     . metFORMIN (GLUCOPHAGE) 1000 MG tablet Take 1,000 mg by mouth daily with breakfast.     . pantoprazole (PROTONIX) 40 MG tablet Take 40 mg by mouth 2 (two) times daily. Reported on 04/17/2016    . traZODone (DESYREL) 50 MG tablet Take 50 mg by mouth at bedtime as needed for sleep.     . vitamin B-12 (CYANOCOBALAMIN) 500 MCG tablet Take 500 mcg by mouth every evening.       Drug Regimen Review Drug regimen was reviewed and remains appropriate with no significant issues identified  Home: Home Living Family/patient expects to be discharged to::  Private residence Living Arrangements: Spouse/significant other Available Help at Discharge: Family, Available 24 hours/day Type of Home: House Home Access: Stairs to enter [employer] of Steps: 3 Entrance Stairs-Rails: Right, Left, Can reach both Home Layout: Two level, Able to live on main level with bedroom/bathroom Bathroom Shower/Tub: Tub/shower unit, Architectural technologist: Standard Bathroom Accessibility: Yes Home Equipment: Environmental consultant - 2 wheels, Cane - single point, Shower seat, Grab bars - tub/shower, Hand held shower head Additional Comments: wears glasses for reading, not here in the hospital.  Likes coloring (may need her glasses to do this successfully).   Lives With: Spouse   Functional History: Prior Function Level of Independence: Independent Comments: Ind amb community distances without an AD, no fall history, Ind with ADLs  Functional Status:  Mobility: Bed Mobility Overal bed mobility: Needs Assistance Bed Mobility: Supine to Sit, Sit to Supine Supine to sit: Supervision, HOB elevated Sit to supine: Supervision, HOB elevated General bed mobility comments: supervision for safety  Transfers Overall transfer level: Needs assistance Equipment used: Rolling walker (2 wheeled) Transfers: Sit to/from Stand Sit to Stand: Min guard Stand pivot transfers: Min assist General transfer comment: Min guard for safety, cues for proper hand placement. Ambulation/Gait Ambulation/Gait assistance: Min assist Gait Distance (Feet): 120 Feet Assistive device: Rolling walker (2 wheeled) Gait Pattern/deviations: Step-through pattern, Decreased stride length, Decreased step length - left, Narrow base of support, Trunk flexed, Drifts right/left, Decreased dorsiflexion - right, Decreased dorsiflexion - left General Gait Details: Intermittent LOB resulting in minA to maintain safety. Pt tends to stay more proximal to R side of RW. When turning pt steps L foot laterally outside  RW and requires repeated extensive cues to acknowledge it and correct. Intermittent bouts of hitting obstacles with RW on L side. Pt tried to leave RW 3x during session and required cues to keep it with her for safety. Verbal and visual cues for heel-to-toe and inc step length. Gait velocity: decreased significantly Gait velocity interpretation: <1.31 ft/sec, indicative of household ambulator    ADL: ADL Overall ADL's : Needs assistance/impaired Eating/Feeding: Set up, Sitting Grooming: Wash/dry hands, Wash/dry face, Oral care, Brushing hair, Minimal assistance, Standing Grooming Details (indicate cue type and reason): assist to locate items  Upper Body Bathing: Minimal assistance, Sitting Lower Body Bathing: Minimal assistance, Sit to/from stand Upper Body Dressing : Minimal assistance, Sitting Lower Body Dressing: Moderate assistance, Sit to/from stand Toilet Transfer: Minimal assistance, +2 for safety/equipment, Ambulation, Comfort height toilet, Grab bars Toilet Transfer Details (indicate cue type and reason): Pt requires min A for ambulation into bathroom.  When distracted, she runs  into items such as door frame, and other objects on the Lt  Toileting- Clothing Manipulation and Hygiene: Sit to/from stand, Minimal assistance Toileting - Clothing Manipulation Details (indicate cue type and reason): assist to locate toilet paper, steadying assist in standing while pt performing pericare  Functional mobility during ADLs: Minimal assistance, +2 for safety/equipment  Cognition: Cognition Overall Cognitive Status: Impaired/Different from baseline Arousal/Alertness: Awake/alert Orientation Level: Oriented X4 Attention: Sustained Sustained Attention: Impaired Sustained Attention Impairment: Verbal basic, Functional basic Memory: Impaired Memory Impairment: Decreased recall of new information Awareness: Impaired Awareness Impairment: Intellectual impairment, Emergent impairment,  Anticipatory impairment Problem Solving: Impaired Problem Solving Impairment: Functional basic, Verbal basic Behaviors: Impulsive Safety/Judgment: Impaired Cognition Arousal/Alertness: Awake/alert Behavior During Therapy: WFL for tasks assessed/performed Overall Cognitive Status: Impaired/Different from baseline Area of Impairment: Attention, Following commands, Safety/judgement, Awareness, Problem solving Orientation Level: Disoriented to, Place, Situation Current Attention Level: Sustained Memory: Decreased short-term memory Following Commands: Follows multi-step commands inconsistently, Follows one step commands consistently, Follows one step commands with increased time, Follows multi-step commands with increased time Safety/Judgement: Decreased awareness of safety, Decreased awareness of deficits Awareness: Emergent Problem Solving: Slow processing, Difficulty sequencing, Requires verbal cues General Comments: Pt unaware of L leg being placed lateral to RW during turns and requires extensive cues to gain attention to L leg and correct. Pt bumps obstacles with L and attempts to leave RW multiple times during session and requires cues to maintain safety.  Physical Exam: Blood pressure (!) 134/44, pulse (!) 52, temperature 98.2 F (36.8 C), temperature source Oral, resp. rate 18, height 5\' 5"  (1.651 m), weight 61.1 kg, SpO2 93 %. Physical Exam Neurological:     Comments: Patient is alert in no acute distress.  Makes eye contact and follows commands.  Oriented x3     Results for orders placed or performed during the hospital encounter of 10/02/20 (from the past 48 hour(s))  Glucose, capillary     Status: Abnormal   Collection Time: 10/09/20 12:13 PM  Result Value Ref Range   Glucose-Capillary 176 (H) 70 - 99 mg/dL    Comment: Glucose reference range applies only to samples taken after fasting for at least 8 hours.  Glucose, capillary     Status: None   Collection Time: 10/09/20   4:16 PM  Result Value Ref Range   Glucose-Capillary 72 70 - 99 mg/dL    Comment: Glucose reference range applies only to samples taken after fasting for at least 8 hours.  Glucose, capillary     Status: Abnormal   Collection Time: 10/09/20  9:38 PM  Result Value Ref Range   Glucose-Capillary 115 (H) 70 - 99 mg/dL    Comment: Glucose reference range applies only to samples taken after fasting for at least 8 hours.   Comment 1 Notify RN    Comment 2 Document in Chart   CBC     Status: Abnormal   Collection Time: 10/10/20  1:28 AM  Result Value Ref Range   WBC 6.8 4.0 - 10.5 K/uL   RBC 3.11 (L) 3.87 - 5.11 MIL/uL   Hemoglobin 10.0 (L) 12.0 - 15.0 g/dL   HCT 30.1 (L) 36 - 46 %   MCV 96.8 80.0 - 100.0 fL   MCH 32.2 26.0 - 34.0 pg   MCHC 33.2 30.0 - 36.0 g/dL   RDW 12.9 11.5 - 15.5 %   Platelets 242 150 - 400 K/uL   nRBC 0.0 0.0 - 0.2 %    Comment: Performed at [facility]  [facility], [place] [address], [place], [place] [address]  Basic metabolic panel     Status: Abnormal   Collection Time: 10/10/20  1:28 AM  Result Value Ref Range   Sodium 139 135 - 145 mmol/L   Potassium 4.1 3.5 - 5.1 mmol/L   Chloride 110 98 - 111 mmol/L   CO2 19 (L) 22 - 32 mmol/L   Glucose, Bld 121 (H) 70 - 99 mg/dL    Comment: Glucose reference range applies only to samples taken after fasting for at least 8 hours.   BUN 20 8 - 23 mg/dL   Creatinine, Ser 1.72 (H) 0.44 - 1.00 mg/dL   Calcium 9.6 8.9 - 10.3 mg/dL   GFR, Estimated 31 (L) >60 mL/min    Comment: (NOTE) Calculated using the CKD-EPI Creatinine Equation (2021)    Anion gap 10 5 - 15    Comment: Performed at [facility] [address], [place], [place] [address]  Glucose, capillary     Status: Abnormal   Collection Time: 10/10/20  6:07 AM  Result Value Ref Range   Glucose-Capillary 113 (H) 70 - 99 mg/dL    Comment: Glucose reference range applies only to samples taken after fasting for at least 8 hours.   Comment 1 Notify RN    Comment 2  Document in Chart   Glucose, capillary     Status: Abnormal   Collection Time: 10/10/20 11:42 AM  Result Value Ref Range   Glucose-Capillary 194 (H) 70 - 99 mg/dL    Comment: Glucose reference range applies only to samples taken after fasting for at least 8 hours.  Glucose, capillary     Status: Abnormal   Collection Time: 10/10/20  5:57 PM  Result Value Ref Range   Glucose-Capillary 150 (H) 70 - 99 mg/dL    Comment: Glucose reference range applies only to samples taken after fasting for at least 8 hours.  Glucose, capillary     Status: Abnormal   Collection Time: 10/10/20  9:18 PM  Result Value Ref Range   Glucose-Capillary 133 (H) 70 - 99 mg/dL    Comment: Glucose reference range applies only to samples taken after fasting for at least 8 hours.   Comment 1 Notify RN    Comment 2 Document in Chart   CBC     Status: Abnormal   Collection Time: 10/11/20  1:22 AM  Result Value Ref Range   WBC 6.1 4.0 - 10.5 K/uL   RBC 2.86 (L) 3.87 - 5.11 MIL/uL   Hemoglobin 9.5 (L) 12.0 - 15.0 g/dL   HCT 27.5 (L) 36 - 46 %   MCV 96.2 80.0 - 100.0 fL   MCH 33.2 26.0 - 34.0 pg   MCHC 34.5 30.0 - 36.0 g/dL   RDW 12.8 11.5 - 15.5 %   Platelets 256 150 - 400 K/uL   nRBC 0.0 0.0 - 0.2 %    Comment: Performed at [facility], [place] [address], [place], [place] [address]  Basic metabolic panel     Status: Abnormal   Collection Time: 10/11/20  1:22 AM  Result Value Ref Range   Sodium 140 135 - 145 mmol/L   Potassium 4.2 3.5 - 5.1 mmol/L   Chloride 112 (H) 98 - 111 mmol/L   CO2 21 (L) 22 - 32 mmol/L   Glucose, Bld 118 (H) 70 - 99 mg/dL    Comment: Glucose reference range applies only to samples taken after fasting for  at least 8 hours.   BUN 22 8 - 23 mg/dL   Creatinine, Ser 1.75 (H) 0.44 - 1.00 mg/dL   Calcium 9.3 8.9 - 10.3 mg/dL   GFR, Estimated 30 (L) >60 mL/min    Comment: (NOTE) Calculated using the CKD-EPI Creatinine Equation (2021)    Anion gap 7 5 - 15    Comment: Performed at [facility] [address], [place], [place] [address]  Magnesium     Status: None   Collection Time: 10/11/20  1:22 AM  Result Value Ref Range   Magnesium 1.7 1.7 - 2.4 mg/dL    Comment: Performed at [facility] [address] [place],  [address]   CT Head Wo Contrast  Result Date: 10/10/2020 CLINICAL DATA:  Follow-up stroke. EXAM: CT HEAD WITHOUT CONTRAST TECHNIQUE: Contiguous axial images were obtained from the base of the skull through the vertex without intravenous contrast. COMPARISON:  CT head 10/07/2020 FINDINGS: Brain: Involving acute infarct in the right MCA territory posterior division. Diffuse cytotoxic edema. No hemorrhagic transformation. Mild subarachnoid hemorrhage in the right sylvian fissure unchanged. No midline shift Ventricle size normal. Chronic infarcts in the left frontal and parietal lobe and in the occipital lobe bilaterally. No new area of hemorrhage or infarction. Vascular: Negative for hyperdense vessel Skull: Negative Sinuses/Orbits: Mild sinus mucosal disease. Bilateral cataract extraction Other: None IMPRESSION: Expected changes in acute infarct right MCA territory. No midline shift. Small amount of subarachnoid hemorrhage in the right sylvian fissure unchanged from the prior study. Electronically Signed   By: Franchot Gallo M.D.   On: 10/10/2020 06:41       Medical Problem List and Plan: 1.  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  Antithrombotics: -DVT/anticoagulation: Eliquis  -antiplatelet therapy: N/A 3. Pain Management: Fioricet as needed for headaches 4. Mood: Wellbutrin 150 mg twice daily, Effexor 150 mg daily, Xanax 0.5 mg twice daily as needed  -antipsychotic agents: Seroquel 25 mg twice daily 5. Neuropsych: This patient is capable of making decisions on her own behalf. 6. Skin/Wound Care: Routine skin checks 7.  Fluids/Electrolytes/Nutrition: Routine in and outs with follow-up chemistries 8.  Atrial fibrillation.  Amiodarone 400 mg twice daily, cardiac rate controlled.  Follow-up cardiology services 9.  Hypertension.  Norvasc 10 mg daily.  Monitor with increased mobility 10.  Diabetes mellitus.  Hemoglobin A1c 6.2.  SSI.  Patient on Glucophage 1000 mg daily prior to admission.  Resume as needed 11.  CKD stage III.  Creatinine baseline on admission 1.60.  Follow-up chemistries 12.  Tobacco abuse.  NicoDerm patch.  Counseling 13.  Hyperlipidemia.  Crestor 14.  Hypothyroidism.  Synthroid ***  Lavon Paganini Shaunessy Dobratz, PA-C 10/11/2020

## 2020-10-11 NOTE — Progress Notes (Signed)
Electrophysiology Rounding Note  Patient Name: Erin Yates Date of Encounter: 10/11/2020  Primary Cardiologist: Virl Axe, MD Electrophysiologist: Virl Axe, MD   Subjective   The patient is doing well today.  She is excited to be going home tomorrow with HHPT arranged.   Inpatient Medications    Scheduled Meds: .  stroke: mapping our early stages of recovery book   Does not apply Once  . amiodarone  400 mg Oral BID  . amLODipine  10 mg Oral QPM  . apixaban  5 mg Oral BID  . buPROPion  150 mg Oral BID  . insulin aspart  0-6 Units Subcutaneous TID WC & HS  . levothyroxine  50 mcg Oral QAC breakfast  . magnesium oxide  400 mg Oral Daily  . mouth rinse  15 mL Mouth Rinse BID  . nicotine  14 mg Transdermal Daily  . pantoprazole  40 mg Oral Daily  . QUEtiapine  25 mg Oral BID  . rosuvastatin  40 mg Oral Daily  . senna  1 tablet Oral Daily  . venlafaxine XR  150 mg Oral Q breakfast  . vitamin B-12  500 mcg Oral QPM   Continuous Infusions: . sodium chloride 70 mL/hr at 10/11/20 1113   PRN Meds: acetaminophen **OR** acetaminophen (TYLENOL) oral liquid 160 mg/5 mL **OR** acetaminophen, acetaminophen-codeine, ALPRAZolam, butalbital-acetaminophen-caffeine, haloperidol lactate   Vital Signs    Vitals:   10/10/20 1951 10/10/20 2324 10/11/20 0338 10/11/20 1153  BP: (!) 149/61 (!) 154/65 (!) 134/44 138/70  Pulse: (!) 56 (!) 59 (!) 52 (!) 57  Resp: 18 19 18 18   Temp: 98.2 F (36.8 C) 98.1 F (36.7 C) 98.2 F (36.8 C) 98 F (36.7 C)  TempSrc: Oral Oral Oral Oral  SpO2: 98% 93% 93% 96%  Weight:      Height:        Intake/Output Summary (Last 24 hours) at 10/11/2020 1551 Last data filed at 10/11/2020 1240 Gross per 24 hour  Intake 320 ml  Output --  Net 320 ml   Filed Weights   10/02/20 1933  Weight: 61.1 kg    Physical Exam    GEN- The patient is well appearing, alert and oriented x 3 today.   Head- normocephalic, atraumatic Eyes-  Sclera clear,  conjunctiva pink Ears- hearing intact Oropharynx- clear Neck- supple Lungs- Clear to ausculation bilaterally, normal work of breathing Heart- Regular rate and rhythm, no murmurs, rubs or gallops GI- soft, NT, ND, + BS Extremities- no clubbing or cyanosis. No edema Skin- no rash or lesion Psych- euthymic mood, full affect Neuro- strength and sensation are intact  Labs    CBC Recent Labs    10/10/20 0128 10/11/20 0122  WBC 6.8 6.1  HGB 10.0* 9.5*  HCT 30.1* 27.5*  MCV 96.8 96.2  PLT 242 937   Basic Metabolic Panel Recent Labs    10/10/20 0128 10/11/20 0122  NA 139 140  K 4.1 4.2  CL 110 112*  CO2 19* 21*  GLUCOSE 121* 118*  BUN 20 22  CREATININE 1.72* 1.75*  CALCIUM 9.6 9.3  MG  --  1.7   Liver Function Tests No results for input(s): AST, ALT, ALKPHOS, BILITOT, PROT, ALBUMIN in the last 72 hours. No results for input(s): LIPASE, AMYLASE in the last 72 hours. Cardiac Enzymes No results for input(s): CKTOTAL, CKMB, CKMBINDEX, TROPONINI in the last 72 hours.   Telemetry    Sinus brady/NSR 50-60s (personally reviewed)  Radiology  CT Head Wo Contrast  Result Date: 10/10/2020 CLINICAL DATA:  Follow-up stroke. EXAM: CT HEAD WITHOUT CONTRAST TECHNIQUE: Contiguous axial images were obtained from the base of the skull through the vertex without intravenous contrast. COMPARISON:  CT head 10/07/2020 FINDINGS: Brain: Involving acute infarct in the right MCA territory posterior division. Diffuse cytotoxic edema. No hemorrhagic transformation. Mild subarachnoid hemorrhage in the right sylvian fissure unchanged. No midline shift Ventricle size normal. Chronic infarcts in the left frontal and parietal lobe and in the occipital lobe bilaterally. No new area of hemorrhage or infarction. Vascular: Negative for hyperdense vessel Skull: Negative Sinuses/Orbits: Mild sinus mucosal disease. Bilateral cataract extraction Other: None IMPRESSION: Expected changes in acute infarct right  MCA territory. No midline shift. Small amount of subarachnoid hemorrhage in the right sylvian fissure unchanged from the prior study. Electronically Signed   By: Franchot Gallo M.D.   On: 10/10/2020 06:41    Patient Profile     Erin Yates a 74 y.o.femalewith a hx of prior strokes, DM, hypothyroidism, HTN, AFIB (Dr. [name] describes as SCAF), PFO (s/p closure amplazter 2010), known calcified MV lesion, smoking seen following recurrent stroke on Apixoban  And with AF and RVR  Assessment & Plan    Active Problems:   Stroke (cerebrum) ([place])   Acute ischemic stroke ([place])   Controlled type 2 diabetes mellitus with hyperglycemia (HCC)   Essential hypertension   Atrial fibrillation (HCC)   Dysphagia, post-stroke Bradycardia  Hypokalemia/ hypomagnesemia >> resolved   Continue po amiodarone. Will discuss taper with Dr. [name].  Dr. Quentin Ore will see the patient on 10/26/2020 to discuss consideration for Watchman.   For questions or updates, please contact [place] Please consult [email or web address] for contact info under Cardiology/STEMI.  Signed, Shirley Friar, PA-C  10/11/2020, 3:51 PM

## 2020-10-11 NOTE — Plan of Care (Signed)
  Problem: Coping: Goal: Will verbalize positive feelings about self Outcome: Progressing Goal: Will identify appropriate support needs Outcome: Progressing   Problem: Nutrition: Goal: Risk of aspiration will decrease Outcome: Progressing Goal: Dietary intake will improve Outcome: Progressing   Problem: Spontaneous Subarachnoid Hemorrhage Tissue Perfusion: Goal: Complications of Spontaneous Subarachnoid Hemorrhage will be minimized Outcome: Progressing   Problem: Ischemic Stroke/TIA Tissue Perfusion: Goal: Complications of ischemic stroke/TIA will be minimized Outcome: Progressing   Problem: Safety: Goal: Ability to remain free from injury will improve Outcome: Progressing

## 2020-10-11 NOTE — Progress Notes (Signed)
Inpatient Rehabilitation Admissions Coordinator  I met with patient at bedside with her spouse. I observed patient while working with Physical Therapy today. Patient has progressed and no longer in need of a Cir admit. Discussed with Dr. Leonie Man and we will recommend Home Health at this time. We will sign off.  Danne Baxter, RN, MSN Rehab Admissions Coordinator [phone] 10/11/2020 11:21 AM

## 2020-10-11 NOTE — Progress Notes (Signed)
STROKE TEAM PROGRESS NOTE   INTERVAL HISTORY Patient states she is doing well.  She is walking with physical therapist.  She appears to have recovered that she can go home and get home therapy instead of inpatient rehab.  Vital signs are stable.  Neurological exam is unchanged.  OBJECTIVE Vitals:   10/10/20 1614 10/10/20 1951 10/10/20 2324 10/11/20 0338  BP: 130/68 (!) 149/61 (!) 154/65 (!) 134/44  Pulse: (!) 55 (!) 56 (!) 59 (!) 52  Resp: 18 18 19 18   Temp: 98.2 F (36.8 C) 98.2 F (36.8 C) 98.1 F (36.7 C) 98.2 F (36.8 C)  TempSrc: Oral Oral Oral Oral  SpO2: 100% 98% 93% 93%  Weight:      Height:       CBC:  Recent Labs  Lab 10/10/20 0128 10/11/20 0122  WBC 6.8 6.1  HGB 10.0* 9.5*  HCT 30.1* 27.5*  MCV 96.8 96.2  PLT 242 416   Basic Metabolic Panel:  Recent Labs  Lab 10/05/20 0657 10/06/20 0337 10/07/20 0029 10/08/20 0315 10/10/20 0128 10/11/20 0122  NA  --    < > 140   < > 139 140  K  --    < > 3.9   < > 4.1 4.2  CL  --    < > 112*   < > 110 112*  CO2  --    < > 16*   < > 19* 21*  GLUCOSE  --    < > 127*   < > 121* 118*  BUN  --    < > 14   < > 20 22  CREATININE  --    < > 1.35*   < > 1.72* 1.75*  CALCIUM  --    < > 8.9   < > 9.6 9.3  MG 1.0*  --  2.5*  --   --  1.7  PHOS 3.3  --   --   --   --   --    < > = values in this interval not displayed.   Lipid Panel:     Component Value Date/Time   CHOL 115 10/03/2020 0105   TRIG 442 (H) 10/03/2020 0105   HDL 35 (L) 10/03/2020 0105   CHOLHDL 3.3 10/03/2020 0105   VLDL UNABLE TO CALCULATE IF TRIGLYCERIDE OVER 400 mg/dL 10/03/2020 0105   LDLCALC UNABLE TO CALCULATE IF TRIGLYCERIDE OVER 400 mg/dL 10/03/2020 0105   HgbA1c:  Lab Results  Component Value Date   HGBA1C 6.2 (H) 10/03/2020   Urine Drug Screen: No results found for: LABOPIA, COCAINSCRNUR, LABBENZ, AMPHETMU, THCU, LABBARB  Alcohol Level     Component Value Date/Time   ETH <10 10/03/2020 0105    IMAGING  CT HEAD CODE STROKE WO  CONTRAST 10/02/2020 1. No acute infarct or intracranial hemorrhage identified.  2. ASPECTS is 10.  3. Hyperdense right MCA which will be evaluated on pending CTA.  4. Multiple chronic infarcts.   CT Code Stroke CTA Head W/WO contrast CT Code Stroke CTA Neck W/WO contrast 10/02/2020 1. Emergent proximal right M2 occlusion.  2. Mild carotid and vertebral atherosclerosis without significant stenosis.  3.  Aortic Atherosclerosis (ICD10-I70.0).   Cerebral Angio / IR 10/02/2020 R M2 occlusion (TICI0). 1st pass-Trevo 49mm & CAT 6 w/ aspiration. 2nd pass-Trevo 35mm & CAT 5 w/ aspiration. 3rd pass-Solitaire 4mm & Zoom 71 w/ aspiration. Final TICI3.   Post IR CT 10/02/2020 Small volume SAH at the sylvian fissure and right perisylvian  sulci         CT HEAD WO CONTRAST 10/03/2020 Nondiagnostic evaluation due to patient intolerance and motion. The technologist was unable to perform a repeat exam due to safety Concern.  CT HEAD WO CONTRAST 10/04/2020  Severely limited study with findings consistent with an evolving posterior right MCA territory infarct with progressive edema. Thereis hyperdensity in the region of the right sylvian fissure, concerning for acute subarachnoid hemorrhage but suboptimally evaluated. There is sulcal effacement without substantial midline shift.  CT HEAD WO CONTRAST 10/07/2020 Large area of acute infarct right posterior MCA territory involving basal ganglia and temporoparietal lobe. Mild amount of subarachnoid hemorrhage in the right sylvian fissure unchanged from 10/04/2020. Multiple chronic cortical infarcts. Findings suggest recurrent cerebral emboli.   Transthoracic Echocardiogram  10/04/2020 1. Left ventricular ejection fraction, by estimation, is 60 to 65%. The left ventricle has normal function. The left ventricle has no regional wall motion abnormalities. 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 Left ventricular  diastolic parameters are indeterminate.  2. Right ventricular systolic function is normal. The right ventricular size is normal. Tricuspid regurgitation signal is inadequate for assessing PA pressure.  3. The mitral valve is degenerative. Mild mitral valve regurgitation. Mild mitral stenosis. 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  4. The aortic valve is tricuspid. Aortic valve regurgitation is not visualized. No aortic stenosis is present.  5. The inferior vena cava is normal in size with greater than 50% respiratory variability, suggesting right atrial pressure of 3 mmHg.   ECG - Sinus rhythm with Premature supraventricular complexes; Left anterior fascicular block; Minimal voltage criteria for LVH, may be normal variant ( R in aVL )   PHYSICAL EXAM       Temp:  [97.2 F (36.2 C)-98.2 F (36.8 C)] 98.2 F (36.8 C) (12/06 0338) Pulse Rate:  [52-63] 52 (12/06 0338) Resp:  [18-19] 18 (12/06 0338) BP: (130-154)/(44-68) 134/44 (12/06 0338) SpO2:  [93 %-100 %] 93 % (12/06 0338)  General - Well nourished, well developed pleasant elderly Caucasian lady, not in acute distress.  Ophthalmologic - fundi not visualized due to noncooperation.  Cardiovascular - regular rate and rhythm, not in A. fib  Neuro - awake, alert, oriented to place, time, age and people. No aphasia, following simple commands. Decreased visual acuity, with left lower quadrantanopsia. No gaze palsy but right gaze preference.  Facial symmetrical, tongue protrusion midline. Left UE 4/5 proximal with mild drift and 4-/5 distal with decreased finger grip, RUE at least 4+/5 proximal and distal. BLE 4/5. Sensation symmetrical subjectively, coordination intact b/l FTN and gait not tested.   ASSESSMENT/PLAN Ms. Francyne Arreaga is a 74 y.o. female with history of diabetes, hypothyroidism, ongoing tobacco use, HTN,  HLD, hx of PFO closure, implantable loop, hypertension, atrial fibrillation on eliquis as well as multiple previous strokes (residual visual deficits) who presents with left-sided weakness, a fall, rightward gaze, and confusion. She did not receive IV t-PA due to anticoagulation with Eliquis. 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 However, pt does have multiple stroke risk factors.   CT Head -  No acute infarct or intracranial hemorrhage identified. ASPECTS is 10. Hyperdense right MCA. Multiple chronic infarcts.  CTA H&N - Emergent proximal right M2 occlusion. Mild carotid and vertebral atherosclerosis without significant stenosis.   IR TICI3 of right M2 occlusion, Small volume SAH at the sylvian fissure and right perisylvian sulci    CT head 11/29 an evolving posterior right MCA territory infarct with progressive edema. There is hyperdensity in the region of the right sylvian fissure  MRI and MRA head - initially hold off due to agitation and noncooperation, now pt refused due to claustrophobia  Repeat CT 12/2 unchanged large right MCA stroke with small sylvian fissure SAH  2D Echo - EF 60-65.%. degenerative MV w/ echodensity base posterior leaflet c/w severe P3 prolapse  Lacey Jensen Virus 2 - negative  TG 442, LDL - 44  HgbA1c - 6.2  VTE prophylaxis - SCDs  Eliquis (apixaban) daily prior to admission, now Eliquis restarted   therapy recommendations:  CIR->HH PT, OT  Disposition:  Pending  Will keep another day d/t continued elevated Cre. Increase IVF and plan d/c home w/ [facility] tomorrow.  Hx stroke/TIA  09/2007 with RLE hp  11/2007 with LLE numbness  01/2008 probable stroke with RUE hp  07/2010 L occipital and R basal ganglia infarct  PFO diagnosed 2009, underwent closure 04/2009 [facility] Dr[facility] Jenne [facility]ane [facility]  02/2016 left hemianopia status post TPA.  MRI showed right parietal and temporal infarcts.  CT head and neck right P3 occlusion.  EF 60 to 65%.  TEE showed PFO closure device in place.  LDL 79 A1c 6.4.  Discharged with DAPT.  11/2019 admitted for aphasia.  MRI showed left parietal lobe infarct.  MRI high-grade stenosis left A1, moderate stenosis right P2. Loop recorder positive for A. fib at that time, put on Eliquis.  Follows with Dr. Leonie Man at [facility]  PAF->AF w/ RVR Tachypalpitation  Follow with Dr. [name] cardiology  Loop recorder showed subclinical A. Fib  On Eliquis PTA, compliant per husband  On recent cardiac event monitoring with Dr. [name]  Now on ASA 81  No AC at the time until [facility] resolves. Will repeat CT in one week  RVR during the night 11/30. Placed on amio gtt w/ metoprolol IV and po.   bolused IV amio as still w/ transient RVR -> now under control  On po Amino   Hold off metoprolol per cardiology given hx of bradycardia  Cardiology on board, appreciate help  Per Dr. [name], may be candidate for [facility] device. He will see pt in clinic to further discuss  Agitation/confusion/delirium/vascular dementia  Likely due to bilateral acute and chronic infarcts  On four-point restraint -> off now   Not able to get MRI  EKG no QT prolongation  On haldol and ativan PRN  On Seroquel 25 bid  Resume home wellbutrin, effexor  Restless 11/30 w/o sedation all night in AF RVR.   Resumed home xanax 0.5 bid prn  Symptoms improved  Hypertension Bradycardia  Home BP meds: Norvasc ; Cozaar  Current BP meds: off Cleviprex   SBP goal < 160   BP in the 150s  Resume Norvasc 10  Off metoprolol, resumed cozaar 50->100 . Long-term BP goal normotensive  Hyperlipidemia  Home Lipid lowering medication: Lipitor 80 mg daily   LDL 44, LDL goal < 70  Lipitor 80 -> crestor 40 (per Dr. [name])  Continue statin at discharge  Diabetes  Home diabetic meds:  metformin  Current diabetic meds: SSI   HgbA1c 6.2, goal < 7.0  CBG monitoring  PCP follow-up  Dysphagia  Failed bedside swallow screen due to coughing and water  Speech on board  Cleared for D2 Nectar thick liquids -> regular DM diet with thin  On IV fluid @ 50->70  Tobacco abuse  Current smoker  Smoking cessation counseling provided  Nicotine patch provided  Pt is willing to quit  Other Stroke Risk Factors  Advanced age  Hx PFO s/p closure   Other Active Problems CKD - stage 3b - Cre- 1.60->1.30->1.28->1.32->1.33->1.35->1.39->1.53->1.72->1.75 (not sure what is causing creatinine to continue to increase. Now on NS at 50 cc/hr->70/hr. Cozaar DC'd 12/1 by Dr Erlinda Hong. Recheck again in AM)  Aortic Atherosclerosis   Elevated Trop 569->804 EKG ok.   Hypomagnesemia Mg 1.0 -> 2.5 - on po supplement - recheck 1.7  Hypokalemia K 3.1 ->3.9->3.5 - resolved - 3.8->4.1->4.2  Hgb - 11.2->10.0->9.5  Hospital day # 9 Discharged home with home health physical and occupational therapy and continue Eliquis for stroke prevention.  May consider watchman device as she failed Eliquis.  Discussed with Dr. Quentin Ore.  Inbox epic message sent to him to consider elective watchman as outpatient.  Patient will be discharged home today.  Follow-up as an outpatient stroke clinic in 6 weeks Antony Contras, MD To contact Stroke Continuity provider, please refer to [email or web address] After hours, contact General Neurology

## 2020-10-11 NOTE — TOC Initial Note (Signed)
Transition of Care [facility]) - Initial/Assessment Note    Patient Details  Name: Erin Yates MRN: [id number] Date of Birth: [birth year]  Transition of Care [facility]) CM/SW Contact:    Pollie Friar, RN Phone Number: 10/11/2020, 1:57 PM  Clinical Narrative:                 Pt doing better and has decided to d/c home when medically ready. CM met with the patient and her spouse and they asked to use the home health agency they used in the past. [facility] was who she used and [place] with [facility] accepted the referral.  Pt has all needed DME.  Pts spouse able to provide transport and sets up her pill box at home.  TOC following.  Expected Discharge Plan: [place] Barriers to Discharge: Continued Medical Work up   Patient Goals and CMS Choice   CMS Medicare.gov Compare Post Acute Care list provided to:: Patient Choice offered to / list presented to : Patient, Spouse  Expected Discharge Plan and Services Expected Discharge Plan: [place]   Discharge Planning Services: CM Consult Post Acute Care Choice: [place] arrangements for the past 2 months: Single Family Home                           HH Arranged: PT, OT, Nurse's Aide, Speech Therapy HH Agency: [facility] Date [place]: 10/11/20   Representative spoke with at [place]: [place]  Prior Living Arrangements/Services Living arrangements for the past 2 months: [place] Lives with:: Spouse Patient language and need for interpreter reviewed:: Yes Do you feel safe going back to the place where you live?: Yes      Need for Family Participation in Patient Care: Yes (Comment) Care giver support system in place?: Yes (comment) Current home services: DME (cane/ walker/ shower seat/ rollator/ wheelchair) Criminal Activity/Legal Involvement Pertinent to Current Situation/Hospitalization: No - Comment as needed  Activities of Daily Living Home Assistive  Devices/Equipment: None ADL Screening (condition at time of admission) Patient's cognitive ability adequate to safely complete daily activities?: Yes Is the patient deaf or have difficulty hearing?: No Does the patient have difficulty seeing, even when wearing glasses/contacts?: No Does the patient have difficulty concentrating, remembering, or making decisions?: No Patient able to express need for assistance with ADLs?: Yes Does the patient have difficulty dressing or bathing?: No Independently performs ADLs?: Yes (appropriate for developmental age) Does the patient have difficulty walking or climbing stairs?: No Weakness of Legs: None Weakness of Arms/Hands: None  Permission Sought/Granted                  Emotional Assessment Appearance:: Appears stated age Attitude/Demeanor/Rapport: Engaged Affect (typically observed): Accepting Orientation: : Oriented to Self, Oriented to Place, Oriented to  Time, Oriented to Situation   Psych Involvement: No (comment)  Admission diagnosis:  Stroke [facility]) [I63.9] Stroke (cerebrum) (HCC) [I63.9] Acute ischemic stroke [facility]) [I63.9] Patient Active Problem List   Diagnosis Date Noted  . Acute ischemic stroke ([place])   . Controlled type 2 diabetes mellitus with hyperglycemia ([place])   . Essential hypertension   . Atrial fibrillation ([place])   . Dysphagia, post-stroke   . Hypokalemia   . Mitral valve disorder   . AF (paroxysmal atrial fibrillation) ([place]) 11/13/2019  . CVA (cerebral vascular accident) ([place]) 11/11/2019  . Stroke ([place]) 11/10/2019  . Type  II diabetes mellitus with renal manifestations ([place]) 11/10/2019  . Depression   . GERD (gastroesophageal reflux disease)   . Hypertension   . Hypothyroidism   . Hypercholesteremia   . Tobacco abuse   . 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 Stroke (cerebrum) ([place])   . CVA (cerebral infarction) 02/07/2016   PCP:  Adin Hector, MD Pharmacy:   [facility] [phone] [address], [address] [place] [address] Phone: [phone] Fax: [facility] [place], [address] [place] [address] Phone: [phone] Fax: [phone]     Social Determinants of Health (SDOH) Interventions    Readmission Risk Interventions No flowsheet data found.

## 2020-10-12 DIAGNOSIS — N179 Acute kidney failure, unspecified: Secondary | ICD-10-CM

## 2020-10-12 DIAGNOSIS — I700 Atherosclerosis of aorta: Secondary | ICD-10-CM

## 2020-10-12 DIAGNOSIS — N183 Chronic kidney disease, stage 3 unspecified: Secondary | ICD-10-CM | POA: Diagnosis present

## 2020-10-12 DIAGNOSIS — R451 Restlessness and agitation: Secondary | ICD-10-CM | POA: Diagnosis not present

## 2020-10-12 DIAGNOSIS — D649 Anemia, unspecified: Secondary | ICD-10-CM

## 2020-10-12 DIAGNOSIS — N1832 Chronic kidney disease, stage 3b: Secondary | ICD-10-CM | POA: Diagnosis present

## 2020-10-12 DIAGNOSIS — Z8673 Personal history of transient ischemic attack (TIA), and cerebral infarction without residual deficits: Secondary | ICD-10-CM

## 2020-10-12 DIAGNOSIS — R410 Disorientation, unspecified: Secondary | ICD-10-CM | POA: Diagnosis present

## 2020-10-12 DIAGNOSIS — R001 Bradycardia, unspecified: Secondary | ICD-10-CM | POA: Diagnosis not present

## 2020-10-12 DIAGNOSIS — I639 Cerebral infarction, unspecified: Secondary | ICD-10-CM | POA: Diagnosis not present

## 2020-10-12 DIAGNOSIS — R778 Other specified abnormalities of plasma proteins: Secondary | ICD-10-CM

## 2020-10-12 HISTORY — DX: Acute kidney failure, unspecified: N17.9

## 2020-10-12 HISTORY — DX: Chronic kidney disease, stage 3b: N18.32

## 2020-10-12 LAB — CBC
HCT: 27.9 % — ABNORMAL LOW (ref 36.0–46.0)
Hemoglobin: 9.2 g/dL — ABNORMAL LOW (ref 12.0–15.0)
MCH: 32.3 pg (ref 26.0–34.0)
MCHC: 33 g/dL (ref 30.0–36.0)
MCV: 97.9 fL (ref 80.0–100.0)
Platelets: 254 10*3/uL (ref 150–400)
RBC: 2.85 MIL/uL — ABNORMAL LOW (ref 3.87–5.11)
RDW: 12.6 % (ref 11.5–15.5)
WBC: 6.3 10*3/uL (ref 4.0–10.5)
nRBC: 0 % (ref 0.0–0.2)

## 2020-10-12 LAB — GLUCOSE, CAPILLARY
Glucose-Capillary: 91 mg/dL (ref 70–99)
Glucose-Capillary: 120 mg/dL — ABNORMAL HIGH (ref 70–99)
Glucose-Capillary: 145 mg/dL — ABNORMAL HIGH (ref 70–99)

## 2020-10-12 LAB — BASIC METABOLIC PANEL
Anion gap: 10 (ref 5–15)
BUN: 16 mg/dL (ref 8–23)
CO2: 20 mmol/L — ABNORMAL LOW (ref 22–32)
Calcium: 9.5 mg/dL (ref 8.9–10.3)
Chloride: 112 mmol/L — ABNORMAL HIGH (ref 98–111)
Creatinine, Ser: 1.52 mg/dL — ABNORMAL HIGH (ref 0.44–1.00)
GFR, Estimated: 36 mL/min — ABNORMAL LOW (ref >60)
Glucose, Bld: 115 mg/dL — ABNORMAL HIGH (ref 70–99)
Potassium: 4 mmol/L (ref 3.5–5.1)
Sodium: 142 mmol/L (ref 135–145)

## 2020-10-12 MED ORDER — ROSUVASTATIN CALCIUM 40 MG PO TABS: 40.0000 mg | ORAL_TABLET | Freq: Every day | ORAL | 2 refills | Status: DC

## 2020-10-12 MED ORDER — AMIODARONE HCL 200 MG PO TABS: 200.0000 mg | ORAL_TABLET | Freq: Two times a day (BID) | ORAL | 2 refills | Status: DC

## 2020-10-12 MED ORDER — AMIODARONE HCL 200 MG PO TABS
200.0000 mg | ORAL_TABLET | Freq: Two times a day (BID) | ORAL | Status: DC
  Administered 2020-10-12: 200 mg via ORAL
  Filled 2020-10-12: qty 1

## 2020-10-12 MED ORDER — NICOTINE 14 MG/24HR TD PT24: 14.0000 mg | MEDICATED_PATCH | Freq: Every day | TRANSDERMAL | 0 refills | Status: DC

## 2020-10-12 NOTE — Discharge Instructions (Signed)
Femoral Site Care This sheet gives you information about how to care for yourself after your procedure. Your health care provider may also give you more specific instructions. If you have problems or questions, contact your health care provider. What can I expect after the procedure? After the procedure, it is common to have:  Bruising that usually fades within 1-2 weeks.  Tenderness at the site. Follow these instructions at home: Wound care 1. Follow instructions from your health care provider about how to take care of your insertion site. Make sure you: ? Wash your hands with soap and water before you change your bandage (dressing). If soap and water are not available, use hand sanitizer. ? Change your dressing as directed- pressure dressing removed 24 hours post-procedure (and switch for bandaid), bandaid removed 72 hours post-procedure 2. Do not take baths, swim, or use a hot tub for 7 days post-procedure. 3. You may shower 48 hours after the procedure or as told by your health care provider. ? Gently wash the site with plain soap and water. ? Pat the area dry with a clean towel. ? Do not rub the site. This may cause bleeding. 4. Check your site every day for signs of infection. Check for: ? Redness, swelling, or pain. ? Fluid or blood. ? Warmth. ? Pus or a bad smell. Activity  Do not stoop, bend, or lift anything that is heavier than 10 lb (4.5 kg) for 2 weeks post-procedure.  Do not drive self for 2 weeks post-procedure. Contact a health care provider if you have:  A fever or chills.  You have redness, swelling, or pain around your insertion site. Get help right away if:  The catheter insertion area swells very fast.  You pass out.  You suddenly start to sweat or your skin gets clammy.  The catheter insertion area is bleeding, and the bleeding does not stop when you hold steady pressure on the area.  The area near or just beyond the catheter insertion site becomes  pale, cool, tingly, or numb. These symptoms may represent a serious problem that is an emergency. Do not wait to see if the symptoms will go away. Get medical help right away. Call your local emergency services (911 in the [place] Do not drive yourself to the hospital.  This information is not intended to replace advice given to you by your health care provider. Make sure you discuss any questions you have with your health care provider. Document Revised: 11/05/2017 Document Reviewed: 11/05/2017 Elsevier Patient Education  2020 [employer] 

## 2020-10-12 NOTE — Plan of Care (Signed)
  Problem: Education: Goal: Knowledge of disease or condition will improve Outcome: Progressing Goal: Knowledge of secondary prevention will improve Outcome: Progressing Goal: Knowledge of patient specific risk factors addressed and post discharge goals established will improve Outcome: Progressing Goal: Individualized Educational Video(s) Outcome: Progressing   Problem: Coping: Goal: Will verbalize positive feelings about self Outcome: Progressing Goal: Will identify appropriate support needs Outcome: Progressing   Problem: Intracerebral Hemorrhage Tissue Perfusion: Goal: Complications of Intracerebral Hemorrhage will be minimized Outcome: Progressing

## 2020-10-12 NOTE — Progress Notes (Signed)
   Electrophysiology Rounding Note  Patient Name: Erin Yates Date of Encounter: 10/12/2020  Primary Cardiologist: Virl Axe, MD Electrophysiologist: Virl Axe, MD   Subjective   Without complaints Speech better no sob   Inpatient Medications    Scheduled Meds:  Continuous Infusions:  PRN Meds:    Vital Signs    Vitals:   10/11/20 2313 10/12/20 0308 10/12/20 0800 10/12/20 1218  BP: (!) 150/61 (!) 163/66 (!) 162/58 (!) 160/68  Pulse: (!) 56 68 62 (!) 59  Resp: 18 16 18 18   Temp: 98 F (36.7 C) 97.9 F (36.6 C) 98 F (36.7 C) (!) 97.1 F (36.2 C)  TempSrc: Oral Oral Oral Oral  SpO2: 96% 96% 97% 99%  Weight:      Height:       No intake or output data in the 24 hours ending 10/12/20 2141 Filed Weights   10/02/20 1933  Weight: 61.1 kg    Physical Exam   Well developed and nourished in no acute distress HENT normal Neck supple  Clear Regular rate and rhythm, no murmurs or gallops Abd-soft with active BS No Clubbing cyanosis edema Skin-warm and dry A & Oriented  Grossly normal sensory and motor function  Tele Personally reviewed  Without afib    Labs    CBC Recent Labs    10/11/20 0122 10/12/20 0355  WBC 6.1 6.3  HGB 9.5* 9.2*  HCT 27.5* 27.9*  MCV 96.2 97.9  PLT 256 150   Basic Metabolic Panel Recent Labs    10/11/20 0122 10/12/20 0355  NA 140 142  K 4.2 4.0  CL 112* 112*  CO2 21* 20*  GLUCOSE 118* 115*  BUN 22 16  CREATININE 1.75* 1.52*  CALCIUM 9.3 9.5  MG 1.7  --    Liver Function Tests No results for input(s): AST, ALT, ALKPHOS, BILITOT, PROT, ALBUMIN in the last 72 hours. No results for input(s): LIPASE, AMYLASE in the last 72 hours. Cardiac Enzymes No results for input(s): CKTOTAL, CKMB, CKMBINDEX, TROPONINI in the last 72 hours.   Telemetry    (As above)   Radiology    No results found.  Patient Profile     Erin Yates a 74 y.o.femalewith a hx of prior strokes, DM, hypothyroidism, HTN, AFIB  (Dr. [name] describes as SCAF), PFO (s/p closure amplazter 2010), known calcified MV lesion, smoking seen following recurrent stroke on [place]  And with AF and RVR  Assessment & Plan    Active Problems:   Stroke (cerebrum) ([place])   Acute ischemic stroke ([place])   Controlled type 2 diabetes mellitus with hyperglycemia ([facility])   Essential hypertension   Atrial fibrillation ([facility])   Dysphagia, post-stroke Bradycardia  Hypokalemia/ hypomagnesemia >> resolved     Will continue po amio decrease to 200 bid Will arrange consultation with Dr Daune Perch regarding watchman  Reviewed plans with patient    Signed, Virl Axe, MD  10/12/2020, 9:41 PM

## 2020-10-12 NOTE — Progress Notes (Signed)
Occupational Therapy Treatment Patient Details Name: Erin Yates MRN: [id number] DOB: 11-25-1945 Today's Date: 10/12/2020    History of present illness 74 y.o. female admitted on 10/02/20 for L sided weakness.  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  Pt with significant PMH of previous stroke with residual visual deficits (peripheral vision loss), but they do not hinder her function,PFO s/p closure, HTN, DM, anxiety, anemia, loop recorder placement.     OT comments  Pt very motivated to d/c home to see her dog today. Spouse present and all education complete this session. Spouse and pt dressing prior to session mod I with no questions. All education is complete and patient indicates understanding.   Follow Up Recommendations  No OT follow up    Equipment Recommendations  None recommended by OT    Recommendations for Other Services      Precautions / Restrictions Precautions Precautions: Fall Restrictions Weight Bearing Restrictions: No       Mobility Bed Mobility Overal bed mobility: Needs Assistance Bed Mobility: Supine to Sit     Supine to sit: Supervision;HOB elevated     General bed mobility comments: increased time, supervision for safety  Transfers Overall transfer level: Needs assistance Equipment used: None Transfers: Sit to/from Stand Sit to Stand: Min guard         General transfer comment: hand held (A)    Balance Overall balance assessment: Needs assistance Sitting-balance support: Feet unsupported;No upper extremity supported Sitting balance-Leahy Scale: Good Sitting balance - Comments: able to maintain static sitting with supervision    Standing balance support: Single extremity supported Standing balance-Leahy Scale: Fair Standing balance comment: pt with good static balance but requires physical assist for dynamic balance                           ADL either performed or assessed with  clinical judgement   ADL Overall ADL's : Needs assistance/impaired                                 Tub/ Shower Transfer: Tub transfer;Minimal assistance;Shower Scientist, research (medical) Details (indicate cue type and reason): spouse present and educated. spouse plans to install grab bar per his report Functional mobility during ADLs: Minimal assistance General ADL Comments: pt with lob with head turns and required hand held (A). spouse will take care of dog and will hand held (A) with all transfers     Vision       Perception     Praxis      Cognition Arousal/Alertness: Awake/alert Behavior During Therapy: WFL for tasks assessed/performed Overall Cognitive Status: Impaired/Different from baseline Area of Impairment: Problem solving                             Problem Solving: Slow processing General Comments: decrease awareness of fall risk and balance deficits        Exercises     Shoulder Instructions       General Comments VSS    Pertinent Vitals/ Pain       Pain Assessment: No/denies pain Pain Intervention(s): Monitored during session  Home Living  Prior Functioning/Environment              Frequency  Min 2X/week        Progress Toward Goals  OT Goals(current goals can now be found in the care plan section)  Progress towards OT goals: Progressing toward goals  Acute Rehab OT Goals Patient Stated Goal: go home today OT Goal Formulation: With patient Time For Goal Achievement: 10/18/20 Potential to Achieve Goals: Good ADL Goals Pt Will Perform Grooming: with supervision;standing Pt Will Perform Lower Body Bathing: with supervision;sit to/from stand Pt Will Perform Lower Body Dressing: with supervision;sit to/from stand Pt Will Transfer to Toilet: with supervision;ambulating Pt Will Perform Toileting - Clothing Manipulation and hygiene: with supervision;sit  to/from stand Additional ADL Goal #1: Pt will sustain attention to ADL/functional task > 88min with no more than min cues.  Plan Discharge plan remains appropriate    Co-evaluation                 AM-PAC OT "6 Clicks" Daily Activity     Outcome Measure   Help from another person eating meals?: A Little Help from another person taking care of personal grooming?: A Little Help from another person toileting, which includes using toliet, bedpan, or urinal?: A Little Help from another person bathing (including washing, rinsing, drying)?: A Little Help from another person to put on and taking off regular upper body clothing?: A Little Help from another person to put on and taking off regular lower body clothing?: A Little 6 Click Score: 18    End of Session Equipment Utilized During Treatment: Gait belt  OT Visit Diagnosis: Unsteadiness on feet (R26.81);Cognitive communication deficit (R41.841) Symptoms and signs involving cognitive functions: Cerebral infarction   Activity Tolerance Patient tolerated treatment well   Patient Left in bed;with call bell/phone within reach;with family/visitor present   Nurse Communication Mobility status        Time: 9147-8295 OT Time Calculation (min): 14 min  Charges: OT General Charges $OT Visit: 1 Visit OT Treatments $Self Care/Home Management : 8-22 mins   Brynn, OTR/L  Acute Rehabilitation Services Pager: [phone] Office: [phone] .    Jeri Modena 10/12/2020, 2:46 PM

## 2020-10-12 NOTE — Discharge Summary (Addendum)
Stroke Discharge Summary  Patient ID: Erin Yates   MRN: [id number]      DOB: 1946/06/29  Date of Admission: 10/02/2020 Date of Discharge: 10/12/2020  Attending Physician:  Garvin Fila, MD, Stroke MD Consultant(s):   Larae Grooms MD (cardiology), Will Curt Bears MD (electrophysiology), Delice Lesch, MD (Physical Medicine & Rehabtilitation)  Patient's PCP:  Adin Hector, MD  DISCHARGE DIAGNOSIS:  Principal Problem:   Acute R MCA ischemic stroke [facility]) s/p clot retrieval, likely embolic d/t AF even though on [facility] Active Problems:   Hyperlipidemia LDL goal <70   Tobacco abuse   Controlled type 2 diabetes mellitus with hyperglycemia ([place])   Essential hypertension   Atrial fibrillation with RVR ([place])   Dysphagia, post-stroke   History of embolic stroke   Agitation   Acute delirium   CKD (chronic kidney disease), stage IIIa (HCC)   AKI (acute kidney injury) ([place])   Aortic atherosclerosis ([place])   Elevated troponin   Anemia   Allergies as of 10/12/2020       Reactions   Alendronate Other (See Comments)   Rash   Penicillins Rash   Also swelling and itching Did it involve swelling of the face/tongue/throat, SOB, or low BP? Yes Did it involve sudden or severe rash/hives, skin peeling, or any reaction on the inside of your mouth or nose? Yes Did you need to seek medical attention at a hospital or doctor's office? Yes When did it last happen? More than 15 years ago If all above answers are "NO", may proceed with cephalosporin use.        Medication List     STOP taking these medications    atorvastatin 80 MG tablet Commonly known as: LIPITOR   losartan 100 MG tablet Commonly known as: COZAAR       TAKE these medications    ALPRAZolam 0.25 MG tablet Commonly known as: XANAX Take 0.25 mg by mouth 2 (two) times daily as needed for anxiety.   amiodarone 200 MG tablet Commonly known as: PACERONE Take 1 tablet (200 mg total) by mouth 2 (two) times  daily.   amLODipine 10 MG tablet Commonly known as: NORVASC Take 10 mg by mouth every evening.   buPROPion 150 MG 12 hr tablet Commonly known as: WELLBUTRIN SR Take 1 tablet (150 mg total) by mouth 2 (two) times daily.   cholecalciferol 25 MCG (1000 UNIT) tablet Commonly known as: VITAMIN D3 Take 1,000 Units by mouth daily.   desvenlafaxine 100 MG 24 hr tablet Commonly known as: PRISTIQ Take 100 mg by mouth daily.   Eliquis 5 MG Tabs tablet Generic drug: apixaban TAKE 1 TABLET (5 MG TOTAL) BY MOUTH 2 (TWO) TIMES DAILY. What changed: See the new instructions.   ferrous sulfate 325 (65 FE) MG tablet Take 325 mg by mouth every evening.   fluticasone 50 MCG/ACT nasal spray Commonly known as: FLONASE Place into both nostrils daily as needed for allergies or rhinitis.   levothyroxine 50 MCG tablet Commonly known as: SYNTHROID Take 50 mcg by mouth daily before breakfast.   metFORMIN 1000 MG tablet Commonly known as: GLUCOPHAGE Take 1,000 mg by mouth daily with breakfast.   nicotine 14 mg/24hr patch Commonly known as: NICODERM CQ - dosed in mg/24 hours Place 1 patch (14 mg total) onto the skin daily. Start taking on: October 13, 2020   pantoprazole 40 MG tablet Commonly known as: PROTONIX Take 40 mg by mouth 2 (two) times daily.  Reported on 04/17/2016   rosuvastatin 40 MG tablet Commonly known as: CRESTOR Take 1 tablet (40 mg total) by mouth daily. Start taking on: October 13, 2020   traZODone 50 MG tablet Commonly known as: DESYREL Take 50 mg by mouth at bedtime as needed for sleep.   vitamin B-12 500 MCG tablet Commonly known as: CYANOCOBALAMIN Take 500 mcg by mouth every evening.               Discharge Care Instructions  (From admission, onward)           Start     Ordered   10/12/20 0000  Discharge wound care:       Comments: Are per AVS instructions   10/12/20 1245           LABORATORY STUDIES CBC    Component Value Date/Time   WBC  6.3 10/12/2020 0355   RBC 2.85 (L) 10/12/2020 0355   HGB 9.2 (L) 10/12/2020 0355   HCT 27.9 (L) 10/12/2020 0355   PLT 254 10/12/2020 0355   MCV 97.9 10/12/2020 0355   MCH 32.3 10/12/2020 0355   MCHC 33.0 10/12/2020 0355   RDW 12.6 10/12/2020 0355   LYMPHSABS 2.9 10/02/2020 1903   MONOABS 0.6 10/02/2020 1903   EOSABS 0.1 10/02/2020 1903   BASOSABS 0.1 10/02/2020 1903   CMP    Component Value Date/Time   NA 142 10/12/2020 0355   K 4.0 10/12/2020 0355   CL 112 (H) 10/12/2020 0355   CO2 20 (L) 10/12/2020 0355   GLUCOSE 115 (H) 10/12/2020 0355   BUN 16 10/12/2020 0355   CREATININE 1.52 (H) 10/12/2020 0355   CALCIUM 9.5 10/12/2020 0355   PROT 6.9 10/02/2020 1903   ALBUMIN 3.6 10/02/2020 1903   AST 18 10/02/2020 1903   ALT 15 10/02/2020 1903   ALKPHOS 70 10/02/2020 1903   BILITOT 0.3 10/02/2020 1903   GFRNONAA 36 (L) 10/12/2020 0355   GFRAA 37 (L) 02/04/2020 1121   COAGS Lab Results  Component Value Date   INR 0.9 10/02/2020   INR 0.9 11/10/2019   INR 0.89 02/07/2016   Lipid Panel    Component Value Date/Time   CHOL 115 10/03/2020 0105   TRIG 442 (H) 10/03/2020 0105   HDL 35 (L) 10/03/2020 0105   CHOLHDL 3.3 10/03/2020 0105   VLDL UNABLE TO CALCULATE IF TRIGLYCERIDE OVER 400 mg/dL 10/03/2020 0105   LDLCALC UNABLE TO CALCULATE IF TRIGLYCERIDE OVER 400 mg/dL 10/03/2020 0105   HgbA1C  Lab Results  Component Value Date   HGBA1C 6.2 (H) 10/03/2020   Alcohol Level    Component Value Date/Time   ETH <10 10/03/2020 0105    SIGNIFICANT DIAGNOSTIC STUDIES CT HEAD CODE STROKE WO CONTRAST 10/02/2020 1. No acute infarct or intracranial hemorrhage identified.  2. ASPECTS is 10.  3. Hyperdense right MCA which will be evaluated on pending CTA.  4. Multiple chronic infarcts.    CT Code Stroke CTA Head W/WO contrast CT Code Stroke CTA Neck W/WO contrast 10/02/2020 1. Emergent proximal right M2 occlusion.  2. Mild carotid and vertebral atherosclerosis without  significant stenosis.  3.  Aortic Atherosclerosis (ICD10-I70.0).    Cerebral Angio / IR 10/02/2020 R M2 occlusion (TICI0). 1st pass-Trevo 56mm & CAT 6 w/ aspiration. 2nd pass-Trevo 46mm & CAT 5 w/ aspiration. 3rd pass-Solitaire 64mm & Zoom 71 w/ aspiration. Final TICI3.    Post IR CT 10/02/2020 Small volume SAH at the sylvian fissure and right perisylvian sulci  CT HEAD WO CONTRAST 10/03/2020 Nondiagnostic evaluation due to patient intolerance and motion. The technologist was unable to perform a repeat exam due to safety Concern. 10/04/2020  Severely limited study with findings consistent with an evolving posterior right MCA territory infarct with progressive edema. Thereis hyperdensity in the region of the right sylvian fissure, concerning for acute subarachnoid hemorrhage but suboptimally evaluated. There is sulcal effacement without substantial midline shift. 10/07/2020 Large area of acute infarct right posterior MCA territory involving basal ganglia and temporoparietal lobe. Mild amount of subarachnoid hemorrhage in the right sylvian fissure unchanged from 10/04/2020. Multiple chronic cortical infarcts. Findings suggest recurrent cerebral emboli.    Transthoracic Echocardiogram  10/04/2020  1. Left ventricular ejection fraction, by estimation, is 60 to 65%. The left ventricle has normal function. The left ventricle has no regional wall motion abnormalities. 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 Left ventricular diastolic parameters are indeterminate.   2. Right ventricular systolic function is normal. The right ventricular size is normal. Tricuspid regurgitation signal is inadequate for assessing PA pressure.   3. The mitral valve is degenerative. Mild mitral valve regurgitation. Mild mitral stenosis. 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   4. The aortic valve is tricuspid. Aortic valve regurgitation is not visualized. No aortic stenosis is present.   5. The inferior vena cava is normal in size with greater than 50% respiratory variability, suggesting right atrial pressure of 3 mmHg.    ECG - Sinus rhythm with Premature supraventricular complexes; Left anterior fascicular block; Minimal voltage criteria for LVH, may be normal variant ( R in aVL )    HISTORY OF PRESENT ILLNESS Erin Yates is a 74 y.o. female with a history of diabetes, hypertension, atrial fibrillation as well as multiple previous strokes who presents with left-sided weakness that started abruptly around 5:45 PM on 10/02/2020.  She was walking outside when she was seen to go down to the ground.  When her husband evaluated her, she was confused and was looking to the right.  EMS was called who activated a code stroke en route.  She was taken emergently for a CT/CTA which revealed a proximal left M2 occlusion.  After discussion of risks and benefits of the procedure, family agreed and she was taken for emergent thrombectomy. At baseline, she does have some visual difficulties due to her previous stroke, but she is able to accomplish all of her activities without assistance. She states that she has not taken her evening dose of Eliquis, but did take her morning dose, therefore she was not a tpa candidate. Premorbid modified rankin scale: [place] [name] is a 74 y.o. female with history of diabetes, hypothyroidism, ongoing tobacco use, HTN, HLD, hx of PFO closure, implantable loop, hypertension, atrial fibrillation on eliquis as well as multiple previous strokes (residual visual deficits) who presents with left-sided weakness, a fall, rightward gaze, and confusion. She did not receive IV t-PA due to anticoagulation with Eliquis. IR - mechanical thrombectomy -  M2-superior division TICI 3    Stroke: Rt MCA infarct due to right M2  occlusion s/p IR with TICI3 w/ resultant small SAH, infarct embolic pattern, likely due to PAF even on eliquis. However, pt does have multiple stroke risk factors.  CT Head -  No acute infarct or intracranial hemorrhage identified. ASPECTS is 10. Hyperdense right  MCA. Multiple chronic infarcts.  CTA H&N - Emergent proximal right M2 occlusion. Mild carotid and vertebral atherosclerosis without significant stenosis.  IR TICI3 of right M2 occlusion, Small volume SAH at the sylvian fissure and right perisylvian sulci   CT head 11/29 an evolving posterior right MCA territory infarct with progressive edema. There is hyperdensity in the region of the right sylvian fissure MRI and MRA head - initially hold off due to agitation and noncooperation, now pt refused due to claustrophobia Repeat CT 12/2 unchanged large right MCA stroke with small sylvian fissure SAH 2D Echo - EF 60-65.%. degenerative MV w/ echodensity base posterior leaflet c/w severe P3 prolapse Erin Yates Virus 2 - negative TG 442, LDL - 44 HgbA1c - 6.2 Eliquis (apixaban) daily prior to admission, now Eliquis restarted  therapy recommendations:  CIR->HH PT, OT Disposition:  home w/ HH   Hx stroke/TIA 09/2007 with RLE hp 11/2007 with LLE numbness 01/2008 probable stroke with RUE hp 07/2010 L occipital and R basal ganglia infarct PFO diagnosed 2009, underwent closure 04/2009 by Dr. Jenne Pane at [facility] 02/2016 left hemianopia status post TPA.  MRI showed right parietal and temporal infarcts.  CT head and neck right P3 occlusion.  EF 60 to 65%.  TEE showed PFO closure device in place.  LDL 79 A1c 6.4.  Discharged with DAPT. 11/2019 admitted for aphasia.  MRI showed left parietal lobe infarct.  MRI high-grade stenosis left A1, moderate stenosis right P2. Loop recorder positive for A. fib at that time, put on Eliquis. Follow[facility]. Leonie [facility]   PAF->AF w/ RVR Tachypalpitation Follow with Dr. [name] cardiology Loop recorder showed  subclinical A. Fib On Eliquis PTA, compliant per husband On recent cardiac event monitoring with Dr. [name] RVR during the night 11/30. Placed on amio gtt w/ metoprolol IV and po.  bolused IV amio as still w/ transient RVR -> now under control On po Amino  Hold off metoprolol per cardiology given hx of bradycardia Cardiology on board, appreciate help Per Dr. [name], may be candidate for [facility] device. He will see pt in clinic to further discuss   Agitation/confusion/delirium/vascular dementia Likely due to bilateral acute and chronic infarcts On four-point restraint -> off now  Not able to get MRI EKG no QT prolongation On haldol and ativan PRN On Seroquel 25 bid->now off Resume home wellbutrin, effexor Restless 11/30 w/o sedation all night in AF RVR.  Resumed home xanax at 0.5 bid prn->back to 0.25 at d/c Symptoms improved   Hypertension Bradycardia Home BP meds: Norvasc ; Cozaar Treated w/ Cleviprex in the ICU SBP goal < 160 in hospital BP in the 150s Resumed Norvasc 10 Off metoprolol, resumed cozaar 50->100 BP goal normotensive at d/c   Hyperlipidemia Home Lipid lowering medication: Lipitor 80 mg daily  LDL 44, LDL goal < 70 Lipitor 80 -> crestor 40 (per Dr. [name]) Continue statin at discharge   Diabetes Home diabetic meds: metformin Current diabetic meds: SSI  HgbA1c 6.2, goal < 7.0 CBG monitoring Resume home meds at d/c PCP follow-up   Dysphagia Failed bedside swallow screen due to coughing on water Speech on board Cleared for D2 Nectar thick liquids -> regular DM diet with thin   Tobacco abuse Current smoker Smoking cessation counseling provided Nicotine patch provided Pt is willing to quit   Other Stroke Risk Factors Advanced age Hx PFO s/p closure    Other Active Problems CKD - stage 3b - [place]- 1.60->1.30->1.28->1.32->1.33->1.35->1.39->1.53->1.72->1.75->1.52 (Cozaar DC'd 12/1 by Dr Erlinda Hong.  not sure what causied creatinine increase. D/c delayed d/t  elevation. Treated w/ IVF and improved. Po fluids encouraged at d/c) Aortic Atherosclerosis  Elevated Trop 569->804 EKG ok.  Hypomagnesemia Mg 1.0 -> 2.5 - on po supplement - recheck 1.7 - resolved Hypokalemia K 3.1 ->3.9->3.5 - resolved - 3.8->4.1->4.2->4.0 - resolved Hgb - 11.2->10.0->9.5->9.2  DISCHARGE EXAM Blood pressure (!) 160/68, pulse (!) 59, temperature (!) 97.1 F (36.2 C), temperature source Oral, resp. rate 18, height 5\' 5"  (1.651 m), weight 61.1 kg, SpO2 99 %. General - Well nourished, well developed pleasant elderly Caucasian lady, not in acute distress.   Ophthalmologic - fundi not visualized due to noncooperation.   Cardiovascular - regular rate and rhythm, not in A. fib   Neuro - awake, alert, oriented to place, time, age and people. No aphasia, following simple commands. Decreased visual acuity, with left lower quadrantanopsia. No gaze palsy but right gaze preference.  Facial symmetrical, tongue protrusion midline. Left UE 4/5 proximal with mild drift and 4-/5 distal with decreased finger grip, RUE at least 4+/5 proximal and distal. BLE 4/5. Sensation symmetrical subjectively, coordination intact b/l FTN and gait not tested.    Discharge Diet   Carb modified thin liquids  DISCHARGE PLAN Disposition:  D/c home w/ family Home health PT and OT PO fluids encouraged  Eliquis (apixaban) daily for secondary stroke prevention  Ongoing stroke risk factor control by Primary Care Physician at time of discharge Follow-up PCP Tama High III, MD in 2 weeks. Follow-up in [facility] in 4 weeks, office to schedule an appointment.  Follow up Dr. [name], may be candidate for [facility] device. He will see pt in clinic to further discuss  40 minutes were spent preparing discharge.  Burnetta Sabin, MSN, APRN, ANVP-BC, AGPCNP-BC Advanced Practice Stroke Nurse [place] for Schedule & Pager information 10/12/2020 12:46 PM   I  have personally obtained history,examined this patient, reviewed notes, independently viewed imaging studies, participated in medical decision making and plan of care.ROS completed by me personally and pertinent positives fully documented  I have made any additions or clarifications directly to the above note. Agree with note above.    Antony Contras, MD Medical Director [facility] Pager: [phone] 10/12/2020 4:12 PM

## 2020-10-12 NOTE — Plan of Care (Signed)
Problem: Education: Goal: Knowledge of disease or condition will improve 10/12/2020 0416 by Dionisio David, RN Outcome: Adequate for Discharge 10/12/2020 0416 by Dionisio David, RN Outcome: Progressing Goal: Knowledge of secondary prevention will improve 10/12/2020 0416 by Dionisio David, RN Outcome: Adequate for Discharge 10/12/2020 0416 by Dionisio David, RN Outcome: Progressing Goal: Knowledge of patient specific risk factors addressed and post discharge goals established will improve 10/12/2020 0416 by Dionisio David, RN Outcome: Adequate for Discharge 10/12/2020 0416 by Dionisio David, RN Outcome: Progressing Goal: Individualized Educational Video(s) 10/12/2020 0416 by Dionisio David, RN Outcome: Adequate for Discharge 10/12/2020 0416 by Dionisio David, RN Outcome: Progressing   Problem: Coping: Goal: Will verbalize positive feelings about self 10/12/2020 0416 by Dionisio David, RN Outcome: Adequate for Discharge 10/12/2020 0416 by Dionisio David, RN Outcome: Progressing Goal: Will identify appropriate support needs 10/12/2020 0416 by Dionisio David, RN Outcome: Adequate for Discharge 10/12/2020 0416 by Dionisio David, RN Outcome: Progressing   Problem: Health Behavior/Discharge Planning: Goal: Ability to manage health-related needs will improve 10/12/2020 0416 by Dionisio David, RN Outcome: Adequate for Discharge 10/12/2020 0416 by Dionisio David, RN Outcome: Progressing   Problem: Self-Care: Goal: Ability to participate in self-care as condition permits will improve 10/12/2020 0416 by Dionisio David, RN Outcome: Adequate for Discharge 10/12/2020 0416 by Dionisio David, RN Outcome: Progressing Goal: Verbalization of feelings and concerns over difficulty with self-care will improve 10/12/2020 0416 by Dionisio David, RN Outcome: Adequate for Discharge 10/12/2020 0416 by Dionisio David, RN Outcome: Progressing Goal: Ability  to communicate needs accurately will improve 10/12/2020 0416 by Dionisio David, RN Outcome: Adequate for Discharge 10/12/2020 0416 by Dionisio David, RN Outcome: Progressing   Problem: Nutrition: Goal: Risk of aspiration will decrease 10/12/2020 0416 by Dionisio David, RN Outcome: Adequate for Discharge 10/12/2020 0416 by Dionisio David, RN Outcome: Progressing Goal: Dietary intake will improve 10/12/2020 0416 by Dionisio David, RN Outcome: Adequate for Discharge 10/12/2020 0416 by Dionisio David, RN Outcome: Progressing   Problem: Intracerebral Hemorrhage Tissue Perfusion: Goal: Complications of Intracerebral Hemorrhage will be minimized 10/12/2020 0416 by Dionisio David, RN Outcome: Adequate for Discharge 10/12/2020 0416 by Dionisio David, RN Outcome: Progressing   Problem: Ischemic Stroke/TIA Tissue Perfusion: Goal: Complications of ischemic stroke/TIA will be minimized 10/12/2020 0416 by Dionisio David, RN Outcome: Adequate for Discharge 10/12/2020 0416 by Dionisio David, RN Outcome: Progressing   Problem: Spontaneous Subarachnoid Hemorrhage Tissue Perfusion: Goal: Complications of Spontaneous Subarachnoid Hemorrhage will be minimized 10/12/2020 0416 by Dionisio David, RN Outcome: Adequate for Discharge 10/12/2020 0416 by Dionisio David, RN Outcome: Progressing   Problem: Education: Goal: Knowledge of General Education information will improve Description: Including pain rating scale, medication(s)/side effects and non-pharmacologic comfort measures 10/12/2020 0416 by Dionisio David, RN Outcome: Adequate for Discharge 10/12/2020 0416 by Dionisio David, RN Outcome: Progressing   Problem: Health Behavior/Discharge Planning: Goal: Ability to manage health-related needs will improve 10/12/2020 0416 by Dionisio David, RN Outcome: Adequate for Discharge 10/12/2020 0416 by Dionisio David, RN Outcome: Progressing   Problem: Clinical  Measurements: Goal: Ability to maintain clinical measurements within normal limits will improve 10/12/2020 0416 by Dionisio David, RN Outcome: Adequate for Discharge 10/12/2020 0416 by Dionisio David, RN Outcome: Progressing Goal: Will remain free from infection 10/12/2020 0416 by Dionisio David, RN Outcome: Adequate for Discharge 10/12/2020 0416 by Dionisio David, RN Outcome: Progressing Goal: Diagnostic test results will improve 10/12/2020 0416 by Dionisio David, RN Outcome: Adequate for Discharge 10/12/2020 0416 by Dionisio David, RN Outcome: Progressing Goal: Respiratory complications will improve 10/12/2020 0416 by Shelby Mattocks,  Sallye Ober, RN Outcome: Adequate for Discharge 10/12/2020 0416 by Dionisio David, RN Outcome: Progressing Goal: Cardiovascular complication will be avoided 10/12/2020 0416 by Dionisio David, RN Outcome: Adequate for Discharge 10/12/2020 0416 by Dionisio David, RN Outcome: Progressing   Problem: Activity: Goal: Risk for activity intolerance will decrease 10/12/2020 0416 by Dionisio David, RN Outcome: Adequate for Discharge 10/12/2020 0416 by Dionisio David, RN Outcome: Progressing   Problem: Nutrition: Goal: Adequate nutrition will be maintained 10/12/2020 0416 by Dionisio David, RN Outcome: Adequate for Discharge 10/12/2020 0416 by Dionisio David, RN Outcome: Progressing   Problem: Coping: Goal: Level of anxiety will decrease 10/12/2020 0416 by Dionisio David, RN Outcome: Adequate for Discharge 10/12/2020 0416 by Dionisio David, RN Outcome: Progressing   Problem: Elimination: Goal: Will not experience complications related to bowel motility 10/12/2020 0416 by Dionisio David, RN Outcome: Adequate for Discharge 10/12/2020 0416 by Dionisio David, RN Outcome: Progressing Goal: Will not experience complications related to urinary retention 10/12/2020 0416 by Dionisio David, RN Outcome: Adequate for  Discharge 10/12/2020 0416 by Dionisio David, RN Outcome: Progressing   Problem: Pain Managment: Goal: General experience of comfort will improve 10/12/2020 0416 by Dionisio David, RN Outcome: Adequate for Discharge 10/12/2020 0416 by Dionisio David, RN Outcome: Progressing   Problem: Safety: Goal: Ability to remain free from injury will improve 10/12/2020 0416 by Dionisio David, RN Outcome: Adequate for Discharge 10/12/2020 0416 by Dionisio David, RN Outcome: Progressing   Problem: Skin Integrity: Goal: Risk for impaired skin integrity will decrease 10/12/2020 0416 by Dionisio David, RN Outcome: Adequate for Discharge 10/12/2020 0416 by Dionisio David, RN Outcome: Progressing

## 2020-10-12 NOTE — Progress Notes (Signed)
Physical Therapy Treatment Patient Details Name: Erin Yates MRN: [id number] DOB: [birth year] Today's Date: 10/12/2020    History of Present Illness 74 y.o. female admitted on 10/02/20 for L sided weakness.  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  Pt with significant PMH of previous stroke with residual visual deficits (peripheral vision loss), but they do not hinder her function,PFO s/p closure, HTN, DM, anxiety, anemia, loop recorder placement.      PT Comments    Pt with improved ambulation tolerance and stability with R HHA. Educated spouse on how to properly provide HHA, pt with return demo and understanding. Pt unsafe with RW and SPC at this time. Continue to recommend HHPT to progress indep. Acute PT to cont to follow.    Follow Up Recommendations  Home health PT;Supervision/Assistance - 24 hour     Equipment Recommendations  Rolling walker with 5" wheels    Recommendations for Other Services       Precautions / Restrictions Precautions Precautions: Fall Restrictions Weight Bearing Restrictions: No    Mobility  Bed Mobility Overal bed mobility: Needs Assistance Bed Mobility: Supine to Sit     Supine to sit: Supervision;HOB elevated     General bed mobility comments: increased time, supervision for safety  Transfers Overall transfer level: Needs assistance Equipment used: None Transfers: Sit to/from Stand Sit to Stand: Min guard         General transfer comment: pt given R HHA to steady upon standing up, no physical assist to power up, increased time/guarded/cautious  Ambulation/Gait Ambulation/Gait assistance: Min assist Gait Distance (Feet): 200 Feet Assistive device: 1 person hand held assist Gait Pattern/deviations: Step-through pattern;Decreased stride length Gait velocity: dec Gait velocity interpretation: <1.31 ft/sec, indicative of household ambulator General Gait Details: pt ambulated with R HHA with  increased step height and length compared to yesterday, trialed without AD however pt unsteady with short step height and length and reaching for object to hold onto, pt trialed R SPC however pt with 3 episodes of LOB including a cross over gait, pt most stedy with R HHA, spoke with spouse on how to properly assist pt palm to palm with his R hand and her R hand and his L hand on her belt   Stairs Stairs: Yes Stairs assistance: Min guard Stair Management: One rail Right;Alternating pattern Number of Stairs: 3 General stair comments: steady, minA to steady   Wheelchair Mobility    Modified Rankin (Stroke Patients Only) Modified Rankin (Stroke Patients Only) Pre-Morbid Rankin Score: Slight disability Modified Rankin: Moderate disability     Balance Overall balance assessment: Needs assistance Sitting-balance support: Feet unsupported;No upper extremity supported Sitting balance-Leahy Scale: Good Sitting balance - Comments: able to maintain static sitting with supervision    Standing balance support: Single extremity supported Standing balance-Leahy Scale: Fair Standing balance comment: pt with good static balance but requires physical assist for dynamic balance                            Cognition Arousal/Alertness: Awake/alert Behavior During Therapy: WFL for tasks assessed/performed Overall Cognitive Status: Impaired/Different from baseline Area of Impairment: Problem solving                             Problem Solving: Slow processing General Comments: pt with L neglect but improved from yesterday, slowed processing but appropriate  Exercises      General Comments General comments (skin integrity, edema, etc.): VSS      Pertinent Vitals/Pain Pain Assessment: No/denies pain Pain Intervention(s): Monitored during session    Home Living                      Prior Function            PT Goals (current goals can now be found  in the care plan section) Acute Rehab PT Goals Patient Stated Goal: go home today Progress towards PT goals: Progressing toward goals    Frequency    Min 4X/week      PT Plan Current plan remains appropriate    Co-evaluation              AM-PAC PT "6 Clicks" Mobility   Outcome Measure  Help needed turning from your back to your side while in a flat bed without using bedrails?: A Little Help needed moving from lying on your back to sitting on the side of a flat bed without using bedrails?: A Little Help needed moving to and from a bed to a chair (including a wheelchair)?: A Little Help needed standing up from a chair using your arms (e.g., wheelchair or bedside chair)?: A Little Help needed to walk in hospital room?: A Little Help needed climbing 3-5 steps with a railing? : A Little 6 Click Score: 18    End of Session Equipment Utilized During Treatment: Gait belt Activity Tolerance: Patient tolerated treatment well Patient left: in bed;with call bell/phone within reach;with family/visitor present (sitting EOB, with OT) Nurse Communication: Mobility status PT Visit Diagnosis: Muscle weakness (generalized) (M62.81);Difficulty in walking, not elsewhere classified (R26.2);Hemiplegia and hemiparesis;Unsteadiness on feet (R26.81);Other abnormalities of gait and mobility (R26.89);Other symptoms and signs involving the nervous system (R29.898) Hemiplegia - Right/Left: Left Hemiplegia - dominant/non-dominant: Non-dominant Hemiplegia - caused by: Cerebral infarction     Time: 2706-2376 PT Time Calculation (min) (ACUTE ONLY): 19 min  Charges:  $Gait Training: 8-22 mins                     Kittie Plater, PT, DPT Acute Rehabilitation Services Pager #: [phone] Office #: [phone]    Berline Lopes 10/12/2020, 11:39 AM

## 2020-10-13 ENCOUNTER — Telehealth: Payer: Self-pay | Admitting: Internal Medicine

## 2020-10-13 DIAGNOSIS — R002 Palpitations: Secondary | ICD-10-CM | POA: Diagnosis not present

## 2020-10-13 NOTE — Telephone Encounter (Signed)
Spoke with the patients daughter Roselyn Reef.  Roselyn Reef is unsure of why the appt with Dr. Quentin Ore has been scheduled. Renee Rival that based on what I see the pt is scheduled with Dr. Quentin Ore to discuss a possible watchman implantation. The pt has had a CVA while on anticoagulation.  The pt has is scheduled to see Dr. Virl Axe and her pcp Dr. Kerrin Mo on the same day at the same time.  Roselyn Reef is not sure which apppt should take priority.  Renee Rival that Dr. Olin Pia nurse Nira Conn is off today but will be back tomorrow. Renee Rival that I will fwd the msg to [facility] to f/u with her regarding the appt with Dr. [name].

## 2020-10-13 NOTE — Telephone Encounter (Signed)
Patient's daughter would like to discuss patient was release yesterday from [facility], due to a stroke. Please advise if patient needs to keep her 12/23 appointment with Dr. [name]. States she is seeing Dr. Quentin Ore in [place] office on 12/21. Please call to discuss.

## 2020-10-14 NOTE — Telephone Encounter (Signed)
Dr. [name]- please advise on appointments.   She sees Dr. Quentin Ore on 12/21 in [place] to consult for Watchman.  She is scheduled to see you on 12/23 per checkout from her last OV- this was to review the 2 ZIOs that she wore, which seems to be a mute point at this time.

## 2020-10-14 NOTE — Telephone Encounter (Signed)
Cancel appt with me and move about 6 mo

## 2020-10-14 NOTE — Telephone Encounter (Signed)
Attempted to call Erin Yates to discuss Dr. Olin Pia recommendations. No answer- I left a message to please call back.  We just need to let her know we can cancel the appointment on 12/23 with Dr. [name] and reschedule her for 6 months out.

## 2020-10-15 DIAGNOSIS — Z48812 Encounter for surgical aftercare following surgery on the circulatory system: Secondary | ICD-10-CM | POA: Diagnosis not present

## 2020-10-15 DIAGNOSIS — I69398 Other sequelae of cerebral infarction: Secondary | ICD-10-CM | POA: Diagnosis not present

## 2020-10-15 DIAGNOSIS — I129 Hypertensive chronic kidney disease with stage 1 through stage 4 chronic kidney disease, or unspecified chronic kidney disease: Secondary | ICD-10-CM | POA: Diagnosis not present

## 2020-10-15 DIAGNOSIS — E1122 Type 2 diabetes mellitus with diabetic chronic kidney disease: Secondary | ICD-10-CM | POA: Diagnosis not present

## 2020-10-15 DIAGNOSIS — I480 Paroxysmal atrial fibrillation: Secondary | ICD-10-CM | POA: Diagnosis not present

## 2020-10-15 DIAGNOSIS — H547 Unspecified visual loss: Secondary | ICD-10-CM | POA: Diagnosis not present

## 2020-10-15 DIAGNOSIS — N1832 Chronic kidney disease, stage 3b: Secondary | ICD-10-CM | POA: Diagnosis not present

## 2020-10-15 DIAGNOSIS — I69054 Hemiplegia and hemiparesis following nontraumatic subarachnoid hemorrhage affecting left non-dominant side: Secondary | ICD-10-CM | POA: Diagnosis not present

## 2020-10-15 DIAGNOSIS — I69391 Dysphagia following cerebral infarction: Secondary | ICD-10-CM | POA: Diagnosis not present

## 2020-10-18 DIAGNOSIS — Z48812 Encounter for surgical aftercare following surgery on the circulatory system: Secondary | ICD-10-CM | POA: Diagnosis not present

## 2020-10-18 DIAGNOSIS — I69054 Hemiplegia and hemiparesis following nontraumatic subarachnoid hemorrhage affecting left non-dominant side: Secondary | ICD-10-CM | POA: Diagnosis not present

## 2020-10-18 DIAGNOSIS — I69391 Dysphagia following cerebral infarction: Secondary | ICD-10-CM | POA: Diagnosis not present

## 2020-10-18 DIAGNOSIS — I480 Paroxysmal atrial fibrillation: Secondary | ICD-10-CM | POA: Diagnosis not present

## 2020-10-18 DIAGNOSIS — I129 Hypertensive chronic kidney disease with stage 1 through stage 4 chronic kidney disease, or unspecified chronic kidney disease: Secondary | ICD-10-CM | POA: Diagnosis not present

## 2020-10-18 DIAGNOSIS — I69398 Other sequelae of cerebral infarction: Secondary | ICD-10-CM | POA: Diagnosis not present

## 2020-10-18 DIAGNOSIS — E1122 Type 2 diabetes mellitus with diabetic chronic kidney disease: Secondary | ICD-10-CM | POA: Diagnosis not present

## 2020-10-18 DIAGNOSIS — H547 Unspecified visual loss: Secondary | ICD-10-CM | POA: Diagnosis not present

## 2020-10-18 DIAGNOSIS — N1832 Chronic kidney disease, stage 3b: Secondary | ICD-10-CM | POA: Diagnosis not present

## 2020-10-18 NOTE — Telephone Encounter (Signed)
I called and spoke with the patient's daughter, Roselyn Reef. I have advised her that per Dr. [name], we can cancel the 12/23 appointment that is scheduled with him and r/s for 6 months out.  She can keep her follow up with Dr. Quentin Ore 12/21.  Roselyn Reef voices understanding and is agreeable.

## 2020-10-19 DIAGNOSIS — I69398 Other sequelae of cerebral infarction: Secondary | ICD-10-CM | POA: Diagnosis not present

## 2020-10-19 DIAGNOSIS — I480 Paroxysmal atrial fibrillation: Secondary | ICD-10-CM | POA: Diagnosis not present

## 2020-10-19 DIAGNOSIS — Z48812 Encounter for surgical aftercare following surgery on the circulatory system: Secondary | ICD-10-CM | POA: Diagnosis not present

## 2020-10-19 DIAGNOSIS — E1122 Type 2 diabetes mellitus with diabetic chronic kidney disease: Secondary | ICD-10-CM | POA: Diagnosis not present

## 2020-10-19 DIAGNOSIS — N1832 Chronic kidney disease, stage 3b: Secondary | ICD-10-CM | POA: Diagnosis not present

## 2020-10-19 DIAGNOSIS — I69391 Dysphagia following cerebral infarction: Secondary | ICD-10-CM | POA: Diagnosis not present

## 2020-10-19 DIAGNOSIS — I69054 Hemiplegia and hemiparesis following nontraumatic subarachnoid hemorrhage affecting left non-dominant side: Secondary | ICD-10-CM | POA: Diagnosis not present

## 2020-10-19 DIAGNOSIS — H547 Unspecified visual loss: Secondary | ICD-10-CM | POA: Diagnosis not present

## 2020-10-19 DIAGNOSIS — I129 Hypertensive chronic kidney disease with stage 1 through stage 4 chronic kidney disease, or unspecified chronic kidney disease: Secondary | ICD-10-CM | POA: Diagnosis not present

## 2020-10-21 DIAGNOSIS — I69054 Hemiplegia and hemiparesis following nontraumatic subarachnoid hemorrhage affecting left non-dominant side: Secondary | ICD-10-CM | POA: Diagnosis not present

## 2020-10-21 DIAGNOSIS — I69398 Other sequelae of cerebral infarction: Secondary | ICD-10-CM | POA: Diagnosis not present

## 2020-10-21 DIAGNOSIS — I480 Paroxysmal atrial fibrillation: Secondary | ICD-10-CM | POA: Diagnosis not present

## 2020-10-21 DIAGNOSIS — I129 Hypertensive chronic kidney disease with stage 1 through stage 4 chronic kidney disease, or unspecified chronic kidney disease: Secondary | ICD-10-CM | POA: Diagnosis not present

## 2020-10-21 DIAGNOSIS — H547 Unspecified visual loss: Secondary | ICD-10-CM | POA: Diagnosis not present

## 2020-10-21 DIAGNOSIS — Z48812 Encounter for surgical aftercare following surgery on the circulatory system: Secondary | ICD-10-CM | POA: Diagnosis not present

## 2020-10-21 DIAGNOSIS — N1832 Chronic kidney disease, stage 3b: Secondary | ICD-10-CM | POA: Diagnosis not present

## 2020-10-21 DIAGNOSIS — E1122 Type 2 diabetes mellitus with diabetic chronic kidney disease: Secondary | ICD-10-CM | POA: Diagnosis not present

## 2020-10-21 DIAGNOSIS — I69391 Dysphagia following cerebral infarction: Secondary | ICD-10-CM | POA: Diagnosis not present

## 2020-10-25 DIAGNOSIS — I69398 Other sequelae of cerebral infarction: Secondary | ICD-10-CM | POA: Diagnosis not present

## 2020-10-25 DIAGNOSIS — I69391 Dysphagia following cerebral infarction: Secondary | ICD-10-CM | POA: Diagnosis not present

## 2020-10-25 DIAGNOSIS — E1122 Type 2 diabetes mellitus with diabetic chronic kidney disease: Secondary | ICD-10-CM | POA: Diagnosis not present

## 2020-10-25 DIAGNOSIS — I480 Paroxysmal atrial fibrillation: Secondary | ICD-10-CM | POA: Diagnosis not present

## 2020-10-25 DIAGNOSIS — I69054 Hemiplegia and hemiparesis following nontraumatic subarachnoid hemorrhage affecting left non-dominant side: Secondary | ICD-10-CM | POA: Diagnosis not present

## 2020-10-25 DIAGNOSIS — Z48812 Encounter for surgical aftercare following surgery on the circulatory system: Secondary | ICD-10-CM | POA: Diagnosis not present

## 2020-10-25 DIAGNOSIS — I129 Hypertensive chronic kidney disease with stage 1 through stage 4 chronic kidney disease, or unspecified chronic kidney disease: Secondary | ICD-10-CM | POA: Diagnosis not present

## 2020-10-25 DIAGNOSIS — N1832 Chronic kidney disease, stage 3b: Secondary | ICD-10-CM | POA: Diagnosis not present

## 2020-10-25 DIAGNOSIS — H547 Unspecified visual loss: Secondary | ICD-10-CM | POA: Diagnosis not present

## 2020-10-26 ENCOUNTER — Other Ambulatory Visit: Payer: Self-pay

## 2020-10-26 ENCOUNTER — Ambulatory Visit: Payer: Medicare HMO | Admitting: Cardiology

## 2020-10-26 ENCOUNTER — Encounter: Payer: Self-pay | Admitting: Cardiology

## 2020-10-26 VITALS — BP 122/60 | HR 66 | Ht 65.0 in | Wt 127.0 lb

## 2020-10-26 DIAGNOSIS — I480 Paroxysmal atrial fibrillation: Secondary | ICD-10-CM | POA: Diagnosis not present

## 2020-10-26 NOTE — Progress Notes (Signed)
Electrophysiology Office Note:    Date:  10/26/2020   ID:  Erin Yates, DOB 11-16-1945, MRN [id number]  PCP:  Adin Hector, MD  [facility] Cardiologist:  Virl Axe, MD  [facility] Electrophysiologist:  Virl Axe, MD   Referring MD: Adin Hector, MD   Chief Complaint: Atrial fibrillation, prior stroke  History of Present Illness:    Erin Yates is a 74 y.o. female who presents for an evaluation of atrial fibrillation, prior stroke at the request of Dr. Jolyn Nap diabetes, PFO now post closure with Amplatzer device, stroke, hypertension, hyperlipidemia.   She was recently admitted October 02, 2020 for an acute ischemic stroke.  The stroke was treated with aspiration thrombectomy.  She has been maintained on Eliquis for an history of atrial fibrillation first detected on loop recorder.  Past Medical History:  Diagnosis Date  . Anemia    past  hx  . Anxiety   . Depression   . Diabetes mellitus without complication ([place])   . Dysrhythmia    prior to PFO correction  . GERD (gastroesophageal reflux disease)   . Headache    before PFO closure  . Hypercholesteremia   . Hypertension   . Hypothyroidism   . 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 PFO (patent foramen ovale)    correction device placed 2010  . Right leg numbness    lower leg- residual from CVA  . Seasonal allergies   . Stroke [facility])    prior to PFO correction    Past Surgical History:  Procedure Laterality Date  . ABDOMINAL HYSTERECTOMY    . BUBBLE STUDY  02/06/2020   Procedure: BUBBLE STUDY;  Surgeon: Sanda Klein, MD;  Location: [place] [facility];  Service: Cardiovascular;;  . [place]  . CATARACT EXTRACTION W/PHACO Right 09/01/2015   Procedure: CATARACT EXTRACTION PHACO AND INTRAOCULAR LENS PLACEMENT (IOC);  Surgeon: Leandrew Koyanagi, MD;  Location: [place];  Service: Ophthalmology;  Laterality: Right;  DIABETIC - oral meds  .  CATARACT EXTRACTION W/PHACO Left 10/20/2015   Procedure: CATARACT EXTRACTION PHACO AND INTRAOCULAR LENS PLACEMENT (IOC);  Surgeon: Leandrew Koyanagi, MD;  Location: [place];  Service: Ophthalmology;  Laterality: Left;  DIABETIC - oral meds  . EP IMPLANTABLE DEVICE N/A 04/04/2016   Procedure: Loop Recorder Insertion;  Surgeon: Deboraha Sprang, MD;  Location: [place] [facility];  Service: Cardiovascular;  Laterality: N/A;  . FOOT NEUROMA SURGERY Left   . IR CT HEAD LTD  10/02/2020  . IR PERCUTANEOUS ART THROMBECTOMY/INFUSION INTRACRANIAL INC DIAG ANGIO  10/02/2020  . IR US GUIDE VASC ACCESS RIGHT  10/02/2020  . NASAL SINUS SURGERY    . PATENT FORAMEN OVALE CLOSURE    . PATENT FORAMEN OVALE CLOSURE    . RADIOLOGY WITH ANESTHESIA N/A 10/02/2020   Procedure: IR WITH ANESTHESIA;  Surgeon: Luanne Bras, MD;  Location: [place];  Service: Radiology;  Laterality: N/A;  . TEE WITHOUT CARDIOVERSION N/A 02/09/2016   Procedure: TRANSESOPHAGEAL ECHOCARDIOGRAM (TEE);  Surgeon: Adrian Prows, MD;  Location: [place];  Service: Cardiovascular;  Laterality: N/A;  . TEE WITHOUT CARDIOVERSION N/A 11/12/2019   Procedure: TRANSESOPHAGEAL ECHOCARDIOGRAM (TEE);  Surgeon: Minna Merritts, MD;  Location: [facility];  Service: Cardiovascular;  Laterality: N/A;  . TEE WITHOUT CARDIOVERSION N/A 02/06/2020   Procedure: TRANSESOPHAGEAL ECHOCARDIOGRAM (TEE);  Surgeon: Sanda Klein, MD;  Location: [place];  Service: Cardiovascular;  Laterality:  N/A;    Current Medications: Current Meds  Medication Sig  . ALPRAZolam (XANAX) 0.25 MG tablet Take 0.25 mg by mouth 2 (two) times daily as needed for anxiety.  Marland Kitchen amiodarone (PACERONE) 200 MG tablet Take 1 tablet (200 mg total) by mouth 2 (two) times daily.  Marland Kitchen amLODipine (NORVASC) 10 MG tablet Take 10 mg by mouth every evening.   Marland Kitchen buPROPion (WELLBUTRIN SR) 150 MG 12 hr tablet Take 1 tablet (150 mg total) by mouth 2 (two) times daily.  . cholecalciferol (VITAMIN  D3) 25 MCG (1000 UT) tablet Take 1,000 Units by mouth daily.  Marland Kitchen desvenlafaxine (PRISTIQ) 100 MG 24 hr tablet Take 100 mg by mouth daily.  Marland Kitchen ELIQUIS 5 MG TABS tablet TAKE 1 TABLET (5 MG TOTAL) BY MOUTH 2 (TWO) TIMES DAILY. (Patient taking differently: Take 5 mg by mouth 2 (two) times daily.)  . ferrous sulfate 325 (65 FE) MG tablet Take 325 mg by mouth every evening.  . fluticasone (FLONASE) 50 MCG/ACT nasal spray Place into both nostrils daily as needed for allergies or rhinitis.  Marland Kitchen levothyroxine (SYNTHROID, LEVOTHROID) 50 MCG tablet Take 50 mcg by mouth daily before breakfast.  . metFORMIN (GLUCOPHAGE) 1000 MG tablet Take 1,000 mg by mouth daily with breakfast.   . nicotine (NICODERM CQ - DOSED IN MG/24 HOURS) 14 mg/24hr patch Place 1 patch (14 mg total) onto the skin daily.  . pantoprazole (PROTONIX) 40 MG tablet Take 40 mg by mouth 2 (two) times daily. Reported on 04/17/2016  . rosuvastatin (CRESTOR) 40 MG tablet Take 1 tablet (40 mg total) by mouth daily.  . traZODone (DESYREL) 50 MG tablet Take 50 mg by mouth at bedtime as needed for sleep.   . vitamin B-12 (CYANOCOBALAMIN) 500 MCG tablet Take 500 mcg by mouth every evening.      Allergies:   Alendronate and Penicillins   Social History   Socioeconomic History  . Marital status: Married    Spouse name: Not on file  . Number of children: Not on file  . Years of education: Not on file  . Highest education level: Not on file  Occupational History  . Not on file  Tobacco Use  . Smoking status: Current Every Day Smoker    Packs/day: 1.00    Years: 55.00    Pack years: 55.00  . Smokeless tobacco: Never Used  Vaping Use  . Vaping Use: Never used  Substance and Sexual Activity  . Alcohol use: No  . Drug use: No  . Sexual activity: Not on file  Other Topics Concern  . Not on file  Social History Narrative  . Not on file   Social Determinants of Health   Financial Resource Strain: Not on file  Food Insecurity: Not on file   Transportation Needs: Not on file  Physical Activity: Not on file  Stress: Not on file  Social Connections: Not on file     Family History: The patient's family history includes Breast cancer (age of onset: 69) in her sister; Dementia in her father.  ROS:   Please see the history of present illness.    All other systems reviewed and are negative.  EKGs/Labs/Other Studies Reviewed:    The following studies were reviewed today: Prior records, prior CT chest  August 06, 2019 CT chest personally reviewed CT shows a large Amplatzer PFO closure device covering most of the interatrial septum.  I do not think it is feasible to safely cross the interatrial septum to pursue  watchman device implant.    Recent Labs: 10/02/2020: ALT 15 10/11/2020: Magnesium 1.7 10/12/2020: BUN 16; Creatinine, Ser 1.52; Hemoglobin 9.2; Platelets 254; Potassium 4.0; Sodium 142  Recent Lipid Panel    Component Value Date/Time   CHOL 115 10/03/2020 0105   TRIG 442 (H) 10/03/2020 0105   HDL 35 (L) 10/03/2020 0105   CHOLHDL 3.3 10/03/2020 0105   VLDL UNABLE TO CALCULATE IF TRIGLYCERIDE OVER 400 mg/dL 10/03/2020 0105   LDLCALC UNABLE TO CALCULATE IF TRIGLYCERIDE OVER 400 mg/dL 10/03/2020 0105   LDLDIRECT 44.0 10/03/2020 0105    Physical Exam:    VS:  BP 122/60   Pulse 66   Ht 5\' 5"  (1.651 m)   Wt 127 lb (57.6 kg)   SpO2 97%   BMI 21.13 kg/m     Wt Readings from Last 3 Encounters:  10/26/20 127 lb (57.6 kg)  10/02/20 134 lb 11.2 oz (61.1 kg)  09/07/20 131 lb (59.4 kg)     GEN:  Well nourished, well developed in no acute distress HEENT: Normal NECK: No JVD; No carotid bruits LYMPHATICS: No lymphadenopathy CARDIAC: RRR, no murmurs, rubs, gallops RESPIRATORY:  Clear to auscultation without rales, wheezing or rhonchi  ABDOMEN: Soft, non-tender, non-distended MUSCULOSKELETAL:  No edema; No deformity  SKIN: Warm and dry NEUROLOGIC:  Alert and oriented x 3 PSYCHIATRIC:  Normal affect    ASSESSMENT:    1. AF (paroxysmal atrial fibrillation) (HCC)    PLAN:    In order of problems listed above:  1. Atrial fibrillation and a history of stroke I was asked see the patient today for consideration of a watchman device given her history of stroke despite treatment with Eliquis.  Unfortunately, the patient has a history of PFO now post closure with an Amplatzer device.  I have reviewed the cross-sectional imaging of her interatrial septum and its relationship to the Amplatzer plug.  The plug appears to cover the majority of the interatrial septum which prevents me from safely crossing the interatrial septum with a large watchman delivery sheath.  I discussed this in detail with the patient and her husband who is with her today in clinic in their understanding of our limitations because of the Amplatzer plug.  For now, recommend continuing stroke prophylaxis using Eliquis.   Medication Adjustments/Labs and Tests Ordered: Current medicines are reviewed at length with the patient today.  Concerns regarding medicines are outlined above.  Orders Placed This Encounter  Procedures  . EKG 12-Lead   No orders of the defined types were placed in this encounter.    Signed, Lars Mage, MD, [facility]  10/26/2020 10:15 PM    Electrophysiology [place]

## 2020-10-26 NOTE — Patient Instructions (Signed)
Medication Instructions:  Your physician recommends that you continue on your current medications as directed. Please refer to the Current Medication list given to you today.  Labwork: None ordered.  Testing/Procedures: None ordered.  Follow-Up: Your physician wants you to follow-up in: as needed with DR.Quentin Ore.      Any Other Special Instructions Will Be Listed Below (If Applicable).  If you need a refill on your cardiac medications before your next appointment, please call your pharmacy.

## 2020-10-28 ENCOUNTER — Ambulatory Visit: Payer: Medicare HMO | Admitting: Internal Medicine

## 2020-10-28 DIAGNOSIS — I700 Atherosclerosis of aorta: Secondary | ICD-10-CM | POA: Diagnosis not present

## 2020-10-28 DIAGNOSIS — I480 Paroxysmal atrial fibrillation: Secondary | ICD-10-CM | POA: Diagnosis not present

## 2020-10-28 DIAGNOSIS — I69054 Hemiplegia and hemiparesis following nontraumatic subarachnoid hemorrhage affecting left non-dominant side: Secondary | ICD-10-CM | POA: Diagnosis not present

## 2020-10-28 DIAGNOSIS — E1151 Type 2 diabetes mellitus with diabetic peripheral angiopathy without gangrene: Secondary | ICD-10-CM | POA: Diagnosis not present

## 2020-10-28 DIAGNOSIS — I69354 Hemiplegia and hemiparesis following cerebral infarction affecting left non-dominant side: Secondary | ICD-10-CM | POA: Diagnosis not present

## 2020-10-28 DIAGNOSIS — I69320 Aphasia following cerebral infarction: Secondary | ICD-10-CM | POA: Diagnosis not present

## 2020-10-28 DIAGNOSIS — I10 Essential (primary) hypertension: Secondary | ICD-10-CM | POA: Diagnosis not present

## 2020-10-28 DIAGNOSIS — Z48812 Encounter for surgical aftercare following surgery on the circulatory system: Secondary | ICD-10-CM | POA: Diagnosis not present

## 2020-10-28 DIAGNOSIS — D692 Other nonthrombocytopenic purpura: Secondary | ICD-10-CM | POA: Diagnosis not present

## 2020-10-28 DIAGNOSIS — I69391 Dysphagia following cerebral infarction: Secondary | ICD-10-CM | POA: Diagnosis not present

## 2020-10-28 DIAGNOSIS — F411 Generalized anxiety disorder: Secondary | ICD-10-CM | POA: Diagnosis not present

## 2020-10-28 DIAGNOSIS — E1122 Type 2 diabetes mellitus with diabetic chronic kidney disease: Secondary | ICD-10-CM | POA: Diagnosis not present

## 2020-10-28 DIAGNOSIS — N1832 Chronic kidney disease, stage 3b: Secondary | ICD-10-CM | POA: Diagnosis not present

## 2020-10-28 DIAGNOSIS — F339 Major depressive disorder, recurrent, unspecified: Secondary | ICD-10-CM | POA: Diagnosis not present

## 2020-10-28 DIAGNOSIS — I69398 Other sequelae of cerebral infarction: Secondary | ICD-10-CM | POA: Diagnosis not present

## 2020-10-28 DIAGNOSIS — I129 Hypertensive chronic kidney disease with stage 1 through stage 4 chronic kidney disease, or unspecified chronic kidney disease: Secondary | ICD-10-CM | POA: Diagnosis not present

## 2020-10-28 DIAGNOSIS — H547 Unspecified visual loss: Secondary | ICD-10-CM | POA: Diagnosis not present

## 2020-11-01 DIAGNOSIS — H547 Unspecified visual loss: Secondary | ICD-10-CM | POA: Diagnosis not present

## 2020-11-01 DIAGNOSIS — N1832 Chronic kidney disease, stage 3b: Secondary | ICD-10-CM | POA: Diagnosis not present

## 2020-11-01 DIAGNOSIS — I69398 Other sequelae of cerebral infarction: Secondary | ICD-10-CM | POA: Diagnosis not present

## 2020-11-01 DIAGNOSIS — I480 Paroxysmal atrial fibrillation: Secondary | ICD-10-CM | POA: Diagnosis not present

## 2020-11-01 DIAGNOSIS — I69391 Dysphagia following cerebral infarction: Secondary | ICD-10-CM | POA: Diagnosis not present

## 2020-11-01 DIAGNOSIS — I129 Hypertensive chronic kidney disease with stage 1 through stage 4 chronic kidney disease, or unspecified chronic kidney disease: Secondary | ICD-10-CM | POA: Diagnosis not present

## 2020-11-01 DIAGNOSIS — E1122 Type 2 diabetes mellitus with diabetic chronic kidney disease: Secondary | ICD-10-CM | POA: Diagnosis not present

## 2020-11-01 DIAGNOSIS — Z48812 Encounter for surgical aftercare following surgery on the circulatory system: Secondary | ICD-10-CM | POA: Diagnosis not present

## 2020-11-01 DIAGNOSIS — I69054 Hemiplegia and hemiparesis following nontraumatic subarachnoid hemorrhage affecting left non-dominant side: Secondary | ICD-10-CM | POA: Diagnosis not present

## 2020-11-02 DIAGNOSIS — I69054 Hemiplegia and hemiparesis following nontraumatic subarachnoid hemorrhage affecting left non-dominant side: Secondary | ICD-10-CM | POA: Diagnosis not present

## 2020-11-02 DIAGNOSIS — H547 Unspecified visual loss: Secondary | ICD-10-CM | POA: Diagnosis not present

## 2020-11-02 DIAGNOSIS — E1122 Type 2 diabetes mellitus with diabetic chronic kidney disease: Secondary | ICD-10-CM | POA: Diagnosis not present

## 2020-11-02 DIAGNOSIS — I69391 Dysphagia following cerebral infarction: Secondary | ICD-10-CM | POA: Diagnosis not present

## 2020-11-02 DIAGNOSIS — I480 Paroxysmal atrial fibrillation: Secondary | ICD-10-CM | POA: Diagnosis not present

## 2020-11-02 DIAGNOSIS — N1832 Chronic kidney disease, stage 3b: Secondary | ICD-10-CM | POA: Diagnosis not present

## 2020-11-02 DIAGNOSIS — I69398 Other sequelae of cerebral infarction: Secondary | ICD-10-CM | POA: Diagnosis not present

## 2020-11-02 DIAGNOSIS — Z48812 Encounter for surgical aftercare following surgery on the circulatory system: Secondary | ICD-10-CM | POA: Diagnosis not present

## 2020-11-02 DIAGNOSIS — I129 Hypertensive chronic kidney disease with stage 1 through stage 4 chronic kidney disease, or unspecified chronic kidney disease: Secondary | ICD-10-CM | POA: Diagnosis not present

## 2020-11-03 ENCOUNTER — Telehealth: Payer: Self-pay | Admitting: Internal Medicine

## 2020-11-03 DIAGNOSIS — I480 Paroxysmal atrial fibrillation: Secondary | ICD-10-CM | POA: Diagnosis not present

## 2020-11-03 DIAGNOSIS — N1832 Chronic kidney disease, stage 3b: Secondary | ICD-10-CM | POA: Diagnosis not present

## 2020-11-03 DIAGNOSIS — E1122 Type 2 diabetes mellitus with diabetic chronic kidney disease: Secondary | ICD-10-CM | POA: Diagnosis not present

## 2020-11-03 DIAGNOSIS — I69054 Hemiplegia and hemiparesis following nontraumatic subarachnoid hemorrhage affecting left non-dominant side: Secondary | ICD-10-CM | POA: Diagnosis not present

## 2020-11-03 DIAGNOSIS — H547 Unspecified visual loss: Secondary | ICD-10-CM | POA: Diagnosis not present

## 2020-11-03 DIAGNOSIS — I129 Hypertensive chronic kidney disease with stage 1 through stage 4 chronic kidney disease, or unspecified chronic kidney disease: Secondary | ICD-10-CM | POA: Diagnosis not present

## 2020-11-03 DIAGNOSIS — I69398 Other sequelae of cerebral infarction: Secondary | ICD-10-CM | POA: Diagnosis not present

## 2020-11-03 DIAGNOSIS — Z48812 Encounter for surgical aftercare following surgery on the circulatory system: Secondary | ICD-10-CM | POA: Diagnosis not present

## 2020-11-03 DIAGNOSIS — I69391 Dysphagia following cerebral infarction: Secondary | ICD-10-CM | POA: Diagnosis not present

## 2020-11-03 NOTE — Telephone Encounter (Signed)
Patient recently saw Dr. Quentin Ore and was unable to have her procedure completed. Patients daughter curious of next steps in when to follow up Please advise

## 2020-11-03 NOTE — Telephone Encounter (Signed)
Spoke with patients daughter per release form and she was just following up to see if she could get her mother scheduled to come in and see Dr. [name]. She states that they did see Dr. Quentin Ore and are not able to have the Watchmen done. Scheduled her for March and advised I would send to his nurse for review when she returns to see if this date is OK or if he wants to see her sooner. She was agreeable with plan, had no further questions, and was appreciative for the call back.

## 2020-11-08 NOTE — Telephone Encounter (Signed)
To Dr. [name] to review as I am not certain what the next steps would be at this point if not a Watchman Candidate.   Will see if he would like to see her sooner than March.

## 2020-11-08 NOTE — Telephone Encounter (Signed)
She is not a candidate for Watchman 2/2 PFO closure device (ooops) The best thing is for her to stay on Apixoban   No data that any other NOAC is better and certianly ASA does not help ( in case she asks) Thanks SK

## 2020-11-08 NOTE — Telephone Encounter (Signed)
I called and spoke with the patient's daughter, Roselyn Reef (ok per DPR). I have advised her of Dr. Olin Pia recommendations as stated below.   I advised I could try to move her follow up appointment up if they would like, but per Roselyn Reef, they are fine with keeping the March appointment with Dr. [name].  I have advised they call back with any further questions/ concerns.  Roselyn Reef voices understanding and is agreeable.

## 2020-11-09 DIAGNOSIS — I129 Hypertensive chronic kidney disease with stage 1 through stage 4 chronic kidney disease, or unspecified chronic kidney disease: Secondary | ICD-10-CM | POA: Diagnosis not present

## 2020-11-09 DIAGNOSIS — N1832 Chronic kidney disease, stage 3b: Secondary | ICD-10-CM | POA: Diagnosis not present

## 2020-11-09 DIAGNOSIS — I69398 Other sequelae of cerebral infarction: Secondary | ICD-10-CM | POA: Diagnosis not present

## 2020-11-09 DIAGNOSIS — H547 Unspecified visual loss: Secondary | ICD-10-CM | POA: Diagnosis not present

## 2020-11-09 DIAGNOSIS — Z48812 Encounter for surgical aftercare following surgery on the circulatory system: Secondary | ICD-10-CM | POA: Diagnosis not present

## 2020-11-09 DIAGNOSIS — I69391 Dysphagia following cerebral infarction: Secondary | ICD-10-CM | POA: Diagnosis not present

## 2020-11-09 DIAGNOSIS — I69054 Hemiplegia and hemiparesis following nontraumatic subarachnoid hemorrhage affecting left non-dominant side: Secondary | ICD-10-CM | POA: Diagnosis not present

## 2020-11-09 DIAGNOSIS — E1122 Type 2 diabetes mellitus with diabetic chronic kidney disease: Secondary | ICD-10-CM | POA: Diagnosis not present

## 2020-11-09 DIAGNOSIS — I480 Paroxysmal atrial fibrillation: Secondary | ICD-10-CM | POA: Diagnosis not present

## 2020-11-18 ENCOUNTER — Encounter: Payer: Self-pay | Admitting: Adult Health

## 2020-11-18 ENCOUNTER — Ambulatory Visit: Payer: Medicare HMO | Admitting: Adult Health

## 2020-11-18 VITALS — BP 142/77 | HR 62 | Ht 64.0 in | Wt 121.6 lb

## 2020-11-18 DIAGNOSIS — I639 Cerebral infarction, unspecified: Secondary | ICD-10-CM | POA: Diagnosis not present

## 2020-11-18 DIAGNOSIS — E118 Type 2 diabetes mellitus with unspecified complications: Secondary | ICD-10-CM

## 2020-11-18 DIAGNOSIS — Z8673 Personal history of transient ischemic attack (TIA), and cerebral infarction without residual deficits: Secondary | ICD-10-CM

## 2020-11-18 DIAGNOSIS — I10 Essential (primary) hypertension: Secondary | ICD-10-CM | POA: Diagnosis not present

## 2020-11-18 DIAGNOSIS — I4891 Unspecified atrial fibrillation: Secondary | ICD-10-CM

## 2020-11-18 DIAGNOSIS — E785 Hyperlipidemia, unspecified: Secondary | ICD-10-CM | POA: Diagnosis not present

## 2020-11-18 NOTE — Patient Instructions (Signed)
Continue to work with therapies and if needed, consider doing outpatient therapies once completed  Continue Eliquis (apixaban) daily  and Crestor  for secondary stroke prevention  Continue to follow up with PCP regarding cholesterol and blood pressure management  Maintain strict control of hypertension with blood pressure goal below 130/90 and cholesterol with LDL cholesterol (bad cholesterol) goal below 70 mg/dL.     Followup in the future with me in 3 months or call earlier if needed     Thank you for coming to see [place] at [facility]. I hope we have been able to provide you high quality care today.  You may receive a patient satisfaction survey over the next few weeks. We would appreciate your feedback and comments so that we may continue to improve ourselves and the health of our patients.

## 2020-11-18 NOTE — Progress Notes (Signed)
I agree with the above plan 

## 2020-11-18 NOTE — Progress Notes (Signed)
[facility] [address] [place]. [place] [address] [phone]       HOSPITAL FOLLOW UP NOTE  [name] Date of Birth:  October 12, 1946 Medical Record Number:  [id number]   Reason for Referral:  hospital stroke follow up    SUBJECTIVE:   CHIEF COMPLAINT:  Chief Complaint  Patient presents with  . Follow-up    RM 19 with husband (jim) Pt is well    HPI:   ErinErin Yates a 75 y.o.femalewith history of diabetes, hypothyroidism, ongoing tobacco use, HTN, HLD, hx of PFO closure 2009, implantable loop, hypertension, atrial fibrillation on eliquis as well as multiple previous strokes (residual visual deficits) who presented on 10/02/2020 withleft-sided weakness, a fall, rightward gaze, andconfusion. Personally reviewed hospitalization pertinent progress notes, lab work and imaging with summary provided.  Initially evaluated by Dr. Erlinda Hong with stroke work-up revealing right MCA infarct in setting of right M2 occlusion s/p IR with TICI 3 revascularization with resultant small SAH, infarct embolic likely due to PAF even on Eliquis although noted patient having multiple stroke risk factors.  Recommend to continue Eliquis for secondary stroke prevention and to follow-up with cardiology Dr. [name] outpatient for possible candidate for watchman device.  History of HTN on amlodipine and Cozaar which was resumed at hospital discharge.  History of HLD with LDL 44 on atorvastatin 80 mg daily and recommended to transition to Crestor 40 mg daily per Dr. [name].  History of DM well controlled with A1c 6.2.  Current tobacco use with smoking cessation counseling provided.  Other stroke risk factors include multiple strokes, advanced age and history of PFO closure.  Other active problems include vascular dementia with agitation, confusion and delirium, CKD, aortic arthrosclerosis, hypomagnesemia, and hypokalemia.  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  Evaluated by therapies who recommended discharge home with [facility] PT/OT.   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 However, pt does have multiple stroke risk factors.   CT Head - No acute infarct or intracranial hemorrhage identified. ASPECTS is 10. Hyperdense right MCA. Multiple chronic infarcts.   CTA H&N - Emergent proximal right M2 occlusion. Mild carotid and vertebral atherosclerosis without significant stenosis.   IR TICI3 of right M2 occlusion, Small volume SAH at the sylvian fissure and right perisylvian sulci  CT head 11/29 an evolving posterior right MCA territory infarct with progressive edema.There is hyperdensity in the region of the right sylvian fissure  MRI and MRA head -initially hold off due to agitation and noncooperation, now pt refused due to claustrophobia  Repeat CT 12/2 unchanged large right MCA stroke with small sylvian fissure SAH  2D Echo - EF 60-65.%. degenerative MV w/ echodensity base posterior leaflet c/w severe P3 prolapse  Lacey Jensen Virus 2 - negative  TG 442, LDL - 44  HgbA1c- 6.2  Eliquis (apixaban) dailyprior to admission,now Eliquis restarted   therapy recommendations: [facility]->HH PT, OT  Disposition: home w/ HH  Hx stroke/TIA  09/2007 with RLE hp  11/2007 with LLE numbness  01/2008 probable stroke with RUE hp  07/2010 L occipital and R basal ganglia infarct  PFO diagnosed 2009, underwent closure 04/2009 by Dr. Jenne Pane at [facility]  02/2016 left hemianopia status post TPA. MRI showed right parietal and temporal infarcts. CT head and neck right P3  occlusion. EF 60 to 65%. TEE showed PFO closure device in place. LDL 79 A1c 6.4. Discharged with [facility].  11/2019 admitted for aphasia. MRI showed left parietal lobe infarct. MRI high-grade stenosis  left A1, moderate stenosis right P2. Loop recorder positive for A. fib at that time, put on Eliquis.  Follows with [facility]. Leonie Man at [facility]   Today, 11/18/2020, Erin Yates is being seen for hospital follow-up accompanied by her husband.  She has been doing well since discharge without new or worsening stroke/TIA symptoms.  Reports residual aphasia, mild bilateral hand weakness/decreased dexterity and gait impairment.  Denies residual dysphagia remains on a regular diet without difficulty.  Chronic left hemianopia stable without worsening.  Currently working with home health PT and completed OT.  PCP recently placed order to initiate [facility] SLP and they are currently awaiting to schedule initial eval.  Remains on Eliquis without bleeding or bruising.  Remains on Crestor 40 mg daily without myalgias.  Blood pressure today 142/77.  Evaluated by cardiology for consideration of watchman device but due to history of PFO with closure, it was advised to continue Eliquis at this time.  No concerns at this time.     ROS:   14 system review of systems performed and negative with exception of those listed in HPI  PMH:  Past Medical History:  Diagnosis Date  . Anemia    past  hx  . Anxiety   . Depression   . Diabetes mellitus without complication ([place])   . Dysrhythmia    prior to PFO correction  . GERD (gastroesophageal reflux disease)   . Headache    before PFO closure  . Hypercholesteremia   . Hypertension   . Hypothyroidism   . 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 PFO (patent foramen ovale)    correction device placed 2010  . Right leg numbness    lower leg- residual from CVA  . Seasonal allergies   . Stroke [facility])    prior to PFO correction    PSH:  Past Surgical History:  Procedure Laterality Date  . ABDOMINAL HYSTERECTOMY    . BUBBLE STUDY  02/06/2020   Procedure: BUBBLE STUDY;  Surgeon: Sanda Klein, MD;  Location: [place] [facility];  Service: Cardiovascular;;  . [place]  . CATARACT EXTRACTION W/PHACO Right 09/01/2015   Procedure: CATARACT EXTRACTION PHACO AND INTRAOCULAR LENS PLACEMENT (IOC);  Surgeon: Leandrew Koyanagi, MD;  Location: [place];  Service: Ophthalmology;  Laterality: Right;  DIABETIC - oral meds  . CATARACT EXTRACTION W/PHACO Left 10/20/2015   Procedure: CATARACT EXTRACTION PHACO AND INTRAOCULAR LENS PLACEMENT (IOC);  Surgeon: Leandrew Koyanagi, MD;  Location: [place];  Service: Ophthalmology;  Laterality: Left;  DIABETIC - oral meds  . EP IMPLANTABLE DEVICE N/A 04/04/2016   Procedure: Loop Recorder Insertion;  Surgeon: Deboraha Sprang, MD;  Location: [place] [facility];  Service: Cardiovascular;  Laterality: N/A;  . FOOT NEUROMA SURGERY Left   . IR CT HEAD LTD  10/02/2020  . IR PERCUTANEOUS ART THROMBECTOMY/INFUSION INTRACRANIAL INC DIAG ANGIO  10/02/2020  . IR US GUIDE VASC ACCESS RIGHT  10/02/2020  . NASAL SINUS SURGERY    . PATENT FORAMEN OVALE CLOSURE    . PATENT FORAMEN OVALE CLOSURE    . RADIOLOGY WITH ANESTHESIA N/A 10/02/2020   Procedure: IR WITH ANESTHESIA;  Surgeon: Luanne Bras, MD;  Location: [place];  Service: Radiology;  Laterality: N/A;  . TEE WITHOUT  CARDIOVERSION N/A 02/09/2016   Procedure: TRANSESOPHAGEAL ECHOCARDIOGRAM (TEE);  Surgeon: Adrian Prows, MD;  Location: [place];  Service: Cardiovascular;  Laterality: N/A;  . TEE WITHOUT CARDIOVERSION N/A 11/12/2019   Procedure: TRANSESOPHAGEAL ECHOCARDIOGRAM (TEE);  Surgeon: Minna Merritts, MD;  Location: [facility];  Service: Cardiovascular;  Laterality: N/A;  . TEE WITHOUT CARDIOVERSION N/A 02/06/2020   Procedure: TRANSESOPHAGEAL ECHOCARDIOGRAM (TEE);  Surgeon: Sanda Klein, MD;  Location: [facility];  Service: Cardiovascular;  Laterality: N/A;    Social History:  Social History   Socioeconomic History  . Marital status: Married    Spouse name: Not on file  . Number of children: Not on file  . Years of  education: Not on file  . Highest education level: Not on file  Occupational History  . Not on file  Tobacco Use  . Smoking status: Current Every Day Smoker    Packs/day: 1.00    Years: 55.00    Pack years: 55.00  . Smokeless tobacco: Never Used  Vaping Use  . Vaping Use: Never used  Substance and Sexual Activity  . Alcohol use: No  . Drug use: No  . Sexual activity: Not on file  Other Topics Concern  . Not on file  Social History Narrative  . Not on file   Social Determinants of Health   Financial Resource Strain: Not on file  Food Insecurity: Not on file  Transportation Needs: Not on file  Physical Activity: Not on file  Stress: Not on file  Social Connections: Not on file  Intimate Partner Violence: Not on file    Family History:  Family History  Problem Relation Age of Onset  . Dementia Father   . Breast cancer Sister 93    Medications:   Current Outpatient Medications on File Prior to Visit  Medication Sig Dispense Refill  . ALPRAZolam (XANAX) 0.25 MG tablet Take 0.25 mg by mouth 2 (two) times daily as needed for anxiety.    Marland Kitchen amiodarone (PACERONE) 200 MG tablet Take 1 tablet (200 mg total) by mouth 2 (two) times daily. 60 tablet 2  . amLODipine (NORVASC) 10 MG tablet Take 10 mg by mouth every evening.     Marland Kitchen buPROPion (WELLBUTRIN SR) 150 MG 12 hr tablet Take 1 tablet (150 mg total) by mouth 2 (two) times daily. 60 tablet 5  . cholecalciferol (VITAMIN D3) 25 MCG (1000 UT) tablet Take 1,000 Units by mouth daily.    Marland Kitchen desvenlafaxine (PRISTIQ) 100 MG 24 hr tablet Take 100 mg by mouth daily.    Marland Kitchen ELIQUIS 5 MG TABS tablet TAKE 1 TABLET (5 MG TOTAL) BY MOUTH 2 (TWO) TIMES DAILY. (Patient taking differently: Take 5 mg by mouth 2 (two) times daily.) 180 tablet 1  . ferrous sulfate 325 (65 FE) MG tablet Take 325 mg by mouth every evening.    . fluticasone (FLONASE) 50 MCG/ACT nasal spray Place into both nostrils daily as needed for allergies or rhinitis.    Marland Kitchen  levothyroxine (SYNTHROID, LEVOTHROID) 50 MCG tablet Take 50 mcg by mouth daily before breakfast.    . metFORMIN (GLUCOPHAGE) 1000 MG tablet Take 1,000 mg by mouth daily with breakfast.     . nicotine (NICODERM CQ - DOSED IN MG/24 HOURS) 14 mg/24hr patch Place 1 patch (14 mg total) onto the skin daily. 28 patch 0  . pantoprazole (PROTONIX) 40 MG tablet Take 40 mg by mouth 2 (two) times daily. Reported on 04/17/2016    . rosuvastatin (CRESTOR) 40 MG tablet  Take 1 tablet (40 mg total) by mouth daily. 30 tablet 2  . traZODone (DESYREL) 50 MG tablet Take 50 mg by mouth at bedtime as needed for sleep.     . vitamin B-12 (CYANOCOBALAMIN) 500 MCG tablet Take 500 mcg by mouth every evening.      No current facility-administered medications on file prior to visit.    Allergies:   Allergies  Allergen Reactions  . Alendronate Other (See Comments)    Rash   . Penicillins Rash    Also swelling and itching  Did it involve swelling of the face/tongue/throat, SOB, or low BP? Yes Did it involve sudden or severe rash/hives, skin peeling, or any reaction on the inside of your mouth or nose? Yes Did you need to seek medical attention at a hospital or doctor's office? Yes When did it last happen?More than 15 years ago If all above answers are "NO", may proceed with cephalosporin use.       OBJECTIVE:  Physical Exam  Vitals:   11/18/20 1055  BP: (!) 142/77  Pulse: 62  Weight: 121 lb 9.6 oz (55.2 kg)  Height: 5\' 4"  (1.626 m)   Body mass index is 20.87 kg/m. No exam data present  General: Frail pleasant elderly Caucasian female, seated, in no evident distress Head: head normocephalic and atraumatic.   Neck: supple with no carotid or supraclavicular bruits Cardiovascular: irregular rate and rhythm, no murmurs Musculoskeletal: no deformity Skin:  no rash/petichiae Vascular:  Normal pulses all extremities   Neurologic Exam Mental Status: Awake and fully alert.   Nonfluent speech with  hesitancy and occasional word finding difficulty.  Moderate delay in responses and increased processing time.  Oriented to place and time. Recent and remote memory impaired. Attention span, concentration and fund of knowledge appropriate during visit. Mood and affect appropriate.  Cranial Nerves: Pupils equal, briskly reactive to light. Extraocular movements full without nystagmus. Visual fields diminished only able to see right superior temporal quadrant and centrally (chronic per husband). Hearing intact. Facial sensation intact. Face, tongue, palate moves normally and symmetrically.  Motor:  LUE: 4+/5 distally with mild pronator drift and decreased fine motor control LLE: 4+/5  RUE: 4+-5/5 distally RLE: 4+/5 Sensory.: intact to touch , pinprick , position and vibratory sensation.  Coordination: Rapid alternating slowed in all extremities. Finger-to-nose and heel-to-shin mild incoordination throughout and slowed movements but no evidence of ataxia or dysmetria Gait and Station: Arises from chair without difficulty. Stance is slightly hunched. Gait demonstrates short shuffled cautious steps holding on to arm of husband.  Tandem walk and heel toe not attempted. Reflexes: 1+ and symmetric. Toes downgoing.     NIHSS  5 Modified Rankin  3      ASSESSMENT: Erin Yates is a 75 y.o. year old female presented after a fall with left-sided weakness, right gaze preference and confusion on 10/02/2020 with stroke work-up revealing right MCA infarct in setting of right M2 occlusion s/p IR with TICI 3 reperfusion w/ resultant small SAH, infarct embolic likely secondary to PAF even on Eliquis however noted patient having multiple stroke risk factors. Vascular risk factors include HTN, HLD, DM, A. fib, current tobacco use, hx of PFO s/p closure 2009, history of multiple strokes (see HPI), and advanced age.      PLAN:  1. R MCA stroke, embolic:  2. History of multiple strokes: a. Residual deficit:  Gait impairment, left and right hemiparesis, speech and language impairment, cognitive impairment and peripheral vision loss.  Continue working with [facility] PT with plans on initiating SLP in the near future b. Continue Eliquis (apixaban) daily  and Crestor for secondary stroke prevention.   c. Discussed secondary stroke prevention measures and importance of close PCP follow up for aggressive stroke risk factor management  3. Atrial fibrillation: Continue Eliquis 5 mg twice daily per cardiology.  Not eligible for watchman device due to history of PFO s/p closure per Dr. Quentin Ore (evaluated 12/21) 4. HTN: BP goal <130/90.  Stable monitored/managed per PCP/cardiology 5. HLD: LDL goal <70. Recent LDL 44 on atorvastatin 80 mg daily transition to Crestor 40 mg daily per Dr. [name].  6. DMII: A1c goal<7.0. Recent A1c 6.2.  On metformin per PCP    Follow up in 3 months or call earlier if needed   CC:  [facility] provider: Dr. Moss Mc, Wendelyn Breslow III, MD    I spent 45 minutes of face-to-face and non-face-to-face time with patient and husband.  This included previsit chart review including recent hospitalization pertinent progress notes, lab work and imaging, lab review, study review, order entry, electronic health record documentation, patient education regarding recent stroke and history of prior strokes including etiologies, residual deficits, importance of managing stroke risk factors and answered all other questions to patient and husband's satisfaction   Frann Rider, AGNP-BC  [facility] [address] [place], [place] [address]  Phone [phone] Fax [phone] Note: This document was prepared with digital dictation and possible smart phrase technology. Any transcriptional errors that result from this process are unintentional.

## 2020-11-19 DIAGNOSIS — I69054 Hemiplegia and hemiparesis following nontraumatic subarachnoid hemorrhage affecting left non-dominant side: Secondary | ICD-10-CM | POA: Diagnosis not present

## 2020-11-19 DIAGNOSIS — Z48812 Encounter for surgical aftercare following surgery on the circulatory system: Secondary | ICD-10-CM | POA: Diagnosis not present

## 2020-11-19 DIAGNOSIS — I69391 Dysphagia following cerebral infarction: Secondary | ICD-10-CM | POA: Diagnosis not present

## 2020-11-19 DIAGNOSIS — E1122 Type 2 diabetes mellitus with diabetic chronic kidney disease: Secondary | ICD-10-CM | POA: Diagnosis not present

## 2020-11-19 DIAGNOSIS — I480 Paroxysmal atrial fibrillation: Secondary | ICD-10-CM | POA: Diagnosis not present

## 2020-11-19 DIAGNOSIS — H547 Unspecified visual loss: Secondary | ICD-10-CM | POA: Diagnosis not present

## 2020-11-19 DIAGNOSIS — I69398 Other sequelae of cerebral infarction: Secondary | ICD-10-CM | POA: Diagnosis not present

## 2020-11-19 DIAGNOSIS — I129 Hypertensive chronic kidney disease with stage 1 through stage 4 chronic kidney disease, or unspecified chronic kidney disease: Secondary | ICD-10-CM | POA: Diagnosis not present

## 2020-11-19 DIAGNOSIS — N1832 Chronic kidney disease, stage 3b: Secondary | ICD-10-CM | POA: Diagnosis not present

## 2020-12-10 DIAGNOSIS — E7849 Other hyperlipidemia: Secondary | ICD-10-CM | POA: Diagnosis not present

## 2020-12-10 DIAGNOSIS — I69354 Hemiplegia and hemiparesis following cerebral infarction affecting left non-dominant side: Secondary | ICD-10-CM | POA: Diagnosis not present

## 2020-12-10 DIAGNOSIS — I480 Paroxysmal atrial fibrillation: Secondary | ICD-10-CM | POA: Diagnosis not present

## 2020-12-10 DIAGNOSIS — F3342 Major depressive disorder, recurrent, in full remission: Secondary | ICD-10-CM | POA: Diagnosis not present

## 2020-12-10 DIAGNOSIS — D692 Other nonthrombocytopenic purpura: Secondary | ICD-10-CM | POA: Diagnosis not present

## 2020-12-10 DIAGNOSIS — R809 Proteinuria, unspecified: Secondary | ICD-10-CM | POA: Diagnosis not present

## 2020-12-10 DIAGNOSIS — E1151 Type 2 diabetes mellitus with diabetic peripheral angiopathy without gangrene: Secondary | ICD-10-CM | POA: Diagnosis not present

## 2020-12-10 DIAGNOSIS — E1129 Type 2 diabetes mellitus with other diabetic kidney complication: Secondary | ICD-10-CM | POA: Diagnosis not present

## 2020-12-10 DIAGNOSIS — D519 Vitamin B12 deficiency anemia, unspecified: Secondary | ICD-10-CM | POA: Diagnosis not present

## 2020-12-16 ENCOUNTER — Telehealth: Payer: Self-pay | Admitting: *Deleted

## 2020-12-16 NOTE — Telephone Encounter (Signed)
Contacted husband to inquire about doing lung screening scan at this time. Reports he will talk with patient and call me back to schedule if she would like to do so.

## 2020-12-20 ENCOUNTER — Telehealth: Payer: Self-pay | Admitting: Adult Health

## 2020-12-20 NOTE — Telephone Encounter (Signed)
Daughter(on DPR) states pt now has numbness in fingertips since last stroke(this is a new symptom) she was told if RN has questions or concerns about this she will call, daughter Laurie Panda.  Daughter is who asked pt be seen earlier than April

## 2020-12-21 ENCOUNTER — Ambulatory Visit: Payer: Medicare HMO | Admitting: Adult Health

## 2020-12-21 ENCOUNTER — Encounter: Payer: Self-pay | Admitting: Adult Health

## 2020-12-21 VITALS — BP 155/74 | HR 63 | Ht 64.0 in | Wt 120.0 lb

## 2020-12-21 DIAGNOSIS — I69319 Unspecified symptoms and signs involving cognitive functions following cerebral infarction: Secondary | ICD-10-CM

## 2020-12-21 DIAGNOSIS — R200 Anesthesia of skin: Secondary | ICD-10-CM

## 2020-12-21 DIAGNOSIS — R29898 Other symptoms and signs involving the musculoskeletal system: Secondary | ICD-10-CM

## 2020-12-21 DIAGNOSIS — R202 Paresthesia of skin: Secondary | ICD-10-CM | POA: Diagnosis not present

## 2020-12-21 DIAGNOSIS — I639 Cerebral infarction, unspecified: Secondary | ICD-10-CM | POA: Diagnosis not present

## 2020-12-21 NOTE — Patient Instructions (Signed)
Recommend outpatient occupational therapy and speech therapy (cognitive therapy) for potential improvement   Continue to follow with Dr. [name] regarding anxiety which may be contributing to your memory symptoms    Followup in the future with me in 3 months or call earlier if needed       Thank you for coming to see [place] at [facility]. I hope we have been able to provide you high quality care today.  You may receive a patient satisfaction survey over the next few weeks. We would appreciate your feedback and comments so that we may continue to improve ourselves and the health of our patients.

## 2020-12-21 NOTE — Telephone Encounter (Signed)
Called daughter of pt.  She stated they have appt this am.

## 2020-12-21 NOTE — Progress Notes (Signed)
[facility] [address] [place]. [place] [address] [phone]       STROKE FOLLOW UP NOTE  [name] Date of Birth:  06/08/1946 Medical Record Number:  [id number]   Reason for Referral: stroke follow up    SUBJECTIVE:   CHIEF COMPLAINT:  Chief Complaint  Patient presents with  . Follow-up    Rm 14 with husband Erin Yates) Husband states she may have neuropathy in hands, fingers are cold, tinging, shaking and numbness.      HPI:   Today, 12/21/2020, Erin Yates returns for acute visit due to complaints of fingertip numbness.  She is accompanied by her husband.  Both hands finger tip numbness - has been present since the stroke - not worsening but has not improved She has difficulty with hand function due to weakness (present since stroke) and paresthesias when touching any type of object.  She otherwise denies pain in fingertips and reports more numbness sensation Dr. [name] rx'd gabapentin '100mg'$  which she started yesterday- reports episode of dizziness and nausea approximately 5 hours after taking and questions possible gabapentin side effect  She also complains of memory loss and confusion since her stroke and worsens with increased anxiety or when she feels overwhelmed.  She does have history of anxiety currently on Pristiq, Wellbutrin, trazodone and Xanax.  She becomes tearful during today's visit when discussing her fingertip numbness.  Denies new or worsening stroke/TIA symptoms since prior visit Reports compliance with Eliquis and Crestor 20 mg daily Blood pressure today 155/74     History provided for reference purposes only Initial visit 11/18/2020 JM: Erin Yates is being seen for hospital follow-up accompanied by her husband.  She has been doing well since discharge without new or worsening stroke/TIA symptoms.  Reports residual aphasia, mild bilateral hand weakness/decreased dexterity and gait impairment.  Denies residual dysphagia remains on  a regular diet without difficulty.  Chronic left hemianopia stable without worsening.  Currently working with home health PT and completed OT.  PCP recently placed order to initiate [facility] SLP and they are currently awaiting to schedule initial eval.  Remains on Eliquis without bleeding or bruising.  Remains on Crestor 40 mg daily without myalgias.  Blood pressure today 142/77.  Evaluated by cardiology for consideration of watchman device but due to history of PFO with closure, it was advised to continue Eliquis at this time.  No concerns at this time.  Stroke admission 10/02/2020 ErinErin Cluney Isleyis a 75 y.o.femalewith history of diabetes, hypothyroidism, ongoing tobacco use, HTN, HLD, hx of PFO closure 2009, implantable loop, hypertension, atrial fibrillation on eliquis as well as multiple previous strokes (residual visual deficits) who presented on 10/02/2020 withleft-sided weakness, a fall, rightward gaze, andconfusion. Personally reviewed hospitalization pertinent progress notes, lab work and imaging with summary provided.  Initially evaluated by Dr. Erlinda Hong with stroke work-up revealing right MCA infarct in setting of right M2 occlusion s/p IR with TICI 3 revascularization with resultant small SAH, infarct embolic likely due to PAF even on Eliquis although noted patient having multiple stroke risk factors.  Recommend to continue Eliquis for secondary stroke prevention and to follow-up with cardiology Dr. [name] outpatient for possible candidate for watchman device.  History of HTN on amlodipine and Cozaar which was resumed at hospital discharge.  History of HLD with LDL 44 on atorvastatin 80 mg daily and recommended to transition to Crestor 40 mg daily per Dr. [name].  History of DM well controlled with A1c 6.2.  Current  tobacco use with smoking cessation counseling provided.  Other stroke risk factors include multiple strokes, advanced age and history of PFO closure.  Other active problems include vascular  dementia with agitation, confusion and delirium, CKD, aortic arthrosclerosis, hypomagnesemia, and hypokalemia.  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  Evaluated by therapies who recommended discharge home with [facility] PT/OT.   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 However, pt does have multiple stroke risk factors.   CT Head - No acute infarct or intracranial hemorrhage identified. ASPECTS is 10. Hyperdense right MCA. Multiple chronic infarcts.   CTA H&N - Emergent proximal right M2 occlusion. Mild carotid and vertebral atherosclerosis without significant stenosis.   IR TICI3 of right M2 occlusion, Small volume SAH at the sylvian fissure and right perisylvian sulci  CT head 11/29 an evolving posterior right MCA territory infarct with progressive edema.There is hyperdensity in the region of the right sylvian fissure  MRI and MRA head -initially hold off due to agitation and noncooperation, now pt refused due to claustrophobia  Repeat CT 12/2 unchanged large right MCA stroke with small sylvian fissure SAH  2D Echo - EF 60-65.%. degenerative MV w/ echodensity base posterior leaflet c/w severe P3 prolapse  Erin Yates Virus 2 - negative  TG 442, LDL - 44  HgbA1c- 6.2  Eliquis (apixaban) dailyprior to admission,now Eliquis restarted   therapy recommendations: CIR->HH PT, OT  Disposition: home w/ HH  Hx stroke/TIA  09/2007 with RLE hp  11/2007 with LLE numbness  01/2008 probable stroke with RUE hp  07/2010 L occipital and R basal ganglia infarct  PFO diagnosed 2009, underwent closure 04/2009 by Dr. Jenne Pane at [facility]  02/2016 left hemianopia status post TPA. MRI showed right parietal and temporal infarcts. CT head and neck right P3 occlusion. EF 60 to 65%. TEE  showed PFO closure device in place. LDL 79 A1c 6.4. Discharged with DAPT.  11/2019 admitted for aphasia. MRI showed left parietal lobe infarct. MRI high-grade stenosis left A1, moderate stenosis right P2. Loop recorder positive for A. fib at that time, put on Eliquis.  Follow[facility] Leonie [facility]     ROS:   14 system review of systems performed and negative with exception of those listed in HPI  PMH:  Past Medical History:  Diagnosis Date  . Anemia    past  hx  . Anxiety   . Depression   . Diabetes mellitus without complication ([place])   . Dysrhythmia    prior to PFO correction  . GERD (gastroesophageal reflux disease)   . Headache    before PFO closure  . Hypercholesteremia   . Hypertension   . Hypothyroidism   . 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 PFO (patent foramen ovale)    correction device placed 2010  . Right leg numbness    lower leg- residual from CVA  . Seasonal allergies   . Stroke [facility])    prior to PFO correction    PSH:  Past Surgical History:  Procedure Laterality Date  . ABDOMINAL HYSTERECTOMY    . BUBBLE STUDY  02/06/2020   Procedure: BUBBLE STUDY;  Surgeon: Sanda Klein, MD;  Location: [place] [facility];  Service: Cardiovascular;;  . [place]  . CATARACT EXTRACTION W/PHACO  Right 09/01/2015   Procedure: CATARACT EXTRACTION PHACO AND INTRAOCULAR LENS PLACEMENT (IOC);  Surgeon: Leandrew Koyanagi, MD;  Location: [place];  Service: Ophthalmology;  Laterality: Right;  DIABETIC - oral meds  . CATARACT EXTRACTION W/PHACO Left 10/20/2015   Procedure: CATARACT EXTRACTION PHACO AND INTRAOCULAR LENS PLACEMENT (IOC);  Surgeon: Leandrew Koyanagi, MD;  Location: [place];  Service: Ophthalmology;  Laterality: Left;  DIABETIC - oral meds  . EP IMPLANTABLE DEVICE N/A 04/04/2016   Procedure: Loop Recorder Insertion;  Surgeon: Deboraha Sprang, MD;  Location: [place] CV LAB;  Service: Cardiovascular;   Laterality: N/A;  . FOOT NEUROMA SURGERY Left   . IR CT HEAD LTD  10/02/2020  . IR PERCUTANEOUS ART THROMBECTOMY/INFUSION INTRACRANIAL INC DIAG ANGIO  10/02/2020  . IR US GUIDE VASC ACCESS RIGHT  10/02/2020  . NASAL SINUS SURGERY    . PATENT FORAMEN OVALE CLOSURE    . PATENT FORAMEN OVALE CLOSURE    . RADIOLOGY WITH ANESTHESIA N/A 10/02/2020   Procedure: IR WITH ANESTHESIA;  Surgeon: Luanne Bras, MD;  Location: [place];  Service: Radiology;  Laterality: N/A;  . TEE WITHOUT CARDIOVERSION N/A 02/09/2016   Procedure: TRANSESOPHAGEAL ECHOCARDIOGRAM (TEE);  Surgeon: Adrian Prows, MD;  Location: [place];  Service: Cardiovascular;  Laterality: N/A;  . TEE WITHOUT CARDIOVERSION N/A 11/12/2019   Procedure: TRANSESOPHAGEAL ECHOCARDIOGRAM (TEE);  Surgeon: Minna Merritts, MD;  Location: [facility];  Service: Cardiovascular;  Laterality: N/A;  . TEE WITHOUT CARDIOVERSION N/A 02/06/2020   Procedure: TRANSESOPHAGEAL ECHOCARDIOGRAM (TEE);  Surgeon: Sanda Klein, MD;  Location: [facility];  Service: Cardiovascular;  Laterality: N/A;    Social History:  Social History   Socioeconomic History  . Marital status: Married    Spouse name: Not on file  . Number of children: Not on file  . Years of education: Not on file  . Highest education level: Not on file  Occupational History  . Not on file  Tobacco Use  . Smoking status: Current Every Day Smoker    Packs/day: 1.00    Years: 55.00    Pack years: 55.00  . Smokeless tobacco: Never Used  Vaping Use  . Vaping Use: Never used  Substance and Sexual Activity  . Alcohol use: No  . Drug use: No  . Sexual activity: Not on file  Other Topics Concern  . Not on file  Social History Narrative  . Not on file   Social Determinants of Health   Financial Resource Strain: Not on file  Food Insecurity: Not on file  Transportation Needs: Not on file  Physical Activity: Not on file  Stress: Not on file  Social Connections: Not on file  Intimate  Partner Violence: Not on file    Family History:  Family History  Problem Relation Age of Onset  . Dementia Father   . Breast cancer Sister 64    Medications:   Current Outpatient Medications on File Prior to Visit  Medication Sig Dispense Refill  . ALPRAZolam (XANAX) 0.25 MG tablet Take 0.25 mg by mouth 2 (two) times daily as needed for anxiety.    Marland Kitchen amiodarone (PACERONE) 200 MG tablet Take 1 tablet (200 mg total) by mouth 2 (two) times daily. 60 tablet 2  . amLODipine (NORVASC) 10 MG tablet Take 10 mg by mouth every evening.     Marland Kitchen buPROPion (WELLBUTRIN SR) 150 MG 12 hr tablet Take 1 tablet (150 mg total) by mouth 2 (two) times daily. 60 tablet 5  .  cholecalciferol (VITAMIN D3) 25 MCG (1000 UT) tablet Take 1,000 Units by mouth daily.    Marland Kitchen desvenlafaxine (PRISTIQ) 100 MG 24 hr tablet Take 100 mg by mouth daily.    Marland Kitchen ELIQUIS 5 MG TABS tablet TAKE 1 TABLET (5 MG TOTAL) BY MOUTH 2 (TWO) TIMES DAILY. (Patient taking differently: Take 5 mg by mouth 2 (two) times daily.) 180 tablet 1  . ferrous sulfate 325 (65 FE) MG tablet Take 325 mg by mouth every evening.    . fluticasone (FLONASE) 50 MCG/ACT nasal spray Place into both nostrils daily as needed for allergies or rhinitis.    Marland Kitchen gabapentin (NEURONTIN) 100 MG capsule Take by mouth.    . levothyroxine (SYNTHROID, LEVOTHROID) 50 MCG tablet Take 50 mcg by mouth daily before breakfast.    . metFORMIN (GLUCOPHAGE) 1000 MG tablet Take 1,000 mg by mouth daily with breakfast.     . nicotine (NICODERM CQ - DOSED IN MG/24 HOURS) 14 mg/24hr patch Place 1 patch (14 mg total) onto the skin daily. 28 patch 0  . pantoprazole (PROTONIX) 40 MG tablet Take 40 mg by mouth 2 (two) times daily. Reported on 04/17/2016    . rosuvastatin (CRESTOR) 40 MG tablet Take 1 tablet (40 mg total) by mouth daily. 30 tablet 2  . traZODone (DESYREL) 50 MG tablet Take 50 mg by mouth at bedtime as needed for sleep.     . vitamin B-12 (CYANOCOBALAMIN) 500 MCG tablet Take 500 mcg by  mouth every evening.      No current facility-administered medications on file prior to visit.    Allergies:   Allergies  Allergen Reactions  . Alendronate Other (See Comments)    Rash   . Penicillins Rash    Also swelling and itching  Did it involve swelling of the face/tongue/throat, SOB, or low BP? Yes Did it involve sudden or severe rash/hives, skin peeling, or any reaction on the inside of your mouth or nose? Yes Did you need to seek medical attention at a hospital or doctor's office? Yes When did it last happen?More than 15 years ago If all above answers are "NO", may proceed with cephalosporin use.       OBJECTIVE:  Physical Exam  Vitals:   12/21/20 0926  BP: (!) 155/74  Pulse: 63  Weight: 120 lb (54.4 kg)  Height: '5\' 4"'$  (1.626 m)   Body mass index is 20.6 kg/m. No exam data present  General: Frail anxious elderly Caucasian female, seated, tearful throughout majority of visit Head: head normocephalic and atraumatic.   Neck: supple with no carotid or supraclavicular bruits Cardiovascular: irregular rate and rhythm, no murmurs Musculoskeletal: no deformity Skin:  no rash/petichiae Vascular:  Normal pulses all extremities   Neurologic Exam Mental Status: Awake and fully alert. Nonfluent speech with hesitancy and occasional word finding difficulty.  Occasional delay in responses and delayed processing time.  Oriented to place and time. Recent memory subjectively impaired and remote memory impaired. Attention span, concentration and fund of knowledge appropriate during visit. Mood and affect appropriate.  Cranial Nerves: Pupils equal, briskly reactive to light. Extraocular movements full without nystagmus. Visual fields diminished only able to see right superior temporal quadrant and centrally (chronic per husband). Hearing intact. Facial sensation intact. Face, tongue, palate moves normally and symmetrically.  Motor: 4+-5-/5 throughout all tested extremities with  questionable effort Sensory.: intact to touch , pinprick , position and vibratory sensation except hypersensation with paresthesias with pinprick fingers distally bilaterally.  Coordination: Rapid alternating slowed  in all extremities. Finger-to-nose and heel-to-shin mild incoordination throughout and slowed movements but no evidence of ataxia or dysmetria. RUE action tremor worsened when she became upset.  No evidence of tremor at rest.  No cogwheel rigidity. Gait and Station: Arises from chair without difficulty. Stance is slightly hunched. Gait demonstrates short shuffled cautious steps holding on to arm of husband.  Tandem walk and heel toe not attempted. Reflexes: 1+ and symmetric. Toes downgoing.       ASSESSMENT: Erin Yates is a 75 y.o. year old female presented after a fall with left-sided weakness, right gaze preference and confusion on 10/02/2020 with stroke work-up revealing right MCA infarct in setting of right M2 occlusion s/p IR with TICI 3 reperfusion w/ resultant small SAH, infarct embolic likely secondary to PAF even on Eliquis however noted patient having multiple stroke risk factors. Vascular risk factors include HTN, HLD, DM, A. fib, current tobacco use, hx of PFO s/p closure 2009, history of multiple strokes (see HPI), and advanced age.      PLAN:  1. R MCA stroke, embolic:  2. History of multiple strokes: a. Residual deficit: b/l hand weakness with hypersensory complaints and numbness and cognitive impairment with short-term memory difficulty.  Referral placed to outpatient OT and SLP (cognitive therapy).  Long discussion regarding residual deficits and discussed typical timeframe of recovery and unable to state amount of recovery especially with multiple prior strokes.  Difficulty tolerating very low-dose gabapentin and would be hesitant to trial any other type medications for paresthesias at this time due to risk greater than benefit.  b. Suspicion for underlying  anxiety greatly contributing to symptoms and advised to follow-up with PCP for further management options and potentially establish care with psychiatry but this was deferred to PCP c. Continue Eliquis (apixaban) daily  and Crestor for secondary stroke prevention.   d. Discussed secondary stroke prevention measures and importance of close PCP follow up for aggressive stroke risk factor management  3. Atrial fibrillation: Continue Eliquis 5 mg twice daily per cardiology.  Not eligible for watchman device due to history of PFO s/p closure per Dr. Quentin Ore (evaluated 12/21) 4. HTN: BP goal <130/90.  Stable at home managed by PCP/cardiology 5. HLD: LDL goal <70. Recent LDL 44 on atorvastatin 80 mg daily transition to Crestor 40 mg daily per Dr. [name].  6. DMII: A1c goal<7.0. Recent A1c 6.2.  On metformin per PCP    Follow up in 3 months or call earlier if needed   CC:  [facility] provider: Dr. Moss Mc, Wendelyn Breslow III, MD    I spent a prolonged 50 minutes of face-to-face and non-face-to-face time with patient and husband.  This included previsit chart review, lab review, study review, order entry, electronic health record documentation, patient and husband education and discussion regarding prior stroke with residual deficits and further interventions, importance of managing stroke risk factors and answered all other questions to patient and husband's satisfaction   Frann Rider, AGNP-BC  [facility] [address] [place], [place] [address]  Phone [phone] Fax [phone] Note: This document was prepared with digital dictation and possible smart phrase technology. Any transcriptional errors that result from this process are unintentional.

## 2020-12-22 NOTE — Progress Notes (Signed)
I agree with the above plan 

## 2021-01-11 ENCOUNTER — Other Ambulatory Visit: Payer: Self-pay

## 2021-01-11 NOTE — Patient Outreach (Signed)
[place] [facility]) Care Management  01/11/2021  Tereasa Bursey 22-Dec-1945 [id number]   Telephone outreach to patient to obtain mRS was successfully completed. MRS= 3  Thank you, [place] Care Management Assistant

## 2021-01-11 NOTE — Patient Outreach (Signed)
[place] [facility]) Care Management  01/11/2021  Erin Yates [birth year] [id number]   First telephone outreach attempt to obtain mRS. No answer. Left message for returned call.  Philmore Pali [facility] Management Assistant [phone]

## 2021-01-25 DIAGNOSIS — I10 Essential (primary) hypertension: Secondary | ICD-10-CM | POA: Diagnosis not present

## 2021-01-25 DIAGNOSIS — E034 Atrophy of thyroid (acquired): Secondary | ICD-10-CM | POA: Diagnosis not present

## 2021-01-25 DIAGNOSIS — E7849 Other hyperlipidemia: Secondary | ICD-10-CM | POA: Diagnosis not present

## 2021-01-25 DIAGNOSIS — E1129 Type 2 diabetes mellitus with other diabetic kidney complication: Secondary | ICD-10-CM | POA: Diagnosis not present

## 2021-01-25 DIAGNOSIS — R809 Proteinuria, unspecified: Secondary | ICD-10-CM | POA: Diagnosis not present

## 2021-01-25 DIAGNOSIS — E538 Deficiency of other specified B group vitamins: Secondary | ICD-10-CM | POA: Diagnosis not present

## 2021-01-27 ENCOUNTER — Ambulatory Visit: Payer: Medicare HMO | Admitting: Internal Medicine

## 2021-01-27 ENCOUNTER — Encounter: Payer: Self-pay | Admitting: Internal Medicine

## 2021-01-27 ENCOUNTER — Other Ambulatory Visit: Payer: Self-pay

## 2021-01-27 VITALS — BP 132/68 | HR 62 | Ht 65.0 in | Wt 116.0 lb

## 2021-01-27 DIAGNOSIS — I639 Cerebral infarction, unspecified: Secondary | ICD-10-CM | POA: Diagnosis not present

## 2021-01-27 DIAGNOSIS — I480 Paroxysmal atrial fibrillation: Secondary | ICD-10-CM

## 2021-01-27 NOTE — Patient Instructions (Signed)
Medication Instructions:  - Your physician has recommended you make the following change in your medication:   1) STOP amiodarone  *If you need a refill on your cardiac medications before your next appointment, please call your pharmacy*   Lab Work: - none ordered  If you have labs (blood work) drawn today and your tests are completely normal, you will receive your results only by: Marland Kitchen MyChart Message (if you have MyChart) OR . A paper copy in the mail If you have any lab test that is abnormal or we need to change your treatment, we will call you to review the results.   Testing/Procedures: - none ordered   Follow-Up: At [facility], you and your health needs are our priority.  As part of our continuing mission to provide you with exceptional heart care, we have created designated Provider Care Teams.  These Care Teams include your primary Cardiologist (physician) and Advanced Practice Providers (APPs -  Physician Assistants and Nurse Practitioners) who all work together to provide you with the care you need, when you need it.  We recommend signing up for the patient portal called "MyChart".  Sign up information is provided on this After Visit Summary.  MyChart is used to connect with patients for Virtual Visits (Telemedicine).  Patients are able to view lab/test results, encounter notes, upcoming appointments, etc.  Non-urgent messages can be sent to your provider as well.   To learn more about what you can do with MyChart, go to [email or web address].    Your next appointment:   6 month(s)  The format for your next appointment:   In Person  Provider:   Virl Axe, MD   Other Instructions n/a

## 2021-01-27 NOTE — Progress Notes (Signed)
Patient Care Team: Adin Hector, MD as PCP - General (Internal Medicine) Deboraha Sprang, MD as PCP - Cardiology (Cardiology) Deboraha Sprang, MD as PCP - Electrophysiology (Cardiology)   HPI  Erin Yates is a 75 y.o. female Seen in the past by Dr. Carlyn Reichert because of palpitations in the context of prior closure of the PFO and cryptogenic stroke.  TEE 4/17 was normal with Amplatzer closure device in good position    Device >>SCAF >> .apixoban issue of compliance related to cost--but continuing   She has had now 7 strokes. She has lost vision with one of the more recent strokes; recurrent stroke 11/21.  Referred for consideration of watchman.  However, Amplatzer closure device is precluded.  Continued anticoagulation was recommended by Dr. Daune Perch   she had bradycardia with gradual discontinuation of her beta-blockers  She was started on amiodarone and unfortunately has been on high doses since then.  Has developed nausea.  There is worsening tingling in her hands and is struggling with her day-to-day activities.  Aggravated. Dyspnea chest pain and edema are not limiting.      Date Cr K Hgb  5/19.  1.1 5.2   1/20 1/4 4.8   3/21 1.59 4.6 11.3        DATE TEST EF   4/17 Echo   55-60 %   1/21 Echo  55-60 % "ill defined density post leaflet"  4/21 TEE 60-65 Prolapsed P3 leaflet/ MR mod   11/21 Echo  60-65% MR mild        Past Medical History:  Diagnosis Date  . Anemia    past  hx  . Anxiety   . Depression   . Diabetes mellitus without complication ([place])   . Dysrhythmia    prior to PFO correction  . GERD (gastroesophageal reflux disease)   . Headache    before PFO closure  . Hypercholesteremia   . Hypertension   . Hypothyroidism   . 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 PFO (patent foramen ovale)    correction device placed 2010  . Right leg numbness    lower leg- residual from CVA  . Seasonal allergies   . Stroke [facility])    prior to PFO correction     Past Surgical History:  Procedure Laterality Date  . ABDOMINAL HYSTERECTOMY    . BUBBLE STUDY  02/06/2020   Procedure: BUBBLE STUDY;  Surgeon: Sanda Tyliah Schlereth, MD;  Location: [facility];  Service: Cardiovascular;;  . [place]  . CATARACT EXTRACTION W/PHACO Right 09/01/2015   Procedure: CATARACT EXTRACTION PHACO AND INTRAOCULAR LENS PLACEMENT (IOC);  Surgeon: Leandrew Koyanagi, MD;  Location: [facility];  Service: Ophthalmology;  Laterality: Right;  DIABETIC - oral meds  . CATARACT EXTRACTION W/PHACO Left 10/20/2015   Procedure: CATARACT EXTRACTION PHACO AND INTRAOCULAR LENS PLACEMENT (IOC);  Surgeon: Leandrew Koyanagi, MD;  Location: [facility];  Service: Ophthalmology;  Laterality: Left;  DIABETIC - oral meds  . EP IMPLANTABLE DEVICE N/A 04/04/2016   Procedure: Loop Recorder Insertion;  Surgeon: Deboraha Sprang, MD;  Location: [facility] CV LAB;  Service: Cardiovascular;  Laterality: N/A;  . FOOT NEUROMA SURGERY Left   . IR CT HEAD LTD  10/02/2020  . IR PERCUTANEOUS ART THROMBECTOMY/INFUSION INTRACRANIAL INC DIAG ANGIO  10/02/2020  . IR US GUIDE VASC ACCESS RIGHT  10/02/2020  . NASAL SINUS SURGERY    .  PATENT FORAMEN OVALE CLOSURE    . PATENT FORAMEN OVALE CLOSURE    . RADIOLOGY WITH ANESTHESIA N/A 10/02/2020   Procedure: IR WITH ANESTHESIA;  Surgeon: Luanne Bras, MD;  Location: [facility];  Service: Radiology;  Laterality: N/A;  . TEE WITHOUT CARDIOVERSION N/A 02/09/2016   Procedure: TRANSESOPHAGEAL ECHOCARDIOGRAM (TEE);  Surgeon: Adrian Prows, MD;  Location: [facility];  Service: Cardiovascular;  Laterality: N/A;  . TEE WITHOUT CARDIOVERSION N/A 11/12/2019   Procedure: TRANSESOPHAGEAL ECHOCARDIOGRAM (TEE);  Surgeon: Minna Merritts, MD;  Location: [facility];  Service: Cardiovascular;  Laterality: N/A;  . TEE WITHOUT CARDIOVERSION N/A 02/06/2020   Procedure: TRANSESOPHAGEAL ECHOCARDIOGRAM (TEE);  Surgeon: Sanda , MD;  Location: [facility]  ENDOSCOPY;  Service: Cardiovascular;  Laterality: N/A;    Current Outpatient Medications  Medication Sig Dispense Refill  . ALPRAZolam (XANAX) 0.25 MG tablet Take 0.25 mg by mouth 2 (two) times daily as needed for anxiety.    Marland Kitchen amiodarone (PACERONE) 200 MG tablet Take 1 tablet (200 mg total) by mouth 2 (two) times daily. 60 tablet 2  . amLODipine (NORVASC) 10 MG tablet Take 10 mg by mouth every evening.     Marland Kitchen buPROPion (WELLBUTRIN SR) 150 MG 12 hr tablet Take 1 tablet (150 mg total) by mouth 2 (two) times daily. 60 tablet 5  . cholecalciferol (VITAMIN D3) 25 MCG (1000 UT) tablet Take 1,000 Units by mouth daily.    Marland Kitchen desvenlafaxine (PRISTIQ) 100 MG 24 hr tablet Take 100 mg by mouth daily.    Marland Kitchen ELIQUIS 5 MG TABS tablet TAKE 1 TABLET (5 MG TOTAL) BY MOUTH 2 (TWO) TIMES DAILY. 180 tablet 1  . ferrous sulfate 325 (65 FE) MG tablet Take 325 mg by mouth every evening.    . fluticasone (FLONASE) 50 MCG/ACT nasal spray Place into both nostrils daily as needed for allergies or rhinitis.    Marland Kitchen gabapentin (NEURONTIN) 100 MG capsule Take by mouth.    . levothyroxine (SYNTHROID, LEVOTHROID) 50 MCG tablet Take 50 mcg by mouth daily before breakfast.    . metFORMIN (GLUCOPHAGE) 1000 MG tablet Take 1,000 mg by mouth daily with breakfast.     . pantoprazole (PROTONIX) 40 MG tablet Take 40 mg by mouth 2 (two) times daily. Reported on 04/17/2016    . rosuvastatin (CRESTOR) 40 MG tablet Take 1 tablet (40 mg total) by mouth daily. 30 tablet 2  . traZODone (DESYREL) 50 MG tablet Take 50 mg by mouth at bedtime as needed for sleep.     . vitamin B-12 (CYANOCOBALAMIN) 500 MCG tablet Take 500 mcg by mouth every evening.      No current facility-administered medications for this visit.    Allergies  Allergen Reactions  . Alendronate Other (See Comments)    Rash   . Penicillins Rash    Also swelling and itching  Did it involve swelling of the face/tongue/throat, SOB, or low BP? Yes Did it involve sudden or severe  rash/hives, skin peeling, or any reaction on the inside of your mouth or nose? Yes Did you need to seek medical attention at a hospital or doctor's office? Yes When did it last happen?More than 15 years ago If all above answers are "NO", may proceed with cephalosporin use.       Review of Systems negative except from HPI and PMH  Physical Exam BP 132/68 (BP Location: Left Arm, Patient Position: Sitting, Cuff Size: Normal)   Pulse 62   Ht '5\' 5"'$  (1.651 m)   Wt 116  lb (52.6 kg)   SpO2 97%   BMI 19.30 kg/m  Well developed and nourished in no acute distress HENT normal Neck supple with JVP-  flat   Clear Regular rate and rhythm, no murmurs or gallops Abd-soft with active BS No Clubbing cyanosis edema Skin-warm and dry A & Oriented  Grossly normal sensory and motor function  ECG sinus at 62 Interval 22/08/43  Assessment and  Plan\ Cryptogenic stroke  Hypertension  Subclinical atrial fibrillation  Sinus bradycardia-symptomatic  PFO-Amplatzer closure  Mitral valve lesion-calcified  Nausea  Tobacco abuse  High Risk Medication Surveillance amiodarone  Aggravation with stroke residua  Recurrent strokes.  On Eliquis.  Decision was made, given Amplatzer in place that she would not be a candidate for watchman  Was started on amiodarone; nausea and worsening finger control.  We will stop it.  Reached out to neurology regarding physical therapy; they have arranged for occupational therapy.  Have reached out to primary care to consider palliative care support

## 2021-01-28 ENCOUNTER — Telehealth: Payer: Self-pay | Admitting: Internal Medicine

## 2021-01-28 NOTE — Telephone Encounter (Signed)
Verbal message received from Dr. [name] that he had heard back from the patient's neurologist about her referrals. Per neurology, the patient's insurance had just approved these and she may now call to schedule.  I have reviewed the patient's chart. The active referrals are for OT/ ST. I have looked up the rehab phone # for these services at [facility]- [phone].   I have attempted to call the patient/ her husband to notify them of the information as stated above. No answer- I left a message for the patient/ her husband to please call back.

## 2021-01-28 NOTE — Telephone Encounter (Signed)
Patient spouse on dpr returning call.  He is aware of msg below and was provided the number to call rehab to set up referral visits.

## 2021-02-01 ENCOUNTER — Telehealth: Payer: Self-pay | Admitting: *Deleted

## 2021-02-01 DIAGNOSIS — R809 Proteinuria, unspecified: Secondary | ICD-10-CM | POA: Diagnosis not present

## 2021-02-01 DIAGNOSIS — I480 Paroxysmal atrial fibrillation: Secondary | ICD-10-CM | POA: Diagnosis not present

## 2021-02-01 DIAGNOSIS — E038 Other specified hypothyroidism: Secondary | ICD-10-CM | POA: Diagnosis not present

## 2021-02-01 DIAGNOSIS — F3342 Major depressive disorder, recurrent, in full remission: Secondary | ICD-10-CM | POA: Diagnosis not present

## 2021-02-01 DIAGNOSIS — I69354 Hemiplegia and hemiparesis following cerebral infarction affecting left non-dominant side: Secondary | ICD-10-CM | POA: Diagnosis not present

## 2021-02-01 DIAGNOSIS — E034 Atrophy of thyroid (acquired): Secondary | ICD-10-CM | POA: Diagnosis not present

## 2021-02-01 DIAGNOSIS — E1151 Type 2 diabetes mellitus with diabetic peripheral angiopathy without gangrene: Secondary | ICD-10-CM | POA: Diagnosis not present

## 2021-02-01 DIAGNOSIS — E1129 Type 2 diabetes mellitus with other diabetic kidney complication: Secondary | ICD-10-CM | POA: Diagnosis not present

## 2021-02-01 DIAGNOSIS — D692 Other nonthrombocytopenic purpura: Secondary | ICD-10-CM | POA: Diagnosis not present

## 2021-02-01 NOTE — Telephone Encounter (Signed)
Attempted to contact patient to schedule lung screening. Left message to call Shawn at [phone]. 

## 2021-02-02 ENCOUNTER — Telehealth: Payer: Self-pay | Admitting: Adult Health Nurse Practitioner

## 2021-02-02 NOTE — Telephone Encounter (Signed)
Spoke with patient's daughter, Deboraha Sprang, regarding the Palliative referral/services and all questions were answered and she was in agreement with starting services.  I have scheduled an In-home Consult for 02/03/21 @ 1:30 PM

## 2021-02-03 ENCOUNTER — Telehealth: Payer: Self-pay | Admitting: Adult Health

## 2021-02-03 ENCOUNTER — Other Ambulatory Visit: Payer: Self-pay

## 2021-02-03 ENCOUNTER — Other Ambulatory Visit: Payer: Self-pay | Admitting: Adult Health Nurse Practitioner

## 2021-02-03 DIAGNOSIS — Z515 Encounter for palliative care: Secondary | ICD-10-CM

## 2021-02-03 DIAGNOSIS — Z8673 Personal history of transient ischemic attack (TIA), and cerebral infarction without residual deficits: Secondary | ICD-10-CM

## 2021-02-03 DIAGNOSIS — R110 Nausea: Secondary | ICD-10-CM | POA: Diagnosis not present

## 2021-02-03 DIAGNOSIS — I69391 Dysphagia following cerebral infarction: Secondary | ICD-10-CM | POA: Diagnosis not present

## 2021-02-03 DIAGNOSIS — R519 Headache, unspecified: Secondary | ICD-10-CM | POA: Diagnosis not present

## 2021-02-03 NOTE — Progress Notes (Signed)
[facility] Palliative Care Consult Note Telephone: [phone]  Fax: [phone]  PATIENT NAME: Erin Yates DOB: [birth year] MRN: [id number]  PRIMARY CARE PROVIDER:   Adin Hector, MD  REFERRING PROVIDER:  Adin Hector, MD [place],  [place] [address]  RESPONSIBLE PARTY:   Lannette Sarasin, husband [phone] Roselyn Reef already, daughter [phone]  Chief complaint: Initial palliative visit/nausea  Husband and daughter present during visit today    RECOMMENDATIONS and PLAN:  1.  Advanced care planning.  Patient full code.  Husband and patient did voice today that they would consider CPR and other life prolonging measures if they were able to improve and have a good quality of life.  Left copy of HC POA and living well for family to go over.  Spent more than 20 minutes discussing ACP.  2.  History of CVA.  Patient's functional status is slowly returning.  Sh.  E is waiting to work with physical therapy until she is not so weak from poor nutritional intake, see below.  Work with PT/OT when able.  3.  Nausea.  Patient having nausea since last stroke.  She has also lost sense of taste.  Have encouraged finding things that taste good to her that she will eat.  Have tried supplementation with protein drinks and this has caused nausea at times.  Will reach out to PCP for order for Zofran.  Did discuss with patient and family that if after controlling the nausea she still is not eating and losing weight that may need to add something like Remeron to increase her appetite.  4.  Headaches.  See below for description of headaches.  Discussed with patient and family possibility of cluster headaches or temporal arteritis.  Will reach out to PCP office for further evaluation of headaches as patient does have an appointment there in 3 weeks.  Palliative will continue to monitor for symptom management/decline and make  recommendations as needed.  Follow-up appointment in 4 weeks.  Family encouraged to call with any questions or concerns.   HISTORY OF PRESENT ILLNESS:  Erin Yates is a 75 y.o. year old female with multiple medical problems including history of multiple CVAs and TIAs, A. fib, HTN, anemia, vitamin B12 deficiency, history of GI bleed, CKD stage III, DMT2, HLD, PVD, valvular heart disease, hypothyroidism, depression, GERD. Palliative Care was asked to help address goals of care.  Patient lives at home with husband.  Reviewed patient's EMR including most recent labs and imaging.   Patient has had multiple TIAs and CVAs.  She did have CVA in January, April, and December 2021.  Patient's functional status has been slowly coming back after this past CVA in December.  She does hold onto furniture and walls when she walks throughout the house.  Patient is fairly independent of ADLs though husband does offer standby assist.  She is able to do some light housecleaning and does pedal around the house including dusting.  She has not had any falls.  The family does states she can be wobbly on her feet.  Patient does state that since her last stroke she has been having some numbness in her fingers which makes it hard for her to pick up things. Since her last stroke patient has had nausea which keeps her from eating very much.  She also states that things do not taste the same and she has not found anything that  tastes good.  She has been encouraged to eat use protein drinks supplements.  She has tried Pepto-Bismol for the nausea and this does not offer much relief.  She has been trying to eat and can hold down little bits of food but she is not getting enough calories in and is feeling very weak and shaky.  Patient has history of diabetes type 2 and her Metformin has been stopped due to her low oral intake.  Patient has lost weight.  In November she was 131 pounds with BMI 21.80.  She currently weighs 115.2 pounds with BMI  of 19.77. Patient complains of headaches.  States that the pain is around her right temple and eye.  Daughter does state that she complains almost every day with these headaches.  Daughter states that she was having these headaches prior to the last stroke on a frequency of 2-3 a week and they are occurring more often since having this last stroke in December.  Patient does state that she does get tearing and rhinorrhea with the headaches.  Rest of 10 point ROS asked and negative.  CODE STATUS: see above  PPS: 60% HOSPICE ELIGIBILITY/DIAGNOSIS: TBD  PHYSICAL EXAM:   General: NAD, frail appearing, thin Eyes: Sclera anicteric and noninjected with no discharge noted ENMT: Moist mucous membranes Pulmonary: Normal respiratory effort Extremities: no edema, no joint deformities Skin: no rashes on exposed skin MSK: Mild sarcopenia Neurological: Weakness; patient has forgetfulness and problems with word finding   Family History  Problem Relation Age of Onset  . Cancer Sister  . Breast cancer Sister     PAST MEDICAL HISTORY:  Past Medical History:  Diagnosis Date  . Anemia    past  hx  . Anxiety   . Depression   . Diabetes mellitus without complication ([place])   . Dysrhythmia    prior to PFO correction  . GERD (gastroesophageal reflux disease)   . Headache    before PFO closure  . Hypercholesteremia   . Hypertension   . Hypothyroidism   . 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 PFO (patent foramen ovale)    correction device placed 2010  . Right leg numbness    lower leg- residual from CVA  . Seasonal allergies   . Stroke [facility])    prior to PFO correction    SOCIAL HX:  Social History   Tobacco Use  . Smoking status: Current Every Day Smoker    Packs/day: 1.00    Years: 55.00    Pack years: 55.00  . Smokeless tobacco: Never Used  Substance Use Topics  . Alcohol use: No    ALLERGIES:  Allergies  Allergen Reactions  . Alendronate Other (See Comments)     Rash   . Penicillins Rash    Also swelling and itching  Did it involve swelling of the face/tongue/throat, SOB, or low BP? Yes Did it involve sudden or severe rash/hives, skin peeling, or any reaction on the inside of your mouth or nose? Yes Did you need to seek medical attention at a hospital or doctor's office? Yes When did it last happen?More than 15 years ago If all above answers are "NO", may proceed with cephalosporin use.      PERTINENT MEDICATIONS:  Outpatient Encounter Medications as of 02/03/2021  Medication Sig  . ALPRAZolam (XANAX) 0.25 MG tablet Take 0.25 mg by mouth 2 (two) times daily as needed for anxiety.  Marland Kitchen amLODipine (NORVASC) 10 MG tablet Take 10 mg  by mouth every evening.   Marland Kitchen buPROPion (WELLBUTRIN SR) 150 MG 12 hr tablet Take 1 tablet (150 mg total) by mouth 2 (two) times daily.  . cholecalciferol (VITAMIN D3) 25 MCG (1000 UT) tablet Take 1,000 Units by mouth daily.  Marland Kitchen desvenlafaxine (PRISTIQ) 100 MG 24 hr tablet Take 100 mg by mouth daily.  Marland Kitchen ELIQUIS 5 MG TABS tablet TAKE 1 TABLET (5 MG TOTAL) BY MOUTH 2 (TWO) TIMES DAILY.  . ferrous sulfate 325 (65 FE) MG tablet Take 325 mg by mouth every evening.  . fluticasone (FLONASE) 50 MCG/ACT nasal spray Place into both nostrils daily as needed for allergies or rhinitis.  Marland Kitchen gabapentin (NEURONTIN) 100 MG capsule Take by mouth.  . levothyroxine (SYNTHROID, LEVOTHROID) 50 MCG tablet Take 50 mcg by mouth daily before breakfast.  . metFORMIN (GLUCOPHAGE) 1000 MG tablet Take 1,000 mg by mouth daily with breakfast.   . pantoprazole (PROTONIX) 40 MG tablet Take 40 mg by mouth 2 (two) times daily. Reported on 04/17/2016  . rosuvastatin (CRESTOR) 40 MG tablet Take 1 tablet (40 mg total) by mouth daily.  . traZODone (DESYREL) 50 MG tablet Take 50 mg by mouth at bedtime as needed for sleep.   . vitamin B-12 (CYANOCOBALAMIN) 500 MCG tablet Take 500 mcg by mouth every evening.    No facility-administered encounter medications on file as  of 02/03/2021.      Ram Haugan Jenetta Downer, NP

## 2021-02-03 NOTE — Telephone Encounter (Signed)
LVM for pt to call back to r/s due to Janett Billow being out

## 2021-02-07 ENCOUNTER — Telehealth: Payer: Self-pay | Admitting: Adult Health Nurse Practitioner

## 2021-02-07 NOTE — Telephone Encounter (Signed)
Called PCP office with recommendation for zofran for nausea.   Received call back later that PCP had sent in order for the zofran. Arnoldo Hildreth K. Olena Heckle NP

## 2021-02-10 ENCOUNTER — Encounter: Payer: Self-pay | Admitting: *Deleted

## 2021-02-11 ENCOUNTER — Encounter: Payer: Self-pay | Admitting: *Deleted

## 2021-02-16 DIAGNOSIS — I10 Essential (primary) hypertension: Secondary | ICD-10-CM | POA: Diagnosis not present

## 2021-02-17 ENCOUNTER — Ambulatory Visit: Payer: Medicare HMO | Admitting: Adult Health

## 2021-02-23 ENCOUNTER — Telehealth: Payer: Self-pay | Admitting: Adult Health Nurse Practitioner

## 2021-02-23 DIAGNOSIS — I480 Paroxysmal atrial fibrillation: Secondary | ICD-10-CM | POA: Diagnosis not present

## 2021-02-23 DIAGNOSIS — I10 Essential (primary) hypertension: Secondary | ICD-10-CM | POA: Diagnosis not present

## 2021-02-23 DIAGNOSIS — I129 Hypertensive chronic kidney disease with stage 1 through stage 4 chronic kidney disease, or unspecified chronic kidney disease: Secondary | ICD-10-CM | POA: Diagnosis not present

## 2021-02-23 DIAGNOSIS — I69354 Hemiplegia and hemiparesis following cerebral infarction affecting left non-dominant side: Secondary | ICD-10-CM | POA: Diagnosis not present

## 2021-02-23 DIAGNOSIS — E1151 Type 2 diabetes mellitus with diabetic peripheral angiopathy without gangrene: Secondary | ICD-10-CM | POA: Diagnosis not present

## 2021-02-23 DIAGNOSIS — D692 Other nonthrombocytopenic purpura: Secondary | ICD-10-CM | POA: Diagnosis not present

## 2021-02-23 DIAGNOSIS — E034 Atrophy of thyroid (acquired): Secondary | ICD-10-CM | POA: Diagnosis not present

## 2021-02-23 DIAGNOSIS — N184 Chronic kidney disease, stage 4 (severe): Secondary | ICD-10-CM | POA: Diagnosis not present

## 2021-02-23 DIAGNOSIS — F3342 Major depressive disorder, recurrent, in full remission: Secondary | ICD-10-CM | POA: Diagnosis not present

## 2021-02-23 DIAGNOSIS — E1122 Type 2 diabetes mellitus with diabetic chronic kidney disease: Secondary | ICD-10-CM | POA: Diagnosis not present

## 2021-02-23 NOTE — Telephone Encounter (Signed)
Called and left message with patient's husband, [name], to see if we could change the time of the Palliative f/u visit scheduled on 03/03/21 from 1 PM to 12:30 PM.  Left my name and call back number.

## 2021-03-03 ENCOUNTER — Other Ambulatory Visit: Payer: Self-pay | Admitting: Adult Health Nurse Practitioner

## 2021-03-03 ENCOUNTER — Encounter: Payer: Self-pay | Admitting: Adult Health Nurse Practitioner

## 2021-03-03 ENCOUNTER — Other Ambulatory Visit: Payer: Self-pay

## 2021-03-03 VITALS — BP 130/78 | HR 92

## 2021-03-03 DIAGNOSIS — Z8673 Personal history of transient ischemic attack (TIA), and cerebral infarction without residual deficits: Secondary | ICD-10-CM | POA: Diagnosis not present

## 2021-03-03 DIAGNOSIS — F419 Anxiety disorder, unspecified: Secondary | ICD-10-CM

## 2021-03-03 DIAGNOSIS — Z515 Encounter for palliative care: Secondary | ICD-10-CM | POA: Diagnosis not present

## 2021-03-03 DIAGNOSIS — R110 Nausea: Secondary | ICD-10-CM

## 2021-03-03 NOTE — Progress Notes (Signed)
Therapist, nutritional Palliative Care Consult Note Telephone: [phone]  Fax: [phone]    Date of encounter: 03/03/21 PATIENT NAME: Erin Yates [address] [place] [address]   [phone] (home)  DOB: 09/30/1946 MRN: [id number] PRIMARY CARE PROVIDER:    Lynnea Ferrier, MD,  [address] [facility] [place] [address] [phone]  REFERRING PROVIDER:   Lynnea Ferrier, MD [address] [facility] [place],  [place] [address] [phone]  RESPONSIBLE PARTY:    Contact Information    Name Relation Home Work Harrison Spouse [phone]     Phillis Knack Daughter   [phone]       I met face to face with patient and family in home. Palliative Care was asked to follow this patient by consultation request of  Lynnea Ferrier, MD to address advance care planning and complex medical decision making. This is a follow up visit.  Husband present during visit today                                   ASSESSMENT AND PLAN / RECOMMENDATIONS:   Advance Care Planning/Goals of Care: Goals include to maximize quality of life and symptom management.  Have not talked about or filled out advance directives.  Encouraged to discuss as a family and to fill out.  Previous discussion patient wishes to be full code  CODE STATUS: full code  Symptom Management/Plan:  History of embolic stroke: Functionally patient is doing well she is ambulatory without assistive devices and is independent of ADLs.  She does endorse she does need help dressing.  Nausea: Gets relief with as needed Zofran.  Appetite seems to be somewhat improved.  Has gained 1 pound and will continue to monitor for improved appetite and weight gain.  Anxiety: Patient gets relief with as needed Xanax and she is encouraged to continue taking this for continued relief.   Follow up Palliative Care Visit: Palliative care will continue to  follow for complex medical decision making, advance care planning, and clarification of goals. Return 8 weeks or prn.  Encouraged to call with any questions or concerns  I spent 40 minutes providing this consultation. More than 50% of the time in this consultation was spent in counseling and care coordination.   PPS: 60%  HOSPICE ELIGIBILITY/DIAGNOSIS: TBD  Chief Complaint: follow up palliative visit  HISTORY OF PRESENT ILLNESS:  Erin Yates is a 75 y.o. year old female  with history of multiple CVAs and TIAs, A. fib, HTN, anemia, vitamin B12 deficiency, history of GI bleed, CKD stage III, DMT2, HLD, PVD, valvular heart disease, hypothyroidism, depression, GERD.  Last week patient had a rough week with shaking and uncontrollable crying.  Patient states that she just did not feel right.  She was seen by PCP and has Xanax 0.25 mg twice daily as needed.  States that she has had this medication in the past but has not been taking it regularly.  States that she is feeling much better now and husband feels the same way that she is much better and has been taking the Xanax maybe once a day.  States that her appetite is a little bit better and is trying to eat more.  She does state that things just do not taste good.  States that the only  thing that tastes good is cinnamon Mentos. Does get relief from her nausea with as needed Zofran.  At last doctor's appointment she has gained a pound.  Rest of 10 point ROS asked and negative except what is stated in HPI  History obtained from review of EMR and interview with family and Ms. Breeding.     PHYSICAL EXAM:   General: NAD, frail appearing, thin Eyes: Sclera anicteric and noninjected with no discharge noted ENMT: Moist mucous membranes Cardiovascular:  RRR Pulmonary:lung sounds diminished with no adventitious breath sounds heard;  Normal respiratory effort Extremities: no edema, no joint deformities Skin: no rashes on exposed skin MSK: able to move  all extremities; ambulatory without assistive devices Neurological: Weakness; patient has forgetfulness and problems with word finding  Thank you for the opportunity to participate in the care of Ms. Tatro.  The palliative care team will continue to follow. Please call our office at [phone] if we can be of additional assistance.   Hadiyah Maricle Jenetta Downer, NP , DNP  This chart was dictated using voice recognition software. Despite best efforts to proofread, errors can occur which can change the documentation meaning.   COVID-19 PATIENT SCREENING TOOL Asked and negative response unless otherwise noted:   Have you had symptoms of covid, tested positive or been in contact with someone with symptoms/positive test in the past 5-10 days? negative

## 2021-03-24 ENCOUNTER — Ambulatory Visit: Payer: Medicare HMO | Admitting: Adult Health

## 2021-03-29 DIAGNOSIS — E1122 Type 2 diabetes mellitus with diabetic chronic kidney disease: Secondary | ICD-10-CM | POA: Diagnosis not present

## 2021-03-29 DIAGNOSIS — I129 Hypertensive chronic kidney disease with stage 1 through stage 4 chronic kidney disease, or unspecified chronic kidney disease: Secondary | ICD-10-CM | POA: Diagnosis not present

## 2021-03-29 DIAGNOSIS — E1151 Type 2 diabetes mellitus with diabetic peripheral angiopathy without gangrene: Secondary | ICD-10-CM | POA: Diagnosis not present

## 2021-03-29 DIAGNOSIS — F3342 Major depressive disorder, recurrent, in full remission: Secondary | ICD-10-CM | POA: Diagnosis not present

## 2021-03-29 DIAGNOSIS — D692 Other nonthrombocytopenic purpura: Secondary | ICD-10-CM | POA: Diagnosis not present

## 2021-03-29 DIAGNOSIS — N184 Chronic kidney disease, stage 4 (severe): Secondary | ICD-10-CM | POA: Diagnosis not present

## 2021-03-29 DIAGNOSIS — I480 Paroxysmal atrial fibrillation: Secondary | ICD-10-CM | POA: Diagnosis not present

## 2021-03-29 DIAGNOSIS — I69354 Hemiplegia and hemiparesis following cerebral infarction affecting left non-dominant side: Secondary | ICD-10-CM | POA: Diagnosis not present

## 2021-03-29 DIAGNOSIS — K219 Gastro-esophageal reflux disease without esophagitis: Secondary | ICD-10-CM | POA: Diagnosis not present

## 2021-04-21 DIAGNOSIS — D519 Vitamin B12 deficiency anemia, unspecified: Secondary | ICD-10-CM | POA: Diagnosis not present

## 2021-04-21 DIAGNOSIS — E034 Atrophy of thyroid (acquired): Secondary | ICD-10-CM | POA: Diagnosis not present

## 2021-04-21 DIAGNOSIS — I700 Atherosclerosis of aorta: Secondary | ICD-10-CM | POA: Diagnosis not present

## 2021-04-21 DIAGNOSIS — E1129 Type 2 diabetes mellitus with other diabetic kidney complication: Secondary | ICD-10-CM | POA: Diagnosis not present

## 2021-04-21 DIAGNOSIS — E7849 Other hyperlipidemia: Secondary | ICD-10-CM | POA: Diagnosis not present

## 2021-04-21 DIAGNOSIS — R809 Proteinuria, unspecified: Secondary | ICD-10-CM | POA: Diagnosis not present

## 2021-04-21 DIAGNOSIS — E538 Deficiency of other specified B group vitamins: Secondary | ICD-10-CM | POA: Diagnosis not present

## 2021-04-28 DIAGNOSIS — D692 Other nonthrombocytopenic purpura: Secondary | ICD-10-CM | POA: Diagnosis not present

## 2021-04-28 DIAGNOSIS — E1122 Type 2 diabetes mellitus with diabetic chronic kidney disease: Secondary | ICD-10-CM | POA: Diagnosis not present

## 2021-04-28 DIAGNOSIS — E1129 Type 2 diabetes mellitus with other diabetic kidney complication: Secondary | ICD-10-CM | POA: Diagnosis not present

## 2021-04-28 DIAGNOSIS — E1151 Type 2 diabetes mellitus with diabetic peripheral angiopathy without gangrene: Secondary | ICD-10-CM | POA: Diagnosis not present

## 2021-04-28 DIAGNOSIS — I69354 Hemiplegia and hemiparesis following cerebral infarction affecting left non-dominant side: Secondary | ICD-10-CM | POA: Diagnosis not present

## 2021-04-28 DIAGNOSIS — I480 Paroxysmal atrial fibrillation: Secondary | ICD-10-CM | POA: Diagnosis not present

## 2021-04-28 DIAGNOSIS — N1832 Chronic kidney disease, stage 3b: Secondary | ICD-10-CM | POA: Diagnosis not present

## 2021-04-28 DIAGNOSIS — F3342 Major depressive disorder, recurrent, in full remission: Secondary | ICD-10-CM | POA: Diagnosis not present

## 2021-04-28 DIAGNOSIS — E034 Atrophy of thyroid (acquired): Secondary | ICD-10-CM | POA: Diagnosis not present

## 2021-05-03 ENCOUNTER — Other Ambulatory Visit: Payer: Self-pay

## 2021-05-03 ENCOUNTER — Other Ambulatory Visit: Payer: Self-pay | Admitting: Adult Health Nurse Practitioner

## 2021-05-03 ENCOUNTER — Encounter: Payer: Self-pay | Admitting: Adult Health Nurse Practitioner

## 2021-05-03 VITALS — BP 112/62 | HR 66 | Wt 109.0 lb

## 2021-05-03 DIAGNOSIS — M79641 Pain in right hand: Secondary | ICD-10-CM

## 2021-05-03 DIAGNOSIS — Z515 Encounter for palliative care: Secondary | ICD-10-CM | POA: Diagnosis not present

## 2021-05-03 DIAGNOSIS — M545 Low back pain, unspecified: Secondary | ICD-10-CM | POA: Diagnosis not present

## 2021-05-03 DIAGNOSIS — E46 Unspecified protein-calorie malnutrition: Secondary | ICD-10-CM

## 2021-05-03 DIAGNOSIS — M79642 Pain in left hand: Secondary | ICD-10-CM | POA: Diagnosis not present

## 2021-05-03 DIAGNOSIS — F419 Anxiety disorder, unspecified: Secondary | ICD-10-CM

## 2021-05-03 NOTE — Progress Notes (Signed)
Therapist, nutritional Palliative Care Consult Note Telephone: [phone]  Fax: [phone]    Date of encounter: 05/03/21 PATIENT NAME: Erin Yates [address] [place] [address]   [phone] (home)  DOB: [birth year] MRN: [id number] PRIMARY CARE PROVIDER:    Lynnea Ferrier, MD,  [address] [facility] [place] [address] [phone]  REFERRING PROVIDER:   Lynnea Ferrier, MD [address] [facility] [place],  [place] [address] [phone]  RESPONSIBLE PARTY:    Contact Information     Name Relation Home Work Perry Spouse [phone]     Phillis Knack Daughter   [phone]        I met face to face with patient and family in home. Palliative Care was asked to follow this patient by consultation request of  Lynnea Ferrier, MD to address advance care planning and complex medical decision making. This is a follow up visit.  Husband present during visit today                                   ASSESSMENT AND PLAN / RECOMMENDATIONS:   Advance Care Planning/Goals of Care: Goals include to maximize quality of life and symptom management. CODE STATUS: full code  Symptom Management/Plan:  Anxiety: States getting good relief with Xanax 0.25 mg twice daily as needed.  Bilateral hand pain: Have suggested using muscle creams such as icy hot  Low back pain: Discussed using muscle cream such as icy hot, Biofreeze or using patches like Salonpas or IcyHot.  Also suggested using relaxation techniques such as deep breathing and/or meditation.  Also suggested using stretching exercises if she is able  PCM: Patient is starting to regain weight.  Encouraged to use supplementation along with the milkshakes and to monitor weight.   Follow up Palliative Care Visit: Palliative care will continue to follow for complex medical decision making, advance care planning, and clarification of  goals. Prefers check ins every couple of months and as needed visits.Encouraged to call with any questions or concerns  I spent 45 minutes providing this consultation. More than 50% of the time in this consultation was spent in counseling and care coordination.   PPS: 60%  HOSPICE ELIGIBILITY/DIAGNOSIS: TBD  Chief Complaint: follow up palliative visit  HISTORY OF PRESENT ILLNESS:  Erin Yates is a 75 y.o. year old female  with multiple CVAs and TIAs, A. fib, HTN, anemia, vitamin B12 deficiency, history of GI bleed, CKD stage III, DMT2, HLD, PVD, valvular heart disease, hypothyroidism, depression, GERD .  Patient is complaining about pain in her hands and wrists states that she has had numbness and pain since her last stroke in November 2021.  Was advised to use Voltaren gel.  She has tried this once and it caused a rash and itching on her hands.  Patient is also complaining about lower back pain over the past couple of weeks.  Denies any radiation.  Upon palpation noted tightness in muscles of her lower back most likely related to stress and husband does say that she has been hunching over more often.  Patient has been eating milkshakes and this has been helping her to regain some of her weight she had lost down to 106 pounds and today she is 109 pounds.  Sometimes states that she cannot get a  good breath and unsure if this is related to her anxiety.  Does state that sometimes her heart will race when she is anxious.  Denies falls, headaches, dizziness, chest pain, N/V/D, constipation, dysuria, hematuria, hematochezia, melena.  History obtained from review of EMR and interview with family and Erin Yates.   PHYSICAL EXAM:   General: NAD, frail appearing, thin Eyes: Sclera anicteric and noninjected with no discharge noted ENMT: Moist mucous membranes Cardiovascular:  RRR Pulmonary:lung sounds diminished with no adventitious breath sounds heard;  Normal respiratory effort Extremities: no edema,  no joint deformities Skin: no rashes on exposed skin MSK: able to move all extremities; ambulatory without assistive devices; has tightness to bilateral muscles of lower back with no ecchymosis, redness, or warmth to touch noted Neurological: patient has forgetfulness and problems with word finding   Thank you for the opportunity to participate in the care of Erin Yates.  The palliative care team will continue to follow. Please call our office at [phone] if we can be of additional assistance.   Amyjo Mizrachi Jenetta Downer, NP , DNP  This chart was dictated using voice recognition software.  Despite best efforts to proofread,  errors can occur which can change the documentation meaning.   COVID-19 PATIENT SCREENING TOOL Asked and negative response unless otherwise noted:   Have you had symptoms of covid, tested positive or been in contact with someone with symptoms/positive test in the past 5-10 days?

## 2021-06-10 ENCOUNTER — Telehealth: Payer: Self-pay

## 2021-06-10 NOTE — Telephone Encounter (Signed)
338 pm.  Follow up call made at the reqeust of Hanley Ben, NP.  Patient states she is doing "okay".  She continues to use xanax 0.25 mg as needed and this is managing her anxiety.  Pain is present to her hands and lower back.  Re-enforced use of salonpas and icy hot.  Patient is not familiar with icy hot so I have advised her on where this can be picked up.  She continues to drink milk shakes to maintain her weight.  No changes in her appetite since her last visit with Palliative Care.   No issues with sleeping are reported.  Patient is open to having the Palliative Care NP follow up with her in the next couple of months.  Advised that I would have our scheduler contact her to make a appointment.  No other concerns voiced at this time.

## 2021-06-21 DIAGNOSIS — D519 Vitamin B12 deficiency anemia, unspecified: Secondary | ICD-10-CM | POA: Diagnosis not present

## 2021-06-21 DIAGNOSIS — D6869 Other thrombophilia: Secondary | ICD-10-CM | POA: Insufficient documentation

## 2021-06-21 DIAGNOSIS — E034 Atrophy of thyroid (acquired): Secondary | ICD-10-CM | POA: Diagnosis not present

## 2021-06-21 DIAGNOSIS — F3342 Major depressive disorder, recurrent, in full remission: Secondary | ICD-10-CM | POA: Diagnosis not present

## 2021-06-21 DIAGNOSIS — E785 Hyperlipidemia, unspecified: Secondary | ICD-10-CM | POA: Diagnosis not present

## 2021-06-21 DIAGNOSIS — E1129 Type 2 diabetes mellitus with other diabetic kidney complication: Secondary | ICD-10-CM | POA: Diagnosis not present

## 2021-06-21 DIAGNOSIS — I739 Peripheral vascular disease, unspecified: Secondary | ICD-10-CM | POA: Diagnosis not present

## 2021-06-21 DIAGNOSIS — I69354 Hemiplegia and hemiparesis following cerebral infarction affecting left non-dominant side: Secondary | ICD-10-CM | POA: Diagnosis not present

## 2021-06-21 DIAGNOSIS — I129 Hypertensive chronic kidney disease with stage 1 through stage 4 chronic kidney disease, or unspecified chronic kidney disease: Secondary | ICD-10-CM | POA: Diagnosis not present

## 2021-06-21 DIAGNOSIS — I480 Paroxysmal atrial fibrillation: Secondary | ICD-10-CM | POA: Diagnosis not present

## 2021-07-14 ENCOUNTER — Other Ambulatory Visit: Payer: Self-pay

## 2021-07-14 ENCOUNTER — Other Ambulatory Visit: Payer: Self-pay | Admitting: *Deleted

## 2021-07-14 ENCOUNTER — Ambulatory Visit: Payer: Medicare HMO | Admitting: Internal Medicine

## 2021-07-14 ENCOUNTER — Encounter: Payer: Self-pay | Admitting: Internal Medicine

## 2021-07-14 VITALS — BP 120/80 | HR 75 | Ht 65.0 in | Wt 112.0 lb

## 2021-07-14 DIAGNOSIS — I10 Essential (primary) hypertension: Secondary | ICD-10-CM | POA: Diagnosis not present

## 2021-07-14 DIAGNOSIS — I639 Cerebral infarction, unspecified: Secondary | ICD-10-CM

## 2021-07-14 DIAGNOSIS — R001 Bradycardia, unspecified: Secondary | ICD-10-CM | POA: Diagnosis not present

## 2021-07-14 DIAGNOSIS — I480 Paroxysmal atrial fibrillation: Secondary | ICD-10-CM

## 2021-07-14 MED ORDER — APIXABAN 2.5 MG PO TABS: 2.5000 mg | ORAL_TABLET | Freq: Two times a day (BID) | ORAL | 3 refills | Status: DC

## 2021-07-14 MED ORDER — APIXABAN 2.5 MG PO TABS: 2.5000 mg | ORAL_TABLET | Freq: Two times a day (BID) | ORAL | 1 refills | Status: DC

## 2021-07-14 NOTE — Progress Notes (Signed)
Patient Care Team: Adin Hector, MD as PCP - General (Internal Medicine) Deboraha Sprang, MD as PCP - Cardiology (Cardiology) Deboraha Sprang, MD as PCP - Electrophysiology (Cardiology)   HPI  Erin Yates is a 75 y.o. female Seen in the past by Dr. Carlyn Reichert because of palpitations in the context of prior closure of the PFO and cryptogenic stroke.  TEE 4/17 was normal with Amplatzer closure device in good position    Device >>SCAF >> .apixoban issue of compliance related to cost--but continuing   She has had now 7 strokes. She has lost vision with one of the more recent strokes; recurrent stroke 11/21.  Referred for consideration of watchman.  However, Amplatzer closure device is precluded.  Continued anticoagulation was recommended by Dr. Daune Perch     She was started on amiodarone and unfortunately has been on high doses since then.  Has developed nausea.  There is worsening tingling in her hands and is struggling with her day-to-day activities.  Aggravated. Dyspnea chest pain and edema are not limiting.      Date Cr K Hgb LDL  5/19.  1.1 5.2     1/20 1/4 4.8     3/21 1.59 4.6 11.3   12/21 1.52 4.0 9.2   6/*22 1.6 4.3 12.1     DATE TEST EF   4/17 Echo   55-60 %   1/21 Echo  55-60 % "ill defined density post leaflet"  4/21 TEE 60-65 Prolapsed P3 leaflet/ MR mod   11/21 Echo  60-65% MR mild        Past Medical History:  Diagnosis Date   Anemia    past  hx   Anxiety    Depression    Diabetes mellitus without complication ([place])    Dysrhythmia    prior to PFO correction   GERD (gastroesophageal reflux disease)    Headache    before PFO closure   Hypercholesteremia    Hypertension    Hypothyroidism    Peripheral vision loss    residual from CVA   PFO (patent foramen ovale)    correction device placed 2010   Right leg numbness    lower leg- residual from CVA   Seasonal allergies    Stroke [facility])    prior to PFO correction    Past Surgical History:   Procedure Laterality Date   ABDOMINAL HYSTERECTOMY     BUBBLE STUDY  02/06/2020   Procedure: BUBBLE STUDY;  Surgeon: Sanda Jatziri Goffredo, MD;  Location: [place];  Service: Cardiovascular;;   CARDIAC CATHETERIZATION     [facility]   CATARACT EXTRACTION W/PHACO Right 09/01/2015   Procedure: CATARACT EXTRACTION PHACO AND INTRAOCULAR LENS PLACEMENT ([place]);  Surgeon: Leandrew Koyanagi, MD;  Location: [place];  Service: Ophthalmology;  Laterality: Right;  DIABETIC - oral meds   CATARACT EXTRACTION W/PHACO Left 10/20/2015   Procedure: CATARACT EXTRACTION PHACO AND INTRAOCULAR LENS PLACEMENT ([facility]);  Surgeon: Leandrew Koyanagi, MD;  Location: [place];  Service: Ophthalmology;  Laterality: Left;  DIABETIC - oral meds   EP IMPLANTABLE DEVICE N/A 04/04/2016   Procedure: Loop Recorder Insertion;  Surgeon: Deboraha Sprang, MD;  Location: [place] [facility];  Service: Cardiovascular;  Laterality: N/A;   FOOT NEUROMA SURGERY Left    IR CT HEAD LTD  10/02/2020   IR PERCUTANEOUS ART THROMBECTOMY/INFUSION INTRACRANIAL INC DIAG ANGIO  10/02/2020   IR US GUIDE VASC ACCESS RIGHT  10/02/2020   NASAL  SINUS SURGERY     PATENT FORAMEN OVALE CLOSURE     PATENT FORAMEN OVALE CLOSURE     RADIOLOGY WITH ANESTHESIA N/A 10/02/2020   Procedure: IR WITH ANESTHESIA;  Surgeon: Luanne Bras, MD;  Location: [place];  Service: Radiology;  Laterality: N/A;   TEE WITHOUT CARDIOVERSION N/A 02/09/2016   Procedure: TRANSESOPHAGEAL ECHOCARDIOGRAM (TEE);  Surgeon: Adrian Prows, MD;  Location: [place];  Service: Cardiovascular;  Laterality: N/A;   TEE WITHOUT CARDIOVERSION N/A 11/12/2019   Procedure: TRANSESOPHAGEAL ECHOCARDIOGRAM (TEE);  Surgeon: Minna Merritts, MD;  Location: [facility];  Service: Cardiovascular;  Laterality: N/A;   TEE WITHOUT CARDIOVERSION N/A 02/06/2020   Procedure: TRANSESOPHAGEAL ECHOCARDIOGRAM (TEE);  Surgeon: Sanda , MD;  Location: [facility];  Service: Cardiovascular;   Laterality: N/A;    Current Outpatient Medications  Medication Sig Dispense Refill   ALPRAZolam (XANAX) 0.25 MG tablet Take 0.25 mg by mouth 2 (two) times daily as needed for anxiety.     amLODipine (NORVASC) 10 MG tablet Take 10 mg by mouth every evening.      atorvastatin (LIPITOR) 10 MG tablet      buPROPion (WELLBUTRIN SR) 150 MG 12 hr tablet Take 1 tablet (150 mg total) by mouth 2 (two) times daily. 60 tablet 5   cholecalciferol (VITAMIN D3) 25 MCG (1000 UT) tablet Take 1,000 Units by mouth daily.     desvenlafaxine (PRISTIQ) 100 MG 24 hr tablet Take 100 mg by mouth daily.     ELIQUIS 5 MG TABS tablet TAKE 1 TABLET (5 MG TOTAL) BY MOUTH 2 (TWO) TIMES DAILY. 180 tablet 1   ferrous sulfate 325 (65 FE) MG tablet Take 325 mg by mouth every evening.     fluticasone (FLONASE) 50 MCG/ACT nasal spray Place into both nostrils daily as needed for allergies or rhinitis.     gabapentin (NEURONTIN) 100 MG capsule Take by mouth.     levothyroxine (SYNTHROID, LEVOTHROID) 50 MCG tablet Take 50 mcg by mouth daily before breakfast.     metFORMIN (GLUCOPHAGE) 1000 MG tablet Take 1,000 mg by mouth daily with breakfast.      pantoprazole (PROTONIX) 40 MG tablet Take 40 mg by mouth 2 (two) times daily. Reported on 04/17/2016     rosuvastatin (CRESTOR) 40 MG tablet Take 1 tablet (40 mg total) by mouth daily. 30 tablet 2   traZODone (DESYREL) 50 MG tablet Take 50 mg by mouth at bedtime as needed for sleep.      vitamin B-12 (CYANOCOBALAMIN) 500 MCG tablet Take 500 mcg by mouth every evening.      No current facility-administered medications for this visit.    Allergies  Allergen Reactions   Alendronate Other (See Comments)    Rash    Penicillins Rash    Also swelling and itching  Did it involve swelling of the face/tongue/throat, SOB, or low BP? Yes Did it involve sudden or severe rash/hives, skin peeling, or any reaction on the inside of your mouth or nose? Yes Did you need to seek medical attention  at a hospital or doctor's office? Yes When did it last happen? More than 15 years ago If all above answers are "NO", may proceed with cephalosporin use.       Review of Systems negative except from HPI and PMH  Physical Exam Ht '5\' 5"'$  (1.651 m)   BMI 18.14 kg/m  Well developed and nourished in no acute distress HENT normal Neck supple with JVP-  flat  Clear Regular rate and  rhythm, no murmurs or gallops Abd-soft with active BS No Clubbing cyanosis edema Skin-warm and dry A & speech is apraxic grossly normal sensory and motor function  ECG sinus at 75 Intervals 19/08/39  Assessment and  Plan\ Cryptogenic stroke  Hypertension  Subclinical atrial fibrillation  Sinus bradycardia-resolved  PFO-Amplatzer closure  Tobacco abuse  Aggravation with stroke residua  Hyperlipidemia  Renal insufficiecny   Blood pressure is well controlled.  We will continue her on amlodipine 10.  No sense from her daughter that her blood pressure is too low  No interval atrial fibrillation of which we are aware.  No longer on amiodarone because of nausea. Tolerating Eliquis without bleeding.  There was a period when she stopped taking it because of nausea.  Her renal function dictates a reduction in her dose from 5--2.5 twice daily  Her last LDL was 188 this was on rosuvastatin 40.  I will reach out to her primary care physician to consider PCSK9.     Discussed tools for medication compliance including Bluetooth technologies

## 2021-07-14 NOTE — Patient Instructions (Signed)
Medication Instructions:  - Your physician has recommended you make the following change in your medication:   1) DECREASE Eliquis to 2.5 mg- take 1 tablet by mouth TWICE daily   *If you need a refill on your cardiac medications before your next appointment, please call your pharmacy*   Lab Work: - none ordered  If you have labs (blood work) drawn today and your tests are completely normal, you will receive your results only by: [place] (if you have MyChart) OR A paper copy in the mail If you have any lab test that is abnormal or we need to change your treatment, we will call you to review the results.   Testing/Procedures: - none ordered   Follow-Up: At [facility], you and your health needs are our priority.  As part of our continuing mission to provide you with exceptional heart care, we have created designated Provider Care Teams.  These Care Teams include your primary Cardiologist (physician) and Advanced Practice Providers (APPs -  Physician Assistants and Nurse Practitioners) who all work together to provide you with the care you need, when you need it.  We recommend signing up for the patient portal called "MyChart".  Sign up information is provided on this After Visit Summary.  MyChart is used to connect with patients for Virtual Visits (Telemedicine).  Patients are able to view lab/test results, encounter notes, upcoming appointments, etc.  Non-urgent messages can be sent to your provider as well.   To learn more about what you can do with MyChart, go to [email or web address].    Your next appointment:   1 year(s)  The format for your next appointment:   In Person  Provider:   Virl Axe, MD   Other Instructions N/a

## 2021-07-25 ENCOUNTER — Telehealth: Payer: Self-pay | Admitting: Student

## 2021-07-25 NOTE — Telephone Encounter (Signed)
Spoke with husband to offer to schedule a Palliative f/u visit and he declined at this time.  Husband stated that the patient said that she is doing fine and they were on their way to the beach and won't be back until around 08/25/21 and husband stated that I could check back in with them at that time.

## 2021-10-24 ENCOUNTER — Emergency Department: Payer: Medicare HMO

## 2021-10-24 ENCOUNTER — Other Ambulatory Visit: Payer: Self-pay

## 2021-10-24 ENCOUNTER — Emergency Department
Admission: EM | Admit: 2021-10-24 | Discharge: 2021-10-24 | Disposition: A | Discharge status: Home / Self Care | Payer: Medicare HMO | Attending: Emergency Medicine | Admitting: Emergency Medicine

## 2021-10-24 DIAGNOSIS — R531 Weakness: Secondary | ICD-10-CM

## 2021-10-24 DIAGNOSIS — I4891 Unspecified atrial fibrillation: Secondary | ICD-10-CM | POA: Diagnosis not present

## 2021-10-24 DIAGNOSIS — N183 Chronic kidney disease, stage 3 unspecified: Secondary | ICD-10-CM | POA: Insufficient documentation

## 2021-10-24 DIAGNOSIS — Z7901 Long term (current) use of anticoagulants: Secondary | ICD-10-CM | POA: Insufficient documentation

## 2021-10-24 DIAGNOSIS — I10 Essential (primary) hypertension: Secondary | ICD-10-CM | POA: Diagnosis not present

## 2021-10-24 DIAGNOSIS — I129 Hypertensive chronic kidney disease with stage 1 through stage 4 chronic kidney disease, or unspecified chronic kidney disease: Secondary | ICD-10-CM | POA: Diagnosis not present

## 2021-10-24 DIAGNOSIS — U071 COVID-19: Secondary | ICD-10-CM | POA: Insufficient documentation

## 2021-10-24 DIAGNOSIS — R519 Headache, unspecified: Secondary | ICD-10-CM | POA: Diagnosis not present

## 2021-10-24 DIAGNOSIS — Z79899 Other long term (current) drug therapy: Secondary | ICD-10-CM | POA: Diagnosis not present

## 2021-10-24 DIAGNOSIS — F17210 Nicotine dependence, cigarettes, uncomplicated: Secondary | ICD-10-CM | POA: Insufficient documentation

## 2021-10-24 DIAGNOSIS — R051 Acute cough: Secondary | ICD-10-CM

## 2021-10-24 DIAGNOSIS — Z7984 Long term (current) use of oral hypoglycemic drugs: Secondary | ICD-10-CM | POA: Insufficient documentation

## 2021-10-24 DIAGNOSIS — E1122 Type 2 diabetes mellitus with diabetic chronic kidney disease: Secondary | ICD-10-CM | POA: Insufficient documentation

## 2021-10-24 DIAGNOSIS — E039 Hypothyroidism, unspecified: Secondary | ICD-10-CM | POA: Insufficient documentation

## 2021-10-24 DIAGNOSIS — R059 Cough, unspecified: Secondary | ICD-10-CM | POA: Diagnosis not present

## 2021-10-24 LAB — COMPREHENSIVE METABOLIC PANEL
ALT: 20 U/L (ref 0–44)
AST: 26 U/L (ref 15–41)
Albumin: 3.4 g/dL — ABNORMAL LOW (ref 3.5–5.0)
Alkaline Phosphatase: 141 U/L — ABNORMAL HIGH (ref 38–126)
Anion gap: 9 (ref 5–15)
BUN: 23 mg/dL (ref 8–23)
CO2: 21 mmol/L — ABNORMAL LOW (ref 22–32)
Calcium: 9.1 mg/dL (ref 8.9–10.3)
Chloride: 106 mmol/L (ref 98–111)
Creatinine, Ser: 1.76 mg/dL — ABNORMAL HIGH (ref 0.44–1.00)
GFR, Estimated: 30 mL/min — ABNORMAL LOW (ref >60)
Glucose, Bld: 154 mg/dL — ABNORMAL HIGH (ref 70–99)
Potassium: 4.5 mmol/L (ref 3.5–5.1)
Sodium: 136 mmol/L (ref 135–145)
Total Bilirubin: 0.3 mg/dL (ref 0.3–1.2)
Total Protein: 8 g/dL (ref 6.5–8.1)

## 2021-10-24 LAB — CBC WITH DIFFERENTIAL/PLATELET
Abs Immature Granulocytes: 0.02 10*3/uL (ref 0.00–0.07)
Basophils Absolute: 0 10*3/uL (ref 0.0–0.1)
Basophils Relative: 0 %
Eosinophils Absolute: 0 10*3/uL (ref 0.0–0.5)
Eosinophils Relative: 0 %
HCT: 29.7 % — ABNORMAL LOW (ref 36.0–46.0)
Hemoglobin: 9.8 g/dL — ABNORMAL LOW (ref 12.0–15.0)
Immature Granulocytes: 0 %
Lymphocytes Relative: 14 %
Lymphs Abs: 1.3 10*3/uL (ref 0.7–4.0)
MCH: 31.3 pg (ref 26.0–34.0)
MCHC: 33 g/dL (ref 30.0–36.0)
MCV: 94.9 fL (ref 80.0–100.0)
Monocytes Absolute: 0.6 10*3/uL (ref 0.1–1.0)
Monocytes Relative: 6 %
Neutro Abs: 7.1 10*3/uL (ref 1.7–7.7)
Neutrophils Relative %: 80 %
Platelets: 325 10*3/uL (ref 150–400)
RBC: 3.13 MIL/uL — ABNORMAL LOW (ref 3.87–5.11)
RDW: 13.8 % (ref 11.5–15.5)
WBC: 9 10*3/uL (ref 4.0–10.5)
nRBC: 0 % (ref 0.0–0.2)

## 2021-10-24 LAB — TROPONIN I (HIGH SENSITIVITY): Troponin I (High Sensitivity): 10 ng/L (ref <18)

## 2021-10-24 LAB — RESP PANEL BY RT-PCR (FLU A&B, COVID) ARPGX2
Influenza A by PCR: NEGATIVE
Influenza B by PCR: NEGATIVE
SARS Coronavirus 2 by RT PCR: POSITIVE — AB

## 2021-10-24 MED ORDER — MOLNUPIRAVIR EUA 200MG CAPSULE
4.0000 | ORAL_CAPSULE | Freq: Two times a day (BID) | ORAL | 0 refills | Status: AC
Start: 2021-10-24 — End: 2021-10-29

## 2021-10-24 MED ORDER — ACETAMINOPHEN 500 MG PO TABS
1000.0000 mg | ORAL_TABLET | Freq: Once | ORAL | Status: AC
  Administered 2021-10-24: 14:00:00 1000 mg via ORAL
  Filled 2021-10-24: qty 2

## 2021-10-24 MED ORDER — LACTATED RINGERS IV BOLUS
1000.0000 mL | Freq: Once | INTRAVENOUS | Status: AC
  Administered 2021-10-24: 12:00:00 1000 mL via INTRAVENOUS

## 2021-10-24 MED ORDER — GUAIFENESIN ER 600 MG PO TB12: 600.0000 mg | ORAL_TABLET | Freq: Two times a day (BID) | ORAL | 0 refills | Status: AC

## 2021-10-24 MED ORDER — ALBUTEROL SULFATE HFA 108 (90 BASE) MCG/ACT IN AERS: 2.0000 | INHALATION_SPRAY | Freq: Four times a day (QID) | RESPIRATORY_TRACT | 0 refills | Status: AC | PRN

## 2021-10-24 NOTE — ED Provider Notes (Signed)
[facility] Emergency Department Provider Note   ____________________________________________   Event Date/Time   First MD Initiated Contact with Patient 10/24/21 1101     (approximate)  I have reviewed the triage vital signs and the nursing notes.   HISTORY  Chief Complaint Cough and Nasal Congestion    HPI Erin Yates is a 75 y.o. female with past medical history of hypertension, diabetes, stroke, CKD, atrial fibrillation on Eliquis, and hypothyroidism who presents to the ED complaining of cough and congestion.  Patient reports that she has been dealing with 3 to 4 days of gradually worsening cough that is productive of greenish sputum.  She reports feeling feverish with chills, but has not taken her temperature at home.  She has felt mildly short of breath but denies any pain in her chest and has not noticed any pain or swelling in her legs.  She has not had any abdominal pain, dysuria, nausea, vomiting, or diarrhea.  Husband states that he was sick with similar symptoms 1 to 2 weeks ago, however he has since started feeling much better.  Patient has not been tested for COVID or flu, did speak with her PCPs office over the phone who prescribed Tessalon Perles and azithromycin without improvement in her symptoms.        Past Medical History:  Diagnosis Date   Anemia    past  hx   Anxiety    Depression    Diabetes mellitus without complication ([place])    Dysrhythmia    prior to PFO correction   GERD (gastroesophageal reflux disease)    Headache    before PFO closure   Hypercholesteremia    Hypertension    Hypothyroidism    Peripheral vision loss    residual from CVA   PFO (patent foramen ovale)    correction device placed 2010   Right leg numbness    lower leg- residual from CVA   Seasonal allergies    Stroke [facility])    prior to PFO correction    Patient Active Problem List   Diagnosis Date Noted   History of embolic stroke 84/13/2440    Agitation 10/12/2020   Acute delirium 10/12/2020   CKD (chronic kidney disease), stage IIIa ([place]) 10/12/2020   AKI (acute kidney injury) ([place]) 10/12/2020   Aortic atherosclerosis ([place]) 10/12/2020   Elevated troponin 10/12/2020   Anemia 10/12/2020   Acute R MCA ischemic stroke (HCC) s/p clot retrieval, likely embolic d/t AF even though on AC    Controlled type 2 diabetes mellitus with hyperglycemia ([place])    Essential hypertension    Atrial fibrillation with RVR (HCC)    Dysphagia, post-stroke    Mitral valve disorder    AF (paroxysmal atrial fibrillation) ([place]) 11/13/2019   CVA (cerebral vascular accident) ([place]) 11/11/2019   Stroke ([place]) 11/10/2019   Type II diabetes mellitus with renal manifestations ([place]) 11/10/2019   Depression    GERD (gastroesophageal reflux disease)    Hypertension    Hypothyroidism    Hyperlipidemia LDL goal <70    Tobacco abuse    Cryptogenic stroke ([place]) 03/27/2016   Stroke (cerebrum) (HCC)    CVA (cerebral infarction) 02/07/2016    Past Surgical History:  Procedure Laterality Date   ABDOMINAL HYSTERECTOMY     BUBBLE STUDY  02/06/2020   Procedure: BUBBLE STUDY;  Surgeon: Sanda Klein, MD;  [facility];  Service: Cardiovascular;;   CARDIAC CATHETERIZATION     [facility]   CATARACT EXTRACTION  W/PHACO Right 09/01/2015   Procedure: CATARACT EXTRACTION PHACO AND INTRAOCULAR LENS PLACEMENT (IOC);  Surgeon: Leandrew Koyanagi, MD;  Location: [place];  Service: Ophthalmology;  Laterality: Right;  DIABETIC - oral meds   CATARACT EXTRACTION W/PHACO Left 10/20/2015   Procedure: CATARACT EXTRACTION PHACO AND INTRAOCULAR LENS PLACEMENT (IOC);  Surgeon: Leandrew Koyanagi, MD;  Location: [place];  Service: Ophthalmology;  Laterality: Left;  DIABETIC - oral meds   EP IMPLANTABLE DEVICE N/A 04/04/2016   Procedure: Loop Recorder Insertion;  Surgeon: Deboraha Sprang, MD;  Location: [place] [facility];  Service: Cardiovascular;   Laterality: N/A;   FOOT NEUROMA SURGERY Left    IR CT HEAD LTD  10/02/2020   IR PERCUTANEOUS ART THROMBECTOMY/INFUSION INTRACRANIAL INC DIAG ANGIO  10/02/2020   IR US GUIDE VASC ACCESS RIGHT  10/02/2020   NASAL SINUS SURGERY     PATENT FORAMEN OVALE CLOSURE     PATENT FORAMEN OVALE CLOSURE     RADIOLOGY WITH ANESTHESIA N/A 10/02/2020   Procedure: IR WITH ANESTHESIA;  Surgeon: Luanne Bras, MD;  Location: [place];  Service: Radiology;  Laterality: N/A;   TEE WITHOUT CARDIOVERSION N/A 02/09/2016   Procedure: TRANSESOPHAGEAL ECHOCARDIOGRAM (TEE);  Surgeon: Adrian Prows, MD;  Location: [place];  Service: Cardiovascular;  Laterality: N/A;   TEE WITHOUT CARDIOVERSION N/A 11/12/2019   Procedure: TRANSESOPHAGEAL ECHOCARDIOGRAM (TEE);  Surgeon: Minna Merritts, MD;  Location: [facility];  Service: Cardiovascular;  Laterality: N/A;   TEE WITHOUT CARDIOVERSION N/A 02/06/2020   Procedure: TRANSESOPHAGEAL ECHOCARDIOGRAM (TEE);  Surgeon: Sanda Klein, MD;  Location: [place];  Service: Cardiovascular;  Laterality: N/A;    Prior to Admission medications   Medication Sig Start Date End Date Taking? Authorizing Provider  albuterol (VENTOLIN HFA) 108 (90 Base) MCG/ACT inhaler Inhale 2 puffs into the lungs every 6 (six) hours as needed for wheezing or shortness of breath. 10/24/21  Yes Blake Divine, MD  guaiFENesin (MUCINEX) 600 MG 12 hr tablet Take 1 tablet (600 mg total) by mouth 2 (two) times daily for 15 days. 10/24/21 11/08/21 Yes Blake Divine, MD  molnupiravir EUA (LAGEVRIO) 200 mg CAPS capsule Take 4 capsules (800 mg total) by mouth 2 (two) times daily for 5 days. 10/24/21 10/29/21 Yes Blake Divine, MD  ALPRAZolam Duanne Moron) 0.25 MG tablet Take 0.25 mg by mouth 2 (two) times daily as needed for anxiety.    [provider]  amLODipine (NORVASC) 10 MG tablet Take 10 mg by mouth every evening.     [provider]  apixaban (ELIQUIS) 2.5 MG TABS tablet Take 1 tablet (2.5 mg  total) by mouth 2 (two) times daily. 07/14/21   Deboraha Sprang, MD  buPROPion [facility] SR) 150 MG 12 hr tablet Take 1 tablet (150 mg total) by mouth 2 (two) times daily. 03/27/16   Garvin Fila, MD  cholecalciferol (VITAMIN D3) 25 MCG (1000 UT) tablet Take 1,000 Units by mouth daily.    [provider]  desvenlafaxine (PRISTIQ) 100 MG 24 hr tablet Take 100 mg by mouth daily. 12/05/19   [provider]  ferrous sulfate 325 (65 FE) MG tablet Take 325 mg by mouth every evening.    [provider]  fluticasone (FLONASE) 50 MCG/ACT nasal spray Place into both nostrils daily as needed for allergies or rhinitis.    [provider]  gabapentin (NEURONTIN) 100 MG capsule Take by mouth. Patient not taking: No sig reported 12/20/20 12/20/21  [provider]  levothyroxine (SYNTHROID, [place]) 50  MCG tablet Take 50 mcg by mouth daily before breakfast.    [provider]  metFORMIN (GLUCOPHAGE) 1000 MG tablet Take 1,000 mg by mouth daily with breakfast.  Patient not taking: No sig reported    [provider]  pantoprazole (PROTONIX) 40 MG tablet Take 40 mg by mouth 2 (two) times daily. Reported on 04/17/2016    [provider]  rosuvastatin (CRESTOR) 40 MG tablet Take 1 tablet (40 mg total) by mouth daily. 10/13/20   Donzetta Starch, NP  traZODone (DESYREL) 50 MG tablet Take 50 mg by mouth at bedtime as needed for sleep.     [provider]  vitamin B-12 (CYANOCOBALAMIN) 500 MCG tablet Take 500 mcg by mouth every evening.     [provider]    Allergies Alendronate and Penicillins  Family History  Problem Relation Age of Onset   Dementia Father    Breast cancer Sister 70    Social History Social History   Tobacco Use   Smoking status: Every Day    Packs/day: 1.00    Years: 55.00    Pack years: 55.00    Types: Cigarettes   Smokeless tobacco: Never  Vaping Use   Vaping Use: Never used  Substance Use  Topics   Alcohol use: No   Drug use: No    Review of Systems  Constitutional: Positive for subjective fever/chills.  Positive for generalized weakness and malaise. Eyes: No visual changes. ENT: No sore throat. Cardiovascular: Denies chest pain. Respiratory: Positive for cough and shortness of breath. Gastrointestinal: No abdominal pain.  No nausea, no vomiting.  No diarrhea.  No constipation. Genitourinary: Negative for dysuria. Musculoskeletal: Negative for back pain. Skin: Negative for rash. Neurological: Negative for headaches, focal weakness or numbness.  ____________________________________________   PHYSICAL EXAM:  VITAL SIGNS: ED Triage Vitals  Enc Vitals Group     BP 10/24/21 1056 (!) 120/54     Pulse Rate 10/24/21 1056 83     Resp 10/24/21 1056 (!) 22     Temp 10/24/21 1056 98.4 F (36.9 C)     Temp Source 10/24/21 1056 Oral     SpO2 10/24/21 1056 95 %     Weight 10/24/21 1057 110 lb (49.9 kg)     Height --      Head Circumference --      Peak Flow --      Pain Score 10/24/21 1057 8     Pain Loc --      Pain Edu? --      Excl. in [place]? --     Constitutional: Alert and oriented. Eyes: Conjunctivae are normal. Head: Atraumatic. Nose: No congestion/rhinnorhea. Mouth/Throat: Mucous membranes are moist. Neck: Normal ROM Cardiovascular: Normal rate, regular rhythm. Grossly normal heart sounds.  2+ radial pulses bilaterally. Respiratory: Normal respiratory effort.  No retractions. Lungs CTAB. Gastrointestinal: Soft and nontender. No distention. Genitourinary: deferred Musculoskeletal: No lower extremity tenderness nor edema. Neurologic:  Normal speech and language. No gross focal neurologic deficits are appreciated.  Tremors noted. Skin:  Skin is warm, dry and intact. No rash noted. Psychiatric: Mood and affect are normal. Speech and behavior are normal.  ____________________________________________   LABS (all labs ordered are listed, but only abnormal  results are displayed)  Labs Reviewed  RESP PANEL BY RT-PCR (FLU A&B, COVID) ARPGX2 - Abnormal; Notable for the following components:      Result Value   SARS Coronavirus 2 by RT PCR POSITIVE (*)    All  other components within normal limits  CBC WITH DIFFERENTIAL/PLATELET - Abnormal; Notable for the following components:   RBC 3.13 (*)    Hemoglobin 9.8 (*)    HCT 29.7 (*)    All other components within normal limits  COMPREHENSIVE METABOLIC PANEL - Abnormal; Notable for the following components:   CO2 21 (*)    Glucose, Bld 154 (*)    Creatinine, Ser 1.76 (*)    Albumin 3.4 (*)    Alkaline Phosphatase 141 (*)    GFR, Estimated 30 (*)    All other components within normal limits  TROPONIN I (HIGH SENSITIVITY)  TROPONIN I (HIGH SENSITIVITY)   ____________________________________________  EKG  ED ECG REPORT I, Blake Divine, the attending physician, personally viewed and interpreted this ECG.   Date: 10/24/2021  EKG Time: 11:10  Rate: 71  Rhythm: normal sinus rhythm  Axis: Normal  Intervals:none  ST&T Change: None   PROCEDURES  Procedure(s) performed (including Critical Care):  Procedures   ____________________________________________   INITIAL IMPRESSION / ASSESSMENT AND PLAN / ED COURSE      75 year old female with past medical history of hypertension, diabetes, stroke, CKD, hypothyroidism, and atrial fibrillation on Eliquis who presents to the ED complaining of increasing productive cough over the past 3 to 4 days with mild difficulty breathing, subjective fevers and chills.  Patient is not in any respiratory distress and is maintaining O2 sats on room air.  She is quite tremulous but husband states this is not unusual for her since her stroke whenever she begins to feel ill.  Symptoms concerning for pneumonia versus viral process, plan to check chest x-ray as well as testing for COVID-19 and influenza.  We will suspicion for cardiac etiology but we will check  EKG and troponin.  We will hydrate with IV fluids and reassess.  EKG shows no evidence of arrhythmia or ischemia and troponin is within normal limits.  Chest x-ray reviewed by me and shows no infiltrate, edema, or effusion, does have bronchitic changes noted by radiology.  Remainder of labs are unremarkable, viral testing is positive for COVID-19, which would explain patient's symptoms.  She continues to breathe comfortably and maintain O2 sats on room air and is appropriate for outpatient management.  We will avoid Paxlovid given her impaired renal function and potential for interaction with her anticoagulation.  She was counseled to follow-up with her PCP and to return to the ED for new worsening symptoms, patient agrees with plan.      ____________________________________________   FINAL CLINICAL IMPRESSION(S) / ED DIAGNOSES  Final diagnoses:  COVID-19  Acute cough  Generalized weakness     ED Discharge Orders          Ordered    albuterol (VENTOLIN HFA) 108 (90 Base) MCG/ACT inhaler  Every 6 hours PRN       Note to Pharmacy: Please supply with spacer   10/24/21 1340    guaiFENesin (MUCINEX) 600 MG 12 hr tablet  2 times daily        10/24/21 1340    molnupiravir EUA (LAGEVRIO) 200 mg CAPS capsule  2 times daily        10/24/21 1340             Note:  This document was prepared using Dragon voice recognition software and may include unintentional dictation errors.    Blake Divine, MD 10/24/21 1343

## 2021-10-24 NOTE — ED Triage Notes (Signed)
Pt in with co cough, congestion, headache, body aches, unsure of fever. STarted on Zithromax and tessalon perles per EDP last week without improvement. Pt shaky in triage, denies any n.v.d at this time.

## 2021-11-03 DIAGNOSIS — E034 Atrophy of thyroid (acquired): Secondary | ICD-10-CM | POA: Diagnosis not present

## 2021-11-03 DIAGNOSIS — I700 Atherosclerosis of aorta: Secondary | ICD-10-CM | POA: Diagnosis not present

## 2021-11-03 DIAGNOSIS — I480 Paroxysmal atrial fibrillation: Secondary | ICD-10-CM | POA: Diagnosis not present

## 2021-11-03 DIAGNOSIS — D6869 Other thrombophilia: Secondary | ICD-10-CM | POA: Diagnosis not present

## 2021-11-03 DIAGNOSIS — I739 Peripheral vascular disease, unspecified: Secondary | ICD-10-CM | POA: Diagnosis not present

## 2021-11-03 DIAGNOSIS — R809 Proteinuria, unspecified: Secondary | ICD-10-CM | POA: Diagnosis not present

## 2021-11-03 DIAGNOSIS — D519 Vitamin B12 deficiency anemia, unspecified: Secondary | ICD-10-CM | POA: Diagnosis not present

## 2021-11-03 DIAGNOSIS — D649 Anemia, unspecified: Secondary | ICD-10-CM | POA: Diagnosis not present

## 2021-11-03 DIAGNOSIS — E7849 Other hyperlipidemia: Secondary | ICD-10-CM | POA: Diagnosis not present

## 2021-11-03 DIAGNOSIS — F3342 Major depressive disorder, recurrent, in full remission: Secondary | ICD-10-CM | POA: Diagnosis not present

## 2021-11-03 DIAGNOSIS — I10 Essential (primary) hypertension: Secondary | ICD-10-CM | POA: Diagnosis not present

## 2021-11-03 DIAGNOSIS — E1129 Type 2 diabetes mellitus with other diabetic kidney complication: Secondary | ICD-10-CM | POA: Diagnosis not present

## 2021-11-03 DIAGNOSIS — U071 COVID-19: Secondary | ICD-10-CM | POA: Diagnosis not present

## 2021-11-03 DIAGNOSIS — I129 Hypertensive chronic kidney disease with stage 1 through stage 4 chronic kidney disease, or unspecified chronic kidney disease: Secondary | ICD-10-CM | POA: Diagnosis not present

## 2021-11-09 DIAGNOSIS — D649 Anemia, unspecified: Secondary | ICD-10-CM | POA: Diagnosis not present

## 2021-11-10 DIAGNOSIS — N1832 Chronic kidney disease, stage 3b: Secondary | ICD-10-CM | POA: Diagnosis not present

## 2021-11-10 DIAGNOSIS — D692 Other nonthrombocytopenic purpura: Secondary | ICD-10-CM | POA: Diagnosis not present

## 2021-11-10 DIAGNOSIS — I700 Atherosclerosis of aorta: Secondary | ICD-10-CM | POA: Diagnosis not present

## 2021-11-10 DIAGNOSIS — I10 Essential (primary) hypertension: Secondary | ICD-10-CM | POA: Diagnosis not present

## 2021-11-10 DIAGNOSIS — I129 Hypertensive chronic kidney disease with stage 1 through stage 4 chronic kidney disease, or unspecified chronic kidney disease: Secondary | ICD-10-CM | POA: Diagnosis not present

## 2021-11-10 DIAGNOSIS — E1129 Type 2 diabetes mellitus with other diabetic kidney complication: Secondary | ICD-10-CM | POA: Diagnosis not present

## 2021-11-10 DIAGNOSIS — D75839 Thrombocytosis, unspecified: Secondary | ICD-10-CM | POA: Diagnosis not present

## 2021-11-10 DIAGNOSIS — I480 Paroxysmal atrial fibrillation: Secondary | ICD-10-CM | POA: Diagnosis not present

## 2021-11-10 DIAGNOSIS — I739 Peripheral vascular disease, unspecified: Secondary | ICD-10-CM | POA: Diagnosis not present

## 2021-11-10 DIAGNOSIS — Z1389 Encounter for screening for other disorder: Secondary | ICD-10-CM | POA: Diagnosis not present

## 2021-11-10 DIAGNOSIS — E785 Hyperlipidemia, unspecified: Secondary | ICD-10-CM | POA: Diagnosis not present

## 2021-11-10 DIAGNOSIS — Z0000 Encounter for general adult medical examination without abnormal findings: Secondary | ICD-10-CM | POA: Diagnosis not present

## 2021-11-10 DIAGNOSIS — R809 Proteinuria, unspecified: Secondary | ICD-10-CM | POA: Diagnosis not present

## 2021-11-10 DIAGNOSIS — D509 Iron deficiency anemia, unspecified: Secondary | ICD-10-CM | POA: Diagnosis not present

## 2021-11-10 DIAGNOSIS — F3342 Major depressive disorder, recurrent, in full remission: Secondary | ICD-10-CM | POA: Diagnosis not present

## 2021-12-29 ENCOUNTER — Telehealth: Payer: Self-pay

## 2021-12-29 NOTE — Telephone Encounter (Signed)
Palliative volunteer call to check on patient. Patient reports doing well

## 2022-02-02 DIAGNOSIS — E1129 Type 2 diabetes mellitus with other diabetic kidney complication: Secondary | ICD-10-CM | POA: Diagnosis not present

## 2022-02-02 DIAGNOSIS — E034 Atrophy of thyroid (acquired): Secondary | ICD-10-CM | POA: Diagnosis not present

## 2022-02-02 DIAGNOSIS — E7849 Other hyperlipidemia: Secondary | ICD-10-CM | POA: Diagnosis not present

## 2022-02-02 DIAGNOSIS — R809 Proteinuria, unspecified: Secondary | ICD-10-CM | POA: Diagnosis not present

## 2022-02-02 DIAGNOSIS — D75839 Thrombocytosis, unspecified: Secondary | ICD-10-CM | POA: Diagnosis not present

## 2022-02-02 DIAGNOSIS — N1832 Chronic kidney disease, stage 3b: Secondary | ICD-10-CM | POA: Diagnosis not present

## 2022-02-02 DIAGNOSIS — D509 Iron deficiency anemia, unspecified: Secondary | ICD-10-CM | POA: Diagnosis not present

## 2022-02-09 DIAGNOSIS — F3342 Major depressive disorder, recurrent, in full remission: Secondary | ICD-10-CM | POA: Diagnosis not present

## 2022-02-09 DIAGNOSIS — I700 Atherosclerosis of aorta: Secondary | ICD-10-CM | POA: Diagnosis not present

## 2022-02-09 DIAGNOSIS — E1151 Type 2 diabetes mellitus with diabetic peripheral angiopathy without gangrene: Secondary | ICD-10-CM | POA: Diagnosis not present

## 2022-02-09 DIAGNOSIS — E1129 Type 2 diabetes mellitus with other diabetic kidney complication: Secondary | ICD-10-CM | POA: Diagnosis not present

## 2022-02-09 DIAGNOSIS — I480 Paroxysmal atrial fibrillation: Secondary | ICD-10-CM | POA: Diagnosis not present

## 2022-02-09 DIAGNOSIS — E785 Hyperlipidemia, unspecified: Secondary | ICD-10-CM | POA: Diagnosis not present

## 2022-02-09 DIAGNOSIS — I129 Hypertensive chronic kidney disease with stage 1 through stage 4 chronic kidney disease, or unspecified chronic kidney disease: Secondary | ICD-10-CM | POA: Diagnosis not present

## 2022-02-09 DIAGNOSIS — D6869 Other thrombophilia: Secondary | ICD-10-CM | POA: Diagnosis not present

## 2022-02-09 DIAGNOSIS — E034 Atrophy of thyroid (acquired): Secondary | ICD-10-CM | POA: Diagnosis not present

## 2022-03-16 ENCOUNTER — Other Ambulatory Visit: Payer: Medicare HMO

## 2022-03-16 DIAGNOSIS — Z515 Encounter for palliative care: Secondary | ICD-10-CM

## 2022-03-16 NOTE — Progress Notes (Signed)
PATIENT NAME: Erin Yates ?DOB: 06/23/1946 ?MRN: [id number] ? ?PRIMARY CARE PROVIDER: Adin Hector, MD ? ?RESPONSIBLE PARTY:  ?Acct ID - Guarantor Home Phone Work Phone Relationship Acct Type  ?[id number]AH, CASTELL AN* [phone]  Self P/F  ?   [address], [place], [place] [address]  ? ? ?Phone call made to Mr. Heldman-spouse and he has provided a contact number for patient.  Spoke with Mrs. Gombert who provides an update. ? ?Blood pressure:  Continues with amlodipine daily.  Patient checks her blood pressures periodically but endorses having difficulty remembering how to use the cuff.  Readings have been in the 120's/60's.   ? ?Diabetes:  Only checking as needed.  Patient endorses neuropathy in her hands and advised finger pricks are very painful.  Last check was in April and reading was 136. ? ?Functional Status:  Patient endorses having a rolling walker and cane that she only uses on a PRN basis.  No recent falls are reported. ? ?Follow up visit:  Patient is agreeable for a follow up visit with Charlann Boxer, NP.  Visit is requested for June as patient has family issues she is dealing with. ? ? ? ? ?Lorenza Burton, RN ? ?

## 2022-04-20 ENCOUNTER — Other Ambulatory Visit: Payer: Medicare HMO | Admitting: Student

## 2022-04-20 DIAGNOSIS — E46 Unspecified protein-calorie malnutrition: Secondary | ICD-10-CM

## 2022-04-20 DIAGNOSIS — F419 Anxiety disorder, unspecified: Secondary | ICD-10-CM

## 2022-04-20 DIAGNOSIS — Z515 Encounter for palliative care: Secondary | ICD-10-CM

## 2022-04-20 DIAGNOSIS — L299 Pruritus, unspecified: Secondary | ICD-10-CM

## 2022-04-20 NOTE — Progress Notes (Signed)
[facility] Palliative Care Consult Note Telephone: [phone]  Fax: [phone]    Date of encounter: 04/20/22 10:05 AM PATIENT NAME: Erin Yates [place] [id number]   [phone] (home)  DOB: 02-01-1946 MRN: [id number] PRIMARY CARE PROVIDER:    Adin Hector, MD,  [address] [facility] [place] [address] [phone]  REFERRING PROVIDER:   Adin Hector, MD [facility] [place],  [place] [address] [phone]  RESPONSIBLE PARTY:    Contact Information     Name Relation Home Work [place] Spouse [phone]     Deboraha Sprang Daughter   [phone]        I met face to face with patient and family in the home. Palliative Care was asked to follow this patient by consultation request of  Adin Hector, MD to address advance care planning and complex medical decision making. This is a follow up visit.                                   ASSESSMENT AND PLAN / RECOMMENDATIONS:   Advance Care Planning/Goals of Care: Goals include to maximize quality of life and symptom management. Patient/health care surrogate gave his/her permission to discuss. Our advance care planning conversation included a discussion about:    The value and importance of advance care planning  Experiences with loved ones who have been seriously ill or have died  Exploration of personal, cultural or spiritual beliefs that might influence medical decisions  Exploration of goals of care in the event of a sudden injury or illness  Discussed code status; family to discuss further.  CODE STATUS: Full Code  Palliative Medicine will continue to provide supportive care, symptom management as needed.   Symptom Management/Plan:  Anxiety/depression-Patient with worsening anxiety. She expresses worry over her grand daughter. Patient has a current order for alprazolam PRN, but  states she has not taken since April. Continue on Bupropion and Pristiq as directed.  We discussed her itching as well, likely due to her decreased kidney function. Will start hydroxyzine TID PRN. Education provided on medication and instructed to not take the alprazolam with the hydroxyzine; patient and husband verbalize understanding.  Pruritis- due to her anxiety, CKD. Start hydroxyzine 25 mg TID PRN, Sarna lotion apply daily.   Protein calorie malnutrition-patient endorses fair appetite; denies any further weight loss. Recommend foods she enjoys, nutritional supplement BID.   Follow up Palliative Care Visit: Palliative care will continue to follow for complex medical decision making, advance care planning, and clarification of goals. Return in 10-12 weeks or prn.   This visit was coded based on medical decision making (MDM).  PPS: 60%  HOSPICE ELIGIBILITY/DIAGNOSIS: TBD  Chief Complaint: Palliative Medicine follow up visit.   HISTORY OF PRESENT ILLNESS:  Erin Yates is a 76 y.o. year old female  with CKD 3b, atrial fibrillation, hypertension, hyperlipidemia, PVD, anxiety, depression, valvular heart disease, vitamin B12 deficiency, type 2 diabetes, GERD, history of CVA, TIAs.  Patient reports her appetite is getting better; she is drinking 1-2 supplements a day. Denies pain, shortness of breath. Fingertips are numb, so she rarely checks blood sugar.  Denies any hypo or hyper per glycemic symptoms.  She does endorse worsening  anxiety related to health issues her grand daughter is having.  She denies any functional declines. No falls reported. She denies pain, shortness of breath, nausea, constipation. A 10- point ROS is negative, except for the pertinent positives and negatives detailed per the HPI.   History obtained from review of EMR, discussion with primary team, and interview with family, facility staff/caregiver and/or Ms. Jepsen.  I reviewed available labs, medications, imaging,  studies and related documents from the EMR.  Records reviewed and summarized above.   Physical Exam: Pulse 78, resp 20, b/p 122/64, sats 97% on room air Constitutional: NAD General: frail appearing, thin EYES: anicteric sclera, lids intact, no discharge  ENMT: intact hearing, oral mucous membranes moist, dentition intact CV: S1S2, RRR, no LE edema Pulmonary: LCTA, no increased work of breathing, no cough, room air Abdomen: normo-active BS + 4 quadrants, soft and non tender, no ascites GU: deferred MSK:  sarcopenia, moves all extremities, ambulatory Skin: warm and dry, scabs to bilateral arms Neuro:  no generalized weakness, A & O x 3, forgetful Psych: anxious affect, pleasant Hem/lymph/immuno: no widespread bruising   Thank you for the opportunity to participate in the care of Ms. Mccaskill.  The palliative care team will continue to follow. Please call our office at [phone] if we can be of additional assistance.   Ezekiel Slocumb, NP   COVID-19 PATIENT SCREENING TOOL Asked and negative response unless otherwise noted:   Have you had symptoms of covid, tested positive or been in contact with someone with symptoms/positive test in the past 5-10 days? No

## 2022-05-10 DIAGNOSIS — E1129 Type 2 diabetes mellitus with other diabetic kidney complication: Secondary | ICD-10-CM | POA: Diagnosis not present

## 2022-05-10 DIAGNOSIS — I700 Atherosclerosis of aorta: Secondary | ICD-10-CM | POA: Diagnosis not present

## 2022-05-10 DIAGNOSIS — R809 Proteinuria, unspecified: Secondary | ICD-10-CM | POA: Diagnosis not present

## 2022-05-10 DIAGNOSIS — N1832 Chronic kidney disease, stage 3b: Secondary | ICD-10-CM | POA: Diagnosis not present

## 2022-05-10 DIAGNOSIS — E538 Deficiency of other specified B group vitamins: Secondary | ICD-10-CM | POA: Diagnosis not present

## 2022-05-10 DIAGNOSIS — E611 Iron deficiency: Secondary | ICD-10-CM | POA: Diagnosis not present

## 2022-05-10 DIAGNOSIS — D519 Vitamin B12 deficiency anemia, unspecified: Secondary | ICD-10-CM | POA: Diagnosis not present

## 2022-05-10 DIAGNOSIS — E7849 Other hyperlipidemia: Secondary | ICD-10-CM | POA: Diagnosis not present

## 2022-05-10 DIAGNOSIS — I480 Paroxysmal atrial fibrillation: Secondary | ICD-10-CM | POA: Diagnosis not present

## 2022-05-17 DIAGNOSIS — D6869 Other thrombophilia: Secondary | ICD-10-CM | POA: Diagnosis not present

## 2022-05-17 DIAGNOSIS — F339 Major depressive disorder, recurrent, unspecified: Secondary | ICD-10-CM | POA: Diagnosis not present

## 2022-05-17 DIAGNOSIS — E1122 Type 2 diabetes mellitus with diabetic chronic kidney disease: Secondary | ICD-10-CM | POA: Diagnosis not present

## 2022-05-17 DIAGNOSIS — N189 Chronic kidney disease, unspecified: Secondary | ICD-10-CM | POA: Diagnosis not present

## 2022-05-17 DIAGNOSIS — I129 Hypertensive chronic kidney disease with stage 1 through stage 4 chronic kidney disease, or unspecified chronic kidney disease: Secondary | ICD-10-CM | POA: Diagnosis not present

## 2022-05-17 DIAGNOSIS — M25512 Pain in left shoulder: Secondary | ICD-10-CM | POA: Diagnosis not present

## 2022-05-17 DIAGNOSIS — I700 Atherosclerosis of aorta: Secondary | ICD-10-CM | POA: Diagnosis not present

## 2022-05-17 DIAGNOSIS — I480 Paroxysmal atrial fibrillation: Secondary | ICD-10-CM | POA: Diagnosis not present

## 2022-05-17 DIAGNOSIS — G8929 Other chronic pain: Secondary | ICD-10-CM | POA: Diagnosis not present

## 2022-05-17 DIAGNOSIS — I69354 Hemiplegia and hemiparesis following cerebral infarction affecting left non-dominant side: Secondary | ICD-10-CM | POA: Diagnosis not present

## 2022-05-17 DIAGNOSIS — E1151 Type 2 diabetes mellitus with diabetic peripheral angiopathy without gangrene: Secondary | ICD-10-CM | POA: Diagnosis not present

## 2022-05-28 ENCOUNTER — Emergency Department: Payer: Medicare HMO

## 2022-05-28 ENCOUNTER — Inpatient Hospital Stay: Payer: Medicare HMO

## 2022-05-28 ENCOUNTER — Encounter: Payer: Self-pay | Admitting: Emergency Medicine

## 2022-05-28 ENCOUNTER — Inpatient Hospital Stay (HOSPITAL_COMMUNITY)
Admit: 2022-05-28 | Discharge: 2022-05-28 | Disposition: A | Discharge status: Home / Self Care | Payer: Medicare HMO | Attending: Internal Medicine | Admitting: Internal Medicine

## 2022-05-28 ENCOUNTER — Inpatient Hospital Stay
Admission: EM | Admit: 2022-05-28 | Discharge: 2022-05-31 | DRG: 065 | Disposition: A | Discharge status: Skilled Nursing Facility | Payer: Medicare HMO | Attending: Internal Medicine | Admitting: Internal Medicine

## 2022-05-28 ENCOUNTER — Other Ambulatory Visit: Payer: Self-pay

## 2022-05-28 DIAGNOSIS — I6931 Attention and concentration deficit following cerebral infarction: Secondary | ICD-10-CM | POA: Diagnosis not present

## 2022-05-28 DIAGNOSIS — R410 Disorientation, unspecified: Secondary | ICD-10-CM | POA: Diagnosis present

## 2022-05-28 DIAGNOSIS — K219 Gastro-esophageal reflux disease without esophagitis: Secondary | ICD-10-CM | POA: Diagnosis present

## 2022-05-28 DIAGNOSIS — I129 Hypertensive chronic kidney disease with stage 1 through stage 4 chronic kidney disease, or unspecified chronic kidney disease: Secondary | ICD-10-CM | POA: Diagnosis present

## 2022-05-28 DIAGNOSIS — J302 Other seasonal allergic rhinitis: Secondary | ICD-10-CM | POA: Diagnosis present

## 2022-05-28 DIAGNOSIS — Z8673 Personal history of transient ischemic attack (TIA), and cerebral infarction without residual deficits: Secondary | ICD-10-CM

## 2022-05-28 DIAGNOSIS — N1832 Chronic kidney disease, stage 3b: Secondary | ICD-10-CM | POA: Diagnosis not present

## 2022-05-28 DIAGNOSIS — F419 Anxiety disorder, unspecified: Secondary | ICD-10-CM | POA: Diagnosis present

## 2022-05-28 DIAGNOSIS — I480 Paroxysmal atrial fibrillation: Secondary | ICD-10-CM | POA: Diagnosis present

## 2022-05-28 DIAGNOSIS — I10 Essential (primary) hypertension: Secondary | ICD-10-CM | POA: Diagnosis present

## 2022-05-28 DIAGNOSIS — I639 Cerebral infarction, unspecified: Secondary | ICD-10-CM | POA: Diagnosis not present

## 2022-05-28 DIAGNOSIS — F32A Depression, unspecified: Secondary | ICD-10-CM | POA: Diagnosis present

## 2022-05-28 DIAGNOSIS — Z79899 Other long term (current) drug therapy: Secondary | ICD-10-CM

## 2022-05-28 DIAGNOSIS — I69311 Memory deficit following cerebral infarction: Secondary | ICD-10-CM | POA: Diagnosis not present

## 2022-05-28 DIAGNOSIS — E7800 Pure hypercholesterolemia, unspecified: Secondary | ICD-10-CM | POA: Diagnosis present

## 2022-05-28 DIAGNOSIS — Z20822 Contact with and (suspected) exposure to covid-19: Secondary | ICD-10-CM | POA: Diagnosis present

## 2022-05-28 DIAGNOSIS — Z66 Do not resuscitate: Secondary | ICD-10-CM | POA: Diagnosis not present

## 2022-05-28 DIAGNOSIS — D631 Anemia in chronic kidney disease: Secondary | ICD-10-CM | POA: Diagnosis not present

## 2022-05-28 DIAGNOSIS — I63532 Cerebral infarction due to unspecified occlusion or stenosis of left posterior cerebral artery: Principal | ICD-10-CM | POA: Diagnosis present

## 2022-05-28 DIAGNOSIS — R29702 NIHSS score 2: Secondary | ICD-10-CM | POA: Diagnosis not present

## 2022-05-28 DIAGNOSIS — R29818 Other symptoms and signs involving the nervous system: Secondary | ICD-10-CM | POA: Diagnosis not present

## 2022-05-28 DIAGNOSIS — F17210 Nicotine dependence, cigarettes, uncomplicated: Secondary | ICD-10-CM | POA: Diagnosis present

## 2022-05-28 DIAGNOSIS — I6389 Other cerebral infarction: Secondary | ICD-10-CM

## 2022-05-28 DIAGNOSIS — R270 Ataxia, unspecified: Secondary | ICD-10-CM | POA: Diagnosis present

## 2022-05-28 DIAGNOSIS — Z7901 Long term (current) use of anticoagulants: Secondary | ICD-10-CM

## 2022-05-28 DIAGNOSIS — Z90710 Acquired absence of both cervix and uterus: Secondary | ICD-10-CM | POA: Diagnosis not present

## 2022-05-28 DIAGNOSIS — Z79890 Hormone replacement therapy (postmenopausal): Secondary | ICD-10-CM

## 2022-05-28 DIAGNOSIS — N179 Acute kidney failure, unspecified: Secondary | ICD-10-CM | POA: Diagnosis not present

## 2022-05-28 DIAGNOSIS — R4182 Altered mental status, unspecified: Principal | ICD-10-CM

## 2022-05-28 DIAGNOSIS — R29704 NIHSS score 4: Secondary | ICD-10-CM | POA: Diagnosis present

## 2022-05-28 DIAGNOSIS — R7989 Other specified abnormal findings of blood chemistry: Secondary | ICD-10-CM | POA: Diagnosis present

## 2022-05-28 DIAGNOSIS — R42 Dizziness and giddiness: Secondary | ICD-10-CM | POA: Diagnosis not present

## 2022-05-28 DIAGNOSIS — Z7984 Long term (current) use of oral hypoglycemic drugs: Secondary | ICD-10-CM

## 2022-05-28 DIAGNOSIS — R519 Headache, unspecified: Secondary | ICD-10-CM | POA: Diagnosis present

## 2022-05-28 DIAGNOSIS — E119 Type 2 diabetes mellitus without complications: Secondary | ICD-10-CM

## 2022-05-28 DIAGNOSIS — H547 Unspecified visual loss: Secondary | ICD-10-CM | POA: Diagnosis present

## 2022-05-28 DIAGNOSIS — R29705 NIHSS score 5: Secondary | ICD-10-CM | POA: Diagnosis not present

## 2022-05-28 DIAGNOSIS — E039 Hypothyroidism, unspecified: Secondary | ICD-10-CM | POA: Diagnosis not present

## 2022-05-28 DIAGNOSIS — Z888 Allergy status to other drugs, medicaments and biological substances status: Secondary | ICD-10-CM

## 2022-05-28 DIAGNOSIS — E1122 Type 2 diabetes mellitus with diabetic chronic kidney disease: Secondary | ICD-10-CM | POA: Diagnosis present

## 2022-05-28 DIAGNOSIS — R55 Syncope and collapse: Secondary | ICD-10-CM | POA: Diagnosis not present

## 2022-05-28 DIAGNOSIS — Z720 Tobacco use: Secondary | ICD-10-CM | POA: Diagnosis not present

## 2022-05-28 DIAGNOSIS — D649 Anemia, unspecified: Secondary | ICD-10-CM | POA: Diagnosis present

## 2022-05-28 DIAGNOSIS — Z8774 Personal history of (corrected) congenital malformations of heart and circulatory system: Secondary | ICD-10-CM | POA: Diagnosis not present

## 2022-05-28 DIAGNOSIS — I69398 Other sequelae of cerebral infarction: Secondary | ICD-10-CM

## 2022-05-28 DIAGNOSIS — I6621 Occlusion and stenosis of right posterior cerebral artery: Secondary | ICD-10-CM | POA: Diagnosis not present

## 2022-05-28 DIAGNOSIS — E785 Hyperlipidemia, unspecified: Secondary | ICD-10-CM | POA: Diagnosis not present

## 2022-05-28 DIAGNOSIS — Z880 Allergy status to penicillin: Secondary | ICD-10-CM

## 2022-05-28 HISTORY — DX: Disorientation, unspecified: R41.0

## 2022-05-28 LAB — CBC WITH DIFFERENTIAL/PLATELET
Abs Immature Granulocytes: 0.03 10*3/uL (ref 0.00–0.07)
Basophils Absolute: 0 10*3/uL (ref 0.0–0.1)
Basophils Relative: 0 %
Eosinophils Absolute: 0 10*3/uL (ref 0.0–0.5)
Eosinophils Relative: 1 %
HCT: 33.2 % — ABNORMAL LOW (ref 36.0–46.0)
Hemoglobin: 10.6 g/dL — ABNORMAL LOW (ref 12.0–15.0)
Immature Granulocytes: 0 %
Lymphocytes Relative: 29 %
Lymphs Abs: 2.5 10*3/uL (ref 0.7–4.0)
MCH: 28.5 pg (ref 26.0–34.0)
MCHC: 31.9 g/dL (ref 30.0–36.0)
MCV: 89.2 fL (ref 80.0–100.0)
Monocytes Absolute: 0.4 10*3/uL (ref 0.1–1.0)
Monocytes Relative: 5 %
Neutro Abs: 5.5 10*3/uL (ref 1.7–7.7)
Neutrophils Relative %: 65 %
Platelets: 345 10*3/uL (ref 150–400)
RBC: 3.72 MIL/uL — ABNORMAL LOW (ref 3.87–5.11)
RDW: 15.6 % — ABNORMAL HIGH (ref 11.5–15.5)
WBC: 8.5 10*3/uL (ref 4.0–10.5)
nRBC: 0 % (ref 0.0–0.2)

## 2022-05-28 LAB — COMPREHENSIVE METABOLIC PANEL
ALT: 15 U/L (ref 0–44)
AST: 23 U/L (ref 15–41)
Albumin: 3.7 g/dL (ref 3.5–5.0)
Alkaline Phosphatase: 101 U/L (ref 38–126)
Anion gap: 10 (ref 5–15)
BUN: 27 mg/dL — ABNORMAL HIGH (ref 8–23)
CO2: 20 mmol/L — ABNORMAL LOW (ref 22–32)
Calcium: 9.9 mg/dL (ref 8.9–10.3)
Chloride: 109 mmol/L (ref 98–111)
Creatinine, Ser: 2.2 mg/dL — ABNORMAL HIGH (ref 0.44–1.00)
GFR, Estimated: 23 mL/min — ABNORMAL LOW (ref >60)
Glucose, Bld: 189 mg/dL — ABNORMAL HIGH (ref 70–99)
Potassium: 4.5 mmol/L (ref 3.5–5.1)
Sodium: 139 mmol/L (ref 135–145)
Total Bilirubin: 0.5 mg/dL (ref 0.3–1.2)
Total Protein: 7.8 g/dL (ref 6.5–8.1)

## 2022-05-28 LAB — TSH: TSH: 1.381 u[IU]/mL (ref 0.350–4.500)

## 2022-05-28 LAB — RESP PANEL BY RT-PCR (FLU A&B, COVID) ARPGX2
Influenza A by PCR: NEGATIVE
Influenza B by PCR: NEGATIVE
SARS Coronavirus 2 by RT PCR: NEGATIVE

## 2022-05-28 LAB — LIPID PANEL
Cholesterol: 208 mg/dL — ABNORMAL HIGH (ref 0–200)
HDL: 45 mg/dL (ref >40)
LDL Cholesterol: 110 mg/dL — ABNORMAL HIGH (ref 0–99)
Total CHOL/HDL Ratio: 4.6 RATIO
Triglycerides: 264 mg/dL — ABNORMAL HIGH (ref <150)
VLDL: 53 mg/dL — ABNORMAL HIGH (ref 0–40)

## 2022-05-28 LAB — URINALYSIS, ROUTINE W REFLEX MICROSCOPIC
Bilirubin Urine: NEGATIVE
Glucose, UA: NEGATIVE mg/dL
Hgb urine dipstick: NEGATIVE
Ketones, ur: NEGATIVE mg/dL
Leukocytes,Ua: NEGATIVE
Nitrite: NEGATIVE
Protein, ur: NEGATIVE mg/dL
Specific Gravity, Urine: 1.017 (ref 1.005–1.030)
pH: 5 (ref 5.0–8.0)

## 2022-05-28 LAB — AMMONIA: Ammonia: 26 umol/L (ref 9–35)

## 2022-05-28 LAB — TROPONIN I (HIGH SENSITIVITY)
Troponin I (High Sensitivity): 12 ng/L (ref <18)
Troponin I (High Sensitivity): 11 ng/L (ref <18)

## 2022-05-28 LAB — LACTIC ACID, PLASMA: Lactic Acid, Venous: 1.1 mmol/L (ref 0.5–1.9)

## 2022-05-28 LAB — HEMOGLOBIN A1C
Hgb A1c MFr Bld: 7 % — ABNORMAL HIGH (ref 4.8–5.6)
Mean Plasma Glucose: 154.2 mg/dL

## 2022-05-28 LAB — CBG MONITORING, ED: Glucose-Capillary: 79 mg/dL (ref 70–99)

## 2022-05-28 MED ORDER — STROKE: EARLY STAGES OF RECOVERY BOOK: Freq: Once | Status: DC

## 2022-05-28 MED ORDER — ACETAMINOPHEN 325 MG PO TABS
650.0000 mg | ORAL_TABLET | ORAL | Status: DC | PRN
  Administered 2022-05-29 (×2): 650 mg via ORAL
  Filled 2022-05-28 (×2): qty 2

## 2022-05-28 MED ORDER — IOHEXOL 350 MG/ML SOLN
75.0000 mL | Freq: Once | INTRAVENOUS | Status: AC | PRN
  Administered 2022-05-28: 60 mL via INTRAVENOUS

## 2022-05-28 MED ORDER — ASPIRIN 325 MG PO TABS
325.0000 mg | ORAL_TABLET | Freq: Once | ORAL | Status: AC
  Administered 2022-05-28: 325 mg via ORAL
  Filled 2022-05-28 (×2): qty 1

## 2022-05-28 MED ORDER — ACETAMINOPHEN 325 MG PO TABS
650.0000 mg | ORAL_TABLET | Freq: Once | ORAL | Status: AC
  Administered 2022-05-28: 650 mg via ORAL
  Filled 2022-05-28: qty 2

## 2022-05-28 MED ORDER — SODIUM CHLORIDE 0.9 % IV BOLUS
1000.0000 mL | Freq: Once | INTRAVENOUS | Status: AC
  Administered 2022-05-28: 1000 mL via INTRAVENOUS

## 2022-05-28 MED ORDER — ASPIRIN 81 MG PO CHEW
81.0000 mg | CHEWABLE_TABLET | Freq: Every day | ORAL | Status: AC
Start: 2022-05-29 — End: 2022-05-30
  Administered 2022-05-29 – 2022-05-30 (×2): 81 mg via ORAL
  Filled 2022-05-28 (×2): qty 1

## 2022-05-28 MED ORDER — ACETAMINOPHEN 325 MG RE SUPP: 650.0000 mg | RECTAL | Status: DC | PRN

## 2022-05-28 MED ORDER — ACETAMINOPHEN 160 MG/5ML PO SOLN
650.0000 mg | ORAL | Status: DC | PRN
Start: 2022-05-28 — End: 2022-05-29

## 2022-05-28 MED ORDER — CLOPIDOGREL BISULFATE 75 MG PO TABS
75.0000 mg | ORAL_TABLET | Freq: Every day | ORAL | Status: AC
Start: 2022-05-28 — End: 2022-05-30
  Administered 2022-05-28 – 2022-05-30 (×3): 75 mg via ORAL
  Filled 2022-05-28 (×3): qty 1

## 2022-05-28 MED ORDER — NICOTINE 21 MG/24HR TD PT24
21.0000 mg | MEDICATED_PATCH | Freq: Every day | TRANSDERMAL | Status: DC
Start: 2022-05-28 — End: 2022-05-31
  Administered 2022-05-28 – 2022-05-31 (×4): 21 mg via TRANSDERMAL
  Filled 2022-05-28 (×4): qty 1

## 2022-05-28 MED ORDER — INSULIN ASPART 100 UNIT/ML IJ SOLN
0.0000 [IU] | Freq: Three times a day (TID) | INTRAMUSCULAR | Status: DC
  Administered 2022-05-29 (×2): 1 [IU] via SUBCUTANEOUS
  Filled 2022-05-28 (×2): qty 1

## 2022-05-28 MED ORDER — INSULIN ASPART 100 UNIT/ML IJ SOLN
0.0000 [IU] | Freq: Every day | INTRAMUSCULAR | Status: DC
  Filled 2022-05-28: qty 1

## 2022-05-28 MED ORDER — SODIUM CHLORIDE 0.9 % IV SOLN: INTRAVENOUS | Status: DC

## 2022-05-28 MED ORDER — HEPARIN SODIUM (PORCINE) 5000 UNIT/ML IJ SOLN
5000.0000 [IU] | Freq: Three times a day (TID) | INTRAMUSCULAR | Status: DC
  Administered 2022-05-28 – 2022-05-29 (×3): 5000 [IU] via SUBCUTANEOUS
  Filled 2022-05-28 (×3): qty 1

## 2022-05-28 MED ORDER — LORAZEPAM 2 MG/ML IJ SOLN
2.0000 mg | Freq: Once | INTRAMUSCULAR | Status: AC | PRN
  Administered 2022-05-28: 2 mg via INTRAVENOUS
  Filled 2022-05-28: qty 1

## 2022-05-28 NOTE — ED Triage Notes (Signed)
Pt reports head and "feeling swimmy headed." Per daughter at bedside pt was last normal "about mid week last week."

## 2022-05-28 NOTE — H&P (Signed)
History and Physical    Erin Yates  OQH:[id number]  DOB: 1946-02-21  DOA: 05/28/2022  PCP: Adin Hector, MD Patient coming from: home  Chief Complaint: confusion, worsening balance  HPI:  Ms. Bencivenga is a 76 yo female with PMH CVA (residual vision deficits, concentration/focus/memory deficits, weakness), PFO (s/p closure with Amplatzer PFO device performed July 2010 by Dr. Aline Brochure, [facility]), DMII, HLD, HTN, hypothyroidism, PAF, depression/anxiety who presents with complaints of ataxia, dizziness, worsening confusion.  She is accompanied by her husband and daughter in the ER.  She has been progressively worsening over the past several days at home.  Her husband has tried giving her some Dramamine for dizziness with no relief.  She does take Dramamine occasionally approximately every couple weeks if feeling dizzy.  Her daughter states that she has appeared to become acutely worse in general but notably due to her confusion and memory impairment. On evaluation in the ER she was very anxious appearing and continuously asking for cigarettes.  She was fixated on wanting to go home in order to be able to smoke her cigarettes. Of note, she gets severe anxiety in the past undergoing MRIs and does not always complete them adequately. 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  She underwent emergent thrombectomy at that time after CTA revealed proximal left M2 occlusion.  She was continued on Eliquis at discharge after hospitalization. She was evaluated by cardiology after discharge for consideration of Watchman device.  She met with Dr. Quentin Ore December 2021 and she was not a candidate for Watchman due to interference of the Amplatzer plug of the interatrial septum. She was recommended to continue on anticoagulation.   She is now admitted for further stroke work-up due to her symptoms as noted. Long  discussion held in the ER as well regarding code status with her husband and daughter.  They had a very difficult time thinking about what patient would want, however at this time they are still wishing for patient to remain full code.  They will continue to discuss status especially as stroke work-up commences.  On evaluating patient, she was complaining of being almost blind.  At baseline she has very poor tunnel vision with no peripheral vision from her prior strokes.  She had a very difficult time even seeing one finger a few inches in front of her eyes while both open.  She essentially could not see a finger when either eye was closed.  Despite this, she has continued to still smoke at home.  Also, as noted she is also described to have a very worsening gait but was not described to be leaning towards any specific direction.  Her husband states she has been holding onto the walls to walk over the past couple days.  He also states that she cannot get her fork to her mouth when trying to eat as well.  Patient is right-handed.  I have personally briefly reviewed patient's old medical records in [facility] and discussed patient with the ER provider when appropriate/indicated.  Assessment and Plan: * Ataxia - concern for either new CVA vs progressive worsening of underlying extensive encephalomalacia (CT head on admission shows chronic right frontal lobe infarction with encephalomalacia, chronic left occipital lobe infarction with encephalomalacia, left frontal, parietal, occipital encephalomalacia) - obtain MRI brain if able; ativan ordered for sedation - check CTA head/neck (renal fxn will have to improve  further first prior to scan) - follow up echo - do not see eliquis on med rec; will clarify with family as well - check A1c, TSH, lipid  Confusion - See ataxia as well -Family states that she is more confused than usual.  Differential for etiology includes possible dehydration given  worsening renal function versus underlying infection (negative UA, clear CXR, and low suspicion for infection at this time) versus worsening underlying encephalomalacia and/or due to new stroke - continue IVF  - continue stroke workup - monitor mentation   Acute renal failure superimposed on stage 3b chronic kidney disease ([place]) - patient has history of CKD3b. Baseline creat ~ 1.5 - 1.7, eGFR 30-35 - patient presents with increase in creat >0.3 mg/dL above baseline, creat increase >1.5x baseline presumed to have occurred within past 7 days PTA -Presumed prerenal due to poor intake recently per husband - Creatinine 2.2 on admission - Start fluids and repeat BMP in a.m.   AF (paroxysmal atrial fibrillation) ([place]) - follows with cardiology, Dr. [name]; last office visit noted 07/14/2021 - At that time she was taken off of amiodarone due to nausea but continued on Eliquis which was dose reduced for renal function - clarifying if patient still on eliquis prior to admission   Tobacco abuse - Patient adamant and very anxious on wanting to smoke and wishing to go home just to do so -Husband states she smokes about 1 carton a week -Nicotine patch ordered -Patient has 0 interest or desire in trying to quit and given her poor functional status at baseline, likely little benefit with cessation at this time however tried to encourage family to consider her current code status in context of this  Normocytic anemia - Likely in setting of underlying CKD 3B - Baseline hemoglobin appears to be about 10 to 11 g/dL which she is currently at  History of embolic stroke - Last hospitalized November 2021 for acute right MCA stroke requiring emergent embolectomy  Essential hypertension - Hold BP meds now for permissive hypertension  DMII (diabetes mellitus, type 2) ([place]) - Check A1c - Continue SSI and CBG monitoring  Hypothyroidism - Check TSH and resume Synthroid after med rec complete    Code  Status: Full   DVT Prophylaxis: HSQ   Anticipated disposition is to: Pending clinical course  History: Past Medical History:  Diagnosis Date   Anemia    past  hx   Anxiety    Depression    Diabetes mellitus without complication ([place])    Dysrhythmia    prior to PFO correction   GERD (gastroesophageal reflux disease)    Headache    before PFO closure   Hypercholesteremia    Hypertension    Hypothyroidism    Peripheral vision loss    residual from CVA   PFO (patent foramen ovale)    correction device placed 2010   Right leg numbness    lower leg- residual from CVA   Seasonal allergies    Stroke [facility])    prior to PFO correction    Past Surgical History:  Procedure Laterality Date   ABDOMINAL HYSTERECTOMY     BUBBLE STUDY  02/06/2020   Procedure: BUBBLE STUDY;  Surgeon: Sanda Klein, MD;  Location: [place];  Service: Cardiovascular;;   CARDIAC CATHETERIZATION     [facility]   CATARACT EXTRACTION W/PHACO Right 09/01/2015   Procedure: CATARACT EXTRACTION PHACO AND INTRAOCULAR LENS PLACEMENT ([place]);  Surgeon: Leandrew Koyanagi, MD;  Location: [place];  Service: Ophthalmology;  Laterality: Right;  DIABETIC - oral meds   CATARACT EXTRACTION W/PHACO Left 10/20/2015   Procedure: CATARACT EXTRACTION PHACO AND INTRAOCULAR LENS PLACEMENT ([facility]);  Surgeon: Leandrew Koyanagi, MD;  Location: [place];  Service: Ophthalmology;  Laterality: Left;  DIABETIC - oral meds   EP IMPLANTABLE DEVICE N/A 04/04/2016   Procedure: Loop Recorder Insertion;  Surgeon: Deboraha Sprang, MD;  Location: [place] [facility];  Service: Cardiovascular;  Laterality: N/A;   FOOT NEUROMA SURGERY Left    IR CT HEAD LTD  10/02/2020   IR PERCUTANEOUS ART THROMBECTOMY/INFUSION INTRACRANIAL INC DIAG ANGIO  10/02/2020   IR US GUIDE VASC ACCESS RIGHT  10/02/2020   NASAL SINUS SURGERY     PATENT FORAMEN OVALE CLOSURE     PATENT FORAMEN OVALE CLOSURE     RADIOLOGY WITH ANESTHESIA N/A 10/02/2020    Procedure: IR WITH ANESTHESIA;  Surgeon: Luanne Bras, MD;  Location: [place];  Service: Radiology;  Laterality: N/A;   TEE WITHOUT CARDIOVERSION N/A 02/09/2016   Procedure: TRANSESOPHAGEAL ECHOCARDIOGRAM (TEE);  Surgeon: Adrian Prows, MD;  Location: [place];  Service: Cardiovascular;  Laterality: N/A;   TEE WITHOUT CARDIOVERSION N/A 11/12/2019   Procedure: TRANSESOPHAGEAL ECHOCARDIOGRAM (TEE);  Surgeon: Minna Merritts, MD;  Location: [facility];  Service: Cardiovascular;  Laterality: N/A;   TEE WITHOUT CARDIOVERSION N/A 02/06/2020   Procedure: TRANSESOPHAGEAL ECHOCARDIOGRAM (TEE);  Surgeon: Sanda Klein, MD;  Location: [facility];  Service: Cardiovascular;  Laterality: N/A;     reports that she has been smoking cigarettes. She has a 55.00 pack-year smoking history. She has never used smokeless tobacco. She reports that she does not drink alcohol and does not use drugs.  Allergies  Allergen Reactions   Alendronate Other (See Comments)    Rash    Penicillins Rash    Also swelling and itching  Did it involve swelling of the face/tongue/throat, SOB, or low BP? Yes Did it involve sudden or severe rash/hives, skin peeling, or any reaction on the inside of your mouth or nose? Yes Did you need to seek medical attention at a hospital or doctor's office? Yes When did it last happen? More than 15 years ago If all above answers are "NO", may proceed with cephalosporin use.     Family History  Problem Relation Age of Onset   Dementia Father    Breast cancer Sister 95   Home Medications: Prior to Admission medications   Medication Sig Start Date End Date Taking? Authorizing Provider  albuterol (VENTOLIN HFA) 108 (90 Base) MCG/ACT inhaler Inhale 2 puffs into the lungs every 6 (six) hours as needed for wheezing or shortness of breath. 10/24/21   Blake Divine, MD  ALPRAZolam Duanne Moron) 0.25 MG tablet Take 0.25 mg by mouth 2 (two) times daily as needed for anxiety.    [provider]  amLODipine (NORVASC) 10 MG tablet Take 10 mg by mouth every evening.     [provider]  atorvastatin (LIPITOR) 10 MG tablet Take 10 mg by mouth daily.    [provider]  atorvastatin (LIPITOR) 20 MG tablet Take 20 mg by mouth daily. 05/17/22   [provider]  buPROPion (WELLBUTRIN SR) 150 MG 12 hr tablet Take 1 tablet (150 mg total) by mouth 2 (two) times daily. 03/27/16   Garvin Fila, MD  cholecalciferol (VITAMIN D3) 25 MCG (1000 UT) tablet Take 1,000 Units by mouth daily.    [provider]  desvenlafaxine (PRISTIQ) 100 MG 24 hr tablet Take 100  mg by mouth daily. 12/05/19   [provider]  ferrous sulfate 325 (65 FE) MG tablet Take 325 mg by mouth every evening.    [provider]  fluticasone (FLONASE) 50 MCG/ACT nasal spray Place into both nostrils daily as needed for allergies or rhinitis.    [provider]  gabapentin (NEURONTIN) 100 MG capsule Take by mouth. Patient not taking: No sig reported 12/20/20 12/20/21  [provider]  hydrOXYzine (ATARAX) 25 MG tablet Take 25 mg by mouth 3 (three) times daily as needed. 04/20/22   [provider]  levothyroxine (SYNTHROID, LEVOTHROID) 50 MCG tablet Take 50 mcg by mouth daily before breakfast.    [provider]  metFORMIN (GLUCOPHAGE) 1000 MG tablet Take 1,000 mg by mouth daily with breakfast.  Patient not taking: No sig reported    [provider]  pantoprazole (PROTONIX) 40 MG tablet Take 40 mg by mouth 2 (two) times daily. Reported on 04/17/2016    [provider]  rosuvastatin (CRESTOR) 40 MG tablet Take 1 tablet (40 mg total) by mouth daily. Patient not taking: Reported on 04/20/2022 10/13/20   Donzetta Starch, NP  traZODone (DESYREL) 50 MG tablet Take 50 mg by mouth at bedtime as needed for sleep.     [provider]  vitamin B-12 (CYANOCOBALAMIN) 500 MCG tablet Take 500 mcg by mouth every evening.      [provider]    Review of Systems:  Review of Systems  Constitutional: Negative.   HENT: Negative.    Eyes:  Positive for blurred vision.  Respiratory: Negative.    Cardiovascular: Negative.   Gastrointestinal: Negative.   Genitourinary: Negative.   Musculoskeletal: Negative.   Skin: Negative.   Neurological:  Positive for weakness.  Endo/Heme/Allergies: Negative.   Psychiatric/Behavioral: Negative.      Physical Exam:  Vitals:   05/28/22 1314 05/28/22 1500 05/28/22 1515 05/28/22 1550  BP: (!) 164/69 113/87  132/62  Pulse: 68  60 (!) 55  Resp: (!) $RemoveB'22 15 18 12  'BIQRCgLQ$ Temp:    98.2 F (36.8 C)  TempSrc:    Oral  SpO2: 100% 100%  97%  Weight:    54.4 kg  Height:    '5\' 2"'$  (1.575 m)   Physical Exam Constitutional:      Comments: Very anxious appearing elderly woman sitting in bed asking to constantly go home and complaining of not being able to see  HENT:     Head: Normocephalic and atraumatic.     Mouth/Throat:     Mouth: Mucous membranes are moist.  Eyes:     Pupils: Pupils are equal, round, and reactive to light.     Comments: Unable to follow finger or move eyes to commands  Cardiovascular:     Rate and Rhythm: Normal rate and regular rhythm.  Pulmonary:     Effort: Pulmonary effort is normal. No respiratory distress.     Breath sounds: Normal breath sounds.  Abdominal:     General: Bowel sounds are normal. There is no distension.     Palpations: Abdomen is soft.     Tenderness: There is no abdominal tenderness.  Musculoskeletal:        General: No swelling. Normal range of motion.     Cervical back: Normal range of motion and neck supple.  Skin:    General: Skin is warm and dry.  Neurological:     Comments: Barely can see fingers in front of both eyes directly; dysmetria vs  tremor in RUE>LUE; 4/5 strength in extremities about equally; paresthesia in LLE and RUE but not consistent (and unable to even distinguish paresthesia in face but patient too  anxious)   Psychiatric:     Comments: Anxious mood      Labs on Admission:  I have personally reviewed following labs and imaging studies Results for orders placed or performed during the hospital encounter of 05/28/22 (from the past 24 hour(s))  CBC with Differential     Status: Abnormal   Collection Time: 05/28/22  1:23 PM  Result Value Ref Range   WBC 8.5 4.0 - 10.5 K/uL   RBC 3.72 (L) 3.87 - 5.11 MIL/uL   Hemoglobin 10.6 (L) 12.0 - 15.0 g/dL   HCT 33.2 (L) 36.0 - 46.0 %   MCV 89.2 80.0 - 100.0 fL   MCH 28.5 26.0 - 34.0 pg   MCHC 31.9 30.0 - 36.0 g/dL   RDW 15.6 (H) 11.5 - 15.5 %   Platelets 345 150 - 400 K/uL   nRBC 0.0 0.0 - 0.2 %   Neutrophils Relative % 65 %   Neutro Abs 5.5 1.7 - 7.7 K/uL   Lymphocytes Relative 29 %   Lymphs Abs 2.5 0.7 - 4.0 K/uL   Monocytes Relative 5 %   Monocytes Absolute 0.4 0.1 - 1.0 K/uL   Eosinophils Relative 1 %   Eosinophils Absolute 0.0 0.0 - 0.5 K/uL   Basophils Relative 0 %   Basophils Absolute 0.0 0.0 - 0.1 K/uL   Immature Granulocytes 0 %   Abs Immature Granulocytes 0.03 0.00 - 0.07 K/uL  Comprehensive metabolic panel     Status: Abnormal   Collection Time: 05/28/22  1:23 PM  Result Value Ref Range   Sodium 139 135 - 145 mmol/L   Potassium 4.5 3.5 - 5.1 mmol/L   Chloride 109 98 - 111 mmol/L   CO2 20 (L) 22 - 32 mmol/L   Glucose, Bld 189 (H) 70 - 99 mg/dL   BUN 27 (H) 8 - 23 mg/dL   Creatinine, Ser 2.20 (H) 0.44 - 1.00 mg/dL   Calcium 9.9 8.9 - 10.3 mg/dL   Total Protein 7.8 6.5 - 8.1 g/dL   Albumin 3.7 3.5 - 5.0 g/dL   AST 23 15 - 41 U/L   ALT 15 0 - 44 U/L   Alkaline Phosphatase 101 38 - 126 U/L   Total Bilirubin 0.5 0.3 - 1.2 mg/dL   GFR, Estimated 23 (L) >60 mL/min   Anion gap 10 5 - 15  Troponin I (High Sensitivity)     Status: None   Collection Time: 05/28/22  1:23 PM  Result Value Ref Range   Troponin I (High Sensitivity) 12 <18 ng/L  Ammonia     Status: None   Collection Time: 05/28/22  1:32 PM  Result Value Ref  Range   Ammonia 26 9 - 35 umol/L  Troponin I (High Sensitivity)     Status: None   Collection Time: 05/28/22  1:32 PM  Result Value Ref Range   Troponin I (High Sensitivity) 11 <18 ng/L  TSH     Status: None   Collection Time: 05/28/22  1:32 PM  Result Value Ref Range   TSH 1.381 0.350 - 4.500 uIU/mL  Lactic acid, plasma     Status: None   Collection Time: 05/28/22  2:50 PM  Result Value Ref Range   Lactic Acid, Venous 1.1 0.5 - 1.9 mmol/L  Urinalysis, Routine w reflex microscopic Urine, Clean  Catch     Status: Abnormal   Collection Time: 05/28/22  2:55 PM  Result Value Ref Range   Color, Urine YELLOW (A) YELLOW   APPearance CLEAR (A) CLEAR   Specific Gravity, Urine 1.017 1.005 - 1.030   pH 5.0 5.0 - 8.0   Glucose, UA NEGATIVE NEGATIVE mg/dL   Hgb urine dipstick NEGATIVE NEGATIVE   Bilirubin Urine NEGATIVE NEGATIVE   Ketones, ur NEGATIVE NEGATIVE mg/dL   Protein, ur NEGATIVE NEGATIVE mg/dL   Nitrite NEGATIVE NEGATIVE   Leukocytes,Ua NEGATIVE NEGATIVE  Resp Panel by RT-PCR (Flu A&B, Covid) Anterior Nasal Swab     Status: None   Collection Time: 05/28/22  4:08 PM   Specimen: Anterior Nasal Swab  Result Value Ref Range   SARS Coronavirus 2 by RT PCR NEGATIVE NEGATIVE   Influenza A by PCR NEGATIVE NEGATIVE   Influenza B by PCR NEGATIVE NEGATIVE     Radiological Exams on Admission: DG Chest 2 View  Result Date: 05/28/2022 CLINICAL DATA:  Frontal headache. Dizziness for several days. Confusion. EXAM: CHEST - 2 VIEW COMPARISON:  10/24/2021. FINDINGS: Cardiac silhouette is normal in size. No mediastinal or hilar masses or evidence of adenopathy. Prominent interstitial markings, unchanged. No lung consolidation. No evidence of edema. No pleural effusion or pneumothorax. Skeletal structures are intact. IMPRESSION: No active cardiopulmonary disease. Electronically Signed   By: Lajean Manes M.D.   On: 05/28/2022 16:33   CT Head Wo Contrast  Result Date: 05/28/2022 CLINICAL DATA:   Altered mental status, nontraumatic (Ped 0-17y). Pt reports head and "feeling swimmy headed." Per daughter at bedside pt was last normal "about mid week last week EXAM: CT HEAD WITHOUT CONTRAST TECHNIQUE: Contiguous axial images were obtained from the base of the skull through the vertex without intravenous contrast. RADIATION DOSE REDUCTION: This exam was performed according to the departmental dose-optimization program which includes automated exposure control, adjustment of the mA and/or kV according to patient size and/or use of iterative reconstruction technique. COMPARISON:  CT head 10/10/2020 FINDINGS: Brain: Cerebral ventricle sizes are concordant with the degree of cerebral volume loss. Patchy and confluent areas of decreased attenuation are noted throughout the deep and periventricular white matter of the cerebral hemispheres bilaterally, compatible with chronic microvascular ischemic disease. Interval development of chronic appearing right frontal lobe infarction with associated encephalomalacia (3:19). Interval development of age-indeterminate, likely chronic, left occipital lobe infarction. (3:14). Encephalomalacia related to chronic right middle cerebral artery territory infarction. Encephalomalacia related to chronic infarction of the left frontal, parietal, occipital lobes. No new loss of gray-white matter junction differentiation. A no evidence of large-territorial acute infarction. No parenchymal hemorrhage. No mass lesion. No extra-axial collection. No mass effect or midline shift. No hydrocephalus. Basilar cisterns are patent. Vascular: No hyperdense vessel. Skull: No acute fracture or focal lesion. Sinuses/Orbits: Paranasal sinuses and mastoid air cells are clear. The orbits are unremarkable. Other: None. IMPRESSION: 1. Interval development of age-indeterminate, likely chronic, left occipital lobe infarction. Interval development of chronic appearing right frontal lobe infarction. Consider MRI  if clinically indicated. 2. No acute intra cranial hemorrhage. Electronically Signed   By: Iven Finn M.D.   On: 05/28/2022 15:38   DG Chest 2 View  Final Result    CT Head Wo Contrast  Final Result    MR BRAIN WO CONTRAST    (Results Pending)  CT ANGIO HEAD NECK W WO CM    (Results Pending)    Consults called:    EKG: Independently reviewed. Motion degraded but  appears NSR   Dwyane Dee, MD [facility] 05/28/2022, 6:03 PM

## 2022-05-28 NOTE — Assessment & Plan Note (Addendum)
Baseline CKD3b with creatinine typically ~1.5-1.7.  Presented with creatinine 2.2.  Likely prerenal azotemia which improved with IV fluids. Renal function now at baseline. --Stop IV fluids --Monitor BMP --Encourage p.o. hydration

## 2022-05-28 NOTE — ED Provider Triage Note (Signed)
Emergency Medicine Provider Triage Evaluation Note  Erin Yates , a 76 y.o. female  was evaluated in triage.  Pt complains of headache frontal area when pointing. Dizziness x several days and worse today long with agitation and confusion. Last known normal mental status was mid week per daughter.  History CVA.   Review of Systems  Positive: + headache.  Negative: No N/V  Physical Exam  There were no vitals taken for this visit. Gen:   Awake, no distress  confused.  Not oriented to patient's name, date of birth, place. Patient talking to family.  Resp:  Normal effort  MSK:   Moves extremities, upper and lower. Other:    Medical Decision Making  Medically screening exam initiated at 1:13 PM.  Appropriate orders placed.  Erin Yates was informed that the remainder of the evaluation will be completed by another provider, this initial triage assessment does not replace that evaluation, and the importance of remaining in the ED until their evaluation is complete.     Johnn Hai, PA-C 05/28/22 1327

## 2022-05-28 NOTE — Assessment & Plan Note (Addendum)
Due to acute left PCA infarct, management as outlined therein.  Work-up has been completed inpatient. -Patient was evaluated by neurology.   Recommended to continue on aspirin and Plavix until 05/31/2022 then resume Eliquis 2.5 mg twice daily - Outpatient follow-up with neurology  - Lipitor increased to 80 mg daily

## 2022-05-28 NOTE — Assessment & Plan Note (Signed)
Per admitting hospitalist: "Patient adamant and very anxious on wanting to smoke and wishing to go home just to do so.  Husband states she smokes about 1 carton a week." --Nicotine patch ordered --Patient has expressed no interest or desire in trying to quit and given her poor functional status at baseline, likely little benefit with cessation at this time however tried to encourage family to consider her current code status in context of this

## 2022-05-28 NOTE — Assessment & Plan Note (Signed)
Last hospitalized November 2021 for acute right MCA stroke requiring emergent embolectomy.  Has chronic residual peripheral vision loss.  Has "tunnel vision" at baseline since prior strokes.

## 2022-05-28 NOTE — Assessment & Plan Note (Signed)
Likely anemia of chronic disease in the setting of CKD 3B.  Hemoglobin stable with baseline around 10-11.

## 2022-05-28 NOTE — Assessment & Plan Note (Addendum)
Hbg A1c 7%. Covered with sliding scale NovoLog coverage, often held due to not meeting parameters. Will not continue insulin at discharge to rehab. Primary Care follow up.

## 2022-05-28 NOTE — Assessment & Plan Note (Addendum)
CC ataxia and left PCA infarct

## 2022-05-28 NOTE — Assessment & Plan Note (Addendum)
Appears not on antihypertensives as outpatient.  Monitor BP.

## 2022-05-28 NOTE — Assessment & Plan Note (Addendum)
Follows with cardiology, Dr. [name]; last office visit noted 07/14/2021.  History of bradycardia with trial of beta-blockers in the past which were discontinued. Also taken off of amiodarone due to nausea. Continued on Eliquis which was dose reduced for renal function --Continue Eliquis -- Telemetry

## 2022-05-28 NOTE — Hospital Course (Addendum)
Erin Yates is a 76 yo female with PMH CVA (residual vision deficits, concentration/focus/memory deficits, weakness), PFO (s/p closure with Amplatzer PFO device performed July 2010 by Dr. Aline Brochure, [facility]), DMII, HLD, HTN, hypothyroidism, PAF, depression/anxiety who presents with complaints of ataxia, dizziness, worsening confusion.  She is accompanied by her husband and daughter in the ER.  She has been progressively worsening over the past several days at home.  Her husband has tried giving her some Dramamine for dizziness with no relief.  She does take Dramamine occasionally approximately every couple weeks if feeling dizzy.  Her daughter states that she has appeared to become acutely worse in general but notably due to her confusion and memory impairment. On evaluation in the ER she was very anxious appearing and continuously asking for cigarettes.  She was fixated on wanting to go home in order to be able to smoke her cigarettes. Of note, she gets severe anxiety in the past undergoing MRIs and does not always complete them adequately. 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  She underwent emergent thrombectomy at that time after CTA revealed proximal left M2 occlusion.  She was continued on Eliquis at discharge after hospitalization. She was evaluated by cardiology after discharge for consideration of Watchman device.  She met with Dr. Quentin Ore December 2021 and she was not a candidate for Watchman due to interference of the Amplatzer plug of the interatrial septum. She was recommended to continue on anticoagulation.   She is now admitted for further stroke work-up due to her symptoms as noted. Long discussion held in the ER as well regarding code status with her husband and daughter.  They had a very difficult time thinking about what patient would want, however at this time they are still wishing for  patient to remain full code.  They will continue to discuss status especially as stroke work-up commences.  On evaluating patient, she was complaining of being almost blind.  At baseline she has very poor tunnel vision with no peripheral vision from her prior strokes.  She had a very difficult time even seeing one finger a few inches in front of her eyes while both open.  She essentially could not see a finger when either eye was closed.  Despite this, she has continued to still smoke at home.  Also, as noted she is also described to have a very worsening gait but was not described to be leaning towards any specific direction.  Her husband states she has been holding onto the walls to walk over the past couple days.  He also states that she cannot get her fork to her mouth when trying to eat as well.  Patient is right-handed.

## 2022-05-28 NOTE — ED Provider Notes (Signed)
[facility] Provider Note    Event Date/Time   First MD Initiated Contact with Patient 05/28/22 1504     (approximate)   History   Altered Mental Status   HPI  Erin Yates is a 76 y.o. female with a history of hypertension, diabetes, CKD, atrial fibrillation on Eliquis, hypothyroidism, and stroke who presents with altered mental status for the last couple of days.  The family notes that the patient was saying she felt weak and unwell and was complaining of feeling "swimmy headed" over the last 2 days.  She has been slightly confused.  Today she felt okay when she first woke up but then could not walk on her own or bring food to her mouth while trying to eat.  The patient reports a frontal headache which has been intermittent over the last 2 days.  She had some nausea earlier but this is better.  She denies any vomiting, fever, chest or abdominal pain, cough, or difficulty breathing.  She denies urinary symptoms.     Physical Exam   Triage Vital Signs: ED Triage Vitals [05/28/22 1314]  Enc Vitals Group     BP (!) 164/69     Pulse Rate 68     Resp (!) 22     Temp      Temp src      SpO2 100 %     Weight      Height      Head Circumference      Peak Flow      Pain Score      Pain Loc      Pain Edu?      Excl. in [place]?     Most recent vital signs: Vitals:   05/28/22 1515 05/28/22 1550  BP:  132/62  Pulse: 60 (!) 55  Resp: 18 12  Temp:  98.2 F (36.8 C)  SpO2:  97%     General: Alert, oriented x2, uncomfortable appearing but in no acute distress. CV:  Good peripheral perfusion.  Normal heart sounds. Resp:  Normal effort.  Lungs CTAB. Abd:  Soft and nontender.  No distention.  Other:  EOMI.  PERRLA.  No photophobia.  No facial droop.  Motor intact in all extremities.  No meningeal signs.  Neck supple.  Ataxic on finger-nose especially on the right.  Somewhat dry mucous membranes.   ED Results / Procedures / Treatments   Labs (all  labs ordered are listed, but only abnormal results are displayed) Labs Reviewed  CBC WITH DIFFERENTIAL/PLATELET - Abnormal; Notable for the following components:      Result Value   RBC 3.72 (*)    Hemoglobin 10.6 (*)    HCT 33.2 (*)    RDW 15.6 (*)    All other components within normal limits  COMPREHENSIVE METABOLIC PANEL - Abnormal; Notable for the following components:   CO2 20 (*)    Glucose, Bld 189 (*)    BUN 27 (*)    Creatinine, Ser 2.20 (*)    GFR, Estimated 23 (*)    All other components within normal limits  URINALYSIS, ROUTINE W REFLEX MICROSCOPIC - Abnormal; Notable for the following components:   Color, Urine YELLOW (*)    APPearance CLEAR (*)    All other components within normal limits  RESP PANEL BY RT-PCR (FLU A&B, COVID) [id number]  LACTIC ACID, PLASMA  AMMONIA  TROPONIN I (HIGH SENSITIVITY)  TROPONIN I (HIGH SENSITIVITY)  EKG  ED ECG REPORT I, Arta Silence, the attending physician, personally viewed and interpreted this ECG.  Date: 05/28/2022 EKG Time: 1423 Rate: 59 Rhythm: Unclear rhythm due to poor EKG baseline QRS Axis: normal Intervals: normal ST/T Wave abnormalities: normal Narrative Interpretation: no evidence of acute ischemia; computer read indicates "complete heart block" but I do not see clear P waves    RADIOLOGY  CT head: I independently viewed and interpreted the images; there is no ICH.  Radiology report indicates 2 areas of age-indeterminate CVA that are new since 2021.  PROCEDURES:  Critical Care performed: No  Procedures   MEDICATIONS ORDERED IN ED: Medications  sodium chloride 0.9 % bolus 1,000 mL (1,000 mLs Intravenous New Bag/Given 05/28/22 1555)  acetaminophen (TYLENOL) tablet 650 mg (650 mg Oral Given 05/28/22 1556)     IMPRESSION / MDM / ASSESSMENT AND PLAN / ED COURSE  I reviewed the triage vital signs and the nursing notes.  76 year old female with PMH as noted above presents with generalized weakness,  lightheadedness, altered mental status, and headache over the last couple of days.  I reviewed the past medical records.  The patient was most recently admitted in 2021 and per the hospitalist discharge summary from 10/12/2020 she was treated for right MCA ischemic stroke with clot retrieval.  On exam her vital signs are normal.  Neurologic exam is nonfocal although the patient does have some ataxia.  It is possibly slightly worse on the right.  Differential diagnosis includes, but is not limited to, dehydration/hypovolemia, electrolyte abnormality, AKI, other metabolic disturbance, UTI viral syndrome, or other infection, intracranial hemorrhage, acute CVA, less likely cardiac cause.  I have a low suspicion for meningitis given the lack of meningeal signs on exam and the lack of fever as well as the intermittent nature of the headache.  Patient's presentation is most consistent with acute presentation with potential threat to life or bodily function.  We will obtain lab work-up, CT head, give fluids, and reassess.  The patient is on the cardiac monitor to evaluate for evidence of arrhythmia and/or significant heart rate changes.  ----------------------------------------- 4:49 PM on 05/28/2022 -----------------------------------------  CT shows no acute findings although there are several areas of age-indeterminate stroke.  Lab work-up is significant for mild AKI.  Electrolytes are normal.  Troponin is negative.  Lactate is normal.  Urinalysis shows no findings of UTI.  There is no leukocytosis.  I discussed the case with Dr. Quinn Axe from neurology who recommends an MRI for further evaluation of possible subacute CVA.  I then consulted Dr. Sabino Gasser from the hospitalist service; based on our discussion he agrees to admit the patient.   FINAL CLINICAL IMPRESSION(S) / ED DIAGNOSES   Final diagnoses:  Altered mental status, unspecified altered mental status type  Ataxia     Rx / DC Orders    ED Discharge Orders     None        Note:  This document was prepared using Dragon voice recognition software and may include unintentional dictation errors.    Arta Silence, MD 05/28/22 1650

## 2022-05-28 NOTE — Assessment & Plan Note (Addendum)
-   TSH normal, 1.381 - Continue Synthroid

## 2022-05-29 ENCOUNTER — Encounter: Payer: Self-pay | Admitting: Internal Medicine

## 2022-05-29 ENCOUNTER — Inpatient Hospital Stay: Payer: Medicare HMO

## 2022-05-29 DIAGNOSIS — R55 Syncope and collapse: Secondary | ICD-10-CM | POA: Diagnosis not present

## 2022-05-29 DIAGNOSIS — R29702 NIHSS score 2: Secondary | ICD-10-CM | POA: Diagnosis not present

## 2022-05-29 DIAGNOSIS — R7989 Other specified abnormal findings of blood chemistry: Secondary | ICD-10-CM | POA: Diagnosis present

## 2022-05-29 DIAGNOSIS — E039 Hypothyroidism, unspecified: Secondary | ICD-10-CM | POA: Diagnosis present

## 2022-05-29 DIAGNOSIS — I6621 Occlusion and stenosis of right posterior cerebral artery: Secondary | ICD-10-CM | POA: Diagnosis not present

## 2022-05-29 DIAGNOSIS — N179 Acute kidney failure, unspecified: Secondary | ICD-10-CM | POA: Diagnosis present

## 2022-05-29 DIAGNOSIS — I129 Hypertensive chronic kidney disease with stage 1 through stage 4 chronic kidney disease, or unspecified chronic kidney disease: Secondary | ICD-10-CM | POA: Diagnosis present

## 2022-05-29 DIAGNOSIS — I69311 Memory deficit following cerebral infarction: Secondary | ICD-10-CM | POA: Diagnosis not present

## 2022-05-29 DIAGNOSIS — I6931 Attention and concentration deficit following cerebral infarction: Secondary | ICD-10-CM | POA: Diagnosis not present

## 2022-05-29 DIAGNOSIS — R4182 Altered mental status, unspecified: Secondary | ICD-10-CM | POA: Diagnosis present

## 2022-05-29 DIAGNOSIS — F32A Depression, unspecified: Secondary | ICD-10-CM | POA: Diagnosis present

## 2022-05-29 DIAGNOSIS — I69398 Other sequelae of cerebral infarction: Secondary | ICD-10-CM | POA: Diagnosis not present

## 2022-05-29 DIAGNOSIS — H547 Unspecified visual loss: Secondary | ICD-10-CM | POA: Diagnosis present

## 2022-05-29 DIAGNOSIS — E7800 Pure hypercholesterolemia, unspecified: Secondary | ICD-10-CM | POA: Diagnosis present

## 2022-05-29 DIAGNOSIS — R29704 NIHSS score 4: Secondary | ICD-10-CM | POA: Diagnosis present

## 2022-05-29 DIAGNOSIS — F419 Anxiety disorder, unspecified: Secondary | ICD-10-CM | POA: Diagnosis present

## 2022-05-29 DIAGNOSIS — I63532 Cerebral infarction due to unspecified occlusion or stenosis of left posterior cerebral artery: Secondary | ICD-10-CM | POA: Diagnosis not present

## 2022-05-29 DIAGNOSIS — I639 Cerebral infarction, unspecified: Secondary | ICD-10-CM | POA: Diagnosis not present

## 2022-05-29 DIAGNOSIS — Z8774 Personal history of (corrected) congenital malformations of heart and circulatory system: Secondary | ICD-10-CM | POA: Diagnosis not present

## 2022-05-29 DIAGNOSIS — Z90710 Acquired absence of both cervix and uterus: Secondary | ICD-10-CM | POA: Diagnosis not present

## 2022-05-29 DIAGNOSIS — D631 Anemia in chronic kidney disease: Secondary | ICD-10-CM | POA: Diagnosis present

## 2022-05-29 DIAGNOSIS — R270 Ataxia, unspecified: Secondary | ICD-10-CM | POA: Diagnosis not present

## 2022-05-29 DIAGNOSIS — Z66 Do not resuscitate: Secondary | ICD-10-CM | POA: Diagnosis present

## 2022-05-29 DIAGNOSIS — Z720 Tobacco use: Secondary | ICD-10-CM | POA: Diagnosis not present

## 2022-05-29 DIAGNOSIS — E1122 Type 2 diabetes mellitus with diabetic chronic kidney disease: Secondary | ICD-10-CM | POA: Diagnosis present

## 2022-05-29 DIAGNOSIS — R29705 NIHSS score 5: Secondary | ICD-10-CM | POA: Diagnosis not present

## 2022-05-29 DIAGNOSIS — N1832 Chronic kidney disease, stage 3b: Secondary | ICD-10-CM | POA: Diagnosis present

## 2022-05-29 DIAGNOSIS — Z20822 Contact with and (suspected) exposure to covid-19: Secondary | ICD-10-CM | POA: Diagnosis present

## 2022-05-29 DIAGNOSIS — Z8673 Personal history of transient ischemic attack (TIA), and cerebral infarction without residual deficits: Secondary | ICD-10-CM | POA: Diagnosis not present

## 2022-05-29 DIAGNOSIS — I10 Essential (primary) hypertension: Secondary | ICD-10-CM | POA: Diagnosis not present

## 2022-05-29 DIAGNOSIS — E785 Hyperlipidemia, unspecified: Secondary | ICD-10-CM | POA: Diagnosis not present

## 2022-05-29 DIAGNOSIS — I480 Paroxysmal atrial fibrillation: Secondary | ICD-10-CM | POA: Diagnosis present

## 2022-05-29 LAB — BASIC METABOLIC PANEL
Anion gap: 6 (ref 5–15)
BUN: 21 mg/dL (ref 8–23)
CO2: 23 mmol/L (ref 22–32)
Calcium: 8.9 mg/dL (ref 8.9–10.3)
Chloride: 111 mmol/L (ref 98–111)
Creatinine, Ser: 1.85 mg/dL — ABNORMAL HIGH (ref 0.44–1.00)
GFR, Estimated: 28 mL/min — ABNORMAL LOW (ref >60)
Glucose, Bld: 93 mg/dL (ref 70–99)
Potassium: 4 mmol/L (ref 3.5–5.1)
Sodium: 140 mmol/L (ref 135–145)

## 2022-05-29 LAB — CBC WITH DIFFERENTIAL/PLATELET
Abs Immature Granulocytes: 0.01 10*3/uL (ref 0.00–0.07)
Basophils Absolute: 0 10*3/uL (ref 0.0–0.1)
Basophils Relative: 1 %
Eosinophils Absolute: 0.1 10*3/uL (ref 0.0–0.5)
Eosinophils Relative: 2 %
HCT: 30.1 % — ABNORMAL LOW (ref 36.0–46.0)
Hemoglobin: 9.2 g/dL — ABNORMAL LOW (ref 12.0–15.0)
Immature Granulocytes: 0 %
Lymphocytes Relative: 41 %
Lymphs Abs: 2.3 10*3/uL (ref 0.7–4.0)
MCH: 27.6 pg (ref 26.0–34.0)
MCHC: 30.6 g/dL (ref 30.0–36.0)
MCV: 90.4 fL (ref 80.0–100.0)
Monocytes Absolute: 0.4 10*3/uL (ref 0.1–1.0)
Monocytes Relative: 8 %
Neutro Abs: 2.8 10*3/uL (ref 1.7–7.7)
Neutrophils Relative %: 48 %
Platelets: 292 10*3/uL (ref 150–400)
RBC: 3.33 MIL/uL — ABNORMAL LOW (ref 3.87–5.11)
RDW: 15.6 % — ABNORMAL HIGH (ref 11.5–15.5)
WBC: 5.7 10*3/uL (ref 4.0–10.5)
nRBC: 0 % (ref 0.0–0.2)

## 2022-05-29 LAB — CBG MONITORING, ED
Glucose-Capillary: 111 mg/dL — ABNORMAL HIGH (ref 70–99)
Glucose-Capillary: 152 mg/dL — ABNORMAL HIGH (ref 70–99)

## 2022-05-29 LAB — ECHOCARDIOGRAM COMPLETE
Area-P 1/2: 2.39 cm2
Height: 62 in
S' Lateral: 2.2 cm
Weight: 1920 oz

## 2022-05-29 LAB — MAGNESIUM: Magnesium: 1.9 mg/dL (ref 1.7–2.4)

## 2022-05-29 LAB — GLUCOSE, CAPILLARY
Glucose-Capillary: 161 mg/dL — ABNORMAL HIGH (ref 70–99)
Glucose-Capillary: 187 mg/dL — ABNORMAL HIGH (ref 70–99)

## 2022-05-29 MED ORDER — ONDANSETRON HCL 4 MG/2ML IJ SOLN
4.0000 mg | Freq: Four times a day (QID) | INTRAMUSCULAR | Status: DC | PRN
  Administered 2022-05-29 – 2022-05-30 (×3): 4 mg via INTRAVENOUS
  Filled 2022-05-29 (×3): qty 2

## 2022-05-29 MED ORDER — ATORVASTATIN CALCIUM 20 MG PO TABS
80.0000 mg | ORAL_TABLET | Freq: Every day | ORAL | Status: DC
  Administered 2022-05-29 – 2022-05-31 (×3): 80 mg via ORAL
  Filled 2022-05-29 (×3): qty 4

## 2022-05-29 MED ORDER — APIXABAN 2.5 MG PO TABS: 2.5000 mg | ORAL_TABLET | Freq: Two times a day (BID) | ORAL | 5 refills | Status: DC

## 2022-05-29 MED ORDER — LORAZEPAM 2 MG/ML IJ SOLN: 1.0000 mg | Freq: Once | INTRAMUSCULAR | Status: DC

## 2022-05-29 MED ORDER — ASPIRIN 81 MG PO CHEW: 81.0000 mg | CHEWABLE_TABLET | Freq: Every day | ORAL | 0 refills | Status: DC

## 2022-05-29 MED ORDER — ACETAMINOPHEN 650 MG RE SUPP: 650.0000 mg | RECTAL | Status: DC | PRN

## 2022-05-29 MED ORDER — TRAZODONE HCL 50 MG PO TABS
50.0000 mg | ORAL_TABLET | Freq: Every evening | ORAL | Status: DC | PRN
Start: 2022-05-29 — End: 2022-05-31
  Administered 2022-05-29 – 2022-05-30 (×2): 50 mg via ORAL
  Filled 2022-05-29 (×2): qty 1

## 2022-05-29 MED ORDER — LORAZEPAM 2 MG/ML IJ SOLN
2.0000 mg | Freq: Once | INTRAMUSCULAR | Status: AC
  Administered 2022-05-29: 2 mg via INTRAVENOUS
  Filled 2022-05-29: qty 1

## 2022-05-29 MED ORDER — OXYCODONE HCL 5 MG PO TABS
5.0000 mg | ORAL_TABLET | Freq: Once | ORAL | Status: AC
  Administered 2022-05-29: 5 mg via ORAL
  Filled 2022-05-29: qty 1

## 2022-05-29 MED ORDER — CLOPIDOGREL BISULFATE 75 MG PO TABS: 75.0000 mg | ORAL_TABLET | Freq: Every day | ORAL | 0 refills | Status: DC

## 2022-05-29 MED ORDER — ACETAMINOPHEN 325 MG PO TABS
650.0000 mg | ORAL_TABLET | ORAL | Status: DC | PRN
  Administered 2022-05-29 – 2022-05-30 (×3): 650 mg via ORAL
  Filled 2022-05-29 (×3): qty 2

## 2022-05-29 MED ORDER — VENLAFAXINE HCL ER 75 MG PO CP24
150.0000 mg | ORAL_CAPSULE | Freq: Every day | ORAL | Status: DC
  Administered 2022-05-29 – 2022-05-31 (×3): 150 mg via ORAL
  Filled 2022-05-29 (×2): qty 2
  Filled 2022-05-29: qty 1

## 2022-05-29 MED ORDER — BUPROPION HCL ER (SR) 150 MG PO TB12: 150.0000 mg | ORAL_TABLET | Freq: Two times a day (BID) | ORAL | Status: DC

## 2022-05-29 MED ORDER — BUPROPION HCL ER (SR) 150 MG PO TB12
300.0000 mg | ORAL_TABLET | Freq: Every morning | ORAL | Status: DC
Start: 2022-05-29 — End: 2022-05-31
  Administered 2022-05-29 – 2022-05-31 (×3): 300 mg via ORAL
  Filled 2022-05-29 (×3): qty 2

## 2022-05-29 MED ORDER — APIXABAN 2.5 MG PO TABS
2.5000 mg | ORAL_TABLET | Freq: Two times a day (BID) | ORAL | Status: DC
  Administered 2022-05-31: 2.5 mg via ORAL
  Filled 2022-05-29: qty 1

## 2022-05-29 MED ORDER — ATORVASTATIN CALCIUM 80 MG PO TABS: 80.0000 mg | ORAL_TABLET | Freq: Every day | ORAL | 5 refills | Status: DC

## 2022-05-29 MED ORDER — ACETAMINOPHEN 160 MG/5ML PO SOLN: 650.0000 mg | ORAL | Status: DC | PRN

## 2022-05-29 MED ORDER — BUPROPION HCL ER (SR) 150 MG PO TB12
150.0000 mg | ORAL_TABLET | Freq: Every day | ORAL | Status: DC
Start: 2022-05-29 — End: 2022-05-31
  Administered 2022-05-29 – 2022-05-30 (×2): 150 mg via ORAL
  Filled 2022-05-29 (×2): qty 1

## 2022-05-29 NOTE — Evaluation (Signed)
Occupational Therapy Evaluation Patient Details Name: Erin Yates MRN: [id number] DOB: January 03, 1946 Today's Date: 05/29/2022   History of Present Illness Patient is a 76 year old female with complaints of confusion and worsening balance progressively worsening over the past several days at home. History of CVA with residual vision deficits, concentration/focus/memory deficits, weakness. Ataxia concerning for new CVA vs progressive woresning of underlying extensive encephalomalacia.  MRI of brain with 3.7 x 1.5 cm acute cortical/subcortical infarct within the left occipital lobe.   Clinical Impression   Erin Yates presents with generalized weakness, limited endurance, and impaired balance. Prior to admission, pt has lived in a home with ramped entrance with her husband, has needed limited assistance for completing ADL/IADL, does not use DME although reports using furniture and walls to steady herself when walking. She denies falls. Pt had poor vision at baseline. During today's session, pt's vision is increasingly impaired, with her being unable to see objects a few inches from her face and being unable to identify colors. Her sitting and standing balance are both poor, pt is highly agitated and demonstrates impulsive behavior. She is intermittently able to follow one-step directions. Pt pulls at lines and leads throughout session, is difficult to re-direct. Recommend ongoing OT while hospitalized, anticipate DC to SNF at this point.   Recommendations for follow up therapy are one component of a multi-disciplinary discharge planning process, led by the attending physician.  Recommendations may be updated based on patient status, additional functional criteria and insurance authorization.   Follow Up Recommendations  Skilled nursing-short term rehab (<3 hours/day)    Assistance Recommended at Discharge Frequent or constant Supervision/Assistance  Patient can return home with the following A lot of  help with walking and/or transfers;A lot of help with bathing/dressing/bathroom;Assistance with cooking/housework;Direct supervision/assist for medications management;Assistance with feeding;Help with stairs or ramp for entrance    Functional Status Assessment  Patient has had a recent decline in their functional status and demonstrates the ability to make significant improvements in function in a reasonable and predictable amount of time.  Equipment Recommendations  None recommended by OT    Recommendations for Other Services       Precautions / Restrictions Precautions Precautions: Fall Restrictions Weight Bearing Restrictions: No      Mobility Bed Mobility Overal bed mobility: Needs Assistance Bed Mobility: Supine to Sit, Sit to Supine     Supine to sit: Min assist Sit to supine: Min assist        Transfers Overall transfer level: Needs assistance Equipment used: 2 person hand held assist Transfers: Sit to/from Stand Sit to Stand: Mod assist                  Balance Overall balance assessment: Needs assistance Sitting-balance support: Feet unsupported Sitting balance-Leahy Scale: Poor Sitting balance - Comments: at various times, pt demonstrates anterior lean, R lateral lean, L lateral lean, posterior lean. Requires Mod A for maintaining sitting balance Postural control: Posterior lean, Right lateral lean, Left lateral lean Standing balance support: Bilateral upper extremity supported Standing balance-Leahy Scale: Poor                             ADL either performed or assessed with clinical judgement   ADL Overall ADL's : Needs assistance/impaired  General ADL Comments: Anticipate Mod-Max A for OOB ADLs, 2/2 impaired balance, impaired vision, limited safety awareness, and impulsivity     Vision Ability to See in Adequate Light: 4 Severely impaired Additional Comments: Pt demonstrates  no peripheral vision, very limited central vision. Unable to recognize colors.     Perception     Praxis      Pertinent Vitals/Pain Pain Assessment Pain Assessment: Faces Faces Pain Scale: Hurts a little bit Pain Location: headache Pain Descriptors / Indicators: Aching, Discomfort Pain Intervention(s): Repositioned     Hand Dominance Right   Extremity/Trunk Assessment Upper Extremity Assessment Upper Extremity Assessment: Generalized weakness   Lower Extremity Assessment Lower Extremity Assessment: Generalized weakness       Communication Communication Communication: No difficulties   Cognition Arousal/Alertness: Awake/alert Behavior During Therapy: Agitated, Anxious, Restless, Impulsive Overall Cognitive Status: Impaired/Different from baseline Area of Impairment: Awareness, Following commands, Safety/judgement, Problem solving                       Following Commands: Follows one step commands inconsistently Safety/Judgement: Decreased awareness of safety, Decreased awareness of deficits   Problem Solving: Slow processing General Comments: frequent safety cues required due to impulsive behavior     General Comments  patient has significant vision deficits noted during session. recommend to assess further when patient is more cooperative and less agitated/restless.    Exercises Other Exercises Other Exercises: Educ re: falls prevention, safety, PoC, DC recs   Shoulder Instructions      Home Living Family/patient expects to be discharged to:: Private residence Living Arrangements: Spouse/significant other Available Help at Discharge: Family;Available PRN/intermittently Type of Home: House Home Access: Ramped entrance     Home Layout: Able to live on main level with bedroom/bathroom     Bathroom Shower/Tub: Tub/shower unit   Bathroom Toilet: Standard Bathroom Accessibility: No   Home Equipment: Conservation officer, nature (2 wheels);Cane - single point           Prior Functioning/Environment Prior Level of Function : Needs assist             Mobility Comments: has a cane and RW at home, but reports that she does not use them, instead holds on to furniture/walls when walking. Denies falls history ADLs Comments: Assistance for IADLs, sometimes requires assistance for dressing        OT Problem List: Decreased strength;Decreased activity tolerance;Decreased coordination;Decreased knowledge of use of DME or AE;Decreased safety awareness;Impaired vision/perception;Decreased cognition      OT Treatment/Interventions: Self-care/ADL training;DME and/or AE instruction;Therapeutic activities;Balance training;Therapeutic exercise;Visual/perceptual remediation/compensation;Patient/family education    OT Goals(Current goals can be found in the care plan section) Acute Rehab OT Goals Patient Stated Goal: to go home, to have a cigarette OT Goal Formulation: With patient Time For Goal Achievement: 06/12/22 Potential to Achieve Goals: Good ADL Goals Pt Will Perform Grooming: with modified independence;sitting;standing Pt Will Perform Lower Body Dressing: with supervision;sitting/lateral leans;with min assist Pt Will Transfer to Toilet: with min assist;ambulating;regular height toilet (using LRAD) Additional ADL Goal #1: Pt will be able to identify/demonstrate 2+ falls prevention techniques  OT Frequency: Min 2X/week    Co-evaluation              AM-PAC OT "6 Clicks" Daily Activity     Outcome Measure Help from another person eating meals?: A Little Help from another person taking care of personal grooming?: A Lot Help from another person toileting, which includes using toliet, bedpan, or urinal?: A Lot Help  from another person bathing (including washing, rinsing, drying)?: A Lot Help from another person to put on and taking off regular upper body clothing?: A Lot Help from another person to put on and taking off regular lower body  clothing?: A Lot 6 Click Score: 13   End of Session    Activity Tolerance: Patient tolerated treatment well;Treatment limited secondary to agitation Patient left: in bed;with nursing/sitter in room;with family/visitor present;with call bell/phone within reach  OT Visit Diagnosis: Unsteadiness on feet (R26.81);Other abnormalities of gait and mobility (R26.89);Muscle weakness (generalized) (M62.81)                Time: 8335-8251 OT Time Calculation (min): 16 min Charges:  OT General Charges $OT Visit: 1 Visit OT Evaluation $OT Eval Moderate Complexity: 1 Mod OT Treatments $Self Care/Home Management : 8-22 mins Josiah Lobo, PhD, MS, OTR/L 05/29/22, 1:49 PM

## 2022-05-29 NOTE — Discharge Instructions (Signed)
Take aspirin and plavix until 7/25. Then on 7/26 stop taking aspirin and plavix and resume taking Eliquis 2.5 mg twice a day

## 2022-05-29 NOTE — ED Notes (Signed)
Pt taken to MRI  

## 2022-05-29 NOTE — Consult Note (Signed)
Neurology Consultation  Reason for Consult: Stroke Referring Physician: Dr. Sabino Gasser, hospitalist  CC: Confusion, dizziness  History is obtained from: Patient, chart, family at bedside  HPI: Erin Yates is a 76 y.o. female past medical history of multiple strokes in the past, 1 of which required right M2 thrombectomy, with residual visual deficits that the family describes as complete loss of peripheral vision bilaterally, memory deficits and generalized weakness with no focal deficits, walks independently, history of PFO status post closure with Amplatzer device in 2010 at [facility], depression, anxiety, tobacco abuse, atrial fibrillation on Eliquis with good compliance noted by family-presenting to the emergency room for evaluation of dizziness and confusion that started sometime Saturday.  Unclear exact time of last known well. She appears extremely anxious and has continued to do so since arrival based on chart review.  She wants to go home right away and wants to smoke a cigarette. Upon trying to get some history, she was doing well up until sometime Saturday when she started feeling dizzy and off-balance.  Husband said that she had been walking without a walker or a cane after her last strokes but started to hold onto the furniture and walls to walk. MRI was done in the emergency room that showed an acute   LKW: 2 days ago IV thrombolysis given?: no, outside the window Premorbid modified Rankin scale (mRS):2   ROS: Full ROS was performed and is negative except as noted in the HPI.   Past Medical History:  Diagnosis Date   Anemia    past  hx   Anxiety    Depression    Diabetes mellitus without complication ([place])    Dysrhythmia    prior to PFO correction   GERD (gastroesophageal reflux disease)    Headache    before PFO closure   Hypercholesteremia    Hypertension    Hypothyroidism    Peripheral vision loss    residual from CVA   PFO (patent foramen ovale)    correction  device placed 2010   Right leg numbness    lower leg- residual from CVA   Seasonal allergies    Stroke [facility])    prior to PFO correction     Family History  Problem Relation Age of Onset   Dementia Father    Breast cancer Sister 43    Social History:   reports that she has been smoking cigarettes. She has a 55.00 pack-year smoking history. She has never used smokeless tobacco. She reports that she does not drink alcohol and does not use drugs.  Medications  Current Facility-Administered Medications:     stroke: early stages of recovery book, , Does not apply, Once, Dwyane Dee, MD   0.9 %  sodium chloride infusion, , Intravenous, Continuous, Dwyane Dee, MD, Last Rate: 75 mL/hr at 05/29/22 0433, Infusion Verify at 05/29/22 0433   acetaminophen (TYLENOL) tablet 650 mg, 650 mg, Oral, Q4H PRN, 650 mg at 05/29/22 0920 **OR** acetaminophen (TYLENOL) 160 MG/5ML solution 650 mg, 650 mg, Per Tube, Q4H PRN **OR** acetaminophen (TYLENOL) suppository 650 mg, 650 mg, Rectal, Q4H PRN, Dwyane Dee, MD   [COMPLETED] aspirin tablet 325 mg, 325 mg, Oral, Once, 325 mg at 05/28/22 2003 **FOLLOWED BY** aspirin chewable tablet 81 mg, 81 mg, Oral, Daily, Derek Jack, MD, 81 mg at 05/29/22 0923   atorvastatin (LIPITOR) tablet 80 mg, 80 mg, Oral, Daily, Dwyane Dee, MD, 80 mg at 05/29/22 8921   buPROPion (WELLBUTRIN SR) 12 hr tablet 150  mg, 150 mg, Oral, QHS, Nazari, Walid A, RPH   buPROPion (WELLBUTRIN SR) 12 hr tablet 300 mg, 300 mg, Oral, q morning, Nazari, Walid A, RPH   clopidogrel (PLAVIX) tablet 75 mg, 75 mg, Oral, Daily, Derek Jack, MD, 75 mg at 05/29/22 2353   heparin injection 5,000 Units, 5,000 Units, Subcutaneous, Q8H, Dwyane Dee, MD, 5,000 Units at 05/29/22 6144   insulin aspart (novoLOG) injection 0-5 Units, 0-5 Units, Subcutaneous, QHS, Girguis, David, MD   insulin aspart (novoLOG) injection 0-6 Units, 0-6 Units, Subcutaneous, TID WC, Girguis, David, MD   nicotine  (NICODERM CQ - dosed in mg/24 hours) patch 21 mg, 21 mg, Transdermal, Daily, Dwyane Dee, MD, 21 mg at 05/29/22 3154   traZODone (DESYREL) tablet 50 mg, 50 mg, Oral, QHS PRN, Dwyane Dee, MD   venlafaxine XR (EFFEXOR-XR) 24 hr capsule 150 mg, 150 mg, Oral, Q breakfast, Dwyane Dee, MD, 150 mg at 05/29/22 0086  Current Outpatient Medications:    ALPRAZolam (XANAX) 0.25 MG tablet, Take 0.25 mg by mouth 2 (two) times daily as needed for anxiety., Disp: , Rfl:    atorvastatin (LIPITOR) 20 MG tablet, Take 20 mg by mouth daily., Disp: , Rfl:    buPROPion (WELLBUTRIN SR) 150 MG 12 hr tablet, Take 1 tablet (150 mg total) by mouth 2 (two) times daily. (Patient taking differently: Take 150-300 mg by mouth 2 (two) times daily. Take 300 mg every morning and 150 every evening), Disp: 60 tablet, Rfl: 5   cholecalciferol (VITAMIN D3) 25 MCG (1000 UT) tablet, Take 1,000 Units by mouth daily., Disp: , Rfl:    desvenlafaxine (PRISTIQ) 100 MG 24 hr tablet, Take 100 mg by mouth daily., Disp: , Rfl:    ferrous sulfate 325 (65 FE) MG tablet, Take 325 mg by mouth every evening., Disp: , Rfl:    vitamin B-12 (CYANOCOBALAMIN) 500 MCG tablet, Take 500 mcg by mouth every evening. , Disp: , Rfl:    albuterol (VENTOLIN HFA) 108 (90 Base) MCG/ACT inhaler, Inhale 2 puffs into the lungs every 6 (six) hours as needed for wheezing or shortness of breath., Disp: 8 g, Rfl: 0   fluticasone (FLONASE) 50 MCG/ACT nasal spray, Place into both nostrils daily as needed for allergies or rhinitis., Disp: , Rfl:    hydrOXYzine (ATARAX) 25 MG tablet, Take 25 mg by mouth 3 (three) times daily as needed., Disp: , Rfl:    pantoprazole (PROTONIX) 40 MG tablet, Take 40 mg by mouth 2 (two) times daily. Reported on 04/17/2016, Disp: , Rfl:    traZODone (DESYREL) 50 MG tablet, Take 50 mg by mouth at bedtime as needed for sleep. , Disp: , Rfl:    Exam: Current vital signs: BP 135/72 (BP Location: Right Arm)   Pulse (!) 58   Temp 97.9 F  (36.6 C) (Oral)   Resp 16   Ht 5\' 2"  (1.575 m)   Wt 54.4 kg   SpO2 100%   BMI 21.95 kg/m  Vital signs in last 24 hours: Temp:  [97.8 F (36.6 C)-98.5 F (36.9 C)] 97.9 F (36.6 C) (07/24 0600) Pulse Rate:  [54-70] 58 (07/24 0924) Resp:  [12-22] 16 (07/24 0924) BP: (110-164)/(51-87) 135/72 (07/24 0924) SpO2:  [94 %-100 %] 100 % (07/24 0924) Weight:  [54.4 kg] 54.4 kg (07/23 1550) General: Awake alert extremely anxious and wanting to go home HEENT: Normocephalic atraumatic CVS: Regular rhythm Abdomen: Nondistended nontender Extremities warm well perfused Neurological exam She is awake alert oriented to self and place.  Unable to tell date or month or age correctly. Able to follow commands Cannot see objects to name them but does not seem to have any difficulty with comprehension or expression. Cranial nerves: Pupils are equal round react light, unhindered extraocular movements, difficult field examination-cannot see anything in both peripheral fields and has an altitudinal defect that is bilaterally where she can only see somewhat in the lower hemifield bilaterally, face is symmetric, facial sensation intact. Motor examination: No drift Sensory examination: Some diminished sensation on the right arm compared to left Coordination difficult to perform but no obvious dysmetria Gait testing deferred NIH stroke scale 1a Level of Conscious.: 0 1b LOC Questions: 2 1c LOC Commands: 0 2 Best Gaze: 0 3 Visual: 2 4 Facial Palsy: 0 5a Motor Arm - left: 0 5b Motor Arm - Right 0 6a Motor Leg - Left: 0 6b Motor Leg - Right: 0 7 Limb Ataxia: 0 8 Sensory: 1 9 Best Language: 0 10 Dysarthria: 0 11 Extinct. and Inatten.: 0 TOTAL: 5   Labs I have reviewed labs in epic and the results pertinent to this consultation are: CBC    Component Value Date/Time   WBC 5.7 05/29/2022 0454   RBC 3.33 (L) 05/29/2022 0454   HGB 9.2 (L) 05/29/2022 0454   HCT 30.1 (L) 05/29/2022 0454   PLT 292  05/29/2022 0454   MCV 90.4 05/29/2022 0454   MCH 27.6 05/29/2022 0454   MCHC 30.6 05/29/2022 0454   RDW 15.6 (H) 05/29/2022 0454   LYMPHSABS 2.3 05/29/2022 0454   MONOABS 0.4 05/29/2022 0454   EOSABS 0.1 05/29/2022 0454   BASOSABS 0.0 05/29/2022 0454    CMP     Component Value Date/Time   NA 140 05/29/2022 0454   K 4.0 05/29/2022 0454   CL 111 05/29/2022 0454   CO2 23 05/29/2022 0454   GLUCOSE 93 05/29/2022 0454   BUN 21 05/29/2022 0454   CREATININE 1.85 (H) 05/29/2022 0454   CALCIUM 8.9 05/29/2022 0454   PROT 7.8 05/28/2022 1323   ALBUMIN 3.7 05/28/2022 1323   AST 23 05/28/2022 1323   ALT 15 05/28/2022 1323   ALKPHOS 101 05/28/2022 1323   BILITOT 0.5 05/28/2022 1323   GFRNONAA 28 (L) 05/29/2022 0454   GFRAA 37 (L) 02/04/2020 1121    Lipid Panel     Component Value Date/Time   CHOL 208 (H) 05/28/2022 1323   TRIG 264 (H) 05/28/2022 1323   HDL 45 05/28/2022 1323   CHOLHDL 4.6 05/28/2022 1323   VLDL 53 (H) 05/28/2022 1323   LDLCALC 110 (H) 05/28/2022 1323   LDLDIRECT 44.0 10/03/2020 0105   LDL 110 A1c 7.0  2D echo EF 60 to 65%, mild LA dilatation, no regional wall motion abnormalities in the left ventricle.  Mild LVH.  Aortic valve normal with sclerosis with no evidence of stenosis.  Mitral valve normal with trivial MR.  Moderate mitral annular calcification.   Imaging I have reviewed the images obtained:  CT-head: Left occipital lobe hypodensity-age-indeterminate-MRI follow-up recommended  MRI examination of the brain: Left occipital lobe acute ischemic stroke pertaining to the left PCA territory.  Significantly motion degraded exam.  Multiple chronic cortical subcortical infarcts within bilateral frontal lobes, bilateral parietal lobes and bilateral occipital lobes along with right occipital lobe as well as right insula.  Chronic right MCA infarct also extends into the portion of the right basal ganglia.  Chronic hemosiderin deposition with some of the bilateral  infarcts.  MRA head -  IMPRESSION:  1. Motion degraded head MRA without evidence of a large vessel occlusion. 2. Severe stenosis versus artifact in the distal right V4 segment. 3. Chronic stenoses of the left A1 and proximal right P2 segments.  Carotid Dopplers-no significant ICA stenosis, antegrade flow in the vertebral arteries bilaterally..  Assessment:  76 year old history of atrial fibrillation Eliquis, tobacco abuse, anxiety, depression, prior strokes amongst other comorbidities presenting for evaluation of worsening ambulation confusion and dizziness with last known well 2 days ago. MR imaging reveals acute left PCA territory infarct. Difficult examination as she is extremely anxious and insistent on going home to smoke. She has near complete blindness on my examination. Her stroke work-up is underway-vessel imaging is pending. Given her prior history of strokes that involves all vascular territories, cardioembolic source is most likely.  Impression: Acute left PCA stroke-cardioembolic versus atheroembolic  Recommendations: Continue frequent rechecks  High intensity statin-goal LDL less than 70.  She is on Eliquis for atrial fibrillation-family reports compliance. There are 2 options-change Eliquis to Pradaxa or continue Eliquis as there is no evidence that changing anticoagulants really changes outcomes-I spoke with the husband and discussed the 2 options-stay with Eliquis or change to [place] would prefer to stay with the Eliquis.  Given the acuity of the stroke-for now continue to hold Eliquis and do aspirin only.  Resume Eliquis on 05/31/2022  Until Eliquis is resumed, dual antiplatelets, which should be stopped upon resumption of Eliquis.  PT OT ST--follow therapy recommendations  Plan discussed with son and husband at bedside.  Plan also discussed with Dr.Girguis, hospitalist.  I also reached out to the outpatient stroke neurologist to discuss the case and have  formed the above recommendations based on my discussion with them since she will be following with them in clinic.  Follow-up [place] neurology stroke clinic in 4 to 6 weeks.  Inpatient neurology will be available as needed.   -- Amie Portland, MD Neurologist [facility] Pager: [phone]

## 2022-05-29 NOTE — TOC Initial Note (Signed)
Transition of Care [facility]) - Initial/Assessment Note    Patient Details  Name: Erin Yates MRN: [id number] Date of Birth: 02/28/1946  Transition of Care [facility]) CM/SW Contact:    Pete Pelt, RN Phone Number: 05/29/2022, 5:09 PM  Clinical Narrative:    Patient from home with family.  SNF recommended for patient.  Initially, patient wanted to discharge home, however after learning of PT recommendations and speaking with family, patient is agreeable to SNF.  RNCM attempted to obtain PASRR, however information did not match state system.  Family will bring in Social Security number and information tomorrow and RNCM will get PASRR from state then.  In the meantime, FL2 sent out for bed search.  RNCM to follow.               Expected Discharge Plan:  (TBD) Barriers to Discharge: Continued Medical Work up   Patient Goals and CMS Choice        Expected Discharge Plan and Services Expected Discharge Plan:  (TBD)       Living arrangements for the past 2 months: Single Family Home Expected Discharge Date: 05/29/22                                    Prior Living Arrangements/Services Living arrangements for the past 2 months: Single Family Home Lives with:: Self, Spouse Patient language and need for interpreter reviewed:: Yes Do you feel safe going back to the place where you live?:  (SNF recommended)      Need for Family Participation in Patient Care: Yes (Comment) Care giver support system in place?: Yes (comment)   Criminal Activity/Legal Involvement Pertinent to Current Situation/Hospitalization: No - Comment as needed  Activities of Daily Living Home Assistive Devices/Equipment: None ADL Screening (condition at time of admission) Patient's cognitive ability adequate to safely complete daily activities?: No Is the patient deaf or have difficulty hearing?: No Does the patient have difficulty seeing, even when wearing glasses/contacts?: Yes Does the patient have  difficulty concentrating, remembering, or making decisions?: Yes Patient able to express need for assistance with ADLs?: Yes Does the patient have difficulty dressing or bathing?: Yes Independently performs ADLs?: No Communication: Independent Dressing (OT): Needs assistance Feeding: Independent Bathing: Needs assistance Toileting: Needs assistance Does the patient have difficulty walking or climbing stairs?: Yes Weakness of Legs: Both Weakness of Arms/Hands: Both  Permission Sought/Granted Permission sought to share information with : Case Manager, Investment banker, corporate granted to share info w AGENCY: Potential SNF        Emotional Assessment Appearance:: Appears stated age Attitude/Demeanor/Rapport: Gracious Affect (typically observed): Pleasant Orientation: : Oriented to Self, Oriented to Place (spoke to family due to patient not fully alert and oriented) Alcohol / Substance Use: Not Applicable Psych Involvement: No (comment)  Admission diagnosis:  Ataxia [R27.0] Acute CVA (cerebrovascular accident) ([place]) [I63.9] Altered mental status, unspecified altered mental status type [R41.82] Patient Active Problem List   Diagnosis Date Noted   Acute CVA (cerebrovascular accident) ([place]) 05/29/2022   Ataxia 05/28/2022   Confusion 05/28/2022   History of embolic stroke 71/24/5809   Agitation 10/12/2020   Acute delirium 10/12/2020   Acute renal failure superimposed on stage 3b chronic kidney disease ([place]) 10/12/2020   AKI (acute kidney injury) ([place]) 10/12/2020   Aortic atherosclerosis ([place]) 10/12/2020   Elevated troponin 10/12/2020   Normocytic anemia 10/12/2020  Acute R MCA ischemic stroke ([place]) s/p clot retrieval, likely embolic d/t AF even though on AC    DMII (diabetes mellitus, type 2) ([place])    Essential hypertension    Atrial fibrillation with RVR ([place])    Dysphagia, post-stroke    Mitral valve disorder    AF (paroxysmal atrial fibrillation)  ([place]) 11/13/2019   CVA (cerebral vascular accident) ([place]) 11/11/2019   Stroke ([place]) 11/10/2019   Type II diabetes mellitus with renal manifestations ([place]) 11/10/2019   Depression    GERD (gastroesophageal reflux disease)    Hypertension    Hypothyroidism    Hyperlipidemia LDL goal <70    Tobacco abuse    Cryptogenic stroke ([place]) 03/27/2016   Stroke (cerebrum) ([place])    CVA (cerebral infarction) 02/07/2016   PCP:  Adin Hector, MD Pharmacy:   [facility] [id number], [address] [place] [address] Phone: [phone] Fax: [phone]  [facility] [address], [address] [address] Phone: [phone] Fax: [phone]     Social Determinants of Health (SDOH) Interventions    Readmission Risk Interventions     No data to display

## 2022-05-29 NOTE — [place] FL2 (Signed)
[place] LEVEL OF CARE SCREENING TOOL     IDENTIFICATION  Patient Name: Erin Yates Birthdate: [birth year] Sex: female Admission Date (Current Location): 05/28/2022  [place] and [place] Number:  Engineering geologist and Address:  [facility], [address] [place], [place] [address]      Provider Number: [id number]  Attending Physician Name and Address:  Dwyane Dee, MD  Relative Name and Phone Number:  Tarry, Blayney (Spouse)   [phone] [facility] Phone)    Current Level of Care: Hospital Recommended Level of Care: [place] Prior Approval Number:    Date Approved/Denied:   PASRR Number:    Discharge Plan: SNF    Current Diagnoses: Patient Active Problem List   Diagnosis Date Noted   Acute CVA (cerebrovascular accident) ([place]) 05/29/2022   Ataxia 05/28/2022   Confusion 05/28/2022   History of embolic stroke 78/46/9629   Agitation 10/12/2020   Acute delirium 10/12/2020   Acute renal failure superimposed on stage 3b chronic kidney disease ([place]) 10/12/2020   AKI (acute kidney injury) ([place]) 10/12/2020   Aortic atherosclerosis ([place]) 10/12/2020   Elevated troponin 10/12/2020   Normocytic anemia 10/12/2020   Acute R MCA ischemic stroke [facility]) s/p clot retrieval, likely embolic d/t AF even though on AC    DMII (diabetes mellitus, type 2) ([place])    Essential hypertension    Atrial fibrillation with RVR (HCC)    Dysphagia, post-stroke    Mitral valve disorder    AF (paroxysmal atrial fibrillation) ([place]) 11/13/2019   CVA (cerebral vascular accident) ([place]) 11/11/2019   Stroke ([place]) 11/10/2019   Type II diabetes mellitus with renal manifestations ([place]) 11/10/2019   Depression    GERD (gastroesophageal reflux disease)    Hypertension    Hypothyroidism    Hyperlipidemia LDL goal <70    Tobacco abuse    Cryptogenic stroke ([place]) 03/27/2016   Stroke (cerebrum) (HCC)    CVA (cerebral infarction) 02/07/2016     Orientation RESPIRATION BLADDER Height & Weight     Self  Normal Continent (needs toileting) Weight: 54.4 kg Height:  5\' 2"  (157.5 cm)  BEHAVIORAL SYMPTOMS/MOOD NEUROLOGICAL BOWEL NUTRITION STATUS      Continent Diet (Regular)  AMBULATORY STATUS COMMUNICATION OF NEEDS Skin    (patient did not ambulate on assessment) Verbally Normal                       Personal Care Assistance Level of Assistance  Bathing, Feeding, Dressing Bathing Assistance: Maximum assistance (moderate max assist)   Dressing Assistance: Maximum assistance (moderate max assistance)     Functional Limitations Info  Sight, Hearing, Speech Sight Info: Impaired Hearing Info: Adequate Speech Info: Adequate    SPECIAL CARE FACTORS FREQUENCY  PT (By licensed PT), OT (By licensed OT)     PT Frequency: Min 5x weekly OT Frequency: min 5x weekly            Contractures Contractures Info: Not present    Additional Factors Info  Code Status, Allergies Code Status Info: DNR Allergies Info: Alendronate Not Specified  Other (See Comments) Rash  Penicillins           Current Medications (05/29/2022):  This is the current hospital active medication list Current Facility-Administered Medications  Medication Dose Route Frequency Provider Last Rate Last Admin    stroke: early stages of recovery book   Does not apply Once Dwyane Dee, MD       0.9 %  sodium chloride infusion   Intravenous Continuous Dwyane Dee, MD 75 mL/hr at 05/29/22 1303 New Bag at 05/29/22 1303   acetaminophen (TYLENOL) tablet 650 mg  650 mg Oral Q4H PRN Dwyane Dee, MD   650 mg at 05/29/22 1556   Or   acetaminophen (TYLENOL) 160 MG/5ML solution 650 mg  650 mg Per Tube Q4H PRN Dwyane Dee, MD       Or   acetaminophen (TYLENOL) suppository 650 mg  650 mg Rectal Q4H PRN Dwyane Dee, MD       Derrill Memo ON 05/31/2022] apixaban (ELIQUIS) tablet 2.5 mg  2.5 mg Oral BID Dwyane Dee, MD       aspirin chewable tablet 81 mg  81  mg Oral Daily Dwyane Dee, MD   81 mg at 05/29/22 3710   atorvastatin (LIPITOR) tablet 80 mg  80 mg Oral Daily Dwyane Dee, MD   80 mg at 05/29/22 6269   buPROPion (WELLBUTRIN SR) 12 hr tablet 150 mg  150 mg Oral QHS Rito Ehrlich A, RPH       buPROPion (WELLBUTRIN SR) 12 hr tablet 300 mg  300 mg Oral q morning Rito Ehrlich A, RPH   300 mg at 05/29/22 1025   clopidogrel (PLAVIX) tablet 75 mg  75 mg Oral Daily Dwyane Dee, MD   75 mg at 05/29/22 4854   insulin aspart (novoLOG) injection 0-5 Units  0-5 Units Subcutaneous QHS Dwyane Dee, MD       insulin aspart (novoLOG) injection 0-6 Units  0-6 Units Subcutaneous TID WC Dwyane Dee, MD   1 Units at 05/29/22 1303   nicotine (NICODERM CQ - dosed in mg/24 hours) patch 21 mg  21 mg Transdermal Daily Dwyane Dee, MD   21 mg at 05/29/22 0924   ondansetron (ZOFRAN) injection 4 mg  4 mg Intravenous Q6H PRN Dwyane Dee, MD   4 mg at 05/29/22 1556   traZODone (DESYREL) tablet 50 mg  50 mg Oral QHS PRN Dwyane Dee, MD       venlafaxine XR (EFFEXOR-XR) 24 hr capsule 150 mg  150 mg Oral Q breakfast Dwyane Dee, MD   150 mg at 05/29/22 6270     Discharge Medications: Please see discharge summary for a list of discharge medications.  Relevant Imaging Results:  Relevant Lab Results:   Additional Information SSN  Pete Pelt, RN

## 2022-05-29 NOTE — Evaluation (Signed)
Physical Therapy Evaluation Patient Details Name: Erin Yates MRN: [id number] DOB: Jun 12, 1946 Today's Date: 05/29/2022  History of Present Illness  Patient is a 76 year old female with complaints of confusion and worsening balance progressively worsening over the past several days at home. History of CVA with residual vision deficits, concentration/focus/memory deficits, weakness. Ataxia concerning for new CVA vs progressive woresning of underlying extensive encephalomalacia.  MRI of brain with 3.7 x 1.5 cm acute cortical/subcortical infarct within the left occipital lobe.  Clinical Impression  Patient is anxious, restless, with mild agitation during session. Supportive daughter at bedside provided prior level of function information. The patient has had worsening balance and vision issues at home recently.  The patient is constantly moving in the bed and needs frequent safety cues with all mobility. Sitting balance is poor with anterior lean while seated. The patient complains of feeling dizzy while sitting upright without significant change in vitals noted with activity. She has limited sitting tolerance before requesting to return to bed due to fatigue. She will need physical assistance with routine OOB activity when she is able to. The patient also appears to have significant vision impairments which could also be impacting balance. Recommend PT follow up to maximize independence and decrease caregiver burden. SNF is recommended at this time and the daughter is in agreement with this plan.      Recommendations for follow up therapy are one component of a multi-disciplinary discharge planning process, led by the attending physician.  Recommendations may be updated based on patient status, additional functional criteria and insurance authorization.  Follow Up Recommendations Skilled nursing-short term rehab (<3 hours/day) Can patient physically be transported by private vehicle: No     Assistance Recommended at Discharge Frequent or constant Supervision/Assistance  Patient can return home with the following  A lot of help with walking and/or transfers;A lot of help with bathing/dressing/bathroom;Direct supervision/assist for medications management;Direct supervision/assist for financial management;Assist for transportation;Help with stairs or ramp for entrance;Assistance with cooking/housework    Equipment Recommendations None recommended by PT  Recommendations for Other Services       Functional Status Assessment Patient has had a recent decline in their functional status and demonstrates the ability to make significant improvements in function in a reasonable and predictable amount of time.     Precautions / Restrictions Precautions Precautions: Fall Restrictions Weight Bearing Restrictions: No      Mobility  Bed Mobility Overal bed mobility: Needs Assistance Bed Mobility: Supine to Sit, Sit to Supine     Supine to sit: Min guard Sit to supine: Min assist   General bed mobility comments: patient long sitting multiple times during the session without physical assistance due to restlessness. she required assistance for LE support to return to supine from sitting on edge of bed.    Transfers                   General transfer comment: not attempted due limited sitting tolerance, poor sitting balance and patient reports feeling dizziness with request to return to bed.    Ambulation/Gait               General Gait Details: not attempted due to safety concerns  Stairs            Wheelchair Mobility    Modified Rankin (Stroke Patients Only)       Balance Overall balance assessment: Needs assistance Sitting-balance support: Feet unsupported Sitting balance-Leahy Scale: Poor Sitting balance - Comments: Min  A required to maintain upright sitting balance as patient tends to lean too far forward while seated. limited awareness of  deficits and vision impairments may also be contributing to poor balance. patient complains of dizziness while seated. vitals monitored throughout without significant change Postural control:  (anterior lean while seated)                                   Pertinent Vitals/Pain Pain Assessment Pain Assessment: No/denies pain    Home Living Family/patient expects to be discharged to:: Private residence Living Arrangements: Spouse/significant other Available Help at Discharge: Family Type of Home: House Home Access: Ramped entrance       Home Layout: Able to live on main level with bedroom/bathroom Home Equipment: Conservation officer, nature (2 wheels)      Prior Function Prior Level of Function : Independent/Modified Independent             Mobility Comments: unsteady recently, hold on to furniture and walls at times ADLs Comments: intermittent assistance for family     Hand Dominance   Dominant Hand: Right    Extremity/Trunk Assessment   Upper Extremity Assessment Upper Extremity Assessment: Generalized weakness    Lower Extremity Assessment Lower Extremity Assessment: Generalized weakness;Difficult to assess due to impaired cognition (unable to formally MMT due to impaired cogition)       Communication   Communication: No difficulties  Cognition Arousal/Alertness: Awake/alert (drowsy at times) Behavior During Therapy: Anxious, Restless, Agitated, Impulsive Overall Cognitive Status: Impaired/Different from baseline Area of Impairment: Following commands, Safety/judgement, Orientation                 Orientation Level: Disoriented to, Situation, Time     Following Commands: Follows one step commands inconsistently Safety/Judgement: Decreased awareness of safety, Decreased awareness of deficits     General Comments: frequent safety cues required due to impulsive behavior        General Comments General comments (skin integrity, edema, etc.):  patient has significant vision deficits noted during session. recommend to assess further when patient is more cooperative and less agitated/restless.    Exercises     Assessment/Plan    PT Assessment Patient needs continued PT services  PT Problem List Decreased strength;Decreased range of motion;Decreased balance;Decreased activity tolerance;Decreased mobility;Decreased cognition;Decreased knowledge of use of DME;Decreased safety awareness;Decreased knowledge of precautions       PT Treatment Interventions DME instruction;Gait training;Stair training;Functional mobility training;Therapeutic activities;Therapeutic exercise;Balance training;Neuromuscular re-education;Cognitive remediation;Patient/family education    PT Goals (Current goals can be found in the Care Plan section)  Acute Rehab PT Goals Patient Stated Goal: to return home PT Goal Formulation: With patient Time For Goal Achievement: 06/12/22 Potential to Achieve Goals: Fair    Frequency Min 2X/week     Co-evaluation               AM-PAC PT "6 Clicks" Mobility  Outcome Measure Help needed turning from your back to your side while in a flat bed without using bedrails?: None Help needed moving from lying on your back to sitting on the side of a flat bed without using bedrails?: A Little Help needed moving to and from a bed to a chair (including a wheelchair)?: A Little Help needed standing up from a chair using your arms (e.g., wheelchair or bedside chair)?: A Lot Help needed to walk in hospital room?: A Lot Help needed climbing 3-5 steps with a railing? :  Total 6 Click Score: 15    End of Session   Activity Tolerance: Treatment limited secondary to agitation Patient left: in bed;with call bell/phone within reach;with family/visitor present (daughter at bedside)   PT Visit Diagnosis: Unsteadiness on feet (R26.81);Muscle weakness (generalized) (M62.81)    Time: 3887-1959 PT Time Calculation (min) (ACUTE  ONLY): 24 min   Charges:   PT Evaluation $PT Eval Low Complexity: 1 Low PT Treatments $Therapeutic Activity: 8-22 mins        Minna Merritts, PT, MPT   Percell Locus 05/29/2022, 1:01 PM

## 2022-05-29 NOTE — Progress Notes (Signed)
Progress Note    Erin Yates   FVC:[id number]  DOB: [birth year]  DOA: 05/28/2022     1 PCP: Adin Hector, MD  Initial CC: confusion  Hospital Course: Erin Yates is a 76 yo female with PMH CVA (residual vision deficits, concentration/focus/memory deficits, weakness), PFO (s/p closure with Amplatzer PFO device performed July 2010 by Dr. Aline Brochure, [facility]), DMII, HLD, HTN, hypothyroidism, PAF, depression/anxiety who presents with complaints of ataxia, dizziness, worsening confusion.  She is accompanied by her husband and daughter in the ER.  She has been progressively worsening over the past several days at home.  Her husband has tried giving her some Dramamine for dizziness with no relief.  She does take Dramamine occasionally approximately every couple weeks if feeling dizzy.  Her daughter states that she has appeared to become acutely worse in general but notably due to her confusion and memory impairment. On evaluation in the ER she was very anxious appearing and continuously asking for cigarettes.  She was fixated on wanting to go home in order to be able to smoke her cigarettes. Of note, she gets severe anxiety in the past undergoing MRIs and does not always complete them adequately. 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  She underwent emergent thrombectomy at that time after CTA revealed proximal left M2 occlusion.  She was continued on Eliquis at discharge after hospitalization. She was evaluated by cardiology after discharge for consideration of Watchman device.  She met with Dr. Quentin Ore December 2021 and she was not a candidate for Watchman due to interference of the Amplatzer plug of the interatrial septum. She was recommended to continue on anticoagulation.   She is now admitted for further stroke work-up due to her symptoms as noted. Long discussion held in the ER as well  regarding code status with her husband and daughter.  They had a very difficult time thinking about what patient would want, however at this time they are still wishing for patient to remain full code.  They will continue to discuss status especially as stroke work-up commences.  On evaluating patient, she was complaining of being almost blind.  At baseline she has very poor tunnel vision with no peripheral vision from her prior strokes.  She had a very difficult time even seeing one finger a few inches in front of her eyes while both open.  She essentially could not see a finger when either eye was closed.  Despite this, she has continued to still smoke at home.  Also, as noted she is also described to have a very worsening gait but was not described to be leaning towards any specific direction.  Her husband states she has been holding onto the walls to walk over the past couple days.  He also states that she cannot get her fork to her mouth when trying to eat as well.  Patient is right-handed.  Interval History:  No changes overnight.  Son present bedside along with husband this morning.  Patient was still requesting to go home later today but family is wishing for SNF work-up.  Assessment and Plan: * Ataxia - concern for either new CVA vs progressive worsening of underlying extensive encephalomalacia (CT head on admission shows chronic right frontal lobe infarction with encephalomalacia, chronic left occipital lobe infarction with encephalomalacia, left frontal, parietal, occipital encephalomalacia) -MRI brain confirmed 0.7 x 1.5 cm acute cortical/subcortical infarct within the  left occipital lobe -Work-up has been completed inpatient -Patient also evaluated by neurology.  She is recommended to continue on aspirin and Plavix until 05/31/2022 then resume Eliquis 2.5 mg twice daily -Outpatient follow-up with neurology as well - lipitor increased to 80 mg daily  Confusion - See ataxia as well - PT  rec'd SNF but patient wishing to return home to smoke - family wishing for SNF and is talking to patient   Acute renal failure superimposed on stage 3b chronic kidney disease ([place]) - patient has history of CKD3b. Baseline creat ~ 1.5 - 1.7, eGFR 30-35 - patient presents with increase in creat >0.3 mg/dL above baseline, creat increase >1.5x baseline presumed to have occurred within past 7 days PTA -Presumed prerenal due to poor intake recently per husband - Creatinine 2.2 on admission -Treated with fluids after admission - Creatinine improved to 1.85 overnight -Repeat BMP in am   AF (paroxysmal atrial fibrillation) ([place]) - follows with cardiology, Dr. [name]; last office visit noted 07/14/2021 -She had bradycardia with trial of beta-blockers in the past which were discontinued - she was also taken off of amiodarone due to nausea but continued on Eliquis which was dose reduced for renal function -Patient and family endorse compliance with Eliquis  Tobacco abuse - Patient adamant and very anxious on wanting to smoke and wishing to go home just to do so -Husband states she smokes about 1 carton a week -Nicotine patch ordered -Patient has 0 interest or desire in trying to quit and given her poor functional status at baseline, likely little benefit with cessation at this time however tried to encourage family to consider her current code status in context of this  Normocytic anemia - Likely in setting of underlying CKD 3B - Baseline hemoglobin appears to be about 10 to 11 g/dL which she is currently at  History of embolic stroke - Last hospitalized November 2021 for acute right MCA stroke requiring emergent embolectomy  Essential hypertension - Continue home regimen  DMII (diabetes mellitus, type 2) ([place]) - A1c 7%  Hypothyroidism - TSH normal, 1.381 - Continue Synthroid   Old records reviewed in assessment of this patient  Antimicrobials:   DVT prophylaxis:  heparin injection  5,000 Units Start: 05/28/22 2200   Code Status:   Code Status: Full Code  Mobility Assessment (last 72 hours)     Mobility Assessment     Row Name 05/29/22 1346 05/29/22 1200         What is the highest level of mobility based on the progressive mobility assessment? Level 2 (Chairfast) - Balance while sitting on edge of bed and cannot stand Level 2 (Chairfast) - Balance while sitting on edge of bed and cannot stand               Disposition Plan:  SNF possibly Status is: Inpt  Objective: Blood pressure (!) 144/78, pulse 67, temperature 98.3 F (36.8 C), resp. rate 17, height $RemoveBe'5\' 2"'[id number]$  (1.575 m), weight 54.4 kg, SpO2 99 %.  Examination:  Physical Exam Constitutional:      Comments: Very anxious appearing elderly woman sitting in bed asking to constantly go home and complaining of not being able to see  HENT:     Head: Normocephalic and atraumatic.     Mouth/Throat:     Mouth: Mucous membranes are moist.  Eyes:     Pupils: Pupils are equal, round, and reactive to light.     Comments: Unable to follow finger or move eyes  to commands  Cardiovascular:     Rate and Rhythm: Normal rate and regular rhythm.  Pulmonary:     Effort: Pulmonary effort is normal. No respiratory distress.     Breath sounds: Normal breath sounds.  Abdominal:     General: Bowel sounds are normal. There is no distension.     Palpations: Abdomen is soft.     Tenderness: There is no abdominal tenderness.  Musculoskeletal:        General: No swelling. Normal range of motion.     Cervical back: Normal range of motion and neck supple.  Skin:    General: Skin is warm and dry.  Neurological:     Comments: Barely can see fingers in front of both eyes directly; dysmetria vs tremor in RUE>LUE; 4/5 strength in extremities about equally; paresthesia in LLE and RUE but not consistent (and unable to even distinguish paresthesia in face but patient too anxious)   Psychiatric:     Comments: Anxious mood       Consultants:  Neurology  Procedures:    Data Reviewed: Results for orders placed or performed during the hospital encounter of 05/28/22 (from the past 24 hour(s))  Resp Panel by RT-PCR (Flu A&B, Covid) Anterior Nasal Swab     Status: None   Collection Time: 05/28/22  4:08 PM   Specimen: Anterior Nasal Swab  Result Value Ref Range   SARS Coronavirus 2 by RT PCR NEGATIVE NEGATIVE   Influenza A by PCR NEGATIVE NEGATIVE   Influenza B by PCR NEGATIVE NEGATIVE  CBG monitoring, ED     Status: None   Collection Time: 05/28/22 10:19 PM  Result Value Ref Range   Glucose-Capillary 79 70 - 99 mg/dL  Basic metabolic panel     Status: Abnormal   Collection Time: 05/29/22  4:54 AM  Result Value Ref Range   Sodium 140 135 - 145 mmol/L   Potassium 4.0 3.5 - 5.1 mmol/L   Chloride 111 98 - 111 mmol/L   CO2 23 22 - 32 mmol/L   Glucose, Bld 93 70 - 99 mg/dL   BUN 21 8 - 23 mg/dL   Creatinine, Ser 1.85 (H) 0.44 - 1.00 mg/dL   Calcium 8.9 8.9 - 10.3 mg/dL   GFR, Estimated 28 (L) >60 mL/min   Anion gap 6 5 - 15  CBC with Differential/Platelet     Status: Abnormal   Collection Time: 05/29/22  4:54 AM  Result Value Ref Range   WBC 5.7 4.0 - 10.5 K/uL   RBC 3.33 (L) 3.87 - 5.11 MIL/uL   Hemoglobin 9.2 (L) 12.0 - 15.0 g/dL   HCT 30.1 (L) 36.0 - 46.0 %   MCV 90.4 80.0 - 100.0 fL   MCH 27.6 26.0 - 34.0 pg   MCHC 30.6 30.0 - 36.0 g/dL   RDW 15.6 (H) 11.5 - 15.5 %   Platelets 292 150 - 400 K/uL   nRBC 0.0 0.0 - 0.2 %   Neutrophils Relative % 48 %   Neutro Abs 2.8 1.7 - 7.7 K/uL   Lymphocytes Relative 41 %   Lymphs Abs 2.3 0.7 - 4.0 K/uL   Monocytes Relative 8 %   Monocytes Absolute 0.4 0.1 - 1.0 K/uL   Eosinophils Relative 2 %   Eosinophils Absolute 0.1 0.0 - 0.5 K/uL   Basophils Relative 1 %   Basophils Absolute 0.0 0.0 - 0.1 K/uL   Immature Granulocytes 0 %   Abs Immature Granulocytes 0.01 0.00 - 0.07 K/uL  Magnesium     Status: None   Collection Time: 05/29/22  4:54 AM  Result Value  Ref Range   Magnesium 1.9 1.7 - 2.4 mg/dL  CBG monitoring, ED     Status: Abnormal   Collection Time: 05/29/22  8:37 AM  Result Value Ref Range   Glucose-Capillary 111 (H) 70 - 99 mg/dL  CBG monitoring, ED     Status: Abnormal   Collection Time: 05/29/22  1:01 PM  Result Value Ref Range   Glucose-Capillary 152 (H) 70 - 99 mg/dL    I have Reviewed nursing notes, Vitals, and Lab results since pt's last encounter. Pertinent lab results : see above I have ordered test including BMP, CBC, Mg I have reviewed the last note from staff over past 24 hours I have discussed pt's care plan and test results with nursing staff, case manager   LOS: 1 day   Dwyane Dee, MD [facility] 05/29/2022, 3:23 PM

## 2022-05-30 DIAGNOSIS — I63532 Cerebral infarction due to unspecified occlusion or stenosis of left posterior cerebral artery: Secondary | ICD-10-CM | POA: Diagnosis present

## 2022-05-30 DIAGNOSIS — R519 Headache, unspecified: Secondary | ICD-10-CM | POA: Diagnosis present

## 2022-05-30 DIAGNOSIS — R270 Ataxia, unspecified: Secondary | ICD-10-CM | POA: Diagnosis not present

## 2022-05-30 LAB — CBC WITH DIFFERENTIAL/PLATELET
Abs Immature Granulocytes: 0.01 10*3/uL (ref 0.00–0.07)
Basophils Absolute: 0.1 10*3/uL (ref 0.0–0.1)
Basophils Relative: 1 %
Eosinophils Absolute: 0.1 10*3/uL (ref 0.0–0.5)
Eosinophils Relative: 2 %
HCT: 27.6 % — ABNORMAL LOW (ref 36.0–46.0)
Hemoglobin: 8.6 g/dL — ABNORMAL LOW (ref 12.0–15.0)
Immature Granulocytes: 0 %
Lymphocytes Relative: 36 %
Lymphs Abs: 1.8 10*3/uL (ref 0.7–4.0)
MCH: 28 pg (ref 26.0–34.0)
MCHC: 31.2 g/dL (ref 30.0–36.0)
MCV: 89.9 fL (ref 80.0–100.0)
Monocytes Absolute: 0.5 10*3/uL (ref 0.1–1.0)
Monocytes Relative: 10 %
Neutro Abs: 2.6 10*3/uL (ref 1.7–7.7)
Neutrophils Relative %: 51 %
Platelets: 278 10*3/uL (ref 150–400)
RBC: 3.07 MIL/uL — ABNORMAL LOW (ref 3.87–5.11)
RDW: 15.6 % — ABNORMAL HIGH (ref 11.5–15.5)
WBC: 5 10*3/uL (ref 4.0–10.5)
nRBC: 0 % (ref 0.0–0.2)

## 2022-05-30 LAB — BASIC METABOLIC PANEL
Anion gap: 8 (ref 5–15)
BUN: 16 mg/dL (ref 8–23)
CO2: 20 mmol/L — ABNORMAL LOW (ref 22–32)
Calcium: 8.8 mg/dL — ABNORMAL LOW (ref 8.9–10.3)
Chloride: 112 mmol/L — ABNORMAL HIGH (ref 98–111)
Creatinine, Ser: 1.5 mg/dL — ABNORMAL HIGH (ref 0.44–1.00)
GFR, Estimated: 36 mL/min — ABNORMAL LOW (ref >60)
Glucose, Bld: 81 mg/dL (ref 70–99)
Potassium: 3.7 mmol/L (ref 3.5–5.1)
Sodium: 140 mmol/L (ref 135–145)

## 2022-05-30 LAB — GLUCOSE, CAPILLARY
Glucose-Capillary: 112 mg/dL — ABNORMAL HIGH (ref 70–99)
Glucose-Capillary: 132 mg/dL — ABNORMAL HIGH (ref 70–99)
Glucose-Capillary: 110 mg/dL — ABNORMAL HIGH (ref 70–99)
Glucose-Capillary: 109 mg/dL — ABNORMAL HIGH (ref 70–99)
Glucose-Capillary: 142 mg/dL — ABNORMAL HIGH (ref 70–99)

## 2022-05-30 LAB — MAGNESIUM: Magnesium: 1.8 mg/dL (ref 1.7–2.4)

## 2022-05-30 MED ORDER — BUTALBITAL-APAP-CAFFEINE 50-325-40 MG PO TABS
1.0000 | ORAL_TABLET | Freq: Four times a day (QID) | ORAL | Status: DC | PRN
  Administered 2022-05-30 (×2): 1 via ORAL
  Filled 2022-05-30 (×2): qty 1

## 2022-05-30 MED ORDER — METHOCARBAMOL 500 MG PO TABS
500.0000 mg | ORAL_TABLET | Freq: Once | ORAL | Status: AC
  Administered 2022-05-30: 500 mg via ORAL
  Filled 2022-05-30: qty 1

## 2022-05-30 MED ORDER — PANTOPRAZOLE SODIUM 40 MG PO TBEC
40.0000 mg | DELAYED_RELEASE_TABLET | Freq: Once | ORAL | Status: AC
Start: 2022-05-30 — End: 2022-05-30
  Administered 2022-05-30: 40 mg via ORAL
  Filled 2022-05-30: qty 1

## 2022-05-30 MED ORDER — NICOTINE 14 MG/24HR TD PT24
14.0000 mg | MEDICATED_PATCH | Freq: Every day | TRANSDERMAL | Status: DC
Start: 2022-05-30 — End: 2022-05-30

## 2022-05-30 MED ORDER — ALUM & MAG HYDROXIDE-SIMETH 200-200-20 MG/5ML PO SUSP
15.0000 mL | ORAL | Status: DC | PRN
Start: 2022-05-30 — End: 2022-05-31
  Administered 2022-05-30 (×2): 15 mL via ORAL
  Filled 2022-05-30 (×2): qty 30

## 2022-05-30 NOTE — Progress Notes (Addendum)
Progress Note   Patient: Erin Yates MCN:[id number] DOB: [birth year] DOA: 05/28/2022     3 DOS: the patient was seen and examined on 05/31/2022   Brief hospital course: HPI on Admission: "Erin Yates is a 76 yo female with PMH CVA (residual vision deficits, concentration/focus/memory deficits, weakness), PFO (s/p closure with Amplatzer PFO device performed July 2010 by Dr. Aline Brochure, [facility]), DMII, HLD, HTN, hypothyroidism, PAF, depression/anxiety who presents with complaints of ataxia, dizziness, worsening confusion.  She is accompanied by her husband and daughter in the ER.  She has been progressively worsening over the past several days at home.  Her husband has tried giving her some Dramamine for dizziness with no relief.  She does take Dramamine occasionally approximately every couple weeks if feeling dizzy.  Her daughter states that she has appeared to become acutely worse in general but notably due to her confusion and memory impairment. On evaluation in the ER she was very anxious appearing and continuously asking for cigarettes.  She was fixated on wanting to go home in order to be able to smoke her cigarettes. Of note, she gets severe anxiety in the past undergoing MRIs and does not always complete them adequately. 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  She underwent emergent thrombectomy at that time after CTA revealed proximal left M2 occlusion.  She was continued on Eliquis at discharge after hospitalization. She was evaluated by cardiology after discharge for consideration of Watchman device.  She met with Dr. Quentin Yates December 2021 and she was not a candidate for Watchman due to interference of the Amplatzer plug of the interatrial septum. She was recommended to continue on anticoagulation.   She is now admitted for further stroke work-up due to her symptoms as noted.  On evaluating  patient, she was complaining of being almost blind.  At baseline she has very poor tunnel vision with no peripheral vision from her prior strokes.  She had a very difficult time even seeing one finger a few inches in front of her eyes while both open.  She essentially could not see a finger when either eye was closed.  Despite this, she has continued to still smoke at home.  Also, as noted she is also described to have a very worsening gait but was not described to be leaning towards any specific direction.  Her husband states she has been holding onto the walls to walk over the past couple days.  He also states that she cannot get her fork to her mouth when trying to eat as well.  Patient is right-handed."  Assessment and Plan: * Acute left PCA stroke ([place]) Presented with ataxia and worsened confusion with near blindness superimposed on her residual peripheral visual deficits from prior strokes.  Work-up this admission revealed an acute left PCA territory infarct. -- Neurology consulted -- Continue DAPT then resume Eliquis 7/26 -- Stop DAPT once Eliquis resumed -- PT OT and SLP recommends SNF for rehab, placement pending -- Continue neurochecks -- Follow-up at [facility] neurology stroke clinic in 4 to 6 weeks -- Continue statin with LDL goal less than 70  Headache Patient reports has been present since time of admission and she also experienced headaches after her prior strokes.  Reporting little relief with Tylenol so far.  Discussed with neurology and Fioricet okay to use. -- Fioricet as needed if headaches not relieved by Tylenol  Confusion CC ataxia and  left PCA infarct   Ataxia Due to acute left PCA infarct, management as outlined therein.  Work-up has been completed inpatient. -Patient was evaluated by neurology.   Recommended to continue on aspirin and Plavix until 05/31/2022 then resume Eliquis 2.5 mg twice daily - Outpatient follow-up with neurology  - Lipitor increased to 80 mg  daily  Acute renal failure superimposed on stage 3b chronic kidney disease ([place]) Baseline CKD3b with creatinine typically ~1.5-1.7.  Presented with creatinine 2.2.  Likely prerenal azotemia which improved with IV fluids. Renal function now at baseline. --Stop IV fluids --Monitor BMP --Encourage p.o. hydration   AF (paroxysmal atrial fibrillation) ([place]) Follows with cardiology, Dr. [name]; last office visit noted 07/14/2021.  History of bradycardia with trial of beta-blockers in the past which were discontinued. Also taken off of amiodarone due to nausea. Continued on Eliquis which was dose reduced for renal function --Continue Eliquis -- Telemetry  Tobacco abuse Per admitting hospitalist: "Patient adamant and very anxious on wanting to smoke and wishing to go home just to do so.  Husband states she smokes about 1 carton a week." --Nicotine patch ordered --Patient has expressed no interest or desire in trying to quit and given her poor functional status at baseline, likely little benefit with cessation at this time however tried to encourage family to consider her current code status in context of this  Normocytic anemia Likely anemia of chronic disease in the setting of CKD 3B.  Hemoglobin stable with baseline around 10-11.  History of embolic stroke Last hospitalized November 2021 for acute right MCA stroke requiring emergent embolectomy.  Has chronic residual peripheral vision loss.  Has "tunnel vision" at baseline since prior strokes.  Essential hypertension Appears not on antihypertensives as outpatient.  Monitor BP.  DMII (diabetes mellitus, type 2) (HCC) Hbg A1c 7%. Continue sliding scale NovoLog coverage  Hypothyroidism - TSH normal, 1.381 - Continue Synthroid        Subjective: Patient seen with son at bedside this morning on rounds.  She reports having a headache but no other acute complaints.  They read both report that her vision has improved slightly since  admission.  Patient denies any new or worsening neurologic symptoms.  No acute events reported.  She remains agreeable to SNF for rehab.  Physical Exam: Vitals:   05/30/22 2001 05/31/22 0015 05/31/22 0436 05/31/22 0755  BP: (!) 162/69 (!) 141/65 114/70 138/67  Pulse: (!) 57 62 68 71  Resp: _0 Temp: 98 F (36.7 C) 98.8 F (37.1 C) 99 F (37.2 C) 98.7 F (37.1 C)  TempSrc:      SpO2: 99% 94% 95% 97%  Weight:      Height:       General exam: awake, alert, no acute distress HEENT: Clear conjunctiva, anicteric sclera moist mucus membranes, hearing grossly normal  Respiratory system: CTAB, no wheezes, rales or rhonchi, normal respiratory effort. Cardiovascular system: normal S1/S2, RRR, 2+ radial pulses, no pedal edema.   Gastrointestinal system: soft, nontender nondistended abdomen Central nervous system: A&O x3. normal speech.  Significant visual deficits -patient was unable to make out my holding up 1 or 2 fingers even very close within 3 to 4 inches of her face, she could make out my white sleeve however.  She could not detect my black stethoscope even within 2 inches of her eyes Extremities: moves all, no edema, normal tone Skin: dry, intact, normal temperature Psychiatry: normal mood, congruent affect, judgement and insight appear normal  Data Reviewed:  Labs: CBGs at goal or better, creatinine improved 1.50, chloride 112, bicarb 20, calcium 8.8, GFR 36, hemoglobin 8.6  Family Communication: Son at bedside on rounds  Disposition: Status is: Inpatient Remains inpatient appropriate because: Requires SNF placement for short-term rehab which is pending   Planned Discharge Destination: Skilled nursing facility    Time spent: 35 minutes  Author: Ezekiel Slocumb, DO 05/31/2022 8:17 AM  For on call review [email or web address].

## 2022-05-30 NOTE — Assessment & Plan Note (Signed)
Patient reports has been present since time of admission and she also experienced headaches after her prior strokes.  Reporting little relief with Tylenol so far.  Discussed with neurology and Fioricet okay to use. -- Fioricet as needed if headaches not relieved by Tylenol

## 2022-05-30 NOTE — Evaluation (Signed)
Occupational Therapy Evaluation Patient Details Name: Erin Yates MRN: [id number] DOB: 06-Jul-1946 Today's Date: 05/30/2022   History of Present Illness Patient is a 76 year old female with complaints of confusion and worsening balance progressively worsening over the past several days at home. History of CVA with residual vision deficits, concentration/focus/memory deficits, weakness. Ataxia concerning for new CVA vs progressive woresning of underlying extensive encephalomalacia.  MRI of brain with 3.7 x 1.5 cm acute cortical/subcortical infarct within the left occipital lobe.   Clinical Impression   Pt. Reports nausea this afternoon, and reports waiting for the nurse to bring her medication to assist with nausea.  Education/caregiver education was provided visual compensatory strategies self feeding, grooming. Pt./caregiver education was provided about placement of items with high contrast background/surfaces. Pt. was provided with a red 1" cylindrical adaptive foam handle for utensils, and oral care devices. With cues, pt. Was able to reach for, and hold the 1" foam adaptive roll in the right hand.  Pt.'s caregiver reports they will try to use it during dinner this evening. Pt. Continues to benefit from OT services for ADL training, A/E training, neuromuscular re-education,  and pt. Education about visual compensatory strategies, home modification, and DME.      Recommendations for follow up therapy are one component of a multi-disciplinary discharge planning process, led by the attending physician.  Recommendations may be updated based on patient status, additional functional criteria and insurance authorization.   Follow Up Recommendations  Skilled nursing-short term rehab (<3 hours/day)    Assistance Recommended at Discharge    Patient can return home with the following A lot of help with walking and/or transfers;A lot of help with bathing/dressing/bathroom;Assistance with  cooking/housework;Direct supervision/assist for medications management;Assistance with feeding;Help with stairs or ramp for entrance    Functional Status Assessment  Patient has had a recent decline in their functional status and demonstrates the ability to make significant improvements in function in a reasonable and predictable amount of time.  Equipment Recommendations  None recommended by OT    Recommendations for Other Services       Precautions / Restrictions Precautions Precautions: Fall Restrictions Weight Bearing Restrictions: No      Mobility Bed Mobility                    Transfers Overall transfer level: Needs assistance   Transfers: Sit to/from Stand Sit to Stand: Min assist           General transfer comment: Mobility per PT report      Balance                                           ADL either performed or assessed with clinical judgement   ADL     Eating/Feeding Details (indicate cue type and reason): Pt./caregiver education about visual compensatory strategies for self- feeding. Pt. was provided with 1" cylindrical foam for built-up handle utensils.                                         Vision Ability to See in Adequate Light: 4 Severely impaired Vision Assessment?: Vision impaired- to be further tested in functional context     Perception     Praxis      Pertinent  Vitals/Pain       Hand Dominance     Extremity/Trunk Assessment Upper Extremity Assessment Upper Extremity Assessment: Generalized weakness           Communication     Cognition Arousal/Alertness: Awake/alert Behavior During Therapy: Flat affect Overall Cognitive Status: Impaired/Different from baseline                         Following Commands: Follows one step commands inconsistently Safety/Judgement: Decreased awareness of safety, Decreased awareness of deficits   Problem Solving: Slow processing        General Comments  discussed discharge recommendation of SNF with the family.    Exercises     Shoulder Instructions      Home Living                                          Prior Functioning/Environment                          OT Problem List: Decreased strength;Decreased activity tolerance;Decreased coordination;Decreased knowledge of use of DME or AE;Decreased safety awareness;Impaired vision/perception;Decreased cognition      OT Treatment/Interventions: Self-care/ADL training;DME and/or AE instruction;Therapeutic activities;Balance training;Therapeutic exercise;Visual/perceptual remediation/compensation;Patient/family education    OT Goals(Current goals can be found in the care plan section) Acute Rehab OT Goals Patient Stated Goal: To feel better OT Goal Formulation: With patient Time For Goal Achievement: 06/12/22 Potential to Achieve Goals: Good  OT Frequency: Min 2X/week    Co-evaluation              AM-PAC OT "6 Clicks" Daily Activity     Outcome Measure Help from another person eating meals?: A Lot Help from another person taking care of personal grooming?: A Lot Help from another person toileting, which includes using toliet, bedpan, or urinal?: A Lot Help from another person bathing (including washing, rinsing, drying)?: A Lot Help from another person to put on and taking off regular upper body clothing?: A Lot Help from another person to put on and taking off regular lower body clothing?: A Lot 6 Click Score: 12   End of Session    Activity Tolerance: Other (comment) (Nausea) Patient left: in bed;with family/visitor present;with call bell/phone within reach  OT Visit Diagnosis: Unsteadiness on feet (R26.81);Other abnormalities of gait and mobility (R26.89);Muscle weakness (generalized) (M62.81)                Time: 3716-9678 &1605-1610 OT Time Calculation (min): 15 min Charges:  OT Treatments $Self Care/Home  Management : 8-22 mins  Harrel Carina, MS, OTR/L   Harrel Carina 05/30/2022, 4:24 PM

## 2022-05-30 NOTE — TOC Progression Note (Signed)
Transition of Care [facility]) - Progression Note    Patient Details  Name: Erin Yates MRN: [id number] Date of Birth: [birth year]  Transition of Care [facility]) CM/SW [place], RN Phone Number: 05/30/2022, 4:00 PM  Clinical Narrative:   Patient and family chose [facility], Tammy aware.  Candace Cruise in process.  Patient is wearing nicotine patch and understands [facility] is non smoking facilty    Expected Discharge Plan:  (TBD) Barriers to Discharge: Continued Medical Work up  Expected Discharge Plan and Services Expected Discharge Plan:  (TBD)       Living arrangements for the past 2 months: Single Family Home Expected Discharge Date: 05/29/22                                     Social Determinants of Health (SDOH) Interventions    Readmission Risk Interventions     No data to display

## 2022-05-30 NOTE — Assessment & Plan Note (Signed)
Presented with ataxia and worsened confusion with near blindness superimposed on her residual peripheral visual deficits from prior strokes.  Work-up this admission revealed an acute left PCA territory infarct. -- Neurology consulted -- Resumed Eliquis - today 7/26 -- Stop DAPT (asprin & Plavix) -- PT OT and SLP recommends SNF for rehab -- Neurochecks -- stable with no worsening or new deficits -- Follow-up at [facility] neurology stroke clinic in 4 to 6 weeks -- Continue high-intensity statin, LDL goal < 70

## 2022-05-30 NOTE — Progress Notes (Signed)
Physical Therapy Treatment Patient Details Name: Erin Yates MRN: [id number] DOB: 08/19/1946 Today's Date: 05/30/2022   History of Present Illness Patient is a 76 year old female with complaints of confusion and worsening balance progressively worsening over the past several days at home. History of CVA with residual vision deficits, concentration/focus/memory deficits, weakness. Ataxia concerning for new CVA vs progressive woresning of underlying extensive encephalomalacia.  MRI of brain with 3.7 x 1.5 cm acute cortical/subcortical infarct within the left occipital lobe.    PT Comments    Patient appears much less anxious than yesterday. She is cooperative throughout and is making progress towards meeting PT goals. Patient was able to ambulate with assistance in the hallway with activity tolerance limited by LE weakness. No dizziness is reported this session. She is mildly impulsive with activity and requires safety cues intermittently. She is not at her baseline level of functional mobility. Recommend SNF placement at discharge for ongoing PT to maximize independence and decrease caregiver burden.    Recommendations for follow up therapy are one component of a multi-disciplinary discharge planning process, led by the attending physician.  Recommendations may be updated based on patient status, additional functional criteria and insurance authorization.  Follow Up Recommendations  Skilled nursing-short term rehab (<3 hours/day) Can patient physically be transported by private vehicle: Yes   Assistance Recommended at Discharge Frequent or constant Supervision/Assistance  Patient can return home with the following A lot of help with walking and/or transfers;A lot of help with bathing/dressing/bathroom;Direct supervision/assist for medications management;Direct supervision/assist for financial management;Assist for transportation;Help with stairs or ramp for entrance;Assistance with  cooking/housework   Equipment Recommendations  None recommended by PT    Recommendations for Other Services       Precautions / Restrictions Precautions Precautions: Fall Restrictions Weight Bearing Restrictions: No     Mobility  Bed Mobility Overal bed mobility: Needs Assistance Bed Mobility: Supine to Sit, Sit to Supine     Supine to sit: Min guard Sit to supine: Min guard   General bed mobility comments: verbal cues for task initiation and sequencing    Transfers Overall transfer level: Needs assistance Equipment used: None Transfers: Sit to/from Stand Sit to Stand: Min assist           General transfer comment: steadying assistance for transfers. verbal cues for technique    Ambulation/Gait Ambulation/Gait assistance: Min assist, Mod assist Gait Distance (Feet): 75 Feet Assistive device: 1 person hand held assist Gait Pattern/deviations: Decreased stride length, Narrow base of support Gait velocity: decreased     General Gait Details: mild trunk sway noted with ambulation. steadying assistance required, especially with turns. patient also requires safety cues for navigation secondary to vision issues as well. she declined ambulating further due to leg weakness   Stairs             Wheelchair Mobility    Modified Rankin (Stroke Patients Only)       Balance Overall balance assessment: Needs assistance Sitting-balance support: Feet supported Sitting balance-Leahy Scale: Fair Sitting balance - Comments: occasional trunk sway noted. close stand by assistance for safety   Standing balance support: Single extremity supported Standing balance-Leahy Scale: Poor Standing balance comment: external support required to maintain standing balance                            Cognition Arousal/Alertness: Awake/alert Behavior During Therapy:  (mildly impulsive improved from yesterday) Overall Cognitive Status: Impaired/Different from  baseline                                 General Comments: patient is able to follow single step commands with extra time. safety cues required for mobility        Exercises      General Comments General comments (skin integrity, edema, etc.): discussed discharge recommendation of SNF with the family.      Pertinent Vitals/Pain Pain Assessment Pain Assessment: No/denies pain    Home Living                          Prior Function            PT Goals (current goals can now be found in the care plan section) Acute Rehab PT Goals Patient Stated Goal: to get to rehab PT Goal Formulation: With patient Time For Goal Achievement: 06/12/22 Potential to Achieve Goals: Fair Progress towards PT goals: Progressing toward goals    Frequency    Min 2X/week      PT Plan Current plan remains appropriate    Co-evaluation              AM-PAC PT "6 Clicks" Mobility   Outcome Measure  Help needed turning from your back to your side while in a flat bed without using bedrails?: None Help needed moving from lying on your back to sitting on the side of a flat bed without using bedrails?: A Little Help needed moving to and from a bed to a chair (including a wheelchair)?: A Little Help needed standing up from a chair using your arms (e.g., wheelchair or bedside chair)?: A Lot Help needed to walk in hospital room?: A Lot Help needed climbing 3-5 steps with a railing? : Total 6 Click Score: 15    End of Session Equipment Utilized During Treatment: Gait belt Activity Tolerance: Patient tolerated treatment well Patient left: in bed;with call bell/phone within reach;with bed alarm set;with family/visitor present Nurse Communication: Mobility status PT Visit Diagnosis: Unsteadiness on feet (R26.81);Muscle weakness (generalized) (M62.81)     Time: 0981-1914 PT Time Calculation (min) (ACUTE ONLY): 18 min  Charges:  $Gait Training: 8-22 mins                     Minna Merritts, PT, MPT    Percell Locus 05/30/2022, 3:04 PM

## 2022-05-30 NOTE — Evaluation (Signed)
Speech Language Pathology Evaluation Patient Details Name: Erin Yates MRN: [id number] DOB: [birth year] Today's Date: 05/30/2022 Time: 5726-2035 SLP Time Calculation (min) (ACUTE ONLY): 12 min  Problem List:  Patient Active Problem List   Diagnosis Date Noted   Acute CVA (cerebrovascular accident) ([place]) 05/29/2022   Ataxia 05/28/2022   Confusion 05/28/2022   History of embolic stroke 59/74/1638   Agitation 10/12/2020   Acute delirium 10/12/2020   Acute renal failure superimposed on stage 3b chronic kidney disease ([place]) 10/12/2020   AKI (acute kidney injury) ([place]) 10/12/2020   Aortic atherosclerosis ([place]) 10/12/2020   Elevated troponin 10/12/2020   Normocytic anemia 10/12/2020   Acute R MCA ischemic stroke (HCC) s/p clot retrieval, likely embolic d/t AF even though on AC    DMII (diabetes mellitus, type 2) ([place])    Essential hypertension    Atrial fibrillation with RVR (HCC)    Dysphagia, post-stroke    Mitral valve disorder    AF (paroxysmal atrial fibrillation) ([place]) 11/13/2019   CVA (cerebral vascular accident) ([place]) 11/11/2019   Stroke ([place]) 11/10/2019   Type II diabetes mellitus with renal manifestations ([place]) 11/10/2019   Depression    GERD (gastroesophageal reflux disease)    Hypertension    Hypothyroidism    Hyperlipidemia LDL goal <70    Tobacco abuse    Cryptogenic stroke ([place]) 03/27/2016   Stroke (cerebrum) (HCC)    CVA (cerebral infarction) 02/07/2016   Past Medical History:  Past Medical History:  Diagnosis Date   Anemia    past  hx   Anxiety    Depression    Diabetes mellitus without complication ([place])    Dysrhythmia    prior to PFO correction   GERD (gastroesophageal reflux disease)    Headache    before PFO closure   Hypercholesteremia    Hypertension    Hypothyroidism    Peripheral vision loss    residual from CVA   PFO (patent foramen ovale)    correction device placed 2010   Right leg numbness    lower leg- residual from CVA   Seasonal  allergies    Stroke [facility])    prior to PFO correction   Past Surgical History:  Past Surgical History:  Procedure Laterality Date   ABDOMINAL HYSTERECTOMY     BUBBLE STUDY  02/06/2020   Procedure: BUBBLE STUDY;  Surgeon: Sanda Klein, MD;  Location: [place];  Service: Cardiovascular;;   CARDIAC CATHETERIZATION     [facility]   CATARACT EXTRACTION W/PHACO Right 09/01/2015   Procedure: CATARACT EXTRACTION PHACO AND INTRAOCULAR LENS PLACEMENT ([place]);  Surgeon: Leandrew Koyanagi, MD;  Location: [place];  Service: Ophthalmology;  Laterality: Right;  DIABETIC - oral meds   CATARACT EXTRACTION W/PHACO Left 10/20/2015   Procedure: CATARACT EXTRACTION PHACO AND INTRAOCULAR LENS PLACEMENT (IOC);  Surgeon: Leandrew Koyanagi, MD;  Location: [place];  Service: Ophthalmology;  Laterality: Left;  DIABETIC - oral meds   EP IMPLANTABLE DEVICE N/A 04/04/2016   Procedure: Loop Recorder Insertion;  Surgeon: Deboraha Sprang, MD;  Location: [place] [facility];  Service: Cardiovascular;  Laterality: N/A;   FOOT NEUROMA SURGERY Left    IR CT HEAD LTD  10/02/2020   IR PERCUTANEOUS ART THROMBECTOMY/INFUSION INTRACRANIAL INC DIAG ANGIO  10/02/2020   IR US GUIDE VASC ACCESS RIGHT  10/02/2020   NASAL SINUS SURGERY     PATENT FORAMEN OVALE CLOSURE     PATENT FORAMEN OVALE CLOSURE     RADIOLOGY WITH ANESTHESIA N/A  10/02/2020   Procedure: IR WITH ANESTHESIA;  Surgeon: Luanne Bras, MD;  Location: [place];  Service: Radiology;  Laterality: N/A;   TEE WITHOUT CARDIOVERSION N/A 02/09/2016   Procedure: TRANSESOPHAGEAL ECHOCARDIOGRAM (TEE);  Surgeon: Adrian Prows, MD;  Location: [place];  Service: Cardiovascular;  Laterality: N/A;   TEE WITHOUT CARDIOVERSION N/A 11/12/2019   Procedure: TRANSESOPHAGEAL ECHOCARDIOGRAM (TEE);  Surgeon: Minna Merritts, MD;  Location: [facility];  Service: Cardiovascular;  Laterality: N/A;   TEE WITHOUT CARDIOVERSION N/A 02/06/2020   Procedure: TRANSESOPHAGEAL  ECHOCARDIOGRAM (TEE);  Surgeon: Sanda Klein, MD;  Location: [facility];  Service: Cardiovascular;  Laterality: N/A;   HPI:  Patient is a 76 year old female with complaints of confusion and worsening balance progressively worsening over the past several days at home. History of CVA with residual vision deficits, concentration/focus/memory deficits, weakness. Ataxia concerning for new CVA vs progressive woresning of underlying extensive encephalomalacia.  MRI of brain with 3.7 x 1.5 cm acute cortical/subcortical infarct within the left occipital lobe.   Assessment / Plan / Recommendation Clinical Impression  Pt presents with mild cognitive communication deficits when compared to baseline per family's report. Pt with increased delayed processing times but abilities appear improved since those reported at admission. At this time, it is difficult to clearly differentiate language vs cognitive deficits. Recommend ST services to evaluate and treat at next venue of care to establish goals appropriate for safe discharge home. Pt and her family were agreeable with this plan.    SLP Assessment  SLP Recommendation/Assessment: All further Speech Lanaguage Pathology  needs can be addressed in the next venue of care SLP Visit Diagnosis: Cognitive communication deficit (R41.841)    Recommendations for follow up therapy are one component of a multi-disciplinary discharge planning process, led by the attending physician.  Recommendations may be updated based on patient status, additional functional criteria and insurance authorization.    Follow Up Recommendations  Skilled nursing-short term rehab (<3 hours/day)    Assistance Recommended at Discharge  Frequent or constant Supervision/Assistance  Functional Status Assessment Patient has had a recent decline in their functional status and/or demonstrates limited ability to make significant improvements in function in a reasonable and predictable amount of time         SLP Evaluation Cognition  Overall Cognitive Status: Impaired/Different from baseline Arousal/Alertness: Awake/alert Orientation Level: Oriented to person;Oriented to place;Disoriented to time;Disoriented to situation Attention: Sustained Sustained Attention: Appears intact       Comprehension  Auditory Comprehension Overall Auditory Comprehension: Impaired Yes/No Questions: Impaired Complex Questions: 50-74% accurate Commands: Impaired Two Step Basic Commands: 50-74% accurate Conversation: Simple EffectiveTechniques: Repetition Visual Recognition/Discrimination Discrimination: Not tested Reading Comprehension Reading Status: Not tested    Expression Expression Primary Mode of Expression: Verbal Verbal Expression Overall Verbal Expression: Impaired Automatic Speech: Name;Social Response Repetition: No impairment Non-Verbal Means of Communication: Not applicable Written Expression Dominant Hand: Right Written Expression: Not tested   Oral / Motor  Oral Motor/Sensory Function Overall Oral Motor/Sensory Function: Within functional limits Motor Speech Overall Motor Speech: Appears within functional limits for tasks assessed           Torianne Laflam B. Rutherford Nail, M.S., CCC-SLP, Mining engineer Certified Brain Injury [place]  [place] Office [phone] Ascom [phone] Fax [phone]

## 2022-05-30 NOTE — Progress Notes (Signed)
OT Cancellation Note  Patient Details Name: Havah Ammon MRN: [id number] DOB: 1946/09/02   Cancelled Treatment:    Reason Eval/Treat Not Completed: Patient at procedure or test/ unavailable (Pt. currently with her HCP. Will reattempt OT treatment at a later time/date.)  Harrel Carina, MS, OTR/L 05/30/2022, 11:21 AM

## 2022-05-30 NOTE — NC FL2 (Signed)
[place] LEVEL OF CARE SCREENING TOOL     IDENTIFICATION  Patient Name: Erin Yates Birthdate: [birth year] Sex: female Admission Date (Current Location): 05/28/2022  [place] and [place] Number:  Engineering geologist and Address:  [facility], [address] [place], [place] [address]      Provider Number: [id number]  Attending Physician Name and Address:  Ezekiel Slocumb, DO  Relative Name and Phone Number:  Nahlia, Hellmann (Spouse)   [phone] [facility] Phone)    Current Level of Care: Hospital Recommended Level of Care: [place] Prior Approval Number:    Date Approved/Denied:   PASRR Number: [id number]  Discharge Plan: SNF    Current Diagnoses: Patient Active Problem List   Diagnosis Date Noted   Acute left PCA stroke ([place]) 05/30/2022   Headache 05/30/2022   Acute CVA (cerebrovascular accident) ([place]) 05/29/2022   Ataxia 05/28/2022   Confusion 05/28/2022   History of embolic stroke 31/49/7026   Agitation 10/12/2020   Acute delirium 10/12/2020   Acute renal failure superimposed on stage 3b chronic kidney disease ([place]) 10/12/2020   AKI (acute kidney injury) ([place]) 10/12/2020   Aortic atherosclerosis ([place]) 10/12/2020   Elevated troponin 10/12/2020   Normocytic anemia 10/12/2020   Acute R MCA ischemic stroke [facility]) s/p clot retrieval, likely embolic d/t AF even though on AC    DMII (diabetes mellitus, type 2) ([place])    Essential hypertension    Atrial fibrillation with RVR (HCC)    Dysphagia, post-stroke    Mitral valve disorder    AF (paroxysmal atrial fibrillation) ([place]) 11/13/2019   CVA (cerebral vascular accident) ([place]) 11/11/2019   Stroke ([place]) 11/10/2019   Type II diabetes mellitus with renal manifestations ([place]) 11/10/2019   Depression    GERD (gastroesophageal reflux disease)    Hypertension    Hypothyroidism    Hyperlipidemia LDL goal <70    Tobacco abuse    Cryptogenic stroke ([place]) 03/27/2016    Stroke (cerebrum) (HCC)    CVA (cerebral infarction) 02/07/2016    Orientation RESPIRATION BLADDER Height & Weight     Self  Normal Continent (needs toileting) Weight: 54.4 kg Height:  5\' 2"  (157.5 cm)  BEHAVIORAL SYMPTOMS/MOOD NEUROLOGICAL BOWEL NUTRITION STATUS      Continent Diet (Regular)  AMBULATORY STATUS COMMUNICATION OF NEEDS Skin    (patient did not ambulate on assessment) Verbally Normal                       Personal Care Assistance Level of Assistance  Bathing, Feeding, Dressing Bathing Assistance: Maximum assistance (moderate max assist)   Dressing Assistance: Maximum assistance (moderate max assistance)     Functional Limitations Info  Sight, Hearing, Speech Sight Info: Impaired Hearing Info: Adequate Speech Info: Adequate    SPECIAL CARE FACTORS FREQUENCY  PT (By licensed PT), OT (By licensed OT)     PT Frequency: Min 5x weekly OT Frequency: min 5x weekly            Contractures Contractures Info: Not present    Additional Factors Info  Code Status, Allergies Code Status Info: DNR Allergies Info: Alendronate Not Specified  Other (See Comments) Rash  Penicillins           Current Medications (05/30/2022):  This is the current hospital active medication list Current Facility-Administered Medications  Medication Dose Route Frequency Provider Last Rate Last Admin    stroke: early stages of recovery book   Does not  apply Once Dwyane Dee, MD       acetaminophen (TYLENOL) tablet 650 mg  650 mg Oral Q4H PRN Dwyane Dee, MD   650 mg at 05/30/22 0165   Or   acetaminophen (TYLENOL) 160 MG/5ML solution 650 mg  650 mg Per Tube Q4H PRN Dwyane Dee, MD       Or   acetaminophen (TYLENOL) suppository 650 mg  650 mg Rectal Q4H PRN Dwyane Dee, MD       alum & mag hydroxide-simeth (MAALOX/MYLANTA) 200-200-20 MG/5ML suspension 15 mL  15 mL Oral Q4H PRN Ezekiel Slocumb, DO       [START ON 05/31/2022] apixaban (ELIQUIS) tablet 2.5 mg  2.5 mg  Oral BID Dwyane Dee, MD       atorvastatin (LIPITOR) tablet 80 mg  80 mg Oral Daily Dwyane Dee, MD   80 mg at 05/30/22 5374   buPROPion (WELLBUTRIN SR) 12 hr tablet 150 mg  150 mg Oral QHS Rito Ehrlich A, RPH   150 mg at 05/29/22 2023   buPROPion (WELLBUTRIN SR) 12 hr tablet 300 mg  300 mg Oral q morning Rito Ehrlich A, RPH   300 mg at 05/30/22 8270   butalbital-acetaminophen-caffeine (FIORICET) 50-325-40 MG per tablet 1 tablet  1 tablet Oral Q6H PRN Nicole Kindred A, DO   1 tablet at 05/30/22 1338   insulin aspart (novoLOG) injection 0-5 Units  0-5 Units Subcutaneous QHS Dwyane Dee, MD       insulin aspart (novoLOG) injection 0-6 Units  0-6 Units Subcutaneous TID WC Dwyane Dee, MD   1 Units at 05/29/22 1801   nicotine (NICODERM CQ - dosed in mg/24 hours) patch 14 mg  14 mg Transdermal Daily Nicole Kindred A, DO       nicotine (NICODERM CQ - dosed in mg/24 hours) patch 21 mg  21 mg Transdermal Daily Dwyane Dee, MD   21 mg at 05/30/22 0852   ondansetron (ZOFRAN) injection 4 mg  4 mg Intravenous Q6H PRN Dwyane Dee, MD   4 mg at 05/30/22 1526   traZODone (DESYREL) tablet 50 mg  50 mg Oral QHS PRN Dwyane Dee, MD   50 mg at 05/29/22 2023   venlafaxine XR (EFFEXOR-XR) 24 hr capsule 150 mg  150 mg Oral Q breakfast Dwyane Dee, MD   150 mg at 05/30/22 7867     Discharge Medications: Please see discharge summary for a list of discharge medications.  Relevant Imaging Results:  Relevant Lab Results:   Additional Information [id number]  Pete Pelt, RN

## 2022-05-31 ENCOUNTER — Encounter: Payer: Self-pay | Admitting: Internal Medicine

## 2022-05-31 DIAGNOSIS — R519 Headache, unspecified: Secondary | ICD-10-CM | POA: Diagnosis not present

## 2022-05-31 DIAGNOSIS — I4891 Unspecified atrial fibrillation: Secondary | ICD-10-CM | POA: Diagnosis not present

## 2022-05-31 DIAGNOSIS — I679 Cerebrovascular disease, unspecified: Secondary | ICD-10-CM | POA: Diagnosis not present

## 2022-05-31 DIAGNOSIS — R270 Ataxia, unspecified: Secondary | ICD-10-CM | POA: Diagnosis not present

## 2022-05-31 DIAGNOSIS — I639 Cerebral infarction, unspecified: Secondary | ICD-10-CM | POA: Diagnosis not present

## 2022-05-31 DIAGNOSIS — I63532 Cerebral infarction due to unspecified occlusion or stenosis of left posterior cerebral artery: Secondary | ICD-10-CM

## 2022-05-31 DIAGNOSIS — D649 Anemia, unspecified: Secondary | ICD-10-CM | POA: Diagnosis not present

## 2022-05-31 DIAGNOSIS — N1832 Chronic kidney disease, stage 3b: Secondary | ICD-10-CM | POA: Diagnosis not present

## 2022-05-31 DIAGNOSIS — M6281 Muscle weakness (generalized): Secondary | ICD-10-CM | POA: Diagnosis not present

## 2022-05-31 DIAGNOSIS — R001 Bradycardia, unspecified: Secondary | ICD-10-CM | POA: Diagnosis not present

## 2022-05-31 DIAGNOSIS — I10 Essential (primary) hypertension: Secondary | ICD-10-CM | POA: Diagnosis not present

## 2022-05-31 DIAGNOSIS — I480 Paroxysmal atrial fibrillation: Secondary | ICD-10-CM | POA: Diagnosis not present

## 2022-05-31 DIAGNOSIS — Z79899 Other long term (current) drug therapy: Secondary | ICD-10-CM | POA: Diagnosis not present

## 2022-05-31 DIAGNOSIS — F17200 Nicotine dependence, unspecified, uncomplicated: Secondary | ICD-10-CM | POA: Diagnosis not present

## 2022-05-31 LAB — CBC WITH DIFFERENTIAL/PLATELET
Abs Immature Granulocytes: 0.02 10*3/uL (ref 0.00–0.07)
Basophils Absolute: 0 10*3/uL (ref 0.0–0.1)
Basophils Relative: 1 %
Eosinophils Absolute: 0.1 10*3/uL (ref 0.0–0.5)
Eosinophils Relative: 2 %
HCT: 28.5 % — ABNORMAL LOW (ref 36.0–46.0)
Hemoglobin: 9 g/dL — ABNORMAL LOW (ref 12.0–15.0)
Immature Granulocytes: 0 %
Lymphocytes Relative: 29 %
Lymphs Abs: 1.8 10*3/uL (ref 0.7–4.0)
MCH: 28.1 pg (ref 26.0–34.0)
MCHC: 31.6 g/dL (ref 30.0–36.0)
MCV: 89.1 fL (ref 80.0–100.0)
Monocytes Absolute: 0.5 10*3/uL (ref 0.1–1.0)
Monocytes Relative: 8 %
Neutro Abs: 3.8 10*3/uL (ref 1.7–7.7)
Neutrophils Relative %: 60 %
Platelets: 264 10*3/uL (ref 150–400)
RBC: 3.2 MIL/uL — ABNORMAL LOW (ref 3.87–5.11)
RDW: 15.4 % (ref 11.5–15.5)
WBC: 6.2 10*3/uL (ref 4.0–10.5)
nRBC: 0 % (ref 0.0–0.2)

## 2022-05-31 LAB — BASIC METABOLIC PANEL
Anion gap: 8 (ref 5–15)
BUN: 15 mg/dL (ref 8–23)
CO2: 22 mmol/L (ref 22–32)
Calcium: 9.2 mg/dL (ref 8.9–10.3)
Chloride: 110 mmol/L (ref 98–111)
Creatinine, Ser: 1.45 mg/dL — ABNORMAL HIGH (ref 0.44–1.00)
GFR, Estimated: 38 mL/min — ABNORMAL LOW (ref >60)
Glucose, Bld: 112 mg/dL — ABNORMAL HIGH (ref 70–99)
Potassium: 3.6 mmol/L (ref 3.5–5.1)
Sodium: 140 mmol/L (ref 135–145)

## 2022-05-31 LAB — GLUCOSE, CAPILLARY: Glucose-Capillary: 115 mg/dL — ABNORMAL HIGH (ref 70–99)

## 2022-05-31 LAB — MAGNESIUM: Magnesium: 2.1 mg/dL (ref 1.7–2.4)

## 2022-05-31 MED ORDER — APIXABAN 2.5 MG PO TABS: 2.5000 mg | ORAL_TABLET | Freq: Two times a day (BID) | ORAL | 5 refills | Status: AC

## 2022-05-31 MED ORDER — BUTALBITAL-APAP-CAFFEINE 50-325-40 MG PO TABS: 1.0000 | ORAL_TABLET | Freq: Four times a day (QID) | ORAL | 0 refills | Status: DC | PRN

## 2022-05-31 MED ORDER — NICOTINE 21 MG/24HR TD PT24: 21.0000 mg | MEDICATED_PATCH | Freq: Every day | TRANSDERMAL | 0 refills | Status: AC

## 2022-05-31 MED ORDER — ATORVASTATIN CALCIUM 80 MG PO TABS: 80.0000 mg | ORAL_TABLET | Freq: Every day | ORAL | 5 refills | Status: DC

## 2022-05-31 MED ORDER — ALUM & MAG HYDROXIDE-SIMETH 200-200-20 MG/5ML PO SUSP: 15.0000 mL | ORAL | 0 refills | Status: DC | PRN

## 2022-05-31 MED ORDER — ALPRAZOLAM 0.25 MG PO TABS: 0.2500 mg | ORAL_TABLET | Freq: Two times a day (BID) | ORAL | 0 refills | Status: AC | PRN

## 2022-05-31 NOTE — Plan of Care (Signed)
Oriented to self, place, situation, L sided & visual deficits from past & current stroke. 1x assist to [facility]. On call contacted overnight re: restless legs, see eMAR for one time orders. PRN's for chronic headache.     Problem: Education: Goal: Ability to describe self-care measures that may prevent or decrease complications (Diabetes Survival Skills Education) will improve Outcome: Progressing Goal: Individualized Educational Video(s) Outcome: Progressing   Problem: Coping: Goal: Ability to adjust to condition or change in health will improve Outcome: Progressing   Problem: Fluid Volume: Goal: Ability to maintain a balanced intake and output will improve Outcome: Progressing   Problem: Health Behavior/Discharge Planning: Goal: Ability to identify and utilize available resources and services will improve Outcome: Progressing Goal: Ability to manage health-related needs will improve Outcome: Progressing   Problem: Metabolic: Goal: Ability to maintain appropriate glucose levels will improve Outcome: Progressing   Problem: Nutritional: Goal: Maintenance of adequate nutrition will improve Outcome: Progressing Goal: Progress toward achieving an optimal weight will improve Outcome: Progressing   Problem: Skin Integrity: Goal: Risk for impaired skin integrity will decrease Outcome: Progressing   Problem: Tissue Perfusion: Goal: Adequacy of tissue perfusion will improve Outcome: Progressing   Problem: Education: Goal: Knowledge of General Education information will improve Description: Including pain rating scale, medication(s)/side effects and non-pharmacologic comfort measures Outcome: Progressing   Problem: Health Behavior/Discharge Planning: Goal: Ability to manage health-related needs will improve Outcome: Progressing   Problem: Clinical Measurements: Goal: Ability to maintain clinical measurements within normal limits will improve Outcome: Progressing Goal: Will  remain free from infection Outcome: Progressing Goal: Diagnostic test results will improve Outcome: Progressing Goal: Respiratory complications will improve Outcome: Progressing Goal: Cardiovascular complication will be avoided Outcome: Progressing   Problem: Activity: Goal: Risk for activity intolerance will decrease Outcome: Progressing   Problem: Nutrition: Goal: Adequate nutrition will be maintained Outcome: Progressing   Problem: Coping: Goal: Level of anxiety will decrease Outcome: Progressing   Problem: Elimination: Goal: Will not experience complications related to bowel motility Outcome: Progressing Goal: Will not experience complications related to urinary retention Outcome: Progressing   Problem: Pain Managment: Goal: General experience of comfort will improve Outcome: Progressing   Problem: Safety: Goal: Ability to remain free from injury will improve Outcome: Progressing   Problem: Skin Integrity: Goal: Risk for impaired skin integrity will decrease Outcome: Progressing   Problem: Education: Goal: Knowledge of disease or condition will improve Outcome: Progressing Goal: Knowledge of secondary prevention will improve (SELECT ALL) Outcome: Progressing Goal: Knowledge of patient specific risk factors will improve (INDIVIDUALIZE FOR PATIENT) Outcome: Progressing   Problem: Coping: Goal: Will identify appropriate support needs Outcome: Progressing   Problem: Health Behavior/Discharge Planning: Goal: Ability to manage health-related needs will improve Outcome: Progressing   Problem: Self-Care: Goal: Ability to participate in self-care as condition permits will improve Outcome: Progressing Goal: Ability to communicate needs accurately will improve Outcome: Progressing   Problem: Nutrition: Goal: Risk of aspiration will decrease Outcome: Progressing   Problem: Ischemic Stroke/TIA Tissue Perfusion: Goal: Complications of ischemic stroke/TIA will  be minimized Outcome: Progressing

## 2022-05-31 NOTE — Discharge Summary (Addendum)
Physician Discharge Summary   Patient: Erin Yates MRN: [id number] DOB: 1945/12/06  Admit date:     05/28/2022  Discharge date: 05/31/2022  Discharge Physician: Ezekiel Slocumb   PCP: Adin Hector, MD   Recommendations at discharge:    Follow up outpatient with Neurology Follow up with Primary Care in 1-2 weeks Repeat CBC, BMP, Mg in 1-2 weeks Continue to encourage and support smoking cessation efforts  Discharge Diagnoses: Principal Problem:   Acute left PCA stroke (HCC) Active Problems:   Ataxia   Headache   AF (paroxysmal atrial fibrillation) (HCC)   Tobacco abuse   Hypothyroidism   DMII (diabetes mellitus, type 2) (HCC)   Essential hypertension   History of embolic stroke   Normocytic anemia  Resolved Problems:   Confusion   Acute renal failure superimposed on stage 3b chronic kidney disease [facility])  Hospital Course: HPI on Admission: "Erin Yates is a 76 yo female with PMH CVA (residual vision deficits, concentration/focus/memory deficits, weakness), PFO (s/p closure with Amplatzer PFO device performed July 2010 by Dr. Aline Brochure, [facility]), DMII, HLD, HTN, hypothyroidism, PAF, depression/anxiety who presents with complaints of ataxia, dizziness, worsening confusion.  She is accompanied by her husband and daughter in the ER.  She has been progressively worsening over the past several days at home.  Her husband has tried giving her some Dramamine for dizziness with no relief.  She does take Dramamine occasionally approximately every couple weeks if feeling dizzy.  Her daughter states that she has appeared to become acutely worse in general but notably due to her confusion and memory impairment. On evaluation in the ER she was very anxious appearing and continuously asking for cigarettes.  She was fixated on wanting to go home in order to be able to smoke her cigarettes. Of note, she gets severe anxiety in the past undergoing MRIs and does not always complete them  adequately. 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  She underwent emergent thrombectomy at that time after CTA revealed proximal left M2 occlusion.  She was continued on Eliquis at discharge after hospitalization. She was evaluated by cardiology after discharge for consideration of Watchman device.  She met with Dr. Quentin Ore December 2021 and she was not a candidate for Watchman due to interference of the Amplatzer plug of the interatrial septum. She was recommended to continue on anticoagulation.   She is now admitted for further stroke work-up due to her symptoms as noted.  On evaluating patient, she was complaining of being almost blind.  At baseline she has very poor tunnel vision with no peripheral vision from her prior strokes.  She had a very difficult time even seeing one finger a few inches in front of her eyes while both open.  She essentially could not see a finger when either eye was closed.  Despite this, she has continued to still smoke at home.  Also, as noted she is also described to have a very worsening gait but was not described to be leaning towards any specific direction.  Her husband states she has been holding onto the walls to walk over the past couple days.  He also states that she cannot get her fork to her mouth when trying to eat as well.  Patient is right-handed."  Assessment and Plan: * Acute left PCA stroke ([place]) Presented with ataxia and worsened confusion with near blindness superimposed on her  residual peripheral visual deficits from prior strokes.  Work-up this admission revealed an acute left PCA territory infarct. -- Neurology consulted -- Resumed Eliquis - today 7/26 -- Stop DAPT (asprin & Plavix) -- PT OT and SLP recommends SNF for rehab -- Neurochecks -- stable with no worsening or new deficits -- Follow-up at [facility] neurology stroke clinic in 4 to 6  weeks -- Continue high-intensity statin, LDL goal < 70  Headache Patient reports has been present since time of admission and she also experienced headaches after her prior strokes.  Reporting little relief with Tylenol so far.  Discussed with neurology and Fioricet okay to use. -- Fioricet as needed if headaches not relieved by Tylenol  Ataxia Due to acute left PCA infarct, management as outlined therein.    Confusion-resolved as of 05/31/2022 CC ataxia and left PCA infarct   Acute renal failure superimposed on stage 3b chronic kidney disease (HCC)-resolved as of 05/31/2022 Baseline CKD3b with creatinine typically ~1.5-1.7.  Presented with creatinine 2.2.  Likely prerenal azotemia which improved with IV fluids. Renal function now at baseline. --Stop IV fluids --Monitor BMP --Encourage p.o. hydration   AF (paroxysmal atrial fibrillation) ([place]) Follows with cardiology, Dr. [name]; last office visit noted 07/14/2021.  History of bradycardia with trial of beta-blockers in the past which were discontinued. Also taken off of amiodarone due to nausea. Continued on Eliquis which was dose reduced for renal function --Continue Eliquis -- Telemetry  Tobacco abuse Per admitting hospitalist: "Patient adamant and very anxious on wanting to smoke and wishing to go home just to do so.  Husband states she smokes about 1 carton a week." --Nicotine patch ordered --Patient has expressed no interest or desire in trying to quit and given her poor functional status at baseline, likely little benefit with cessation at this time however tried to encourage family to consider her current code status in context of this  Normocytic anemia Likely anemia of chronic disease in the setting of CKD 3B.  Hemoglobin stable with baseline around 10-11.  History of embolic stroke Last hospitalized November 2021 for acute right MCA stroke requiring emergent embolectomy.  Has chronic residual peripheral vision loss.  Has  "tunnel vision" at baseline since prior strokes.  Essential hypertension Appears not on antihypertensives as outpatient.  Monitor BP.  DMII (diabetes mellitus, type 2) (HCC) Hbg A1c 7%. Covered with sliding scale NovoLog coverage, often held due to not meeting parameters. Will not continue insulin at discharge to rehab. Primary Care follow up.  Hypothyroidism - TSH normal, 1.381 - Continue Synthroid         Consultants: Neurology Procedures performed: None  Disposition: Skilled nursing facility   Diet recommendation:  Discharge Diet Orders (From admission, onward)     Start     Ordered   05/31/22 0000  Diet - low sodium heart healthy        05/31/22 0945   05/29/22 0000  Diet general        05/29/22 1433           Cardiac and Carb modified diet DISCHARGE MEDICATION: Allergies as of 05/31/2022       Reactions   Alendronate Other (See Comments)   Rash   Penicillins Rash   Also swelling and itching Did it involve swelling of the face/tongue/throat, SOB, or low BP? Yes Did it involve sudden or severe rash/hives, skin peeling, or any reaction on the inside of your mouth or nose? Yes Did you need to seek medical attention at  a hospital or doctor's office? Yes When did it last happen? More than 15 years ago If all above answers are "NO", may proceed with cephalosporin use.        Medication List     TAKE these medications    albuterol 108 (90 Base) MCG/ACT inhaler Commonly known as: VENTOLIN HFA Inhale 2 puffs into the lungs every 6 (six) hours as needed for wheezing or shortness of breath.   ALPRAZolam 0.25 MG tablet Commonly known as: XANAX Take 1 tablet (0.25 mg total) by mouth 2 (two) times daily as needed for anxiety.   alum & mag hydroxide-simeth 200-200-20 MG/5ML suspension Commonly known as: MAALOX/MYLANTA Take 15 mLs by mouth every 4 (four) hours as needed for indigestion or heartburn.   apixaban 2.5 MG Tabs tablet Commonly known as:  ELIQUIS Take 1 tablet (2.5 mg total) by mouth 2 (two) times daily.   atorvastatin 80 MG tablet Commonly known as: LIPITOR Take 1 tablet (80 mg total) by mouth daily. What changed:  medication strength how much to take   buPROPion 150 MG 12 hr tablet Commonly known as: WELLBUTRIN SR Take 1 tablet (150 mg total) by mouth 2 (two) times daily. What changed:  how much to take additional instructions   butalbital-acetaminophen-caffeine 50-325-40 MG tablet Commonly known as: FIORICET Take 1 tablet by mouth every 6 (six) hours as needed for headache.   cholecalciferol 25 MCG (1000 UNIT) tablet Commonly known as: VITAMIN D3 Take 1,000 Units by mouth daily.   desvenlafaxine 100 MG 24 hr tablet Commonly known as: PRISTIQ Take 100 mg by mouth daily.   ferrous sulfate 325 (65 FE) MG tablet Take 325 mg by mouth every evening.   fluticasone 50 MCG/ACT nasal spray Commonly known as: FLONASE Place into both nostrils daily as needed for allergies or rhinitis.   hydrOXYzine 25 MG tablet Commonly known as: ATARAX Take 25 mg by mouth 3 (three) times daily as needed.   nicotine 21 mg/24hr patch Commonly known as: NICODERM CQ - dosed in mg/24 hours Place 1 patch (21 mg total) onto the skin daily.   pantoprazole 40 MG tablet Commonly known as: PROTONIX Take 40 mg by mouth 2 (two) times daily. Reported on 04/17/2016   traZODone 50 MG tablet Commonly known as: DESYREL Take 50 mg by mouth at bedtime as needed for sleep.   vitamin B-12 500 MCG tablet Commonly known as: CYANOCOBALAMIN Take 500 mcg by mouth every evening.        Contact information for follow-up providers     Garvin Fila, MD. Schedule an appointment as soon as possible for a visit in 4 week(s).   Specialties: Neurology, Radiology Contact information: [address] [place] [address] [phone]         Tama High III, MD. Schedule an appointment as soon as possible for a visit in 1  week(s).   Specialty: Internal Medicine Contact information: [place] [address] [phone]         Deboraha Sprang, MD .   Specialty: Cardiology Contact information: [address] [place] [address] [phone]         Deboraha Sprang, MD .   Specialty: Cardiology Contact information: [place] [address] [phone]              Contact information for after-discharge care     Destination  [facility] Preferred SNF .   Service: Skilled Nursing Contact information: [address] [place] [phone]                    Discharge Exam: Danley Danker Weights   05/28/22 1550  Weight: 54.4 kg   General exam: awake, alert, no acute distress HEENT: moist mucus membranes, hearing grossly normal  Respiratory system: CTAB, no wheezes, rales or rhonchi, normal respiratory effort. Cardiovascular system: normal S1/S2, RRR, no pedal edema.   Gastrointestinal system: soft, NT, ND, no HSM felt, +bowel sounds. Central nervous system: A&O x3.  normal speech.  Stable vision deficits pt could only make out my hand moving within ~6 in of face Extremities: moves all, no edema, normal tone Skin: dry, intact, normal temperature Psychiatry: normal mood, congruent affect, judgement and insight appear normal   Condition at discharge: stable  The results of significant diagnostics from this hospitalization (including imaging, microbiology, ancillary and laboratory) are listed below for reference.   Imaging Studies: US Carotid Bilateral  Result Date: 05/29/2022 CLINICAL DATA:  Stroke Hypertension Syncope Hyperlipidemia Diabetes Tobacco use EXAM: BILATERAL CAROTID DUPLEX ULTRASOUND TECHNIQUE: Pearline Cables scale imaging, color Doppler and duplex ultrasound were performed of bilateral carotid and vertebral arteries in the  neck. COMPARISON:  CT angiography neck 10/02/2020 FINDINGS: Criteria: Quantification of carotid stenosis is based on velocity parameters that correlate the residual internal carotid diameter with NASCET-based stenosis levels, using the diameter of the distal internal carotid lumen as the denominator for stenosis measurement. The following velocity measurements were obtained: RIGHT ICA: 65/19 cm/sec CCA: 17/51 cm/sec SYSTOLIC ICA/CCA RATIO:  1.1 ECA: 75 cm/sec LEFT ICA: 51/10 cm/sec CCA: 02/58 cm/sec SYSTOLIC ICA/CCA RATIO:  0.9 ECA: 110 cm/sec RIGHT CAROTID ARTERY: No significant atheromatous plaque. RIGHT VERTEBRAL ARTERY:  Antegrade flow. LEFT CAROTID ARTERY:  No significant atheromatous plaque. LEFT VERTEBRAL ARTERY:  Antegrade flow. IMPRESSION: No significant stenosis of internal carotid arteries. Electronically Signed   By: Miachel Roux M.D.   On: 05/29/2022 12:25   MR ANGIO HEAD WO CONTRAST  Result Date: 05/29/2022 CLINICAL DATA:  Stroke follow-up.  Left PCA infarct on MRI. EXAM: MRA HEAD WITHOUT CONTRAST TECHNIQUE: Angiographic images of the Circle of Willis were acquired using MRA technique without intravenous contrast. COMPARISON:  Head and neck CTA 10/02/2020 FINDINGS: Anterior circulation: The internal carotid arteries are patent from skull base to carotid termini without evidence of a high-grade stenosis. Motion artifact limits assessment of the ACAs and MCAs, particularly of the branch vessels. The M1 segments are grossly patent, as are the right A1 and bilateral proximal A2 segments. The left A1 segment is small and poorly visualized, with a severe proximal stenosis noted on the prior CTA. No aneurysm is identified. Posterior circulation: The intracranial vertebral arteries are patent. Signal loss in the distal right V4 segment may be artifactual or indicative of a severe stenosis (with a tortuous but otherwise normal appearance of the vessel on the prior CTA). The basilar artery is patent without  evidence of a significant stenosis allowing for motion artifact distally. Both PCAs are patent. There is a chronic proximal right P2 stenosis with characterization limited by motion artifact. No aneurysm is identified. Anatomic variants: None. IMPRESSION: 1. Motion degraded head MRA without evidence of a large vessel occlusion. 2. Severe stenosis versus artifact in the distal right V4 segment. 3. Chronic stenoses of the left A1 and proximal right P2 segments. Electronically Signed   By: Seymour Bars.D.  On: 05/29/2022 11:08   ECHOCARDIOGRAM COMPLETE  Result Date: 05/29/2022    ECHOCARDIOGRAM REPORT   Patient Name:   [facility] Date of Exam: 05/28/2022 Medical Rec #:  [id number]      Height:       62.0 in Accession #:    [id number]     Weight:       120.0 lb Date of Birth:  13-Oct-1946      BSA:          1.539 m Patient Age:    80 years       BP:           123/82 mmHg Patient Gender: F              HR:           54 bpm. Exam Location:  [facility] Procedure: 2D Echo, Cardiac Doppler and Color Doppler Indications:     I63.9 Stroke  History:         Patient has prior history of Echocardiogram examinations, most                  recent 10/04/2020. PFO Closure -2010; Risk                  Factors:Hypertension, Diabetes and Dyslipidemia.  Sonographer:     Cresenciano Lick RDCS Referring Phys:  [place] Diagnosing Phys: Kathlyn Sacramento MD IMPRESSIONS  1. Left ventricular ejection fraction, by estimation, is 60 to 65%. The left ventricle has normal function. The left ventricle has no regional wall motion abnormalities. There is mild left ventricular hypertrophy. Left ventricular diastolic parameters are indeterminate.  2. Right ventricular systolic function is normal. The right ventricular size is normal. Tricuspid regurgitation signal is inadequate for assessing PA pressure.  3. Left atrial size was mildly dilated.  4. The mitral valve is normal in structure. Trivial mitral valve regurgitation. No evidence  of mitral stenosis. Moderate mitral annular calcification.  5. The aortic valve is normal in structure. Aortic valve regurgitation is not visualized. Aortic valve sclerosis is present, with no evidence of aortic valve stenosis.  6. The inferior vena cava is normal in size with greater than 50% respiratory variability, suggesting right atrial pressure of 3 mmHg. Conclusion(s)/Recommendation(s): No intracardiac source of embolism detected on this transthoracic study. FINDINGS  Left Ventricle: Left ventricular ejection fraction, by estimation, is 60 to 65%. The left ventricle has normal function. The left ventricle has no regional wall motion abnormalities. The left ventricular internal cavity size was normal in size. There is  mild left ventricular hypertrophy. Left ventricular diastolic parameters are indeterminate. Right Ventricle: The right ventricular size is normal. No increase in right ventricular wall thickness. Right ventricular systolic function is normal. Tricuspid regurgitation signal is inadequate for assessing PA pressure. Left Atrium: Left atrial size was mildly dilated. Right Atrium: Right atrial size was normal in size. Pericardium: There is no evidence of pericardial effusion. Mitral Valve: The mitral valve is normal in structure. Moderate mitral annular calcification. Trivial mitral valve regurgitation. No evidence of mitral valve stenosis. Tricuspid Valve: The tricuspid valve is normal in structure. Tricuspid valve regurgitation is not demonstrated. No evidence of tricuspid stenosis. Aortic Valve: The aortic valve is normal in structure. Aortic valve regurgitation is not visualized. Aortic valve sclerosis is present, with no evidence of aortic valve stenosis. Pulmonic Valve: The pulmonic valve was normal in structure. Pulmonic valve regurgitation is not visualized. No evidence of pulmonic stenosis. Aorta: The  aortic root is normal in size and structure. Venous: The inferior vena cava is normal in  size with greater than 50% respiratory variability, suggesting right atrial pressure of 3 mmHg. IAS/Shunts: No atrial level shunt detected by color flow Doppler.  LEFT VENTRICLE PLAX 2D LVIDd:         3.40 cm   Diastology LVIDs:         2.20 cm   LV e' medial:    4.21 cm/s LV PW:         1.10 cm   LV E/e' medial:  20.0 LV IVS:        1.10 cm   LV e' lateral:   7.89 cm/s LVOT diam:     1.80 cm   LV E/e' lateral: 10.6 LV SV:         66 LV SV Index:   43 LVOT Area:     2.54 cm  RIGHT VENTRICLE             IVC RV Basal diam:  3.10 cm     IVC diam: 1.20 cm RV S prime:     11.25 cm/s TAPSE (M-mode): 1.9 cm LEFT ATRIUM             Index        RIGHT ATRIUM          Index LA diam:        4.50 cm 2.92 cm/m   RA Area:     8.90 cm LA Vol (A2C):   58.0 ml 37.70 ml/m  RA Volume:   18.70 ml 12.15 ml/m LA Vol (A4C):   41.5 ml 26.97 ml/m LA Biplane Vol: 51.4 ml 33.41 ml/m  AORTIC VALVE LVOT Vmax:   93.20 cm/s LVOT Vmean:  73.600 cm/s LVOT VTI:    0.258 m  AORTA Ao Root diam: 3.00 cm Ao Asc diam:  2.70 cm MITRAL VALVE MV Area (PHT): 2.39 cm    SHUNTS MV Decel Time: 317 msec    Systemic VTI:  0.26 m MV E velocity: 84.00 cm/s  Systemic Diam: 1.80 cm MV A velocity: 95.10 cm/s MV E/A ratio:  0.88 Kathlyn Sacramento MD Electronically signed by Kathlyn Sacramento MD Signature Date/Time: 05/29/2022/9:22:16 AM    Final    MR BRAIN WO CONTRAST  Result Date: 05/28/2022 CLINICAL DATA:  Neuro deficit, acute, stroke suspected. EXAM: MRI HEAD WITHOUT CONTRAST TECHNIQUE: Multiplanar, multiecho pulse sequences of the brain and surrounding structures were obtained without intravenous contrast. COMPARISON:  Prior head CT examinations 05/28/2022 and earlier brain MRI 11/11/2019. FINDINGS: Intermittently motion degraded examination. Most notably, there is moderate motion degradation of the sagittal T1 weighted sequence, severe motion degradation of the least motion degraded axial T2 FLAIR sequence, severe motion degradation of the axial T1 weighted  sequence and severe motion degradation of the coronal T2 TSE sequence. Brain: Mild generalized parenchymal atrophy. Acute cortical/subcortical infarct within the medial left occipital lobe, measuring 3.7 x 1.5 cm in transaxial dimensions (left PCA vascular territory). Multiple chronic cortical/subcortical infarcts within the bilateral frontal lobes, bilateral parietal lobes, bilateral occipital lobes and right temporal lobe, as well as right insula. A chronic right MCA territory infarct also extends into portions of the right basal ganglia. Chronic hemosiderin deposition associated with some of the bilateral infarcts. Background moderate multifocal T2 FLAIR hyperintense signal abnormality within the cerebral white matter and pons, nonspecific but compatible with chronic small vessel disease. Chronic lacunar infarcts within the right basal ganglia. Small foci of T2  hyperintensity within the bilateral thalami, which may reflect prominent perivascular spaces or small chronic lacunar infarcts. Small chronic infarcts within the bilateral cerebellar hemispheres. No evidence of an intracranial mass. No extra-axial fluid collection. No midline shift. Vascular: Maintained flow voids within the proximal large arterial vessels. Skull and upper cervical spine: No focal suspicious marrow lesion. Sinuses/Orbits: No mass or acute finding within the imaged orbits. Prior bilateral ocular lens replacement. Postsurgical appearance of the paranasal sinuses. Minimal scattered paranasal sinus mucosal thickening. IMPRESSION: Significantly motion degraded examination. 3.7 x 1.5 cm acute cortical/subcortical infarct within the left occipital lobe (left PCA vascular territory). Background parenchymal atrophy, chronic small vessel ischemic disease and chronic infarcts as detailed. Electronically Signed   By: Kellie Simmering D.O.   On: 05/28/2022 18:33   DG Chest 2 View  Result Date: 05/28/2022 CLINICAL DATA:  Frontal headache. Dizziness for  several days. Confusion. EXAM: CHEST - 2 VIEW COMPARISON:  10/24/2021. FINDINGS: Cardiac silhouette is normal in size. No mediastinal or hilar masses or evidence of adenopathy. Prominent interstitial markings, unchanged. No lung consolidation. No evidence of edema. No pleural effusion or pneumothorax. Skeletal structures are intact. IMPRESSION: No active cardiopulmonary disease. Electronically Signed   By: Lajean Manes M.D.   On: 05/28/2022 16:33   CT Head Wo Contrast  Result Date: 05/28/2022 CLINICAL DATA:  Altered mental status, nontraumatic (Ped 0-17y). Pt reports head and "feeling swimmy headed." Per daughter at bedside pt was last normal "about mid week last week EXAM: CT HEAD WITHOUT CONTRAST TECHNIQUE: Contiguous axial images were obtained from the base of the skull through the vertex without intravenous contrast. RADIATION DOSE REDUCTION: This exam was performed according to the departmental dose-optimization program which includes automated exposure control, adjustment of the mA and/or kV according to patient size and/or use of iterative reconstruction technique. COMPARISON:  CT head 10/10/2020 FINDINGS: Brain: Cerebral ventricle sizes are concordant with the degree of cerebral volume loss. Patchy and confluent areas of decreased attenuation are noted throughout the deep and periventricular white matter of the cerebral hemispheres bilaterally, compatible with chronic microvascular ischemic disease. Interval development of chronic appearing right frontal lobe infarction with associated encephalomalacia (3:19). Interval development of age-indeterminate, likely chronic, left occipital lobe infarction. (3:14). Encephalomalacia related to chronic right middle cerebral artery territory infarction. Encephalomalacia related to chronic infarction of the left frontal, parietal, occipital lobes. No new loss of gray-white matter junction differentiation. A no evidence of large-territorial acute infarction. No  parenchymal hemorrhage. No mass lesion. No extra-axial collection. No mass effect or midline shift. No hydrocephalus. Basilar cisterns are patent. Vascular: No hyperdense vessel. Skull: No acute fracture or focal lesion. Sinuses/Orbits: Paranasal sinuses and mastoid air cells are clear. The orbits are unremarkable. Other: None. IMPRESSION: 1. Interval development of age-indeterminate, likely chronic, left occipital lobe infarction. Interval development of chronic appearing right frontal lobe infarction. Consider MRI if clinically indicated. 2. No acute intra cranial hemorrhage. Electronically Signed   By: Iven Finn M.D.   On: 05/28/2022 15:38    Microbiology: Results for orders placed or performed during the hospital encounter of 05/28/22  Resp Panel by RT-PCR (Flu A&B, Covid) Anterior Nasal Swab     Status: None   Collection Time: 05/28/22  4:08 PM   Specimen: Anterior Nasal Swab  Result Value Ref Range Status   SARS Coronavirus 2 by RT PCR NEGATIVE NEGATIVE Final    Comment: (NOTE) SARS-CoV-2 target nucleic acids are NOT DETECTED.  The SARS-CoV-2 RNA is generally detectable in upper respiratory specimens  during the acute phase of infection. The lowest concentration of SARS-CoV-2 viral copies this assay can detect is 138 copies/mL. A negative result does not preclude SARS-Cov-2 infection and should not be used as the sole basis for treatment or other patient management decisions. A negative result may occur with  improper specimen collection/handling, submission of specimen other than nasopharyngeal swab, presence of viral mutation(s) within the areas targeted by this assay, and inadequate number of viral copies(<138 copies/mL). A negative result must be combined with clinical observations, patient history, and epidemiological information. The expected result is Negative.  Fact Sheet for Patients:  EntrepreneurPulse.com.au  Fact Sheet for Healthcare Providers:   IncredibleEmployment.be  This test is no t yet approved or cleared by the [place] FDA and  has been authorized for detection and/or diagnosis of SARS-CoV-2 by FDA under an Emergency Use Authorization (EUA). This EUA will remain  in effect (meaning this test can be used) for the duration of the COVID-19 declaration under Section 564(b)(1) of the Act, 21 U.S.C.section 360bbb-3(b)(1), unless the authorization is terminated  or revoked sooner.       Influenza A by PCR NEGATIVE NEGATIVE Final   Influenza B by PCR NEGATIVE NEGATIVE Final    Comment: (NOTE) The Xpert Xpress SARS-CoV-2/FLU/RSV plus assay is intended as an aid in the diagnosis of influenza from Nasopharyngeal swab specimens and should not be used as a sole basis for treatment. Nasal washings and aspirates are unacceptable for Xpert Xpress SARS-CoV-2/FLU/RSV testing.  Fact Sheet for Patients: EntrepreneurPulse.com.au  Fact Sheet for Healthcare Providers: IncredibleEmployment.be  This test is not yet approved or cleared by the [place] FDA and has been authorized for detection and/or diagnosis of SARS-CoV-2 by FDA under an Emergency Use Authorization (EUA). This EUA will remain in effect (meaning this test can be used) for the duration of the COVID-19 declaration under Section 564(b)(1) of the Act, 21 U.S.C. section 360bbb-3(b)(1), unless the authorization is terminated or revoked.  Performed at [facility], [address] [place], [place] [address]     Labs: CBC: Recent Labs  Lab 05/28/22 1323 05/29/22 0454 05/30/22 0409 05/31/22 0435  WBC 8.5 5.7 5.0 6.2  NEUTROABS 5.5 2.8 2.6 3.8  HGB 10.6* 9.2* 8.6* 9.0*  HCT 33.2* 30.1* 27.6* 28.5*  MCV 89.2 90.4 89.9 89.1  PLT 345 292 278 226   Basic Metabolic Panel: Recent Labs  Lab 05/28/22 1323 05/29/22 0454 05/30/22 0409 05/31/22 0435  NA 139 140 140 140  K 4.5 4.0 3.7 3.6  CL  109 111 112* 110  CO2 20* 23 20* 22  GLUCOSE 189* 93 81 112*  BUN 27* $Remov'21 16 15  'TidcGK$ CREATININE 2.20* 1.85* 1.50* 1.45*  CALCIUM 9.9 8.9 8.8* 9.2  MG  --  1.9 1.8 2.1   Liver Function Tests: Recent Labs  Lab 05/28/22 1323  AST 23  ALT 15  ALKPHOS 101  BILITOT 0.5  PROT 7.8  ALBUMIN 3.7   CBG: Recent Labs  Lab 05/30/22 1147 05/30/22 1231 05/30/22 1632 05/30/22 2058 05/31/22 0756  GLUCAP 132* 110* 109* 142* 115*    Discharge time spent: greater than 30 minutes.  Signed: Ezekiel Slocumb, DO [facility] 05/31/2022

## 2022-05-31 NOTE — TOC Progression Note (Addendum)
Transition of Care [facility]) - Progression Note    Patient Details  Name: Erin Yates MRN: [id number] Date of Birth: 20-Dec-1945  Transition of Care [facility]) CM/SW [place], RN Phone Number: 05/31/2022, 8:43 AM  Clinical Narrative:   Approved 7/26 - 7/28, next review due 7/28.  Reference ID: [id number].  Plan Auth: [id number]. Left message for Tammy at [facility], awaiting call back ,  Addendum:  Patient will be discharged to [facility] today, Tammy at facility aware.  Family requests to transport.  Per PT, patient is cleared to travel by car.  Expected Discharge Plan:  (TBD) Barriers to Discharge: Continued Medical Work up  Expected Discharge Plan and Services Expected Discharge Plan:  (TBD)       Living arrangements for the past 2 months: Single Family Home Expected Discharge Date: 05/29/22                                     Social Determinants of Health (SDOH) Interventions    Readmission Risk Interventions     No data to display

## 2022-05-31 NOTE — Care Management Important Message (Signed)
Important Message  Patient Details  Name: Erin Yates MRN: [id number] Date of Birth: Jan 27, 1946   Medicare Important Message Given:  Yes     Juliann Pulse A Branch Pacitti 05/31/2022, 10:15 AM

## 2022-05-31 NOTE — Progress Notes (Signed)
Pt discharged to [facility]; family member transporting her. VSS. Denies pain. Discharge instructions discussed with pt and family member. Prescription handed to pt family along with discharge paperwork. All questions/concerns answered. All needs met; no further needs at this time.

## 2022-06-01 DIAGNOSIS — M6281 Muscle weakness (generalized): Secondary | ICD-10-CM | POA: Diagnosis not present

## 2022-06-01 DIAGNOSIS — I639 Cerebral infarction, unspecified: Secondary | ICD-10-CM | POA: Diagnosis not present

## 2022-06-01 DIAGNOSIS — R519 Headache, unspecified: Secondary | ICD-10-CM | POA: Diagnosis not present

## 2022-06-01 DIAGNOSIS — N1832 Chronic kidney disease, stage 3b: Secondary | ICD-10-CM | POA: Diagnosis not present

## 2022-06-07 DIAGNOSIS — R519 Headache, unspecified: Secondary | ICD-10-CM | POA: Diagnosis not present

## 2022-06-07 DIAGNOSIS — N1832 Chronic kidney disease, stage 3b: Secondary | ICD-10-CM | POA: Diagnosis not present

## 2022-06-07 DIAGNOSIS — I639 Cerebral infarction, unspecified: Secondary | ICD-10-CM | POA: Diagnosis not present

## 2022-06-07 DIAGNOSIS — D649 Anemia, unspecified: Secondary | ICD-10-CM | POA: Diagnosis not present

## 2022-06-08 NOTE — Progress Notes (Signed)
PCP:  Adin Hector, MD Primary Cardiologist: Virl Axe, MD Electrophysiologist: Virl Axe, MD   Erin Yates is a 76 y.o. female seen today for Virl Axe, MD for post hospital follow up.   Admitted 7/23 - 7/26 with acute left PCA stroke with ataxia, dizziness, and confusion as well as vision loss.   Since hospital discharge she is doing very well. She is in SNF and pending home in the next few days. Her husband states she is nearly back to her baseline, with some residual deficits. She appears to have rare word finding difficulty during our exam.  Was smoking ~ a carton every 1-2 weeks, > 1 ppd prior to admission. Remains on Eliquis without bleeding.  Denies SOB with activity at this time. No CP, syncope, or edema.   Past Medical History:  Diagnosis Date   Acute renal failure superimposed on stage 3b chronic kidney disease ([place]) 10/12/2020   Anemia    past  hx   Anxiety    Confusion 05/28/2022   Depression    Diabetes mellitus without complication ([place])    Dysrhythmia    prior to PFO correction   GERD (gastroesophageal reflux disease)    Headache    before PFO closure   Hypercholesteremia    Hypertension    Hypothyroidism    Peripheral vision loss    residual from CVA   PFO (patent foramen ovale)    correction device placed 2010   Right leg numbness    lower leg- residual from CVA   Seasonal allergies    Stroke [facility])    prior to PFO correction   Past Surgical History:  Procedure Laterality Date   ABDOMINAL HYSTERECTOMY     BUBBLE STUDY  02/06/2020   Procedure: BUBBLE STUDY;  Surgeon: Sanda Klein, MD;  Location: [place];  Service: Cardiovascular;;   CARDIAC CATHETERIZATION     [facility]   CATARACT EXTRACTION W/PHACO Right 09/01/2015   Procedure: CATARACT EXTRACTION PHACO AND INTRAOCULAR LENS PLACEMENT ([place]);  Surgeon: Leandrew Koyanagi, MD;  Location: [place];  Service: Ophthalmology;  Laterality: Right;  DIABETIC - oral meds   CATARACT  EXTRACTION W/PHACO Left 10/20/2015   Procedure: CATARACT EXTRACTION PHACO AND INTRAOCULAR LENS PLACEMENT ([facility]);  Surgeon: Leandrew Koyanagi, MD;  Location: [place];  Service: Ophthalmology;  Laterality: Left;  DIABETIC - oral meds   EP IMPLANTABLE DEVICE N/A 04/04/2016   Procedure: Loop Recorder Insertion;  Surgeon: Deboraha Sprang, MD;  Location: [place] [facility];  Service: Cardiovascular;  Laterality: N/A;   FOOT NEUROMA SURGERY Left    IR CT HEAD LTD  10/02/2020   IR PERCUTANEOUS ART THROMBECTOMY/INFUSION INTRACRANIAL INC DIAG ANGIO  10/02/2020   IR US GUIDE VASC ACCESS RIGHT  10/02/2020   NASAL SINUS SURGERY     PATENT FORAMEN OVALE CLOSURE     PATENT FORAMEN OVALE CLOSURE     RADIOLOGY WITH ANESTHESIA N/A 10/02/2020   Procedure: IR WITH ANESTHESIA;  Surgeon: Luanne Bras, MD;  Location: [place];  Service: Radiology;  Laterality: N/A;   TEE WITHOUT CARDIOVERSION N/A 02/09/2016   Procedure: TRANSESOPHAGEAL ECHOCARDIOGRAM (TEE);  Surgeon: Adrian Prows, MD;  Location: [place];  Service: Cardiovascular;  Laterality: N/A;   TEE WITHOUT CARDIOVERSION N/A 11/12/2019   Procedure: TRANSESOPHAGEAL ECHOCARDIOGRAM (TEE);  Surgeon: Minna Merritts, MD;  Location: [facility];  Service: Cardiovascular;  Laterality: N/A;   TEE WITHOUT CARDIOVERSION N/A 02/06/2020   Procedure: TRANSESOPHAGEAL ECHOCARDIOGRAM (TEE);  Surgeon: Croitoru,  Mihai, MD;  Location: [place];  Service: Cardiovascular;  Laterality: N/A;    Current Outpatient Medications  Medication Sig Dispense Refill   albuterol (VENTOLIN HFA) 108 (90 Base) MCG/ACT inhaler Inhale 2 puffs into the lungs every 6 (six) hours as needed for wheezing or shortness of breath. 8 g 0   ALPRAZolam (XANAX) 0.25 MG tablet Take 1 tablet (0.25 mg total) by mouth 2 (two) times daily as needed for anxiety. 30 tablet 0   alum & mag hydroxide-simeth (MAALOX/MYLANTA) 200-200-20 MG/5ML suspension Take 15 mLs by mouth every 4 (four) hours as needed  for indigestion or heartburn. 355 mL 0   apixaban (ELIQUIS) 2.5 MG TABS tablet Take 1 tablet (2.5 mg total) by mouth 2 (two) times daily. 60 tablet 5   atorvastatin (LIPITOR) 80 MG tablet Take 1 tablet (80 mg total) by mouth daily. 30 tablet 5   buPROPion (WELLBUTRIN SR) 150 MG 12 hr tablet Take 1 tablet (150 mg total) by mouth 2 (two) times daily. (Patient taking differently: Take 150-300 mg by mouth 2 (two) times daily. Take 300 mg every morning and 150 every evening) 60 tablet 5   butalbital-acetaminophen-caffeine (FIORICET) 50-325-40 MG tablet Take 1 tablet by mouth every 6 (six) hours as needed for headache. 14 tablet 0   cholecalciferol (VITAMIN D3) 25 MCG (1000 UT) tablet Take 1,000 Units by mouth daily.     desvenlafaxine (PRISTIQ) 100 MG 24 hr tablet Take 100 mg by mouth daily.     ferrous sulfate 325 (65 FE) MG tablet Take 325 mg by mouth every evening.     fluticasone (FLONASE) 50 MCG/ACT nasal spray Place into both nostrils daily as needed for allergies or rhinitis.     hydrOXYzine (ATARAX) 25 MG tablet Take 25 mg by mouth 3 (three) times daily as needed.     nicotine (NICODERM CQ - DOSED IN MG/24 HOURS) 21 mg/24hr patch Place 1 patch (21 mg total) onto the skin daily. 28 patch 0   pantoprazole (PROTONIX) 40 MG tablet Take 40 mg by mouth 2 (two) times daily. Reported on 04/17/2016     traZODone (DESYREL) 50 MG tablet Take 50 mg by mouth at bedtime as needed for sleep.      vitamin B-12 (CYANOCOBALAMIN) 500 MCG tablet Take 500 mcg by mouth every evening.      No current facility-administered medications for this visit.    Allergies  Allergen Reactions   Alendronate Other (See Comments)    Rash    Penicillins Rash    Also swelling and itching  Did it involve swelling of the face/tongue/throat, SOB, or low BP? Yes Did it involve sudden or severe rash/hives, skin peeling, or any reaction on the inside of your mouth or nose? Yes Did you need to seek medical attention at a hospital  or doctor's office? Yes When did it last happen? More than 15 years ago If all above answers are "NO", may proceed with cephalosporin use.     Social History   Socioeconomic History   Marital status: Married    Spouse name: Not on file   Number of children: Not on file   Years of education: Not on file   Highest education level: Not on file  Occupational History   Not on file  Tobacco Use   Smoking status: Every Day    Packs/day: 1.00    Years: 55.00    Total pack years: 55.00    Types: Cigarettes   Smokeless tobacco: Never  Vaping Use   Vaping Use: Never used  Substance and Sexual Activity   Alcohol use: No   Drug use: No   Sexual activity: Not on file  Other Topics Concern   Not on file  Social History Narrative   Not on file   Social Determinants of Health   Financial Resource Strain: Not on file  Food Insecurity: Not on file  Transportation Needs: Not on file  Physical Activity: Not on file  Stress: Not on file  Social Connections: Not on file  Intimate Partner Violence: Not on file     Review of Systems: All other systems reviewed and are otherwise negative except as noted above.  Physical Exam: Vitals:   06/09/22 0845  BP: 112/62  Pulse: 66  SpO2: 94%  Weight: 117 lb (53.1 kg)  Height: 5\' 5"  (1.651 m)    GEN- The patient is well appearing, alert and oriented x 3 today.   HEENT: normocephalic, atraumatic; sclera clear, conjunctiva pink; hearing intact; oropharynx clear; neck supple, no JVP Lymph- no cervical lymphadenopathy Lungs- Clear to ausculation bilaterally, normal work of breathing.  No wheezes, rales, rhonchi Heart- Regular rate and rhythm, no murmurs, rubs or gallops, PMI not laterally displaced GI- soft, non-tender, non-distended, bowel sounds present, no hepatosplenomegaly Extremities- no clubbing, cyanosis, or edema; DP/PT/radial pulses 2+ bilaterally MS- no significant deformity or atrophy Skin- warm and dry, no rash or  lesion Psych- euthymic mood, full affect Neuro- strength and sensation are intact  EKG is not ordered. EKG 7/23 showed NSR. CV strips from admission reviewed and showed NSR  Additional studies reviewed include: Previous EP office notes.   Assessment and Plan:  Cryptogenic stroke   Hypertension   Paroxysmal atrial fibrillation   Sinus bradycardia-resolved   PFO-Amplatzer closure   Tobacco abuse   Aggravation with stroke residual   Hyperlipidemia   Renal insufficiecny    Overall doing well post CVA.   She is back on Eliquis and tolerating it without bleeding. Continue 2.5 mg daily.    Regular on exam today, NSR in hospital. Previously stopped amiodarone because of nausea   Follow up with EP APP in 3 months   Shirley Friar, PA-C  06/09/22 9:57 AM

## 2022-06-09 ENCOUNTER — Ambulatory Visit: Payer: Medicare HMO | Admitting: Student

## 2022-06-09 ENCOUNTER — Encounter: Payer: Self-pay | Admitting: Student

## 2022-06-09 VITALS — BP 112/62 | HR 66 | Ht 65.0 in | Wt 117.0 lb

## 2022-06-09 DIAGNOSIS — I639 Cerebral infarction, unspecified: Secondary | ICD-10-CM | POA: Diagnosis not present

## 2022-06-09 DIAGNOSIS — R001 Bradycardia, unspecified: Secondary | ICD-10-CM

## 2022-06-09 DIAGNOSIS — M6281 Muscle weakness (generalized): Secondary | ICD-10-CM | POA: Diagnosis not present

## 2022-06-09 DIAGNOSIS — I480 Paroxysmal atrial fibrillation: Secondary | ICD-10-CM | POA: Diagnosis not present

## 2022-06-09 DIAGNOSIS — I10 Essential (primary) hypertension: Secondary | ICD-10-CM | POA: Diagnosis not present

## 2022-06-09 DIAGNOSIS — F17200 Nicotine dependence, unspecified, uncomplicated: Secondary | ICD-10-CM | POA: Diagnosis not present

## 2022-06-09 DIAGNOSIS — I679 Cerebrovascular disease, unspecified: Secondary | ICD-10-CM | POA: Diagnosis not present

## 2022-06-09 DIAGNOSIS — I4891 Unspecified atrial fibrillation: Secondary | ICD-10-CM | POA: Diagnosis not present

## 2022-06-09 NOTE — Patient Instructions (Addendum)
Medication Instructions:  Your physician recommends that you continue on your current medications as directed. Please refer to the Current Medication list given to you today.  *If you need a refill on your cardiac medications before your next appointment, please call your pharmacy*   Lab Work: None If you have labs (blood work) drawn today and your tests are completely normal, you will receive your results only by: [place] (if you have MyChart) OR A paper copy in the mail If you have any lab test that is abnormal or we need to change your treatment, we will call you to review the results.   Follow-Up: At [facility], you and your health needs are our priority.  As part of our continuing mission to provide you with exceptional heart care, we have created designated Provider Care Teams.  These Care Teams include your primary Cardiologist (physician) and Advanced Practice Providers (APPs -  Physician Assistants and Nurse Practitioners) who all work together to provide you with the care you need, when you need it.   Your next appointment:    09/15/2022

## 2022-06-12 DIAGNOSIS — I4891 Unspecified atrial fibrillation: Secondary | ICD-10-CM | POA: Diagnosis not present

## 2022-06-12 DIAGNOSIS — M6281 Muscle weakness (generalized): Secondary | ICD-10-CM | POA: Diagnosis not present

## 2022-06-12 DIAGNOSIS — I679 Cerebrovascular disease, unspecified: Secondary | ICD-10-CM | POA: Diagnosis not present

## 2022-06-12 DIAGNOSIS — F17200 Nicotine dependence, unspecified, uncomplicated: Secondary | ICD-10-CM | POA: Diagnosis not present

## 2022-06-20 DIAGNOSIS — E1151 Type 2 diabetes mellitus with diabetic peripheral angiopathy without gangrene: Secondary | ICD-10-CM | POA: Diagnosis not present

## 2022-06-20 DIAGNOSIS — I480 Paroxysmal atrial fibrillation: Secondary | ICD-10-CM | POA: Diagnosis not present

## 2022-06-20 DIAGNOSIS — I10 Essential (primary) hypertension: Secondary | ICD-10-CM | POA: Diagnosis not present

## 2022-06-20 DIAGNOSIS — I69354 Hemiplegia and hemiparesis following cerebral infarction affecting left non-dominant side: Secondary | ICD-10-CM | POA: Diagnosis not present

## 2022-06-20 DIAGNOSIS — E1129 Type 2 diabetes mellitus with other diabetic kidney complication: Secondary | ICD-10-CM | POA: Diagnosis not present

## 2022-06-20 DIAGNOSIS — D6869 Other thrombophilia: Secondary | ICD-10-CM | POA: Diagnosis not present

## 2022-06-20 DIAGNOSIS — R809 Proteinuria, unspecified: Secondary | ICD-10-CM | POA: Diagnosis not present

## 2022-06-20 DIAGNOSIS — N1832 Chronic kidney disease, stage 3b: Secondary | ICD-10-CM | POA: Diagnosis not present

## 2022-06-20 DIAGNOSIS — E1122 Type 2 diabetes mellitus with diabetic chronic kidney disease: Secondary | ICD-10-CM | POA: Diagnosis not present

## 2022-06-20 DIAGNOSIS — I700 Atherosclerosis of aorta: Secondary | ICD-10-CM | POA: Diagnosis not present

## 2022-06-20 DIAGNOSIS — N189 Chronic kidney disease, unspecified: Secondary | ICD-10-CM | POA: Diagnosis not present

## 2022-06-20 DIAGNOSIS — I129 Hypertensive chronic kidney disease with stage 1 through stage 4 chronic kidney disease, or unspecified chronic kidney disease: Secondary | ICD-10-CM | POA: Diagnosis not present

## 2022-06-20 DIAGNOSIS — E611 Iron deficiency: Secondary | ICD-10-CM | POA: Diagnosis not present

## 2022-06-20 DIAGNOSIS — F339 Major depressive disorder, recurrent, unspecified: Secondary | ICD-10-CM | POA: Diagnosis not present

## 2022-06-28 ENCOUNTER — Ambulatory Visit: Payer: Medicare HMO | Admitting: Neurology

## 2022-06-28 VITALS — BP 120/65 | HR 67

## 2022-06-28 DIAGNOSIS — I482 Chronic atrial fibrillation, unspecified: Secondary | ICD-10-CM

## 2022-06-28 DIAGNOSIS — I63532 Cerebral infarction due to unspecified occlusion or stenosis of left posterior cerebral artery: Secondary | ICD-10-CM | POA: Diagnosis not present

## 2022-06-28 DIAGNOSIS — H548 Legal blindness, as defined in USA: Secondary | ICD-10-CM | POA: Diagnosis not present

## 2022-06-28 DIAGNOSIS — H5316 Psychophysical visual disturbances: Secondary | ICD-10-CM | POA: Diagnosis not present

## 2022-06-28 NOTE — Patient Instructions (Signed)
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 I also advised the patient to eat a healthy diet with plenty of whole grains, cereals, fruits and vegetables, exercise regularly and maintain ideal body weight .she was advised to use her walker at all times discussed fall safety precautions.  Followup in the future with me as needed only and no schedule appointment was made.

## 2022-06-28 NOTE — Progress Notes (Signed)
[facility] [address] [place]. [place] [address] [phone]       STROKE FOLLOW UP NOTE  [name] Date of Birth:  Feb 27, 1946 Medical Record Number:  [id number]   Reason for Referral: stroke follow up    SUBJECTIVE:   CHIEF COMPLAINT:  Chief Complaint  Patient presents with   New Patient (Initial Visit)    Room 14    HPI:  Update 06/28/2022 : She returns for follow-up after last visit with Erin Yates, nurse practitioner in February 2022.  She is accompanied by her daughter.  Patient was admitted with another stroke in July 2023 with sudden onset of dizziness and confusion that started few days ago.  He was quite anxious on arrival she had to hold onto furniture to walk.Marland Kitchen  MRI scan showed acute left posterior cerebral artery infarct and multiple old chronic infarcts involving bilateral frontal and occipital lobes and chronic right MCA infarct, MRI of the brain was motion degraded but showed severe stenosis of distal right V4 segment and chronic stenosis of the left And proximal right P2 segment.  Carotid ultrasound showed no significant extracranial stenosis.  LDL cholesterol was 110 mg percent.  Hemoglobin A1c was 7.0.  Patient was on Eliquis for chronic A-fib and family confirmed medication compliance.  Dr. Rory Yates discussed continuing Eliquis versus changing to Pradaxa and lack of definitive evidence supporting this and family chose to stay on Eliquis.  Patient was transferred to inpatient rehab and benefited and is able to walk with a walker.  She is currently doing home physical and Occupational Therapy.  Her vision has not improved and she is nearly blind.  She is however having formed visual hallucinations and objects since her stroke stenosis they are not real and does not act on it.  Family has read up about this.  She has Sherran Needs syndrome.  These hallucinations are not disabling and t they do not want any medication for this.  Patient is not a  candidate for Watchman device as she has a PFO closure device.     Prior visit Erin Billow, NP 12/21/2020, Erin Yates returns for acute visit due to complaints of fingertip numbness.  She is accompanied by her husband.  Both hands finger tip numbness - has been present since the stroke - not worsening but has not improved She has difficulty with hand function due to weakness (present since stroke) and paresthesias when touching any type of object.  She otherwise denies pain in fingertips and reports more numbness sensation Dr. Caryl Yates rx'd gabapentin 100mg  which she started yesterday- reports episode of dizziness and nausea approximately 5 hours after taking and questions possible gabapentin side effect  She also complains of memory loss and confusion since her stroke and worsens with increased anxiety or when she feels overwhelmed.  She does have history of anxiety currently on Pristiq, Wellbutrin, trazodone and Xanax.  She becomes tearful during today's visit when discussing her fingertip numbness.  Denies new or worsening stroke/TIA symptoms since prior visit Reports compliance with Eliquis and Crestor 20 mg daily Blood pressure today 155/74     History provided for reference purposes only Initial visit 11/18/2020 Erin Yates: Erin Yates is being seen for hospital follow-up accompanied by her husband.  She has been doing well since discharge without new or worsening stroke/TIA symptoms.  Reports residual aphasia, mild bilateral hand weakness/decreased dexterity and gait impairment.  Denies residual dysphagia remains on a regular diet without difficulty.  Chronic left hemianopia stable  without worsening.  Currently working with home health PT and completed OT.  PCP recently placed order to initiate [facility] S[facility] and they are currently awaiting to schedule initial eval.  Remains on Eliquis without bleeding or bruising.  Remains on Crestor 40 mg daily without myalgias.  Blood pressure today 142/77.  Evaluated by  cardiology for consideration of watchman device but due to history of PFO with closure, it was advised to continue Eliquis at this time.  No concerns at this time.  Stroke admission 10/02/2020 Erin Yates is a 76 y.o. female with history of diabetes, hypothyroidism, ongoing tobacco use, HTN, HLD, hx of PFO closure 2009, implantable loop, hypertension, atrial fibrillation on eliquis as well as multiple previous strokes (residual visual deficits) who presented on 10/02/2020 with left-sided weakness, a fall, rightward gaze, and confusion.  Personally reviewed hospitalization pertinent progress notes, lab work and imaging with summary provided.  Initially evaluated by Dr. Erlinda Yates with stroke work-up revealing right MCA infarct in setting of right M2 occlusion s/p IR with TICI 3 revascularization with resultant small SAH, infarct embolic likely due to PAF even on Eliquis although noted patient having multiple stroke risk factors.  Recommend to continue Eliquis for secondary stroke prevention and to follow-up with cardiology Dr. Caryl Yates outpatient for possible candidate for watchman device.  History of HTN on amlodipine and Cozaar which was resumed at hospital discharge.  History of HLD with LDL 44 on atorvastatin 80 mg daily and recommended to transition to Crestor 40 mg daily per Dr. Caryl Yates.  History of DM well controlled with A1c 6.2.  Current tobacco use with smoking cessation counseling provided.  Other stroke risk factors include multiple strokes, advanced age and history of PFO closure.  Other active problems include vascular dementia with agitation, confusion and delirium, CKD, aortic arthrosclerosis, hypomagnesemia, and hypokalemia.  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  Evaluated by therapies who recommended discharge home with [facility] PT/OT.   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 However, pt does have multiple stroke risk factors.  CT Head -  No acute infarct or intracranial hemorrhage identified. ASPECTS is 10. Hyperdense right MCA. Multiple chronic infarcts.  CTA H&N - Emergent proximal right M2 occlusion. Mild carotid and vertebral atherosclerosis without significant stenosis.  IR TICI3 of right M2 occlusion, Small volume SAH at the sylvian fissure and right perisylvian sulci   CT head 11/29 an evolving posterior right MCA territory infarct with progressive edema. There is hyperdensity in the region of the right sylvian fissure MRI and MRA head - initially hold off due to agitation and noncooperation, now pt refused due to claustrophobia Repeat CT 12/2 unchanged large right MCA stroke with small sylvian fissure SAH 2D Echo - EF 60-65.%. degenerative MV w/ echodensity base posterior leaflet c/w severe P3 prolapse Lacey Jensen Virus 2 - negative TG 442, LDL - 44 HgbA1c - 6.2 Eliquis (apixaban) daily prior to admission, now Eliquis restarted  therapy recommendations:  CIR->HH PT, OT Disposition:  home w/ HH  Hx stroke/TIA 09/2007 with RLE hp 11/2007 with LLE numbness 01/2008 probable stroke with RUE hp 07/2010 L occipital and R basal ganglia infarct PFO diagnosed 2009, underwent closure 04/2009 by Dr. Jenne Pane at [facility] 02/2016 left hemianopia status post TPA.  MRI showed right parietal and temporal infarcts.  CT head and neck right P3 occlusion.  EF 60 to 65%.  TEE showed PFO closure device in place.  LDL 79 A1c 6.4.  Discharged with DAPT. 11/2019 admitted for aphasia.  MRI showed left parietal lobe infarct.  MRI high-grade stenosis left A1, moderate stenosis right P2. Loop recorder positive for A. fib at that time, put on Eliquis. Follows with Dr. Leonie [facility] at [facility]     ROS:   14 system review of systems performed and negative with exception of those listed in HPI  PMH:  Past Medical  History:  Diagnosis Date   Acute renal failure superimposed on stage 3b chronic kidney disease ([place]) 10/12/2020   Anemia    past  hx   Anxiety    Confusion 05/28/2022   Depression    Diabetes mellitus without complication ([place])    Dysrhythmia    prior to PFO correction   GERD (gastroesophageal reflux disease)    Headache    before PFO closure   Hypercholesteremia    Hypertension    Hypothyroidism    Peripheral vision loss    residual from CVA   PFO (patent foramen ovale)    correction device placed 2010   Right leg numbness    lower leg- residual from CVA   Seasonal allergies    Stroke [facility])    prior to PFO correction    PSH:  Past Surgical History:  Procedure Laterality Date   ABDOMINAL HYSTERECTOMY     BUBBLE STUDY  02/06/2020   Procedure: BUBBLE STUDY;  Surgeon: Sanda Klein, MD;  Location: [place];  Service: Cardiovascular;;   CARDIAC CATHETERIZATION     [facility]   CATARACT EXTRACTION W/PHACO Right 09/01/2015   Procedure: CATARACT EXTRACTION PHACO AND INTRAOCULAR LENS PLACEMENT ([place]);  Surgeon: Leandrew Koyanagi, MD;  Location: [place];  Service: Ophthalmology;  Laterality: Right;  DIABETIC - oral meds   CATARACT EXTRACTION W/PHACO Left 10/20/2015   Procedure: CATARACT EXTRACTION PHACO AND INTRAOCULAR LENS PLACEMENT ([facility]);  Surgeon: Leandrew Koyanagi, MD;  Location: [place];  Service: Ophthalmology;  Laterality: Left;  DIABETIC - oral meds   EP IMPLANTABLE DEVICE N/A 04/04/2016   Procedure: Loop Recorder Insertion;  Surgeon: Deboraha Sprang, MD;  Location: [place] CV LAB;  Service: Cardiovascular;  Laterality: N/A;   FOOT NEUROMA SURGERY Left    IR CT HEAD LTD  10/02/2020   IR PERCUTANEOUS ART THROMBECTOMY/INFUSION INTRACRANIAL INC DIAG ANGIO  10/02/2020   IR US GUIDE VASC ACCESS RIGHT  10/02/2020   NASAL SINUS SURGERY     PATENT FORAMEN OVALE CLOSURE     PATENT FORAMEN OVALE CLOSURE     RADIOLOGY WITH ANESTHESIA N/A 10/02/2020    Procedure: IR WITH ANESTHESIA;  Surgeon: Luanne Bras, MD;  Location: [place];  Service: Radiology;  Laterality: N/A;   TEE WITHOUT CARDIOVERSION N/A 02/09/2016   Procedure: TRANSESOPHAGEAL ECHOCARDIOGRAM (TEE);  Surgeon: Adrian Prows, MD;  Location: [place];  Service: Cardiovascular;  Laterality: N/A;   TEE WITHOUT CARDIOVERSION N/A 11/12/2019   Procedure: TRANSESOPHAGEAL ECHOCARDIOGRAM (TEE);  Surgeon: Minna Merritts, MD;  Location: [facility];  Service: Cardiovascular;  Laterality: N/A;   TEE WITHOUT CARDIOVERSION N/A 02/06/2020   Procedure: TRANSESOPHAGEAL ECHOCARDIOGRAM (TEE);  Surgeon: Sanda Klein, MD;  Location: [facility];  Service: Cardiovascular;  Laterality: N/A;    Social History:  Social History   Socioeconomic History   Marital status: Married    Spouse name: Not on file   Number of children: Not on file  Years of education: Not on file   Highest education level: Not on file  Occupational History   Not on file  Tobacco Use   Smoking status: Every Day    Packs/day: 1.00    Years: 55.00    Total pack years: 55.00    Types: Cigarettes   Smokeless tobacco: Never  Vaping Use   Vaping Use: Never used  Substance and Sexual Activity   Alcohol use: No   Drug use: No   Sexual activity: Not on file  Other Topics Concern   Not on file  Social History Narrative   Not on file   Social Determinants of Health   Financial Resource Strain: Not on file  Food Insecurity: Not on file  Transportation Needs: Not on file  Physical Activity: Not on file  Stress: Not on file  Social Connections: Not on file  Intimate Partner Violence: Not on file    Family History:  Family History  Problem Relation Age of Onset   Dementia Father    Breast cancer Sister 51    Medications:   Current Outpatient Medications on File Prior to Visit  Medication Sig Dispense Refill   albuterol (VENTOLIN HFA) 108 (90 Base) MCG/ACT inhaler Inhale 2 puffs into the lungs every 6 (six) hours  as needed for wheezing or shortness of breath. 8 g 0   ALPRAZolam (XANAX) 0.25 MG tablet Take 1 tablet (0.25 mg total) by mouth 2 (two) times daily as needed for anxiety. 30 tablet 0   alum & mag hydroxide-simeth (MAALOX/MYLANTA) 200-200-20 MG/5ML suspension Take 15 mLs by mouth every 4 (four) hours as needed for indigestion or heartburn. 355 mL 0   apixaban (ELIQUIS) 2.5 MG TABS tablet Take 1 tablet (2.5 mg total) by mouth 2 (two) times daily. 60 tablet 5   atorvastatin (LIPITOR) 80 MG tablet Take 1 tablet (80 mg total) by mouth daily. 30 tablet 5   buPROPion (WELLBUTRIN SR) 150 MG 12 hr tablet Take 1 tablet (150 mg total) by mouth 2 (two) times daily. (Patient taking differently: Take 150-300 mg by mouth 2 (two) times daily. Take 300 mg every morning and 150 every evening) 60 tablet 5   butalbital-acetaminophen-caffeine (FIORICET) 50-325-40 MG tablet Take 1 tablet by mouth every 6 (six) hours as needed for headache. 14 tablet 0   cholecalciferol (VITAMIN D3) 25 MCG (1000 UT) tablet Take 1,000 Units by mouth daily.     desvenlafaxine (PRISTIQ) 100 MG 24 hr tablet Take 100 mg by mouth daily.     ferrous sulfate 325 (65 FE) MG tablet Take 325 mg by mouth every evening.     fluticasone (FLONASE) 50 MCG/ACT nasal spray Place into both nostrils daily as needed for allergies or rhinitis.     hydrOXYzine (ATARAX) 25 MG tablet Take 25 mg by mouth 3 (three) times daily as needed.     nicotine (NICODERM CQ - DOSED IN MG/24 HOURS) 21 mg/24hr patch Place 1 patch (21 mg total) onto the skin daily. 28 patch 0   pantoprazole (PROTONIX) 40 MG tablet Take 40 mg by mouth 2 (two) times daily. Reported on 04/17/2016     traZODone (DESYREL) 50 MG tablet Take 50 mg by mouth at bedtime as needed for sleep.      vitamin B-12 (CYANOCOBALAMIN) 500 MCG tablet Take 500 mcg by mouth every evening.      No current facility-administered medications on file prior to visit.    Allergies:   Allergies  Allergen Reactions  Alendronate Other (See Comments)    Rash    Penicillins Rash    Also swelling and itching  Did it involve swelling of the face/tongue/throat, SOB, or low BP? Yes Did it involve sudden or severe rash/hives, skin peeling, or any reaction on the inside of your mouth or nose? Yes Did you need to seek medical attention at a hospital or doctor's office? Yes When did it last happen? More than 15 years ago If all above answers are "NO", may proceed with cephalosporin use.       OBJECTIVE:  Physical Exam  Vitals:   06/28/22 0924  BP: 120/65  Pulse: 67   There is no height or weight on file to calculate BMI. No results found.  General: Frail anxious elderly Caucasian female, seated,  Head: head normocephalic and atraumatic.   Neck: supple with no carotid or supraclavicular bruits Cardiovascular: irregular rate and rhythm, no murmurs Musculoskeletal: no deformity Skin:  no rash/petichiae Vascular:  Normal pulses all extremities   Neurologic Exam Mental Status: Awake and fully alert. Nonfluent speech with hesitancy and occasional word finding difficulty.  Occasional delay in responses and delayed processing time.  Oriented to place and time. Recent memory subjectively impaired and remote memory impaired. Attention span, concentration and fund of knowledge appropriate during visit. Mood and affect appropriate.  Cranial Nerves: Pupils equal, briskly reactive to light. Extraocular movements full without nystagmus.  Legally blind bilaterally visual fields diminished only able to see at a distance in the left eye.  In Central and upper portion.  Hearing diminished bilaterally.  Facial sensation intact. Face, tongue, palate moves normally and symmetrically.  Motor: 4+-5-/5 throughout all tested extremities with questionable effort Sensory.: intact to touch , pinprick , position and vibratory sensation except hypersensation with paresthesias with pinprick fingers distally bilaterally.   Coordination: Rapid alternating slowed in all extremities. Finger-to-nose and heel-to-shin mild incoordination throughout and slowed movements but no evidence of ataxia or dysmetria.    No evidence of tremor at rest.  No cogwheel rigidity. Gait and Station: Deferred as patient is in wheelchair and did not get her walker Reflexes: 1+ and symmetric. Toes downgoing.       ASSESSMENT: Erin Yates is a 76 y.o. year old female presented after a fall with left-sided weakness, right gaze preference and confusion on 10/02/2020 with stroke work-up revealing right MCA infarct in setting of right M2 occlusion s/p IR with TICI 3 reperfusion w/ resultant small SAH, infarct embolic likely secondary to PAF even on Eliquis however noted patient having multiple stroke risk factors. Vascular risk factors include HTN, HLD, DM, A. fib, current tobacco use, hx of PFO s/p closure 2009, history of multiple strokes (see HPI), and advanced age.  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 I also advised the patient to eat a healthy diet with plenty of whole  grains, cereals, fruits and vegetables, exercise regularly and maintain ideal body weight .she was advised to use her walker at all times discussed fall  safety precautions.  Followup in the future with me as needed only and no schedule appointment was made.  Greater than 50% time during this 35-minute visit was spent on counseling and coordination of care about her recurrent embolic stroke despite being on anticoagulation with Eliquis for A-fib and resultant near blindness  Antony Contras, MD  [facility] [address] [place], [place] [address]  Phone [phone] Fax [phone] Note: This document was prepared with digital dictation and possible smart phrase technology. Any transcriptional errors that result from this process are unintentional.

## 2022-07-11 DIAGNOSIS — D631 Anemia in chronic kidney disease: Secondary | ICD-10-CM | POA: Diagnosis not present

## 2022-07-11 DIAGNOSIS — I69398 Other sequelae of cerebral infarction: Secondary | ICD-10-CM | POA: Diagnosis not present

## 2022-07-11 DIAGNOSIS — N1832 Chronic kidney disease, stage 3b: Secondary | ICD-10-CM | POA: Diagnosis not present

## 2022-07-11 DIAGNOSIS — I69311 Memory deficit following cerebral infarction: Secondary | ICD-10-CM | POA: Diagnosis not present

## 2022-07-11 DIAGNOSIS — H547 Unspecified visual loss: Secondary | ICD-10-CM | POA: Diagnosis not present

## 2022-07-11 DIAGNOSIS — E1122 Type 2 diabetes mellitus with diabetic chronic kidney disease: Secondary | ICD-10-CM | POA: Diagnosis not present

## 2022-07-11 DIAGNOSIS — I129 Hypertensive chronic kidney disease with stage 1 through stage 4 chronic kidney disease, or unspecified chronic kidney disease: Secondary | ICD-10-CM | POA: Diagnosis not present

## 2022-07-20 ENCOUNTER — Other Ambulatory Visit: Payer: Medicare HMO | Admitting: Student

## 2022-07-20 DIAGNOSIS — R519 Headache, unspecified: Secondary | ICD-10-CM

## 2022-07-20 DIAGNOSIS — F419 Anxiety disorder, unspecified: Secondary | ICD-10-CM

## 2022-07-20 DIAGNOSIS — Z515 Encounter for palliative care: Secondary | ICD-10-CM

## 2022-07-20 DIAGNOSIS — I6330 Cerebral infarction due to thrombosis of unspecified cerebral artery: Secondary | ICD-10-CM

## 2022-07-20 DIAGNOSIS — F32A Depression, unspecified: Secondary | ICD-10-CM

## 2022-07-20 NOTE — Progress Notes (Signed)
Therapist, nutritional Palliative Care Consult Note Telephone: [phone]  Fax: [phone]    Date of encounter: 07/20/22 1:36 PM PATIENT NAME: Erin Yates [address] [place] [address]   [phone] (home)  DOB: [birth year] MRN: [id number] PRIMARY CARE PROVIDER:    Lynnea Ferrier, MD,  [address] [facility] [place] [address] [phone]  REFERRING PROVIDER:   Lynnea Ferrier, MD [address] [facility] [place],  [place] [address] [phone]  RESPONSIBLE PARTY:    Contact Information     Name Relation Home Work Erin Yates Spouse [phone]     Erin Yates Daughter   [phone]   Erin, [name]   [phone]        I met face to face with patient and family in the home. Palliative Care was asked to follow this patient by consultation request of  Lynnea Ferrier, MD to address advance care planning and complex medical decision making. This is a follow up visit.                                   ASSESSMENT AND PLAN / RECOMMENDATIONS:   Advance Care Planning/Goals of Care: Goals include to maximize quality of life and symptom management. Patient/health care surrogate gave his/her permission to discuss. Our advance care planning conversation included a discussion about:    The value and importance of advance care planning  Experiences with loved ones who have been seriously ill or have died  Exploration of personal, cultural or spiritual beliefs that might influence medical decisions  Exploration of goals of care in the event of a sudden injury or illness  Daughter Erin Yates-HCPOA CODE STATUS: TBD  Education provided on Palliative Medicine. Will provide symptom management as needed, ongoing support. Patient and her husband state they recently completed a Living Will; they are unsure of if they would want CPR or not. They are going to speak with daughter  Erin Yates.   Symptom Management/Plan:  CVA-patient s/p acute left PCA stroke. She has had multiple strokes. Patient with visual loss, weakness, hallucinations. Continue Eliquis 2.5 mg BID, atorvastatin 80 mg daily. Patient is to continue therapy as directed. Monitor for worsening of symptoms. Monitor for falls/safety. Continue using walker for ambulation. Patient is to follow up with neurology as scheduled.   Headaches-continue Fioricet every 6 hrs PRN.  Anxiety and depression-continue Wellbutrin 150 mg BID, Pristiq 100 mg daily, alprazolam 0.25 mg BID PRN. Monitor for worsening symptoms.    Follow up Palliative Care Visit: Palliative care will continue to follow for complex medical decision making, advance care planning, and clarification of goals. Return in 10-12 weeks or prn.   This visit was coded based on medical decision making (MDM).  PPS: 50%  HOSPICE ELIGIBILITY/DIAGNOSIS: TBD  Chief Complaint: Palliative Medicine initial consult.   HISTORY OF PRESENT ILLNESS:  Erin Yates is a 76 y.o. year old female  with CKD 3b, atrial fibrillation, hypertension, hyperlipidemia, PVD, anxiety, depression, valvular heart disease, vitamin B12 deficiency, type 2 diabetes, GERD, history of CVA, TIAs.    Patient hospitalized in July due to acute left PCA stroke. She went to rehab and returned home. She is now receiving home health PT and OT. She is using walker for ambulation. She endorses visual loss, hallucinations s/p CVA. She also endorses increased forgetfulness,  memory deficits. Denies pain, except for headaches. No shortness of breath, nausea or constipation.  Feels anxiety is managed with alprazolam. She is also taking hydroxyzine PRN itching, which helps with anxiety as well. Sarna lotion is helpful for itching. Appetite has been fair; drinking 2 Boost a day. Sleeping good.  History obtained from review of EMR, discussion with primary team, and interview with family, facility staff/caregiver  and/or [name].  I reviewed available labs, medications, imaging, studies and related documents from the EMR.  Records reviewed and summarized above.   ROS  A 10- point ROS is negative, except for the pertinent positives and negatives detailed per the HPI.     Physical Exam: Pulse 68, resp 16, b/p 120/64, sats 96% on room air Constitutional: NAD General: frail appearing, thin EYES: anicteric sclera, lids intact, no discharge  ENMT: intact hearing, oral mucous membranes moist CV: S1S2, RRR, no LE edema Pulmonary: LCTA, no increased work of breathing, no cough, room air Abdomen: normo-active BS + 4 quadrants, soft and non tender, no ascites GU: deferred MSK: sarcopenia, moves all extremities, ambulatory with walker Skin: warm and dry, no rashes or wounds on visible skin Neuro: +generalized weakness,  Psych: mildly anxious affect, A and O x 3, forgetful Hem/lymph/immuno: no widespread bruising   Thank you for the opportunity to participate in the care of [name].  The palliative care team will continue to follow. Please call our office at [phone] if we can be of additional assistance.   Ezekiel Slocumb, NP   COVID-19 PATIENT SCREENING TOOL Asked and negative response unless otherwise noted:   Have you had symptoms of covid, tested positive or been in contact with someone with symptoms/positive test in the past 5-10 days? No

## 2022-08-15 DIAGNOSIS — E034 Atrophy of thyroid (acquired): Secondary | ICD-10-CM | POA: Diagnosis not present

## 2022-08-15 DIAGNOSIS — E7849 Other hyperlipidemia: Secondary | ICD-10-CM | POA: Diagnosis not present

## 2022-08-15 DIAGNOSIS — E538 Deficiency of other specified B group vitamins: Secondary | ICD-10-CM | POA: Diagnosis not present

## 2022-08-15 DIAGNOSIS — R809 Proteinuria, unspecified: Secondary | ICD-10-CM | POA: Diagnosis not present

## 2022-08-15 DIAGNOSIS — E1129 Type 2 diabetes mellitus with other diabetic kidney complication: Secondary | ICD-10-CM | POA: Diagnosis not present

## 2022-08-15 DIAGNOSIS — I10 Essential (primary) hypertension: Secondary | ICD-10-CM | POA: Diagnosis not present

## 2022-08-15 DIAGNOSIS — E611 Iron deficiency: Secondary | ICD-10-CM | POA: Diagnosis not present

## 2022-08-15 DIAGNOSIS — N1832 Chronic kidney disease, stage 3b: Secondary | ICD-10-CM | POA: Diagnosis not present

## 2022-08-21 DIAGNOSIS — I700 Atherosclerosis of aorta: Secondary | ICD-10-CM | POA: Diagnosis not present

## 2022-08-21 DIAGNOSIS — E039 Hypothyroidism, unspecified: Secondary | ICD-10-CM | POA: Diagnosis not present

## 2022-08-21 DIAGNOSIS — E1151 Type 2 diabetes mellitus with diabetic peripheral angiopathy without gangrene: Secondary | ICD-10-CM | POA: Diagnosis not present

## 2022-08-21 DIAGNOSIS — D631 Anemia in chronic kidney disease: Secondary | ICD-10-CM | POA: Diagnosis not present

## 2022-08-21 DIAGNOSIS — F339 Major depressive disorder, recurrent, unspecified: Secondary | ICD-10-CM | POA: Diagnosis not present

## 2022-08-21 DIAGNOSIS — E785 Hyperlipidemia, unspecified: Secondary | ICD-10-CM | POA: Diagnosis not present

## 2022-08-21 DIAGNOSIS — D6869 Other thrombophilia: Secondary | ICD-10-CM | POA: Diagnosis not present

## 2022-08-21 DIAGNOSIS — I480 Paroxysmal atrial fibrillation: Secondary | ICD-10-CM | POA: Diagnosis not present

## 2022-08-21 DIAGNOSIS — I69354 Hemiplegia and hemiparesis following cerebral infarction affecting left non-dominant side: Secondary | ICD-10-CM | POA: Diagnosis not present

## 2022-09-04 DIAGNOSIS — R809 Proteinuria, unspecified: Secondary | ICD-10-CM | POA: Diagnosis not present

## 2022-09-04 DIAGNOSIS — N1832 Chronic kidney disease, stage 3b: Secondary | ICD-10-CM | POA: Diagnosis not present

## 2022-09-04 DIAGNOSIS — M810 Age-related osteoporosis without current pathological fracture: Secondary | ICD-10-CM | POA: Diagnosis not present

## 2022-09-04 DIAGNOSIS — E1129 Type 2 diabetes mellitus with other diabetic kidney complication: Secondary | ICD-10-CM | POA: Diagnosis not present

## 2022-09-15 ENCOUNTER — Ambulatory Visit: Payer: Medicare HMO | Admitting: Student

## 2022-09-21 ENCOUNTER — Other Ambulatory Visit: Payer: Self-pay

## 2022-09-21 ENCOUNTER — Emergency Department: Payer: Medicare HMO

## 2022-09-21 ENCOUNTER — Encounter: Payer: Self-pay | Admitting: Radiology

## 2022-09-21 ENCOUNTER — Observation Stay
Admission: EM | Admit: 2022-09-21 | Discharge: 2022-09-22 | Disposition: A | Discharge status: Home / Self Care | Payer: Medicare HMO | Attending: Internal Medicine | Admitting: Internal Medicine

## 2022-09-21 DIAGNOSIS — R29818 Other symptoms and signs involving the nervous system: Secondary | ICD-10-CM | POA: Diagnosis not present

## 2022-09-21 DIAGNOSIS — R42 Dizziness and giddiness: Secondary | ICD-10-CM | POA: Diagnosis not present

## 2022-09-21 DIAGNOSIS — F17210 Nicotine dependence, cigarettes, uncomplicated: Secondary | ICD-10-CM | POA: Diagnosis not present

## 2022-09-21 DIAGNOSIS — H81399 Other peripheral vertigo, unspecified ear: Principal | ICD-10-CM | POA: Diagnosis present

## 2022-09-21 DIAGNOSIS — Z8673 Personal history of transient ischemic attack (TIA), and cerebral infarction without residual deficits: Secondary | ICD-10-CM

## 2022-09-21 DIAGNOSIS — E785 Hyperlipidemia, unspecified: Secondary | ICD-10-CM | POA: Diagnosis present

## 2022-09-21 DIAGNOSIS — I129 Hypertensive chronic kidney disease with stage 1 through stage 4 chronic kidney disease, or unspecified chronic kidney disease: Secondary | ICD-10-CM | POA: Insufficient documentation

## 2022-09-21 DIAGNOSIS — I480 Paroxysmal atrial fibrillation: Secondary | ICD-10-CM | POA: Diagnosis present

## 2022-09-21 DIAGNOSIS — E039 Hypothyroidism, unspecified: Secondary | ICD-10-CM | POA: Insufficient documentation

## 2022-09-21 DIAGNOSIS — D509 Iron deficiency anemia, unspecified: Secondary | ICD-10-CM | POA: Diagnosis not present

## 2022-09-21 DIAGNOSIS — I639 Cerebral infarction, unspecified: Secondary | ICD-10-CM | POA: Diagnosis not present

## 2022-09-21 DIAGNOSIS — F419 Anxiety disorder, unspecified: Secondary | ICD-10-CM | POA: Diagnosis not present

## 2022-09-21 DIAGNOSIS — I10 Essential (primary) hypertension: Secondary | ICD-10-CM | POA: Diagnosis not present

## 2022-09-21 DIAGNOSIS — N1832 Chronic kidney disease, stage 3b: Secondary | ICD-10-CM | POA: Diagnosis present

## 2022-09-21 DIAGNOSIS — R29898 Other symptoms and signs involving the musculoskeletal system: Secondary | ICD-10-CM

## 2022-09-21 DIAGNOSIS — I6523 Occlusion and stenosis of bilateral carotid arteries: Secondary | ICD-10-CM | POA: Diagnosis not present

## 2022-09-21 DIAGNOSIS — R2681 Unsteadiness on feet: Secondary | ICD-10-CM | POA: Diagnosis not present

## 2022-09-21 DIAGNOSIS — F418 Other specified anxiety disorders: Secondary | ICD-10-CM | POA: Diagnosis not present

## 2022-09-21 DIAGNOSIS — Z720 Tobacco use: Secondary | ICD-10-CM | POA: Diagnosis present

## 2022-09-21 DIAGNOSIS — I6502 Occlusion and stenosis of left vertebral artery: Secondary | ICD-10-CM | POA: Diagnosis not present

## 2022-09-21 DIAGNOSIS — E1122 Type 2 diabetes mellitus with diabetic chronic kidney disease: Secondary | ICD-10-CM | POA: Insufficient documentation

## 2022-09-21 DIAGNOSIS — Z79899 Other long term (current) drug therapy: Secondary | ICD-10-CM | POA: Insufficient documentation

## 2022-09-21 DIAGNOSIS — Z7901 Long term (current) use of anticoagulants: Secondary | ICD-10-CM | POA: Diagnosis not present

## 2022-09-21 DIAGNOSIS — E1129 Type 2 diabetes mellitus with other diabetic kidney complication: Secondary | ICD-10-CM | POA: Diagnosis present

## 2022-09-21 LAB — URINE DRUG SCREEN, QUALITATIVE (ARMC ONLY)
Amphetamines, Ur Screen: NOT DETECTED
Barbiturates, Ur Screen: POSITIVE — AB
Benzodiazepine, Ur Scrn: POSITIVE — AB
Cannabinoid 50 Ng, Ur ~~LOC~~: NOT DETECTED
Cocaine Metabolite,Ur ~~LOC~~: NOT DETECTED
MDMA (Ecstasy)Ur Screen: NOT DETECTED
Methadone Scn, Ur: NOT DETECTED
Opiate, Ur Screen: NOT DETECTED
Phencyclidine (PCP) Ur S: NOT DETECTED
Tricyclic, Ur Screen: NOT DETECTED

## 2022-09-21 LAB — COMPREHENSIVE METABOLIC PANEL
ALT: 16 U/L (ref 0–44)
AST: 20 U/L (ref 15–41)
Albumin: 3.6 g/dL (ref 3.5–5.0)
Alkaline Phosphatase: 97 U/L (ref 38–126)
Anion gap: 9 (ref 5–15)
BUN: 17 mg/dL (ref 8–23)
CO2: 25 mmol/L (ref 22–32)
Calcium: 9.3 mg/dL (ref 8.9–10.3)
Chloride: 106 mmol/L (ref 98–111)
Creatinine, Ser: 1.64 mg/dL — ABNORMAL HIGH (ref 0.44–1.00)
GFR, Estimated: 32 mL/min — ABNORMAL LOW (ref >60)
Glucose, Bld: 190 mg/dL — ABNORMAL HIGH (ref 70–99)
Potassium: 4.1 mmol/L (ref 3.5–5.1)
Sodium: 140 mmol/L (ref 135–145)
Total Bilirubin: 0.4 mg/dL (ref 0.3–1.2)
Total Protein: 7.6 g/dL (ref 6.5–8.1)

## 2022-09-21 LAB — URINALYSIS, ROUTINE W REFLEX MICROSCOPIC
Bilirubin Urine: NEGATIVE
Glucose, UA: NEGATIVE mg/dL
Hgb urine dipstick: NEGATIVE
Ketones, ur: NEGATIVE mg/dL
Leukocytes,Ua: NEGATIVE
Nitrite: NEGATIVE
Protein, ur: NEGATIVE mg/dL
Specific Gravity, Urine: 1.019 (ref 1.005–1.030)
pH: 7 (ref 5.0–8.0)

## 2022-09-21 LAB — CBC WITH DIFFERENTIAL/PLATELET
Abs Immature Granulocytes: 0.02 10*3/uL (ref 0.00–0.07)
Basophils Absolute: 0.1 10*3/uL (ref 0.0–0.1)
Basophils Relative: 1 %
Eosinophils Absolute: 0.2 10*3/uL (ref 0.0–0.5)
Eosinophils Relative: 2 %
HCT: 33.6 % — ABNORMAL LOW (ref 36.0–46.0)
Hemoglobin: 11.4 g/dL — ABNORMAL LOW (ref 12.0–15.0)
Immature Granulocytes: 0 %
Lymphocytes Relative: 29 %
Lymphs Abs: 2.3 10*3/uL (ref 0.7–4.0)
MCH: 31.8 pg (ref 26.0–34.0)
MCHC: 33.9 g/dL (ref 30.0–36.0)
MCV: 93.9 fL (ref 80.0–100.0)
Monocytes Absolute: 0.6 10*3/uL (ref 0.1–1.0)
Monocytes Relative: 8 %
Neutro Abs: 4.6 10*3/uL (ref 1.7–7.7)
Neutrophils Relative %: 60 %
Platelets: 275 10*3/uL (ref 150–400)
RBC: 3.58 MIL/uL — ABNORMAL LOW (ref 3.87–5.11)
RDW: 12.7 % (ref 11.5–15.5)
WBC: 7.8 10*3/uL (ref 4.0–10.5)
nRBC: 0 % (ref 0.0–0.2)

## 2022-09-21 LAB — PROTIME-INR
INR: 1 (ref 0.8–1.2)
Prothrombin Time: 13.5 seconds (ref 11.4–15.2)

## 2022-09-21 LAB — CBG MONITORING, ED: Glucose-Capillary: 194 mg/dL — ABNORMAL HIGH (ref 70–99)

## 2022-09-21 LAB — ETHANOL: Alcohol, Ethyl (B): <10 mg/dL (ref <10)

## 2022-09-21 LAB — APTT: aPTT: 32 seconds (ref 24–36)

## 2022-09-21 MED ORDER — ACETAMINOPHEN 160 MG/5ML PO SOLN: 650.0000 mg | ORAL | Status: DC | PRN

## 2022-09-21 MED ORDER — FLUTICASONE PROPIONATE 50 MCG/ACT NA SUSP: 1.0000 | Freq: Every day | NASAL | Status: DC | PRN

## 2022-09-21 MED ORDER — HYDRALAZINE HCL 20 MG/ML IJ SOLN: 5.0000 mg | INTRAMUSCULAR | Status: DC | PRN

## 2022-09-21 MED ORDER — CYANOCOBALAMIN 500 MCG PO TABS
500.0000 ug | ORAL_TABLET | Freq: Every evening | ORAL | Status: DC
  Administered 2022-09-21: 500 ug via ORAL
  Filled 2022-09-21: qty 1

## 2022-09-21 MED ORDER — BUTALBITAL-APAP-CAFFEINE 50-325-40 MG PO TABS
1.0000 | ORAL_TABLET | Freq: Four times a day (QID) | ORAL | Status: DC | PRN
  Administered 2022-09-21 – 2022-09-22 (×4): 1 via ORAL
  Filled 2022-09-21 (×5): qty 1

## 2022-09-21 MED ORDER — SENNOSIDES-DOCUSATE SODIUM 8.6-50 MG PO TABS: 1.0000 | ORAL_TABLET | Freq: Every evening | ORAL | Status: DC | PRN

## 2022-09-21 MED ORDER — ALPRAZOLAM 0.25 MG PO TABS
0.2500 mg | ORAL_TABLET | Freq: Two times a day (BID) | ORAL | Status: DC | PRN
  Administered 2022-09-22: 0.25 mg via ORAL
  Filled 2022-09-21: qty 1

## 2022-09-21 MED ORDER — BUPROPION HCL ER (SR) 150 MG PO TB12
300.0000 mg | ORAL_TABLET | Freq: Every day | ORAL | Status: DC
  Administered 2022-09-22: 300 mg via ORAL
  Filled 2022-09-21: qty 2

## 2022-09-21 MED ORDER — LORAZEPAM 2 MG/ML IJ SOLN: 2.0000 mg | Freq: Once | INTRAMUSCULAR | Status: AC

## 2022-09-21 MED ORDER — TRAZODONE HCL 50 MG PO TABS
50.0000 mg | ORAL_TABLET | Freq: Every evening | ORAL | Status: DC | PRN
  Administered 2022-09-21: 50 mg via ORAL
  Filled 2022-09-21: qty 1

## 2022-09-21 MED ORDER — BUPROPION HCL ER (SR) 150 MG PO TB12
150.0000 mg | ORAL_TABLET | Freq: Every day | ORAL | Status: DC
  Administered 2022-09-21: 150 mg via ORAL
  Filled 2022-09-21: qty 1

## 2022-09-21 MED ORDER — ACETAMINOPHEN 325 MG PO TABS
650.0000 mg | ORAL_TABLET | ORAL | Status: DC | PRN
  Administered 2022-09-21 – 2022-09-22 (×2): 650 mg via ORAL
  Filled 2022-09-21 (×2): qty 2

## 2022-09-21 MED ORDER — ATORVASTATIN CALCIUM 20 MG PO TABS
80.0000 mg | ORAL_TABLET | Freq: Every day | ORAL | Status: DC
  Administered 2022-09-21 – 2022-09-22 (×2): 80 mg via ORAL
  Filled 2022-09-21 (×2): qty 4

## 2022-09-21 MED ORDER — ALBUTEROL SULFATE (2.5 MG/3ML) 0.083% IN NEBU: 3.0000 mL | INHALATION_SOLUTION | Freq: Four times a day (QID) | RESPIRATORY_TRACT | Status: DC | PRN

## 2022-09-21 MED ORDER — PANTOPRAZOLE SODIUM 40 MG PO TBEC
40.0000 mg | DELAYED_RELEASE_TABLET | Freq: Every day | ORAL | Status: DC
  Administered 2022-09-21 – 2022-09-22 (×2): 40 mg via ORAL
  Filled 2022-09-21 (×2): qty 1

## 2022-09-21 MED ORDER — APIXABAN 2.5 MG PO TABS
2.5000 mg | ORAL_TABLET | Freq: Two times a day (BID) | ORAL | Status: DC
  Administered 2022-09-21 – 2022-09-22 (×2): 2.5 mg via ORAL
  Filled 2022-09-21 (×2): qty 1

## 2022-09-21 MED ORDER — MECLIZINE HCL 25 MG PO TABS
25.0000 mg | ORAL_TABLET | Freq: Three times a day (TID) | ORAL | Status: DC | PRN
  Administered 2022-09-21 – 2022-09-22 (×2): 25 mg via ORAL
  Filled 2022-09-21 (×3): qty 1

## 2022-09-21 MED ORDER — LORAZEPAM 2 MG/ML IJ SOLN
INTRAMUSCULAR | Status: AC
  Administered 2022-09-21: 2 mg via INTRAVENOUS
  Filled 2022-09-21: qty 1

## 2022-09-21 MED ORDER — FERROUS SULFATE 325 (65 FE) MG PO TABS: 325.0000 mg | ORAL_TABLET | Freq: Every evening | ORAL | Status: DC

## 2022-09-21 MED ORDER — ACETAMINOPHEN 650 MG RE SUPP: 650.0000 mg | RECTAL | Status: DC | PRN

## 2022-09-21 MED ORDER — STROKE: EARLY STAGES OF RECOVERY BOOK: Freq: Once | Status: AC

## 2022-09-21 MED ORDER — IOHEXOL 350 MG/ML SOLN
100.0000 mL | Freq: Once | INTRAVENOUS | Status: AC | PRN
  Administered 2022-09-21: 100 mL via INTRAVENOUS

## 2022-09-21 MED ORDER — VITAMIN D 25 MCG (1000 UNIT) PO TABS
1000.0000 [IU] | ORAL_TABLET | Freq: Every day | ORAL | Status: DC
  Administered 2022-09-22: 1000 [IU] via ORAL
  Filled 2022-09-21: qty 1

## 2022-09-21 MED ORDER — ALUM & MAG HYDROXIDE-SIMETH 200-200-20 MG/5ML PO SUSP: 15.0000 mL | ORAL | Status: DC | PRN

## 2022-09-21 MED ORDER — ONDANSETRON HCL 4 MG/2ML IJ SOLN: 4.0000 mg | Freq: Three times a day (TID) | INTRAMUSCULAR | Status: DC | PRN

## 2022-09-21 MED ORDER — VENLAFAXINE HCL ER 75 MG PO CP24
150.0000 mg | ORAL_CAPSULE | Freq: Every day | ORAL | Status: DC
  Administered 2022-09-22: 150 mg via ORAL
  Filled 2022-09-21: qty 2

## 2022-09-21 NOTE — Progress Notes (Signed)
OT Cancellation Note  Patient Details Name: Erin Yates MRN: [id number] DOB: [birth year]   Cancelled Treatment:    Reason Eval/Treat Not Completed: Patient not medically ready. Consult received, chart reviewed. Spoke with PT who just evaluated the pt. Per PT, pt very dizzy, nauseated. Difficulty with following commands. Recommending hold OT evaluation at this time to allow for RN/MD to address symptoms. Will re-attempt OT evaluation next date as appropriate.   Ardeth Perfect., MPH, MS, OTR/L ascom [phone] 09/21/22, 3:32 PM

## 2022-09-21 NOTE — ED Notes (Signed)
[place] Dr Earnestine Leys advises Telestroke nurse no longer needed.  Disconnected from cart

## 2022-09-21 NOTE — Code Documentation (Signed)
Stroke Response Nurse Documentation Code Documentation  Erin Yates is a 76 y.o. 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 On Eliquis (apixaban) daily. Code stroke was activated by ED.   Patient from home where she was LKW at 09/20/2022 at 1500 and now complaining of reported near blindness and dizziness. Per primary nurses, patient was last known well at 1500 on 09/20/2022 when she started experiencing dizziness that worsened to a feeling of "swooshiness" per patient, bilateral extremity tremors and all over extremity weakness at 1800. Patient went to bed around 2200, woke up with the same symptoms, and was brought in by husband for evaluation.   Stroke team met patient in CT after Code Stroke activation. Labs drawn and patient cleared for CT by Dr. Stafford. Patient to CT with team. NIHSS 16, see documentation for details and code stroke times. Patient with left gaze preference , bilateral hemianopia, bilateral arm weakness, bilateral leg weakness, right limb ataxia, left decreased sensation, and Sensory  neglect on the left side on exam. The following imaging was completed:  CT Head, CTA, and CTP. Patient is not a candidate for IV Thrombolytic due to outside window. Patient is not a candidate for IR due to no LVO on imaging, per MD.   Care Plan: q2h NIHSS + vital signs.   Bedside handoff with ED RN Bill and Zach.     G   Stroke Response RN   

## 2022-09-21 NOTE — ED Notes (Signed)
Pt placed on purwick with family at bedside.

## 2022-09-21 NOTE — Progress Notes (Signed)
CODE STROKE- PHARMACY COMMUNICATION   Time CODE STROKE called/page received:0813  Time response to CODE STROKE was made (in person): 0819  Time Stroke Kit retrieved from Pyxis: not required  Name of Provider/Nurse contacted:Zach, RN  Past Medical History:  Diagnosis Date   Acute renal failure superimposed on stage 3b chronic kidney disease ([place]) 10/12/2020   Anemia    past  hx   Anxiety    Confusion 05/28/2022   Depression    Diabetes mellitus without complication ([place])    Dysrhythmia    prior to PFO correction   GERD (gastroesophageal reflux disease)    Headache    before PFO closure   Hypercholesteremia    Hypertension    Hypothyroidism    Peripheral vision loss    residual from CVA   PFO (patent foramen ovale)    correction device placed 2010   Right leg numbness    lower leg- residual from CVA   Seasonal allergies    Stroke [facility])    prior to PFO correction   Prior to Admission medications   Medication Sig Start Date End Date Taking? Authorizing Provider  albuterol (VENTOLIN HFA) 108 (90 Base) MCG/ACT inhaler Inhale 2 puffs into the lungs every 6 (six) hours as needed for wheezing or shortness of breath. 10/24/21   Blake Divine, MD  ALPRAZolam Duanne Moron) 0.25 MG tablet Take 1 tablet (0.25 mg total) by mouth 2 (two) times daily as needed for anxiety. 05/31/22   Ezekiel Slocumb, DO  alum & mag hydroxide-simeth (MAALOX/MYLANTA) 200-200-20 MG/5ML suspension Take 15 mLs by mouth every 4 (four) hours as needed for indigestion or heartburn. 05/31/22   Ezekiel Slocumb, DO  apixaban (ELIQUIS) 2.5 MG TABS tablet Take 1 tablet (2.5 mg total) by mouth 2 (two) times daily. 05/31/22   Ezekiel Slocumb, DO  atorvastatin (LIPITOR) 80 MG tablet Take 1 tablet (80 mg total) by mouth daily. 05/31/22   Ezekiel Slocumb, DO  buPROPion (WELLBUTRIN SR) 150 MG 12 hr tablet Take 1 tablet (150 mg total) by mouth 2 (two) times daily. Patient taking differently: Take 150-300 mg by mouth 2 (two)  times daily. Take 300 mg every morning and 150 every evening 03/27/16   Garvin Fila, MD  butalbital-acetaminophen-caffeine [facility]) [phone] MG tablet Take 1 tablet by mouth every 6 (six) hours as needed for headache. 05/31/22   Ezekiel Slocumb, DO  cholecalciferol (VITAMIN D3) 25 MCG (1000 UT) tablet Take 1,000 Units by mouth daily.    [provider]  desvenlafaxine (PRISTIQ) 100 MG 24 hr tablet Take 100 mg by mouth daily. 12/05/19   [provider]  ferrous sulfate 325 (65 FE) MG tablet Take 325 mg by mouth every evening.    [provider]  fluticasone (FLONASE) 50 MCG/ACT nasal spray Place into both nostrils daily as needed for allergies or rhinitis. Patient not taking: Reported on 07/20/2022    [provider]  hydrOXYzine (ATARAX) 25 MG tablet Take 25 mg by mouth 3 (three) times daily as needed. 04/20/22   [provider]  nicotine (NICODERM CQ - DOSED IN MG/24 HOURS) 21 mg/24hr patch Place 1 patch (21 mg total) onto the skin daily. 05/31/22   Ezekiel Slocumb, DO  pantoprazole (PROTONIX) 40 MG tablet Take 40 mg by mouth 2 (two) times daily. Reported on 04/17/2016    [provider]  traZODone (DESYREL) 50 MG tablet Take 50 mg by mouth at bedtime as needed for sleep.  [provider]  vitamin B-12 (CYANOCOBALAMIN) 500 MCG tablet Take 500 mcg by mouth every evening.     [provider]    Dallie Piles ,PharmD Clinical Pharmacist  09/21/2022  8:14 AM

## 2022-09-21 NOTE — Progress Notes (Signed)
[place] ED14 [employer] [facility]) Hospital Liaison note:  This patient is currently enrolled in [facility] outpatient-based Palliative Care. Will continue to follow for disposition.  Please call with any outpatient palliative questions or concerns.  Thank you, Lorelee Market, LPN [facility] Liaison [phone]

## 2022-09-21 NOTE — ED Triage Notes (Signed)
Pt here with a possible stroke. Pt states she went to bed at 2230 last night and was feeling dizzy and "swimmy headed". Pt states when she woke up she felt the same way and could get her legs to work. Pt states she is weaker on her right side.

## 2022-09-21 NOTE — ED Notes (Signed)
0805 Cart activated.  Pt with dizziness with onset at appx 3pm.   0807 Neuro paged [phone] Pt to Ct 0816 Dr Earnestine Leys in room inquiring about location of patient.  [place] to CT to assess pt.

## 2022-09-21 NOTE — Evaluation (Signed)
Clinical/Bedside Swallow Evaluation Patient Details  Name: Erin Yates MRN: [id number] Date of Birth: 07/14/1946  Today's Date: 09/21/2022 Time: SLP Start Time (ACUTE ONLY): 28 SLP Stop Time (ACUTE ONLY): 69 SLP Time Calculation (min) (ACUTE ONLY): 50 min  Past Medical History:  Past Medical History:  Diagnosis Date   Acute renal failure superimposed on stage 3b chronic kidney disease ([place]) 10/12/2020   Anemia    past  hx   Anxiety    Confusion 05/28/2022   Depression    Diabetes mellitus without complication ([place])    Dysrhythmia    prior to PFO correction   GERD (gastroesophageal reflux disease)    Headache    before PFO closure   Hypercholesteremia    Hypertension    Hypothyroidism    Peripheral vision loss    residual from CVA   PFO (patent foramen ovale)    correction device placed 2010   Right leg numbness    lower leg- residual from CVA   Seasonal allergies    Stroke [facility])    prior to PFO correction   Past Surgical History:  Past Surgical History:  Procedure Laterality Date   ABDOMINAL HYSTERECTOMY     BUBBLE STUDY  02/06/2020   Procedure: BUBBLE STUDY;  Surgeon: Sanda Klein, MD;  Location: [place];  Service: Cardiovascular;;   CARDIAC CATHETERIZATION     [facility]   CATARACT EXTRACTION W/PHACO Right 09/01/2015   Procedure: CATARACT EXTRACTION PHACO AND INTRAOCULAR LENS PLACEMENT ([place]);  Surgeon: Leandrew Koyanagi, MD;  Location: [place];  Service: Ophthalmology;  Laterality: Right;  DIABETIC - oral meds   CATARACT EXTRACTION W/PHACO Left 10/20/2015   Procedure: CATARACT EXTRACTION PHACO AND INTRAOCULAR LENS PLACEMENT ([facility]);  Surgeon: Leandrew Koyanagi, MD;  Location: [place];  Service: Ophthalmology;  Laterality: Left;  DIABETIC - oral meds   EP IMPLANTABLE DEVICE N/A 04/04/2016   Procedure: Loop Recorder Insertion;  Surgeon: Deboraha Sprang, MD;  Location: [place] CV [facility];  Service: Cardiovascular;  Laterality: N/A;    FOOT NEUROMA SURGERY Left    IR CT HEAD LTD  10/02/2020   IR PERCUTANEOUS ART THROMBECTOMY/INFUSION INTRACRANIAL INC DIAG ANGIO  10/02/2020   IR US GUIDE VASC ACCESS RIGHT  10/02/2020   NASAL SINUS SURGERY     PATENT FORAMEN OVALE CLOSURE     PATENT FORAMEN OVALE CLOSURE     RADIOLOGY WITH ANESTHESIA N/A 10/02/2020   Procedure: IR WITH ANESTHESIA;  Surgeon: Luanne Bras, MD;  Location: [place];  Service: Radiology;  Laterality: N/A;   TEE WITHOUT CARDIOVERSION N/A 02/09/2016   Procedure: TRANSESOPHAGEAL ECHOCARDIOGRAM (TEE);  Surgeon: Adrian Prows, MD;  Location: [place];  Service: Cardiovascular;  Laterality: N/A;   TEE WITHOUT CARDIOVERSION N/A 11/12/2019   Procedure: TRANSESOPHAGEAL ECHOCARDIOGRAM (TEE);  Surgeon: Minna Merritts, MD;  Location: [facility];  Service: Cardiovascular;  Laterality: N/A;   TEE WITHOUT CARDIOVERSION N/A 02/06/2020   Procedure: TRANSESOPHAGEAL ECHOCARDIOGRAM (TEE);  Surgeon: Sanda Klein, MD;  Location: [facility];  Service: Cardiovascular;  Laterality: N/A;   HPI:  Pt is a 76 y.o. female with a history of prior multiple strokes, hypertension, diabetes, hyperlipidemia, paroxysmal atrial fibrillation on Eliquis who comes to the ED today complaining of dizziness and feeling uncoordinated which started at 3:00 PM yesterday.,  Stent, gradually worsening through the evening.  Generalized weakness overall at this eval per pt and Family present.  Family also endorsed Cognitive-linguistic decline at Baseline.  CT Angio of Head: No emergent large vessel  occlusion.  2. 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  3. 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  No hemorrhage or CT evidence of new infarct.  2. Redemonstrated large areas of encephalomalacia in the bilateral  MCA territories in the left PCA territory, unchanged compared to MRI brain 05/28/2022.    Assessment / Plan / Recommendation  Clinical Impression   Pt seen for BSE. Pt awakened  appropriately, verbal and followed instructions w/ cue. Generalized weakness. 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 Pt assisted in sitting up in bed given verbal cues and support.  On RA; afebrile, wbc wnl.  Pt appears to present w/ adequate oropharyngeal phase swallow function w/ No oropharyngeal phase dysphagia noted, No neuromuscular deficits noted. Pt consumed po trials w/ No immediate, overt, clinical s/s of aspiration during po trials. Pt appears at reduced risk for aspiration following general aspiration precautions.  However, pt does have challenging factors that could impact her oropharyngeal swallowing to include generalized weakness and need for support w/ feeding at meals, vision deficits, and previous CVAs w/ MCI per report. These factors can increase risk for dysphagia, aspiration as well as decreased oral intake overall.  During po trials, pt consumed all consistencies w/ no overt coughing, decline in vocal quality, or change in respiratory presentation during/post trials. O2 sats 98%. Oral phase appeared grossly [facility] w/ timely bolus management, mastication, and control of bolus propulsion for A-P transfer for swallowing. Oral clearing achieved w/ all trial consistencies -- moistened, soft foods given.  OM Exam appeared [facility] w/ no unilateral weakness noted. Speech Clear. Pt fed self w/ setup support.   Recommend a more mech soft consistency diet(for ease of cutting per Family request d/t the vision deficits) w/ well-Cut meats, moistened foods; Thin liquids via CUP. Pt should help to Hold Cup when drinking. Recommend general aspiration precautions, reduce distractions during meals. Support feeding at meals -- more finger foods. Pills WHOLE in Puree for safer, easier swallowing -- pt has done this in the past, and it was encourged now and for D/C to the Dtr.  Education given on Pills in Puree; food consistencies and easy to eat options; general aspiration  precautions to pt and Dtr. NSG to reconsult if any new needs arise. NSG updated, agreed. MD updated. Recommend Dietician f/u for support. SLP Visit Diagnosis: Dysphagia, unspecified (R13.10)    Aspiration Risk   (reduced following general aspiration precautions)    Diet Recommendation   a more mech soft consistency diet(for ease of cutting per Family request d/t the vision deficits) w/ well-Cut meats, moistened foods; Thin liquids via CUP. Pt should help to Hold Cup when drinking. Recommend general aspiration precautions, reduce distractions during meals. Support feeding at meals -- more finger foods.  Medication Administration: Whole meds with puree    Other  Recommendations Recommended Consults:  (Dietician f/u) Oral Care Recommendations: Oral care BID;Oral care before and after PO;Staff/trained caregiver to provide oral care Other Recommendations:  (n/a)    Recommendations for follow up therapy are one component of a multi-disciplinary discharge planning process, led by the attending physician.  Recommendations may be updated based on patient status, additional functional criteria and insurance authorization.  Follow up Recommendations No SLP follow up      Assistance Recommended at Discharge    Functional Status Assessment Patient has not had a recent decline in their functional status  Frequency and Duration  (n/a)   (n/a)  Prognosis Prognosis for Safe Diet Advancement: Fair (-Good) Barriers to Reach Goals: Time post onset;Severity of deficits Barriers/Prognosis Comment: baseline mild Cognitive-communication deficits per Family, chart      Swallow Study   General Date of Onset: 09/21/22 HPI: Pt is a 76 y.o. female with a history of prior multiple strokes, hypertension, diabetes, hyperlipidemia, paroxysmal atrial fibrillation on Eliquis who comes to the ED today complaining of dizziness and feeling uncoordinated which started at 3:00 PM yesterday.,  Stent, gradually  worsening through the evening.  Generalized weakness overall at this eval per pt and Family present.  Family also endorsed Cognitive-linguistic decline at Baseline.  CT Angio of Head: No emergent large vessel occlusion.  2. 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  3. 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  No hemorrhage or CT evidence of new infarct.  2. Redemonstrated large areas of encephalomalacia in the bilateral  MCA territories in the left PCA territory, unchanged compared to MRI brain 05/28/2022. Type of Study: Bedside Swallow Evaluation Previous Swallow Assessment: 11/2019; 09/2020 Diet Prior to this Study: Regular;Thin liquids Temperature Spikes Noted: No (wbc 7.8) Respiratory Status: Room air History of Recent Intubation: No Behavior/Cognition: Alert;Cooperative;Pleasant mood;Distractible;Requires cueing (Family present) Oral Cavity Assessment: Within Functional Limits Oral Care Completed by SLP: Yes Oral Cavity - Dentition: Adequate natural dentition;Missing dentition (few) Vision: Impaired for self-feeding Self-Feeding Abilities: Needs assist;Needs set up;Total assist Patient Positioning: Upright in bed (needed support w/ positioning) Baseline Vocal Quality: Normal;Low vocal intensity (min) Volitional Cough: Strong Volitional Swallow: Able to elicit    Oral/Motor/Sensory Function Overall Oral Motor/Sensory Function: Within functional limits   Ice Chips Ice chips: Within functional limits Presentation: Spoon (fed; 3 trials)   Thin Liquid Thin Liquid: Within functional limits Presentation: Cup;Self Fed (10+ trials)    Nectar Thick Nectar Thick Liquid: Not tested   Honey Thick Honey Thick Liquid: Not tested   Puree Puree: Within functional limits Presentation: Spoon (10 trials)   Solid     Solid: Within functional limits (grossly) Presentation: Self Fed (8 trials)       Orinda Kenner, MS, [place] Speech Language Pathologist Rehab Services;  [place] [phone] (ascom) Kathrin Folden 09/21/2022,3:20 PM

## 2022-09-21 NOTE — ED Notes (Signed)
[id number] Pt returns from CT

## 2022-09-21 NOTE — Progress Notes (Signed)
Code stroke page. Patient was taken to CT, checked in on husband. No additional needs.

## 2022-09-21 NOTE — ED Notes (Signed)
Neurologist at room, sent to [place] #3.

## 2022-09-21 NOTE — ED Notes (Addendum)
First nurse note: Pt arrives POV  of "possibly having a mimi stroke". Husband states mobility issues and went to bed "not right" and woke up "not right".

## 2022-09-21 NOTE — H&P (Signed)
History and Physical    Erin Yates LKG:[id number] DOB: 15-Sep-1946 DOA: 09/21/2022  Referring MD/NP/PA:   PCP: Adin Hector, MD   Patient coming from:  The patient is coming from home.     Chief Complaint: weakness, dizzines  HPI: Erin Yates is a 76 y.o. female with medical history significant of multiple strokes, [name], hypertension, hyperlipidemia, diet-controlled diabetes, GERD, hypothyroidism, depression with anxiety, [name], [name], iron deficiency anemia, tobacco abuse, who presents with weakness and dizziness.  Per her daughter at bedside, pt was LKN at about 9:30 last night when she went to bed. At about 3:00 AM, pt started having dizziness, feeling "wooshy and weak" in her legs and arms. She also has shaking in both lower extremity. She feels weaker and needs assistance to ambulate. Pt does not have chest pain, shortness breath.  Patient has mild dry cough.  No nausea, vomiting, diarrhea or abdominal pain.  No symptoms of UTI.  She took her last dose of Eliquis this morning.    Data reviewed independently and ED Course: pt was found to have WBC 7.8, INR 1.0, PTT 32, UDS positive for barbiturates and benzo, negative urinalysis, stable renal function, blood pressure 142/119, heart rate 76, RR 28, oxygen saturation 98 to 100% on room air.  Patient is placed on telemetry bed for position, Dr. Cheral Marker of neurology is consulted.  CT-head: 1. No hemorrhage or CT evidence of new infarct. 2. Redemonstrated large areas of encephalomalacia in the bilateral MCA territories in the left PCA territory, unchanged compared to MRI brain 05/28/2022.  CTA: 1. No emergent large vessel occlusion. 2. 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 3. 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 4. Aortic Atherosclerosis (ICD10-I70.0) and Emphysema (ICD10-J43.9).   CT perfusion:  1. Evidence of core infarct in the region of the  known prior right MCA territory infarct. Rapid reports a small (7 mL) area of mismatch/penumbra. This could be artifactual or represent small acute on chronic peri-infarct ischemia. An MRI could better assess for acute infarct.    EKG: I have personally reviewed. Sinus rhythm, QTc 474, LAD and poor R progression   Review of Systems:   General: no fevers, chills, no body weight gain, has fatigue HEENT: no blurry vision, hearing changes or sore throat Respiratory: no dyspnea, has coughing, no wheezing CV: no chest pain, no palpitations GI: no nausea, vomiting, abdominal pain, diarrhea, constipation GU: no dysuria, burning on urination, increased urinary frequency, hematuria  Ext: no leg edema Neuro: has weakness and shaking, no vision change or hearing loss Skin: no rash, no skin tear. MSK: No muscle spasm, no deformity, no limitation of range of movement in spin Heme: No easy bruising.  Travel history: No recent long distant travel.   Allergy:  Allergies  Allergen Reactions   Alendronate Other (See Comments)    Rash    Penicillins Rash    Also swelling and itching  Did it involve swelling of the face/tongue/throat, SOB, or low BP? Yes Did it involve sudden or severe rash/hives, skin peeling, or any reaction on the inside of your mouth or nose? Yes Did you need to seek medical attention at a hospital or doctor's office? Yes When did it last happen? More than 15 years ago If all above answers are "NO", may proceed with cephalosporin use.     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   PFO (patent foramen ovale)    correction device placed 2010   Right leg numbness    lower leg- residual from CVA   Seasonal allergies    Stroke [facility])    prior to PFO correction    Past Surgical History:  Procedure Laterality Date   ABDOMINAL HYSTERECTOMY     BUBBLE STUDY  02/06/2020   Procedure: BUBBLE STUDY;  Surgeon: Sanda Klein, MD;  Location: [place];  Service: Cardiovascular;;   CARDIAC CATHETERIZATION     [facility]   CATARACT EXTRACTION W/PHACO Right 09/01/2015   Procedure: CATARACT EXTRACTION PHACO AND INTRAOCULAR LENS PLACEMENT ([place]);  Surgeon: Leandrew Koyanagi, MD;  Location: [place];  Service: Ophthalmology;  Laterality: Right;  DIABETIC - oral meds   CATARACT EXTRACTION W/PHACO Left 10/20/2015   Procedure: CATARACT EXTRACTION PHACO AND INTRAOCULAR LENS PLACEMENT ([facility]);  Surgeon: Leandrew Koyanagi, MD;  Location: [place];  Service: Ophthalmology;  Laterality: Left;  DIABETIC - oral meds   EP IMPLANTABLE DEVICE N/A 04/04/2016   Procedure: Loop Recorder Insertion;  Surgeon: Deboraha Sprang, MD;  Location: [place] CV [facility];  Service: Cardiovascular;  Laterality: N/A;   FOOT NEUROMA SURGERY Left    IR CT HEAD LTD  10/02/2020   IR PERCUTANEOUS ART THROMBECTOMY/INFUSION INTRACRANIAL INC DIAG ANGIO  10/02/2020   IR US GUIDE VASC ACCESS RIGHT  10/02/2020   NASAL SINUS SURGERY     PATENT FORAMEN OVALE CLOSURE     PATENT FORAMEN OVALE CLOSURE     RADIOLOGY WITH ANESTHESIA N/A 10/02/2020   Procedure: IR WITH ANESTHESIA;  Surgeon: Luanne Bras, MD;  Location: [place];  Service: Radiology;  Laterality: N/A;   TEE WITHOUT CARDIOVERSION N/A 02/09/2016   Procedure: TRANSESOPHAGEAL ECHOCARDIOGRAM (TEE);  Surgeon: Adrian Prows, MD;  Location: [place];  Service: Cardiovascular;  Laterality: N/A;   TEE WITHOUT CARDIOVERSION N/A 11/12/2019   Procedure: TRANSESOPHAGEAL ECHOCARDIOGRAM (TEE);  Surgeon: Minna Merritts, MD;   Location: [facility];  Service: Cardiovascular;  Laterality: N/A;   TEE WITHOUT CARDIOVERSION N/A 02/06/2020   Procedure: TRANSESOPHAGEAL ECHOCARDIOGRAM (TEE);  Surgeon: Sanda Klein, MD;  Location: [facility];  Service: Cardiovascular;  Laterality: N/A;    Social History:  reports that she has been smoking cigarettes. She has a 55.00 pack-year smoking history. She has never used smokeless tobacco. She reports that she does not drink alcohol and does not use drugs.  Family History:  Family History  Problem Relation Age of Onset   Dementia Father    Breast cancer Sister 53     Prior to Admission medications   Medication Sig Start Date End Date Taking? Authorizing Provider  albuterol (VENTOLIN HFA) 108 (90 Base) MCG/ACT inhaler Inhale 2 puffs into the lungs every 6 (six) hours as needed for wheezing or shortness of breath. 10/24/21  Yes Blake Divine, MD  ALPRAZolam Duanne Moron) 0.25 MG tablet Take 1 tablet (0.25 mg total) by mouth 2 (two) times daily as needed for anxiety. 05/31/22  Yes Nicole Kindred A, DO  alum & mag hydroxide-simeth (MAALOX/MYLANTA) 200-200-20 MG/5ML suspension Take 15 mLs by mouth every 4 (four) hours as needed for indigestion or heartburn. 05/31/22  Yes Ezekiel Slocumb, DO  apixaban (ELIQUIS) 2.5 MG TABS tablet Take 1 tablet (2.5 mg total) by mouth 2 (two) times daily. 05/31/22  Yes Nicole Kindred A, DO  atorvastatin (LIPITOR) 80 MG tablet Take 1 tablet (80 mg total) by mouth daily. 05/31/22  Yes Nicole Kindred A, DO  buPROPion (WELLBUTRIN SR) 150 MG 12 hr tablet Take 1 tablet (150 mg total) by mouth 2 (two) times daily. Patient taking differently: Take 150-300 mg by mouth 2 (two) times daily. Take 300 mg every morning and 150 every evening 03/27/16  Yes Garvin Fila, MD  butalbital-acetaminophen-caffeine [facility]) [phone] MG tablet Take 1 tablet by mouth every 6 (six) hours as needed for headache. 05/31/22  Yes Nicole Kindred A, DO  cholecalciferol (VITAMIN D3) 25  MCG (1000 UT) tablet Take 1,000 Units by mouth daily.   Yes [provider]  desvenlafaxine (PRISTIQ) 100 MG 24 hr tablet Take 100 mg by mouth daily. 12/05/19  Yes [provider]  ferrous sulfate 325 (65 FE) MG tablet Take 325 mg by mouth every evening.   Yes [provider]  fluticasone (FLONASE) 50 MCG/ACT nasal spray Place into both nostrils daily as needed for allergies or rhinitis.   Yes [provider]  nicotine (NICODERM CQ - DOSED IN MG/24 HOURS) 21 mg/24hr patch Place 1 patch (21 mg total) onto the skin daily. 05/31/22  Yes Nicole Kindred A, DO  pantoprazole (PROTONIX) 40 MG tablet Take 40 mg by mouth 2 (two) times daily. Reported on 04/17/2016   Yes [provider]  traZODone (DESYREL) 50 MG tablet Take 50 mg by mouth at bedtime as needed for sleep.    Yes [provider]  vitamin B-12 (CYANOCOBALAMIN) 500 MCG tablet Take 500 mcg by mouth every evening.    Yes [provider]  hydrOXYzine (ATARAX) 25 MG tablet Take 25 mg by mouth 3 (three) times daily as needed. 04/20/22   [provider]    Physical Exam: Vitals:   09/21/22 1145 09/21/22 1200 09/21/22 1206 09/21/22 1351  BP:  (!) 135/58 (!) 135/58 (!) 109/90  Pulse:  68 68 69  Resp:  17 17 16   Temp:   97.6 F (36.4 C) 98.4 F (36.9 C)  TempSrc:   Oral   SpO2: 94%  93% 98%  Height:    5\' 5"  (1.651 m)   General: Not in acute distress HEENT:       Eyes: PERRL, EOMI, no scleral icterus.       ENT: No discharge from the ears and nose, no pharynx injection, no tonsillar enlargement.        Neck: No JVD, no bruit, no mass felt. Heme: No neck lymph node enlargement. Cardiac: S1/S2, RRR, No murmurs, No gallops or rubs. Respiratory: No rales, wheezing, rhonchi or rubs. GI: Soft, nondistended, nontender, no rebound pain, no organomegaly, BS present. GU: No hematuria Ext: No pitting leg edema bilaterally. 1+DP/PT pulse bilaterally. Musculoskeletal: No joint  deformities, No joint redness or warmth, no limitation of ROM in spin. Skin: No rashes.  Neuro: lethargic, but arousable, when aroused pt is oriented X3, cranial nerves II-XII grossly intact. Muscle strength 4/5 in all extremities, sensation to light touch intact Psych: Patient is not psychotic, no suicidal or hemocidal ideation.  Labs on Admission: I have personally  reviewed following labs and imaging studies  CBC: Recent Labs  Lab 09/21/22 0804  WBC 7.8  NEUTROABS 4.6  HGB 11.4*  HCT 33.6*  MCV 93.9  PLT 540   Basic Metabolic Panel: Recent Labs  Lab 09/21/22 0804  NA 140  K 4.1  CL 106  CO2 25  GLUCOSE 190*  BUN 17  CREATININE 1.64*  CALCIUM 9.3   GFR: CrCl cannot be calculated (Unknown ideal weight.). Liver Function Tests: Recent Labs  Lab 09/21/22 0804  AST 20  ALT 16  ALKPHOS 97  BILITOT 0.4  PROT 7.6  ALBUMIN 3.6   No results for input(s): "LIPASE", "AMYLASE" in the last 168 hours. No results for input(s): "AMMONIA" in the last 168 hours. Coagulation Profile: Recent Labs  Lab 09/21/22 0804  INR 1.0   Cardiac Enzymes: No results for input(s): "CKTOTAL", "CKMB", "CKMBINDEX", "TROPONINI" in the last 168 hours. BNP (last 3 results) No results for input(s): "PROBNP" in the last 8760 hours. HbA1C: No results for input(s): "HGBA1C" in the last 72 hours. CBG: Recent Labs  Lab 09/21/22 0801  GLUCAP 194*   Lipid Profile: No results for input(s): "CHOL", "HDL", "LDLCALC", "TRIG", "CHOLHDL", "LDLDIRECT" in the last 72 hours. Thyroid Function Tests: No results for input(s): "TSH", "T4TOTAL", "FREET4", "T3FREE", "THYROIDAB" in the last 72 hours. Anemia Panel: No results for input(s): "VITAMINB12", "FOLATE", "FERRITIN", "TIBC", "IRON", "RETICCTPCT" in the last 72 hours. Urine analysis:    Component Value Date/Time   COLORURINE STRAW (A) 09/21/2022 1017   APPEARANCEUR CLEAR (A) 09/21/2022 1017   LABSPEC 1.019 09/21/2022 1017   PHURINE 7.0 09/21/2022  1017   GLUCOSEU NEGATIVE 09/21/2022 1017   HGBUR NEGATIVE 09/21/2022 [place] 09/21/2022 1017   KETONESUR NEGATIVE 09/21/2022 1017   PROTEINUR NEGATIVE 09/21/2022 1017   NITRITE NEGATIVE 09/21/2022 1017   LEUKOCYTESUR NEGATIVE 09/21/2022 1017   Sepsis Labs: @LABRCNTIP (procalcitonin:4,lacticidven:4) )No results found for this or any previous visit (from the past 240 hour(s)).   Radiological Exams on Admission: CT ANGIO HEAD NECK W WO CM W PERF (CODE STROKE)  Result Date: 09/21/2022 CLINICAL DATA:  Neuro deficit, acute, stroke suspected EXAM: CT ANGIOGRAPHY HEAD AND NECK CT PERFUSION BRAIN TECHNIQUE: Multidetector CT imaging of the head and neck was performed using the standard protocol during bolus administration of intravenous contrast. Multiplanar CT image reconstructions and MIPs were obtained to evaluate the vascular anatomy. Carotid stenosis measurements (when applicable) are obtained utilizing NASCET criteria, using the distal internal carotid diameter as the denominator. Multiphase CT imaging of the brain was performed following IV bolus contrast injection. Subsequent parametric perfusion maps were calculated using RAPID software. RADIATION DOSE REDUCTION: This exam was performed according to the departmental dose-optimization program which includes automated exposure control, adjustment of the mA and/or kV according to patient size and/or use of iterative reconstruction technique. CONTRAST:  190mL OMNIPAQUE IOHEXOL 350 MG/ML SOLN COMPARISON:  None Available. MRI and MRA 05/29/2022. CTA head/neck February 07, 2016. FINDINGS: CTA NECK FINDINGS Aortic arch: Great vessel origins are patent without significant stenosis. Aortic atherosclerosis. Right carotid system: No evidence of greater than 50% stenosis. Mild stenosis of the right ICA. Mild irregularity of the upper ICA. Left carotid system: No evidence of greater than 50% stenosis. Mild carotid bifurcation and ICA  atherosclerosis. Vertebral arteries: Both vertebral arteries are patent. Moderate stenosis of the left V2 vertebral artery in the mid neck. Skeleton: Multilevel degenerative change. Other neck: No acute findings. Upper chest: Emphysema. Review of the MIP images confirms the  above findings CTA HEAD FINDINGS Anterior circulation: Chronic stenosis of the left A1 ACA. Otherwise, bilateral intracranial ICAs, MCAs, and ACAs are patent without proximal hemodynamically significant stenosis. Posterior circulation: Chronic stenosis of the right P2 PCA. Otherwise, bilateral intradural vertebral arteries, basilar artery and bilateral posterior cerebral arteries are patent without proximal hemodynamically significant stenosis. Venous sinuses: As permitted by contrast timing, patent. CT Brain Perfusion Findings: CBF (<30%) Volume: 76mL Perfusion (Tmax>6.0s) volume: 46mL Mismatch Volume: 15mL Infarction Location:Right MCA territory. IMPRESSION: CTA: 1. No emergent large vessel occlusion. 2. 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 3. 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 4. Aortic Atherosclerosis (ICD10-I70.0) and Emphysema (ICD10-J43.9). CT perfusion: 1. Evidence of core infarct in the region of the known prior right MCA territory infarct. Rapid reports a small (7 mL) area of mismatch/penumbra. This could be artifactual or represent small acute on chronic peri-infarct ischemia. An MRI could better assess for acute infarct. Findings discussed with Dr. Cheral Marker via telephone at 9:14 a.m. Electronically Signed   By: Margaretha Sheffield M.D.   On: 09/21/2022 09:25   CT HEAD CODE STROKE WO CONTRAST  Result Date: 09/21/2022 CLINICAL DATA:  Code stroke.  Dizziness.  Leg weakness. EXAM: CT HEAD WITHOUT CONTRAST TECHNIQUE: Contiguous axial images were obtained from the base of the skull through the vertex without intravenous contrast. RADIATION DOSE REDUCTION: This exam was performed according to the  departmental dose-optimization program which includes automated exposure control, adjustment of the mA and/or kV according to patient size and/or use of iterative reconstruction technique. COMPARISON:  MRI Brain 05/28/22 FINDINGS: Brain: Redemonstrated are large areas of encephalomalacia in the bilateral MCA territories in the left PCA territory, unchanged compared to MRI brain 05/28/2022. there is no CT evidence of a new site of infarct. No hemorrhage. No hydrocephalus. No extra-axial fluid collection. Vascular: No hyperdense vessel or unexpected calcification. Calcification of the carotid siphons bilaterally. Skull: Normal. Negative for fracture or focal lesion. Sinuses/Orbits: No acute finding.  Bilateral lens replacements. Other: None. ASPECTS [facility] Early CT Score) -not applicable given history of prior strokes in the bilateral MCA territories. There are no new hypodensities in this region. IMPRESSION: 1. No hemorrhage or CT evidence of new infarct. 2. Redemonstrated large areas of encephalomalacia in the bilateral MCA territories in the left PCA territory, unchanged compared to MRI brain 05/28/2022. Findings were paged to Dr. Cheral Marker on 09/21/22 at 8:39 AM via [facility] paging system. 2 Electronically Signed   By: Marin Roberts M.D.   On: 09/21/2022 08:39      Assessment/Plan Principal Problem:   Stroke [facility]) Active Problems:   AF (paroxysmal atrial fibrillation) ([place])   Hypertension   Hyperlipidemia LDL goal <70   Tobacco abuse   Type II diabetes mellitus with renal manifestations ([facility])   Stage 3b chronic kidney disease (CKD) ([facility])   Iron deficiency anemia   Depression with anxiety   Assessment and Plan:  Stroke [facility]): CT-perfusion showed evidence of core infarct in the region of the known prior right MCA territory infarct. Rapid reports a small (7 mL) area of mismatch/penumbra. Consulted Dr. Cheral Marker of neuro.    -Placed on tele med bed for observation - will hold oral Bp  meds to allow permissive HTN  - will continue Eliquis per Dr. Cheral Marker - fasting lipid panel and HbA1c  - swallowing screen. If fails, will get SLP - Lipitor - PT/OT consult  AF (paroxysmal atrial fibrillation) ([place]): HR 76 -Eliquis  Hypertension: -  IV hydralazine for SBP>220 or dBP>110  Hyperlipidemia LDL goal <70 -Lipitor  Tobacco abuse -Nicotine patch  Type II diabetes mellitus with renal manifestations ([place]): [name].  Recent A1c 7.0.  Blood sugar 194 today.  Patient is not taking medications currently -Check blood sugar level every morning  Stage 3b chronic kidney disease (CKD) ([place]): Stable.  Baseline creatinine 1.4-1.6.  Her creatinine is 1.64, BUN 17. -Follow-up with BMP  Iron deficiency anemia: Hemoglobin 11.4, stable -Continue iron supplement  Depression with anxiety -Continue home meds    DVT ppx: on Eliquis  Code Status: DNR per pt, her daughter and her husband  Family Communication: Yes, patient's daughter and husband   at bed side.    Disposition Plan:  Anticipate discharge back to previous environment  Consults called:  Dr. Cheral Marker of neuro  Admission status and Level of care: Telemetry Medical:    for obs     Dispo: The patient is from: Home              Anticipated d/c is to: Home              Anticipated d/c date is: 1 day              Patient currently is not medically stable to d/c.    Severity of Illness:  The appropriate patient status for this patient is OBSERVATION. Observation status is judged to be reasonable and necessary in order to provide the required intensity of service to ensure the patient's safety. The patient's presenting symptoms, physical exam findings, and initial radiographic and laboratory data in the context of their medical condition is felt to place them at decreased risk for further clinical deterioration. Furthermore, it is anticipated that the patient will be medically stable for discharge from the  hospital within 2 midnights of admission.        Date of Service 09/21/2022    [facility]   If 7PM-7AM, please contact night-coverage [email or web address] 09/21/2022, 7:21 PM

## 2022-09-21 NOTE — ED Notes (Signed)
CODE  STROKE  CALLED  TO  [facility] PER  DR  Joni Fears  MD

## 2022-09-21 NOTE — ED Provider Notes (Signed)
[facility] Provider Note    Event Date/Time   First MD Initiated Contact with Patient 09/21/22 [id number]     (approximate)   History   Chief Complaint: Cerebrovascular Accident   HPI  Erin Yates is a 76 y.o. female with a history of prior strokes, hypertension, diabetes, hyperlipidemia, paroxysmal atrial fibrillation on Eliquis who comes to the ED today complaining of dizziness and feeling uncoordinated which started at 3:00 PM yesterday.,  Stent, gradually worsening through the evening.  When she woke up this morning she felt that her right side was particularly weak as well.  Denies fall or head trauma.  No fever or neck pain or stiffness.  No chest pain or shortness of breath.     Physical Exam   Triage Vital Signs: ED Triage Vitals [09/21/22 0757]  Enc Vitals Group     BP      Pulse      Resp      Temp      Temp src      SpO2      Weight      Height      Head Circumference      Peak Flow      Pain Score 9     Pain Loc      Pain Edu?      Excl. in [place]?     Most recent vital signs: Vitals:   09/21/22 0930 09/21/22 0945  BP: (!) 130/39 (!) 142/119  Pulse: 69 76  Resp: 19 (!) 28  SpO2: (!) 83% 90%    General: Awake, no distress.  CV:  Good peripheral perfusion.  Regular rate and rhythm Resp:  Normal effort.  Peer to auscultation bilaterally Abd:  No distention.  Soft nontender Other:  Neuro exam significant for bilateral upper extremity dysmetria.  Patient notes "I cannot find my nose."  Also right arm weakness.  Bilateral lower extremity weakness with hip flexion, unable to maintain lifted position against gravity . NIH Stroke Scale: 7     ED Results / Procedures / Treatments   Labs (all labs ordered are listed, but only abnormal results are displayed) Labs Reviewed  COMPREHENSIVE METABOLIC PANEL - Abnormal; Notable for the following components:      Result Value   Glucose, Bld 190 (*)    Creatinine, Ser 1.64 (*)    GFR,  Estimated 32 (*)    All other components within normal limits  URINALYSIS, ROUTINE W REFLEX MICROSCOPIC - Abnormal; Notable for the following components:   Color, Urine STRAW (*)    APPearance CLEAR (*)    All other components within normal limits  CBC WITH DIFFERENTIAL/PLATELET - Abnormal; Notable for the following components:   RBC 3.58 (*)    Hemoglobin 11.4 (*)    HCT 33.6 (*)    All other components within normal limits  CBG MONITORING, ED - Abnormal; Notable for the following components:   Glucose-Capillary 194 (*)    All other components within normal limits  ETHANOL  APTT  PROTIME-INR  URINE DRUG SCREEN, QUALITATIVE ([facility] ONLY)     EKG Interpreted by me Sinus rhythm rate of 71.  Normal axis, mild first-degree AV block.  Poor R wave progression.  Normal ST segments and T waves.   RADIOLOGY CT head interpreted by me, appears unremarkable.  Radiology report reviewed.  CT angiogram head and neck shows small area of acute on chronic infarction   PROCEDURES:  Procedures  MEDICATIONS ORDERED IN ED: Medications  iohexol (OMNIPAQUE) 350 MG/ML injection 100 mL (100 mLs Intravenous Contrast Given 09/21/22 0840)  LORazepam (ATIVAN) injection 2 mg (2 mg Intravenous Given 09/21/22 0855)     IMPRESSION / MDM / ASSESSMENT AND PLAN / ED COURSE  I reviewed the triage vital signs and the nursing notes.                   NIH Stroke Scale: 7           Differential diagnosis includes, but is not limited to, ischemic stroke, electrolyte abnormality, dehydration, UTI, viral illness, physiologic stress with recrudescence of prior stroke symptoms  Patient's presentation is most consistent with acute presentation with potential threat to life or bodily function.  Patient presents with strokelike symptoms, however distribution is difficult to localize anatomically.  Last known well 3:00 PM yesterday.  Not a candidate for thrombolysis, but within a 24-hour endovascular intervention  window.  Code stroke initiated for stat CT head and neurology evaluation.  ----------------------------------------- 10:45 AM on 09/21/2022 ----------------------------------------- Imaging confirms a small area of acute infarction.  Not a candidate for any intervention.  Discussed with neurology who recommends continuing anticoagulation, admission for further stroke management, PT/OT/speech evaluation.       FINAL CLINICAL IMPRESSION(S) / ED DIAGNOSES   Final diagnoses:  Acute ischemic stroke ([place])     Rx / DC Orders   ED Discharge Orders     None        Note:  This document was prepared using Dragon voice recognition software and may include unintentional dictation errors.   Carrie Mew, MD 09/21/22 1045

## 2022-09-21 NOTE — Consult Note (Addendum)
NEURO HOSPITALIST CONSULT NOTE   Requestig physician: Dr. Joni Fears  Reason for Consult: Acute onset BLE tremors, BLE weakness and vertigo  History obtained from:  Patient and Chart     HPI:                                                                                                                                          Erin Yates is a 76 y.o. female smoker with recent admission in July for acute left PCA stroke (at that time she presented with ataxia and worsened confusion with near blindness superimposed on her residual peripheral visual deficits from prior strokes), a PMHx of atrial fibrillation on Eliquis, multiple strokes ("about 9" per patient) with residual deficits of peripheral vision loss and RLE weakness, prior cataract extractions, CKD3, anemia, anxiety, depression, DM, PFO s/p repair in 2010, hypercholesterolemia, HTN and hypothyroidism who presents with acute onset of vertigo, BLE weakness with BLE tremor requiring assistance to ambulate and truncal instability while sitting. LKN 1500 with dizziness at symptom onset, followed by feeling "swooshy and weak" in her legs and arms. She went to bed at 10:30 PM with her husband and on awakening felt the same symptoms. Husband was worried, so he drove her to the [facility] ED where he brought her in with a wheelchair. In Triage the patient stated that when she woke up she felt that she could not get her legs to work and also stated that she felt weaker on her right side. Code Stroke was called in Triage.   Preliminary review of STAT CT reveals multiple chronic ischemic infarctions including bilateral MCA territories and left PCA territory, with no hemorrhage.   HPI from her July admission has been reviewed: "Erin Yates is a 76 yo female with PMH CVA (residual vision deficits, concentration/focus/memory deficits, weakness), PFO (s/p closure with Amplatzer PFO device performed July 2010 by Dr. Aline Brochure, [facility]), DMII, HLD, HTN,  hypothyroidism, PAF, depression/anxiety who presents with complaints of ataxia, dizziness, worsening confusion.  She is accompanied by her husband and daughter in the ER.  She has been progressively worsening over the past several days at home.  Her husband has tried giving her some Dramamine for dizziness with no relief.  She does take Dramamine occasionally approximately every couple weeks if feeling dizzy.  Her daughter states that she has appeared to become acutely worse in general but notably due to her confusion and memory impairment. On evaluation in the ER she was very anxious appearing and continuously asking for cigarettes.  She was fixated on wanting to go home in order to be able to smoke her cigarettes. Of note, she gets severe anxiety in the past undergoing MRIs and does not always complete them adequately. She was last hospitalized 10/02/20 to 10/12/20  after an acute right MCA stroke that was considered embolic due to A-fib despite anticoagulation.  She underwent emergent thrombectomy at that time after CTA revealed proximal left M2 occlusion.  She was continued on Eliquis at discharge after hospitalization. She was evaluated by cardiology after discharge for consideration of Watchman device.  She met with Dr. Quentin Ore December 2021 and she was not a candidate for Watchman due to interference of the Amplatzer plug of the interatrial septum. She was recommended to continue on anticoagulation. She is now admitted for further stroke work-up due to her symptoms as noted. On evaluating patient, she was complaining of being almost blind.  At baseline she has very poor tunnel vision with no peripheral vision from her prior strokes.  She had a very difficult time even seeing one finger a few inches in front of her eyes while both open.  She essentially could not see a finger when either eye was closed.  Despite this, she has continued to still smoke at home.  Also, as noted she is also described to have a very  worsening gait but was not described to be leaning towards any specific direction.  Her husband states she has been holding onto the walls to walk over the past couple days.  He also states that she cannot get her fork to her mouth when trying to eat as well.  Patient is right-handed."  mRS: 3 Initial NIHSS: 21  Past Medical History:  Diagnosis Date   Acute renal failure superimposed on stage 3b chronic kidney disease ([place]) 10/12/2020   Anemia    past  hx   Anxiety    Confusion 05/28/2022   Depression    Diabetes mellitus without complication ([place])    Dysrhythmia    prior to PFO correction   GERD (gastroesophageal reflux disease)    Headache    before PFO closure   Hypercholesteremia    Hypertension    Hypothyroidism    Peripheral vision loss    residual from CVA   PFO (patent foramen ovale)    correction device placed 2010   Right leg numbness    lower leg- residual from CVA   Seasonal allergies    Stroke [facility])    prior to PFO correction    Past Surgical History:  Procedure Laterality Date   ABDOMINAL HYSTERECTOMY     BUBBLE STUDY  02/06/2020   Procedure: BUBBLE STUDY;  Surgeon: Sanda Klein, MD;  Location: [place];  Service: Cardiovascular;;   CARDIAC CATHETERIZATION     [facility]   CATARACT EXTRACTION W/PHACO Right 09/01/2015   Procedure: CATARACT EXTRACTION PHACO AND INTRAOCULAR LENS PLACEMENT ([place]);  Surgeon: Leandrew Koyanagi, MD;  Location: [place];  Service: Ophthalmology;  Laterality: Right;  DIABETIC - oral meds   CATARACT EXTRACTION W/PHACO Left 10/20/2015   Procedure: CATARACT EXTRACTION PHACO AND INTRAOCULAR LENS PLACEMENT ([facility]);  Surgeon: Leandrew Koyanagi, MD;  Location: [place];  Service: Ophthalmology;  Laterality: Left;  DIABETIC - oral meds   EP IMPLANTABLE DEVICE N/A 04/04/2016   Procedure: Loop Recorder Insertion;  Surgeon: Deboraha Sprang, MD;  Location: [place] CV LAB;  Service: Cardiovascular;  Laterality: N/A;    FOOT NEUROMA SURGERY Left    IR CT HEAD LTD  10/02/2020   IR PERCUTANEOUS ART THROMBECTOMY/INFUSION INTRACRANIAL INC DIAG ANGIO  10/02/2020   IR US GUIDE VASC ACCESS RIGHT  10/02/2020   NASAL SINUS SURGERY     PATENT FORAMEN OVALE CLOSURE     PATENT  FORAMEN OVALE CLOSURE     RADIOLOGY WITH ANESTHESIA N/A 10/02/2020   Procedure: IR WITH ANESTHESIA;  Surgeon: Luanne Bras, MD;  Location: [place];  Service: Radiology;  Laterality: N/A;   TEE WITHOUT CARDIOVERSION N/A 02/09/2016   Procedure: TRANSESOPHAGEAL ECHOCARDIOGRAM (TEE);  Surgeon: Adrian Prows, MD;  Location: [place];  Service: Cardiovascular;  Laterality: N/A;   TEE WITHOUT CARDIOVERSION N/A 11/12/2019   Procedure: TRANSESOPHAGEAL ECHOCARDIOGRAM (TEE);  Surgeon: Minna Merritts, MD;  Location: [facility];  Service: Cardiovascular;  Laterality: N/A;   TEE WITHOUT CARDIOVERSION N/A 02/06/2020   Procedure: TRANSESOPHAGEAL ECHOCARDIOGRAM (TEE);  Surgeon: Sanda Klein, MD;  Location: [facility] END[facility]COPY;  Service: Cardiovascular;  Laterality: N/A;    Family History  Problem Relation Age of Onset   Dementia Father    Breast cancer Sister 80              Social History:  reports that she has been smoking cigarettes. She has a 55.00 pack-year smoking history. She has never used smokeless tobacco. She reports that she does not drink alcohol and does not use drugs.  Allergies  Allergen Reactions   Alendronate Other (See Comments)    Rash    Penicillins Rash    Also swelling and itching  Did it involve swelling of the face/tongue/throat, SOB, or low BP? Yes Did it involve sudden or severe rash/hives, skin peeling, or any reaction on the inside of your mouth or nose? Yes Did you need to seek medical attention at a hospital or doctor's office? Yes When did it last happen? More than 15 years ago If all above answers are "NO", may proceed with cephalosporin use.     MEDICATIONS:                                                                                                                       No current facility-administered medications on file prior to encounter.   Current Outpatient Medications on File Prior to Encounter  Medication Sig Dispense Refill   albuterol (VENTOLIN HFA) 108 (90 Base) MCG/ACT inhaler Inhale 2 puffs into the lungs every 6 (six) hours as needed for wheezing or shortness of breath. 8 g 0   ALPRAZolam (XANAX) 0.25 MG tablet Take 1 tablet (0.25 mg total) by mouth 2 (two) times daily as needed for anxiety. 30 tablet 0   alum & mag hydroxide-simeth (MAALOX/MYLANTA) 200-200-20 MG/5ML suspension Take 15 mLs by mouth every 4 (four) hours as needed for indigestion or heartburn. 355 mL 0   apixaban (ELIQUIS) 2.5 MG TABS tablet Take 1 tablet (2.5 mg total) by mouth 2 (two) times daily. 60 tablet 5   atorvastatin (LIPITOR) 80 MG tablet Take 1 tablet (80 mg total) by mouth daily. 30 tablet 5   buPROPion (WELLBUTRIN SR) 150 MG 12 hr tablet Take 1 tablet (150 mg total) by mouth 2 (two) times daily. (Patient taking differently: Take 150-300 mg by mouth 2 (two) times daily. Take 300 mg every morning and 150  every evening) 60 tablet 5   butalbital-acetaminophen-caffeine (FIORICET) 50-325-40 MG tablet Take 1 tablet by mouth every 6 (six) hours as needed for headache. 14 tablet 0   cholecalciferol (VITAMIN D3) 25 MCG (1000 UT) tablet Take 1,000 Units by mouth daily.     desvenlafaxine (PRISTIQ) 100 MG 24 hr tablet Take 100 mg by mouth daily.     ferrous sulfate 325 (65 FE) MG tablet Take 325 mg by mouth every evening.     fluticasone (FLONASE) 50 MCG/ACT nasal spray Place into both nostrils daily as needed for allergies or rhinitis.     nicotine (NICODERM CQ - DOSED IN MG/24 HOURS) 21 mg/24hr patch Place 1 patch (21 mg total) onto the skin daily. 28 patch 0   pantoprazole (PROTONIX) 40 MG tablet Take 40 mg by mouth 2 (two) times daily. Reported on 04/17/2016     traZODone (DESYREL) 50 MG tablet Take 50 mg by mouth at bedtime as  needed for sleep.      vitamin B-12 (CYANOCOBALAMIN) 500 MCG tablet Take 500 mcg by mouth every evening.      hydrOXYzine (ATARAX) 25 MG tablet Take 25 mg by mouth 3 (three) times daily as needed.       ROS:                                                                                                                                       Severe 9/10 nonthrobbing headache to frontal and occipital regions. RUE tremor. Does not endorse any other symptoms except as noted in HPI. Detailed ROS deferred due to acuity of presentation.    Blood pressure 129/60, pulse 65, resp. rate 15, SpO2 100 %.   General Examination:                                                                                                       Physical Exam  HEENT-  [place]/AT   Lungs- Respirations unlabored Extremities- Warm and well perfused   Neurological Examination Mental Status: Awake and alert with anxious, labile affect. Speech is fluent with intact comprehension. Has difficulty with finger naming but can name her nose, her ear and her foot. Cannot visually identify objects but will identify by touch. Oriented to the city, but not the state. Not oriented to the day or month.  Cranial Nerves: II: Will track examiner's face and make eye contact, but states that she cannot see if examiner's hand is in front of his face or  not. Cannot identify objects visually. Cannot count fingers held in front of her. Was able to track examiner's fingers from side to side. PERRL.  III,IV, VI: EOMI with saccadic pursuits and hesitancy on leftward gaze V: Intact to FT and cool temperature bilaterally VII: Smile symmetric VIII: Hearing intact to voice IX,X: No hypophonia or hoarseness XI: Head is midline XII: Midline tongue extension Motor: RUE with intermittent coarse tremor involving forearm and hand. Maximum grip 4/5, biceps 4+/5, triceps 4/5, deltoid 4/5 LUE 4+/5 proximally and distally RLE 4/5 LLE 4/5 Sensory: Unreliable  responses to touch in LUE and LLE with intact FT to RUE and RLE Deep Tendon Reflexes: 2+ bilateral brachioradialis and biceps, 3+ bilateral patellae  Plantars: Right: downgoing   Left: upgoing Cerebellar: Erratic hand movements bilaterally, worse on the right due to intermittent coarse tremor, without definite ataxia.  Gait: Unable to assess     Lab Results: Basic Metabolic Panel: No results for input(s): "NA", "K", "CL", "CO2", "GLUCOSE", "BUN", "CREATININE", "CALCIUM", "MG", "PHOS" in the last 168 hours.  CBC: Recent Labs  Lab 09/21/22 0804  WBC 7.8  NEUTROABS 4.6  HGB 11.4*  HCT 33.6*  MCV 93.9  PLT 275    Cardiac Enzymes: No results for input(s): "CKTOTAL", "CKMB", "CKMBINDEX", "TROPONINI" in the last 168 hours.  Lipid Panel: No results for input(s): "CHOL", "TRIG", "HDL", "CHOLHDL", "VLDL", "LDLCALC" in the last 168 hours.  Imaging: No results found.   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  - CKD. eGFR is 32, which is within the range allowing for CTA.  - Imaging:  - CT head: She has multiple old medium to large sized cortically-based strokes on CT head that most likely are contributing to the deficits noted above. No hemorrhage seen. The old strokes are as follows: Large areas of encephalomalacia in the bilateral MCA territories and in the left PCA territory, unchanged compared to MRI brain 05/28/2022. There is no CT evidence of a new site of infarct.  - CTA of head and neck: No emergent large vessel occlusion. 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 Aortic Atherosclerosis   - CT perfusion:  Evidence of core infarct in the region of the known prior right MCA territory infarct. Rapid reports a small (7 mL) area of mismatch/penumbra. This could be artifactual or represent small acute on chronic peri-infarct ischemia, per Radiology report. On personal review of the images by Neurology, the apparent perfusion deficit is entirely contained within the region of chronic infarction and is therefore most likely artifactual.   - TTE 05/28/22: LVEF 60 to 65%. The left ventricle has no regional wall motion abnormalities. There is mild left ventricular hypertrophy. Left atrial size was mildly dilated. No mural thrombus or valvular vegetation mentioned in the report.  - UDS: Positive for barbiturate and benzodiazepine - U/A: Normal.  - Overall impression: Presenting symptoms do not militate in favor of stroke. Imaging in the context of artifact also shows no acute stroke. No LVO and therefore not an interventional candidate. Most likely etiology for presentation is anxiety attack triggered by peripheral vertigo.  Recommendations: - Patient states that she is unable to tolerate MRI  without anesthesia. Will hold off on MRI.  - Cardiac telemetry - PT/OT/Speech - Continue Eliquis - BP management per standard protocol - Consider ENT consult for peripheral vertigo   Electronically signed: Dr. Kerney Elbe 09/21/2022, 8:19 AM

## 2022-09-21 NOTE — Evaluation (Signed)
Physical Therapy Evaluation Patient Details Name: Erin Yates MRN: [id number] DOB: 03-Mar-1946 Today's Date: 09/21/2022  History of Present Illness  Pt is a 76 y.o. female presenting to hospital 11/16 with c/o dizziness and feeling uncoordinated yesterday; woke up today feeling like R side weak as well.  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  PMH includes strokes (peripheral vision loss and R sided weakness residual deficits), htn, DM, HLD, paroxysmal [name], anxiety, PFO s/p repair 2010.  Clinical Impression  Prior to hospital admission, pt was ambulatory ("furniture cruised" within home; intermittent assist for walking as needed; husband assisting pt with ADL's/IADL's); lives with her husband on main level of home with ramp to enter.  H/o visual impairments from strokes (family reports pt has tunnel vision but can't see if objects too close to her face; pt can't see up/down or side to side but can see L lower visual field per pt's daughter).  Pt able to state name and DOB with increased time today (pt normally A&Ox3--doesn't always know date/time but can look up via watch or calendar).  B UE and B LE weakness noted but pt inconsistent with following cues; pt also requiring increased time to follow cues during session.  Pt reporting 8/10 frontal and posterior headache (nurse notified for pain medication and brought medication during session).  Pt also reporting 7-8/10 dizziness resting in bed and with activity--nurse notified and reported she would check with MD's regarding possible medication for dizziness.  Currently pt  is CGA to min assist with bed mobility and CGA to min assist for sitting balance (pt swaying in sitting d/t dizziness so deferred further activity).  Pt would benefit from skilled PT to address noted impairments and functional limitations (see below for any additional details).  Upon hospital discharge, anticipate pt would benefit from SNF but will continue to assess pt's status and update recommendations as appropriate.    Recommendations for follow up therapy are one component of a multi-disciplinary discharge planning process, led by the attending physician.  Recommendations may be updated based on patient status, additional functional criteria and insurance authorization.  Follow Up Recommendations Skilled nursing-short term rehab (<3 hours/day) Can patient physically be transported by private vehicle: No    Assistance Recommended at Discharge Frequent or constant Supervision/Assistance  Patient can return home with the following  A lot of help with walking and/or transfers;A lot of help with bathing/dressing/bathroom;Assistance with cooking/housework;Direct supervision/assist for medications management;Assist for transportation;Help with stairs or ramp for entrance    Equipment Recommendations Wheelchair (measurements PT);Wheelchair cushion (measurements PT);Rolling walker (2 wheels);Hospital bed;BSC/3in1  Recommendations for Other Services  OT consult    Functional Status Assessment Patient has had a recent decline in their functional status and demonstrates the ability to make significant improvements in function in a reasonable and predictable amount of time.     Precautions / Restrictions Precautions Precautions: Fall Precaution Comments: aspiration Restrictions Weight Bearing Restrictions: No      Mobility  Bed Mobility Overal bed mobility: Needs Assistance Bed Mobility: Supine to Sit, Sit to Supine     Supine to sit: Min guard, HOB elevated Sit to supine: Min assist,  HOB  elevated   General bed mobility comments: mild increased effort to sit on edge of bed (CGA for safety); min assist to lay back down in bed (pt swaying in sitting d/t dizziness); 2 assist to boost pt up in bed using bed pad    Transfers                   General transfer comment: Deferred d/t pt swaying in sitting d/t dizziness    Ambulation/Gait                  Stairs            Wheelchair Mobility    Modified Rankin (Stroke Patients Only)       Balance Overall balance assessment: Needs assistance Sitting-balance support: Bilateral upper extremity supported, Feet unsupported Sitting balance-Leahy Scale: Poor Sitting balance - Comments: pt swaying in sitting (d/t dizziness) requiring CGA to min assist for balance                                     Pertinent Vitals/Pain Pain Assessment Pain Assessment: 0-10 Pain Score: 8  Pain Location: frontal and posterior headache Pain Descriptors / Indicators: Headache Pain Intervention(s): Limited activity within patient's tolerance, Monitored during session, Repositioned, Patient requesting pain meds-RN notified, RN gave pain meds during session HR WFL during sessions activities.    Home Living Family/patient expects to be discharged to:: Private residence Living Arrangements: Spouse/significant other (and dog) Available Help at Discharge: Family;Available PRN/intermittently Type of Home: House Home Access: Ramped entrance       Home Layout: Two level;Able to live on main level with bedroom/bathroom Home Equipment: Rolling Walker (2 wheels);Cane - single point;Other (comment);Shower seat (3ww)      Prior Function Prior Level of Function : Needs assist             Mobility Comments: "Furniture cruises" within home walking (has 3ww if needed); intermittent assist for ambulation; no recent falls in past 6 months. ADLs Comments: Assist for bathing, dressing, meals, medications, cleaning  (from husband).     Hand Dominance   Dominant Hand: Right    Extremity/Trunk Assessment   Upper Extremity Assessment Upper Extremity Assessment: Defer to OT evaluation;Difficult to assess due to impaired cognition (L UE appearing weaker than R UE but difficult to assess d/t pt's difficulty following cues)    Lower Extremity Assessment Lower Extremity Assessment: RLE deficits/detail;LLE deficits/detail;Difficult to assess due to impaired cognition RLE Deficits / Details: at least 2/5 hip flexion and knee flexion/extension; at least 3/5 AROM DF/PF (difficult to assess d/t impaired cognition) LLE Deficits / Details: at least 3/5 hip flexion; at least 3-/5 knee flexion/extension; and at least 3/5 AROM DF/PF AROM (difficult to assess d/t impaired cognition)       Communication   Communication: No difficulties  Cognition Arousal/Alertness: Awake/alert Behavior During Therapy:  (increased time to respond) Overall Cognitive Status: Impaired/Different from baseline                                 General Comments: Pt fluctuates with mental status (today one of the more challenging days); oriented to person (pt normally oriented to at least x3--sometimes not date/time); increased time to respond to cues and questions.        General Comments  Nursing cleared pt for participation in  physical therapy.  Pt agreeable to PT session.  Pt's daughter, son, and husband present during session.    Exercises     Assessment/Plan    PT Assessment Patient needs continued PT services  PT Problem List Decreased strength;Decreased activity tolerance;Decreased balance;Decreased mobility;Decreased cognition;Decreased knowledge of use of DME;Decreased safety awareness;Decreased knowledge of precautions;Pain       PT Treatment Interventions DME instruction;Gait training;Functional mobility training;Therapeutic activities;Therapeutic exercise;Balance training;Patient/family education    PT  Goals (Current goals can be found in the Care Plan section)  Acute Rehab PT Goals Patient Stated Goal: to improve mobility PT Goal Formulation: With patient/family Time For Goal Achievement: 10/05/22 Potential to Achieve Goals: Good    Frequency Min 2X/week     Co-evaluation               AM-PAC PT "6 Clicks" Mobility  Outcome Measure Help needed turning from your back to your side while in a flat bed without using bedrails?: None Help needed moving from lying on your back to sitting on the side of a flat bed without using bedrails?: A Little Help needed moving to and from a bed to a chair (including a wheelchair)?: A Little Help needed standing up from a chair using your arms (e.g., wheelchair or bedside chair)?: A Little Help needed to walk in hospital room?: Total Help needed climbing 3-5 steps with a railing? : Total 6 Click Score: 15    End of Session   Activity Tolerance: Other (comment) (limited d/t headache and dizziness) Patient left: in bed;with call bell/phone within reach;with bed alarm set;with family/visitor present Nurse Communication: Mobility status;Precautions;Other (comment) (pt's headache and dizziness) PT Visit Diagnosis: Unsteadiness on feet (R26.81);Other abnormalities of gait and mobility (R26.89);Muscle weakness (generalized) (M62.81);Dizziness and giddiness (R42)    Time: 3790-2409 PT Time Calculation (min) (ACUTE ONLY): 32 min   Charges:   PT Evaluation $PT Eval Low Complexity: 1 Low         Neithan Day, PT 09/21/22, 3:56 PM

## 2022-09-21 NOTE — ED Notes (Signed)
Pt with anxiety while in CT scanner, states that she can not tolerate being in there. Lorazepam 2mg  iv ordered and given.

## 2022-09-22 DIAGNOSIS — H81399 Other peripheral vertigo, unspecified ear: Secondary | ICD-10-CM | POA: Diagnosis present

## 2022-09-22 DIAGNOSIS — N1832 Chronic kidney disease, stage 3b: Secondary | ICD-10-CM | POA: Diagnosis not present

## 2022-09-22 DIAGNOSIS — Z8673 Personal history of transient ischemic attack (TIA), and cerebral infarction without residual deficits: Secondary | ICD-10-CM

## 2022-09-22 DIAGNOSIS — E1122 Type 2 diabetes mellitus with diabetic chronic kidney disease: Secondary | ICD-10-CM

## 2022-09-22 DIAGNOSIS — I480 Paroxysmal atrial fibrillation: Secondary | ICD-10-CM | POA: Diagnosis not present

## 2022-09-22 LAB — LIPID PANEL
Cholesterol: 200 mg/dL (ref 0–200)
HDL: 39 mg/dL — ABNORMAL LOW (ref >40)
LDL Cholesterol: 102 mg/dL — ABNORMAL HIGH (ref 0–99)
Total CHOL/HDL Ratio: 5.1 RATIO
Triglycerides: 293 mg/dL — ABNORMAL HIGH (ref <150)
VLDL: 59 mg/dL — ABNORMAL HIGH (ref 0–40)

## 2022-09-22 MED ORDER — MECLIZINE HCL 25 MG PO TABS: 25.0000 mg | ORAL_TABLET | Freq: Three times a day (TID) | ORAL | 0 refills | Status: AC | PRN

## 2022-09-22 MED ORDER — BUTALBITAL-APAP-CAFFEINE 50-325-40 MG PO TABS: 1.0000 | ORAL_TABLET | Freq: Four times a day (QID) | ORAL | 0 refills | Status: AC | PRN

## 2022-09-22 MED ORDER — NICOTINE 21 MG/24HR TD PT24
21.0000 mg | MEDICATED_PATCH | Freq: Every day | TRANSDERMAL | Status: DC
  Administered 2022-09-22: 21 mg via TRANSDERMAL
  Filled 2022-09-22: qty 1

## 2022-09-22 NOTE — Progress Notes (Signed)
Physical Therapy Treatment Patient Details Name: Erin Yates MRN: [id number] DOB: 1945/12/30 Today's Date: 09/22/2022   History of Present Illness Pt is a 76 y.o. female presenting to hospital 11/16 with c/o dizziness and feeling uncoordinated yesterday; woke up today feeling like R side weak as well.  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  PMH includes strokes (peripheral vision loss and R sided weakness residual deficits), htn, DM, HLD, paroxysmal [name], anxiety, PFO s/p repair 2010.    PT Comments    PT received message from nursing that pt requesting to get OOB so therapist came to assist pt with mobility; pt's daughter and pt's husband present during session.  Pt reporting moderate headache (frontal, superior, posterior) but received recent pain medication.  Pt also reporting dizziness (at rest and with activity) but unable to quantify.  Pt oriented to person and place.  During session pt CGA semi-supine to sitting edge of bed; CGA to min assist sitting edge of bed (pt with mild postural sway and 1 loss of balance d/t dizziness in sitting); min assist with transfers using RW; and CGA to min assist to ambulate a few feet bed to recliner with RW use (intermittent assist for balance d/t dizziness).  Pt noted with difficulty holding her head in place intermittently during session (feeling heavy and d/t dizziness).  Discussed with nursing regarding pt's dizziness and nursing then gave pt meclizine for dizziness before planned OT session this morning.  Pt reclined in recliner with daughter next to pt and husband present  end of session.  Will continue to focus on strengthening, balance, and progressive functional mobility during hospitalization.    Recommendations for follow up therapy are one component of a multi-disciplinary discharge planning process, led by the attending physician.  Recommendations may be updated based on patient status, additional functional criteria and insurance authorization.  Follow Up Recommendations  Skilled nursing-short term rehab (<3 hours/day) Can patient physically be transported by private vehicle: No   Assistance Recommended at Discharge Frequent or constant Supervision/Assistance  Patient can return home with the following A lot of help with walking and/or transfers;A lot of help with bathing/dressing/bathroom;Assistance with cooking/housework;Direct supervision/assist for medications management;Assist for transportation;Help with stairs or ramp for entrance   Equipment Recommendations  Wheelchair (measurements PT);Wheelchair cushion (measurements PT);Rolling walker (2 wheels);Hospital bed;BSC/3in1    Recommendations for Other Services OT consult     Precautions / Restrictions Precautions Precautions: Fall Precaution Comments: aspiration Restrictions Weight Bearing Restrictions: No     Mobility  Bed Mobility Overal bed mobility: Needs Assistance Bed Mobility: Supine to Sit     Supine to sit: Min guard, HOB elevated     General bed mobility comments: mild increased effort to sit on edge of bed (CGA for safety)    Transfers Overall transfer level: Needs assistance Equipment used: Rolling walker (2 wheels) Transfers: Sit to/from Stand Sit to Stand: Min assist           General transfer comment: vc's for UE/LE placement; assist to steady    Ambulation/Gait Ambulation/Gait assistance: Min guard, Min assist Gait Distance (Feet): 3 Feet (bed to recliner) Assistive device: Rolling walker (2  wheels)   Gait velocity: decreased     General Gait  Details: decreased B LE step length/foot clearance/heelstrike; intermittent assist to steady   Chief Strategy Officer    Modified Rankin (Stroke Patients Only)       Balance Overall balance assessment: Needs assistance Sitting-balance support: Bilateral upper extremity supported, Feet supported Sitting balance-Leahy Scale: Poor Sitting balance - Comments: pt with mild sway in sitting (CGA for safety) but 1 loss of balance requiring min assist to correct   Standing balance support: Bilateral upper extremity supported, During functional activity Standing balance-Leahy Scale: Poor Standing balance comment: intermittent min assist for balance d/t sway                            Cognition Arousal/Alertness: Awake/alert Behavior During Therapy:  (increased time to respond at times) Overall Cognitive Status: Impaired/Different from baseline                                 General Comments: Oriented to person and place only.        Exercises      General Comments  Nursing cleared pt for participation in physical therapy.  Pt agreeable to PT session.      Pertinent Vitals/Pain Pain Assessment Pain Assessment: Faces Faces Pain Scale: Hurts even more Pain Location: frontal, superior, and posterior headache Pain Descriptors / Indicators: Headache Pain Intervention(s): Limited activity within patient's tolerance, Monitored during session, Premedicated before session, Repositioned Vitals (HR and O2 on room air) stable and WFL throughout treatment session.    Home Living                          Prior Function            PT Goals (current goals can now be found in the care plan section) Acute Rehab PT Goals Patient Stated Goal: to improve mobility PT Goal Formulation: With patient/family Time For Goal Achievement: 10/05/22 Potential to Achieve Goals: Good Progress towards PT goals: Progressing toward goals     Frequency    Min 2X/week      PT Plan Current plan remains appropriate    Co-evaluation              AM-PAC PT "6 Clicks" Mobility   Outcome Measure  Help needed turning from your back to your side while in a flat bed without using bedrails?: None Help needed moving from lying on your back to sitting on the side of a flat bed without using bedrails?: A Little Help needed moving to and from a bed to a chair (including a wheelchair)?: A Little Help needed standing up from a chair using your arms (e.g., wheelchair or bedside chair)?: A Little Help needed to walk in hospital room?: A Lot Help needed climbing 3-5 steps with a railing? : A Lot 6 Click Score: 17    End of Session Equipment Utilized During Treatment: Gait belt Activity Tolerance: Other (comment) (limited d/t dizziness) Patient left: in chair;with call bell/phone within reach;with chair alarm set;with family/visitor present;Other (comment) (reclined for safety) Nurse Communication: Mobility status;Precautions (pt's dizziness) PT Visit Diagnosis: Unsteadiness on feet (R26.81);Other abnormalities of gait and mobility (R26.89);Muscle weakness (generalized) (M62.81);Dizziness and giddiness (R42)     Time: 1610-9604 PT Time Calculation (min) (ACUTE  ONLY): 23 min  Charges:  $Therapeutic Activity: 23-37 mins                     Leitha Bleak, PT 09/22/22, 9:37 AM

## 2022-09-22 NOTE — Discharge Summary (Addendum)
Physician Discharge Summary   Patient: Erin Yates MRN: [id number] DOB: 01-07-1946  Admit date:     09/21/2022  Discharge date: 09/22/22  Discharge Physician: Ezekiel Slocumb   PCP: Adin Hector, MD   Recommendations at discharge:   Follow up with Primary Care in 1-2 weeks Follow up with ENT for evaluation of peripheral vertigo Recommend outpatient vestibular therapy, in addition to home health PT/OT Repeat BMP, CBC in 1-2 weeks Continue to follow up on frequent headaches.  Using Fioricet regularly. Consider referral to Neurology for headaches as needed  Discharge Diagnoses: Principal Problem:   Stroke [facility]) Active Problems:   AF (paroxysmal atrial fibrillation) ([place])   Hypertension   Hyperlipidemia LDL goal <70   Tobacco abuse   Type II diabetes mellitus with renal manifestations (HCC)   Stage 3b chronic kidney disease (CKD) (HCC)   Iron deficiency anemia   Depression with anxiety  Resolved Problems:   * No resolved hospital problems. [name], per Dr. Blaine Hamper: "Erin Yates is a 76 y.o. female with medical history significant of multiple strokes, [name], hypertension, hyperlipidemia, diet-controlled diabetes, GERD, hypothyroidism, depression with anxiety, [name], [name], iron deficiency anemia, tobacco abuse, who presents with weakness and dizziness.   Per her daughter at bedside, pt was LKN at about 9:30 last night when she went to bed. At about 3:00 AM, pt started having dizziness, feeling "wooshy and weak" in her legs and arms. She also has shaking in both lower extremity. She feels weaker and needs assistance to ambulate. Pt does not have chest pain, shortness breath.  Patient has mild dry cough.  No nausea, vomiting, diarrhea or abdominal pain.  No symptoms of UTI.  She took her last dose of Eliquis this morning.     Data reviewed independently and ED Course: pt was found to have WBC 7.8,  INR 1.0, PTT 32, UDS positive for barbiturates and benzo, negative urinalysis, stable renal function, blood pressure 142/119, heart rate 76, RR 28, oxygen saturation 98 to 100% on room air.  Patient is placed on telemetry bed for position, Dr. Cheral Marker of neurology is consulted."   Seen by Neurology.  They felt presentation not consistent with a new acute stroke, but peripheral vertigo in setting of anxiety attack.    Patient had significant improvement with meclizine, which also supports vertigo as etiology of her presenting symptoms.   Seen and evaluated by PT and OT.  Stable for discharge home with home health PT / OT and vestibular rehab.    ENT outpatient evaluation recommended for peripheral vertigo.    Daughter was at bedside during my rounds and with PT/OT evaluations, agrees patient doing much better since meclizine, agreeable to discharge home today.    Assessment and Plan: No notes have been filed under this hospital service. Service: Hospitalist  Stroke ruled out      Consultants: Neurology Procedures performed: None  Disposition: Home health Diet recommendation:  Discharge Diet Orders (From admission, onward)     Start     Ordered   09/22/22 0000  Diet - low sodium heart healthy        09/22/22 1230           Cardiac diet DISCHARGE MEDICATION: Allergies as of 09/22/2022       Reactions   Alendronate Other (See Comments)   Rash   Penicillins Rash   Also swelling and itching  Did it involve swelling of the face/tongue/throat, SOB, or low BP? Yes Did it involve sudden or severe rash/hives, skin peeling, or any reaction on the inside of your mouth or nose? Yes Did you need to seek medical attention at a hospital or doctor's office? Yes When did it last happen? More than 15 years ago If all above answers are "NO", may proceed with cephalosporin use.        Medication List     TAKE these medications    albuterol 108 (90 Base) MCG/ACT  inhaler Commonly known as: VENTOLIN HFA Inhale 2 puffs into the lungs every 6 (six) hours as needed for wheezing or shortness of breath.   ALPRAZolam 0.25 MG tablet Commonly known as: XANAX Take 1 tablet (0.25 mg total) by mouth 2 (two) times daily as needed for anxiety.   alum & mag hydroxide-simeth 200-200-20 MG/5ML suspension Commonly known as: MAALOX/MYLANTA Take 15 mLs by mouth every 4 (four) hours as needed for indigestion or heartburn.   apixaban 2.5 MG Tabs tablet Commonly known as: ELIQUIS Take 1 tablet (2.5 mg total) by mouth 2 (two) times daily.   atorvastatin 80 MG tablet Commonly known as: LIPITOR Take 1 tablet (80 mg total) by mouth daily.   buPROPion 150 MG 12 hr tablet Commonly known as: WELLBUTRIN SR Take 1 tablet (150 mg total) by mouth 2 (two) times daily. What changed:  how much to take additional instructions   butalbital-acetaminophen-caffeine 50-325-40 MG tablet Commonly known as: FIORICET Take 1 tablet by mouth every 6 (six) hours as needed for headache.   cholecalciferol 25 MCG (1000 UNIT) tablet Commonly known as: VITAMIN D3 Take 1,000 Units by mouth daily.   desvenlafaxine 100 MG 24 hr tablet Commonly known as: PRISTIQ Take 100 mg by mouth daily.   ferrous sulfate 325 (65 FE) MG tablet Take 325 mg by mouth every evening.   fluticasone 50 MCG/ACT nasal spray Commonly known as: FLONASE Place into both nostrils daily as needed for allergies or rhinitis.   hydrOXYzine 25 MG tablet Commonly known as: ATARAX Take 25 mg by mouth 3 (three) times daily as needed.   meclizine 25 MG tablet Commonly known as: ANTIVERT Take 1 tablet (25 mg total) by mouth 3 (three) times daily as needed for dizziness.   nicotine 21 mg/24hr patch Commonly known as: NICODERM CQ - dosed in mg/24 hours Place 1 patch (21 mg total) onto the skin daily.   pantoprazole 40 MG tablet Commonly known as: PROTONIX Take 40 mg by mouth 2 (two) times daily. Reported on  04/17/2016   traZODone 50 MG tablet Commonly known as: DESYREL Take 50 mg by mouth at bedtime as needed for sleep.   vitamin B-12 500 MCG tablet Commonly known as: CYANOCOBALAMIN Take 500 mcg by mouth every evening.        Follow-up Information     Clyde Canterbury, MD. Schedule an appointment as soon as possible for a visit.   Specialty: Otolaryngology Why: Referral for peripheral vertigo, appears PCP referred previously but cannot tell if she's been seen or not. Contact information: [address] [place] [address] [phone]         Adin Hector, MD. Schedule an appointment as soon as possible for a visit.   Specialty: Internal Medicine Why: Hospital follow up Contact information: [facility] [place] [address] [phone]  Discharge Exam: There were no vitals filed for this visit.  General exam: awake, alert, no acute distress, frail HEENT: moist mucus membranes, hearing grossly normal  Respiratory system: CTAB but generally diminished, no wheezes, rales or rhonchi, normal respiratory effort. Cardiovascular system: normal S1/S2, RRR, no JVD, murmurs, rubs, gallops, no pedal edema.   Gastrointestinal system: soft, NT, ND, no HSM felt, +bowel sounds. Central nervous system: A&O x4. no gross focal neurologic deficits, normal speech Extremities: moves all, no edema, normal tone Skin: dry, intact, normal temperature, normal color, No rashes, lesions or ulcers Psychiatry: normal mood, congruent affect, judgement and insight appear normal   Condition at discharge: stable  The results of significant diagnostics from this hospitalization (including imaging, microbiology, ancillary and laboratory) are listed below for reference.   Imaging Studies: CT ANGIO HEAD NECK W WO CM W PERF (CODE STROKE)  Result Date: 09/21/2022 CLINICAL DATA:  Neuro deficit, acute, stroke suspected EXAM: CT  ANGIOGRAPHY HEAD AND NECK CT PERFUSION BRAIN TECHNIQUE: Multidetector CT imaging of the head and neck was performed using the standard protocol during bolus administration of intravenous contrast. Multiplanar CT image reconstructions and MIPs were obtained to evaluate the vascular anatomy. Carotid stenosis measurements (when applicable) are obtained utilizing NASCET criteria, using the distal internal carotid diameter as the denominator. Multiphase CT imaging of the brain was performed following IV bolus contrast injection. Subsequent parametric perfusion maps were calculated using RAPID software. RADIATION DOSE REDUCTION: This exam was performed according to the departmental dose-optimization program which includes automated exposure control, adjustment of the mA and/or kV according to patient size and/or use of iterative reconstruction technique. CONTRAST:  111mL OMNIPAQUE IOHEXOL 350 MG/ML SOLN COMPARISON:  None Available. MRI and MRA 05/29/2022. CTA head/neck February 07, 2016. FINDINGS: CTA NECK FINDINGS Aortic arch: Great vessel origins are patent without significant stenosis. Aortic atherosclerosis. Right carotid system: No evidence of greater than 50% stenosis. Mild stenosis of the right ICA. Mild irregularity of the upper ICA. Left carotid system: No evidence of greater than 50% stenosis. Mild carotid bifurcation and ICA atherosclerosis. Vertebral arteries: Both vertebral arteries are patent. Moderate stenosis of the left V2 vertebral artery in the mid neck. Skeleton: Multilevel degenerative change. Other neck: No acute findings. Upper chest: Emphysema. Review of the MIP images confirms the above findings CTA HEAD FINDINGS Anterior circulation: Chronic stenosis of the left A1 ACA. Otherwise, bilateral intracranial ICAs, MCAs, and ACAs are patent without proximal hemodynamically significant stenosis. Posterior circulation: Chronic stenosis of the right P2 PCA. Otherwise, bilateral intradural vertebral arteries,  basilar artery and bilateral posterior cerebral arteries are patent without proximal hemodynamically significant stenosis. Venous sinuses: As permitted by contrast timing, patent. CT Brain Perfusion Findings: CBF (<30%) Volume: 40mL Perfusion (Tmax>6.0s) volume: 79mL Mismatch Volume: 51mL Infarction Location:Right MCA territory. IMPRESSION: CTA: 1. No emergent large vessel occlusion. 2. 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 3. 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 4. Aortic Atherosclerosis (ICD10-I70.0) and Emphysema (ICD10-J43.9). CT perfusion: 1. Evidence of core infarct in the region of the known prior right MCA territory infarct. Rapid reports a small (7 mL) area of mismatch/penumbra. This could be artifactual or represent small acute on chronic peri-infarct ischemia. An MRI could better assess for acute infarct. Findings discussed with Dr. Cheral Marker via telephone at 9:14 a.m. Electronically Signed   By: Margaretha Sheffield M.D.   On: 09/21/2022 09:25   CT HEAD CODE STROKE WO CONTRAST  Result Date: 09/21/2022 CLINICAL DATA:  Code stroke.  Dizziness.  Leg weakness. EXAM: CT HEAD WITHOUT CONTRAST TECHNIQUE: Contiguous axial images were obtained from the base of the skull through the vertex without intravenous contrast. RADIATION DOSE REDUCTION: This exam was performed according to the departmental dose-optimization program which includes automated exposure control, adjustment of the mA and/or kV according to patient size and/or use of iterative reconstruction technique. COMPARISON:  MRI Brain 05/28/22 FINDINGS: Brain: Redemonstrated are large areas of encephalomalacia in the bilateral MCA territories in the left PCA territory, unchanged compared to MRI brain 05/28/2022. there is no CT evidence of a new site of infarct. No hemorrhage. No hydrocephalus. No extra-axial fluid collection. Vascular: No hyperdense vessel or unexpected calcification. Calcification of the carotid siphons  bilaterally. Skull: Normal. Negative for fracture or focal lesion. Sinuses/Orbits: No acute finding.  Bilateral lens replacements. Other: None. ASPECTS [facility] Stroke Program Early CT Score) -not applicable given history of prior strokes in the bilateral MCA territories. There are no new hypodensities in this region. IMPRESSION: 1. No hemorrhage or CT evidence of new infarct. 2. Redemonstrated large areas of encephalomalacia in the bilateral MCA territories in the left PCA territory, unchanged compared to MRI brain 05/28/2022. Findings were paged to Dr. Cheral Marker on 09/21/22 at 8:39 AM via [facility] paging system. 2 Electronically Signed   By: Marin Roberts M.D.   On: 09/21/2022 08:39    Microbiology: Results for orders placed or performed during the hospital encounter of 05/28/22  Resp Panel by RT-PCR (Flu A&B, Covid) Anterior Nasal Swab     Status: None   Collection Time: 05/28/22  4:08 PM   Specimen: Anterior Nasal Swab  Result Value Ref Range Status   SARS Coronavirus 2 by RT PCR NEGATIVE NEGATIVE Final    Comment: (NOTE) SARS-CoV-2 target nucleic acids are NOT DETECTED.  The SARS-CoV-2 RNA is generally detectable in upper respiratory specimens during the acute phase of infection. The lowest concentration of SARS-CoV-2 viral copies this assay can detect is 138 copies/mL. A negative result does not preclude SARS-Cov-2 infection and should not be used as the sole basis for treatment or other patient management decisions. A negative result may occur with  improper specimen collection/handling, submission of specimen other than nasopharyngeal swab, presence of viral mutation(s) within the areas targeted by this assay, and inadequate number of viral copies(<138 copies/mL). A negative result must be combined with clinical observations, patient history, and epidemiological information. The expected result is Negative.  Fact Sheet for Patients:  EntrepreneurPulse.com.au  Fact  Sheet for Healthcare Providers:  IncredibleEmployment.be  This test is no t yet approved or cleared by the [place] FDA and  has been authorized for detection and/or diagnosis of SARS-CoV-2 by FDA under an Emergency Use Authorization (EUA). This EUA will remain  in effect (meaning this test can be used) for the duration of the COVID-19 declaration under Section 564(b)(1) of the Act, 21 U.S.C.section 360bbb-3(b)(1), unless the authorization is terminated  or revoked sooner.       Influenza A by PCR NEGATIVE NEGATIVE Final   Influenza B by PCR NEGATIVE NEGATIVE Final    Comment: (NOTE) The Xpert Xpress SARS-CoV-2/FLU/RSV plus assay is intended as an aid in the diagnosis of influenza from Nasopharyngeal swab specimens and should not be used as a sole basis for treatment. Nasal washings and aspirates are unacceptable for Xpert Xpress SARS-CoV-2/FLU/RSV testing.  Fact Sheet for Patients: EntrepreneurPulse.com.au  Fact Sheet for Healthcare Providers: IncredibleEmployment.be  This test is not yet approved or cleared by the [place] and  has been authorized for detection and/or diagnosis of SARS-CoV-2 by FDA under an Emergency Use Authorization (EUA). This EUA will remain in effect (meaning this test can be used) for the duration of the COVID-19 declaration under Section 564(b)(1) of the Act, 21 U.S.C. section 360bbb-3(b)(1), unless the authorization is terminated or revoked.  Performed at [facility], [address] [place], [place] [address]     Labs: CBC: Recent Labs  Lab 09/21/22 0804  WBC 7.8  NEUTROABS 4.6  HGB 11.4*  HCT 33.6*  MCV 93.9  PLT 800   Basic Metabolic Panel: Recent Labs  Lab 09/21/22 0804  NA 140  K 4.1  CL 106  CO2 25  GLUCOSE 190*  BUN 17  CREATININE 1.64*  CALCIUM 9.3   Liver Function Tests: Recent Labs  Lab 09/21/22 0804  AST 20  ALT 16  ALKPHOS 97   BILITOT 0.4  PROT 7.6  ALBUMIN 3.6   CBG: Recent Labs  Lab 09/21/22 0801  GLUCAP 194*    Discharge time spent: greater than 30 minutes.  Signed: Ezekiel Slocumb, DO [facility] 09/22/2022

## 2022-09-22 NOTE — Progress Notes (Signed)
Pt being discharged home, discharge reviewed with pt, husband and son, states understanding, pt with no complaints

## 2022-09-22 NOTE — Progress Notes (Signed)
Patient is not able to walk the distance required to go the bathroom, or he/she is unable to safely negotiate stairs required to access the bathroom.  A 3in1 BSC will alleviate this problem  

## 2022-09-22 NOTE — TOC Initial Note (Addendum)
Transition of Care [facility]) - Initial/Assessment Note    Patient Details  Name: Erin Yates MRN: [id number] Date of Birth: 10/01/1946  Transition of Care [facility]) CM/SW Contact:    Magnus Ivan, LCSW Phone Number: 09/22/2022, 1:13 PM  Clinical Narrative:                 Patient to DC home today with [facility].  Spoke to daughter Roselyn Reef via phone who confirms they have chosen to take patient home with [facility].  Patient lives with her husband who takes her to appointments. Roselyn Reef is also available for transportation. PCP is Dr. [name]. Pharmacy is [facility] or mail order through [place].  Patient has a RW, cane, shower seat, and wheelchair at home.  CSW explained additional DME recs for hospital bed and 3in1. Family is agreeable to 3in1, declines hospital bed at this time. Family would like to use [facility] if possible as they have used them in the past.  Per DO, patient would benefit from Vestibular Rehab. CSW made [facility] referral which was accepted by Judson Roch with [facility] for PT and OT. Judson Roch confirmed they can do Vestibular Rehab in patient's home through their PT program. Judson Roch is aware of DC home today.     Expected Discharge Plan: [place] Barriers to Discharge: Barriers Resolved   Patient Goals and CMS Choice Patient states their goals for this hospitalization and ongoing recovery are:: home with home health CMS Medicare.gov Compare Post Acute Care list provided to:: Patient Represenative (must comment) Choice offered to / list presented to : Adult Children  Expected Discharge Plan and Services Expected Discharge Plan: [place]       Living arrangements for the past 2 months: Single Family Home Expected Discharge Date: 09/22/22               DME Arranged: 3-N-1 DME Agency: [employer] Date DME Agency Contacted: 09/22/22   Representative spoke with at DME Agency: Suanne Marker HH Arranged: PT, OT [place] Agency: [facility] Date [place]:  09/22/22   Representative spoke with at [place]: Sarah  Prior Living Arrangements/Services Living arrangements for the past 2 months: [place] with:: Spouse Patient language and need for interpreter reviewed:: Yes Do you feel safe going back to the place where you live?: Yes      Need for Family Participation in Patient Care: Yes (Comment) Care giver support system in place?: Yes (comment) Current home services: DME Criminal Activity/Legal Involvement Pertinent to Current Situation/Hospitalization: No - Comment as needed  Activities of Daily Living Home Assistive Devices/Equipment: Eyeglasses ADL Screening (condition at time of admission) Patient's cognitive ability adequate to safely complete daily activities?: Yes Is the patient deaf or have difficulty hearing?: No Does the patient have difficulty seeing, even when wearing glasses/contacts?: No Does the patient have difficulty concentrating, remembering, or making decisions?: No Patient able to express need for assistance with ADLs?: Yes Does the patient have difficulty dressing or bathing?: Yes Independently performs ADLs?: No Communication: Independent Dressing (OT): Needs assistance Is this a change from baseline?: Change from baseline, expected to last <3days Grooming: Needs assistance Is this a change from baseline?: Change from baseline, expected to last <3 days Feeding: Independent Bathing: Needs assistance Is this a change from baseline?: Change from baseline, expected to last <3 days Toileting: Needs assistance Is this a change from baseline?: Change from baseline, expected to last <3 days In/Out Bed: Needs assistance  Is this a change from baseline?: Change from baseline, expected to last <3 days Walks in Home: Needs assistance Is this a change from baseline?: Change from baseline, expected to last <3 days Does the patient have difficulty walking or climbing stairs?: Yes Weakness of Legs:  Both Weakness of Arms/Hands: None  Permission Sought/Granted Permission sought to share information with : Facility Art therapist granted to share information with : Yes, Verbal Permission Granted     Permission granted to share info w AGENCY: [facility], DME agencies        Emotional Assessment         Alcohol / Substance Use: Not Applicable Psych Involvement: No (comment)  Admission diagnosis:  Stroke [facility]) [I63.9] Acute ischemic stroke [facility]) [I63.9] Patient Active Problem List   Diagnosis Date Noted   Peripheral vertigo 09/22/2022   Stage 3b chronic kidney disease (CKD) ([place]) 09/21/2022   Depression with anxiety 09/21/2022   Iron deficiency anemia 09/21/2022   Acute left PCA stroke ([place]) 05/30/2022   Headache 05/30/2022   Acute CVA (cerebrovascular accident) ([place]) 05/29/2022   Ataxia 05/28/2022   History of embolic stroke 80/01/4916   Agitation 10/12/2020   Acute delirium 10/12/2020   AKI (acute kidney injury) ([place]) 10/12/2020   Aortic atherosclerosis ([place]) 10/12/2020   Elevated troponin 10/12/2020   Normocytic anemia 10/12/2020   Acute R MCA ischemic stroke ([place]) s/p clot retrieval, likely embolic d/t AF even though on AC    DMII (diabetes mellitus, type 2) ([place])    Essential hypertension    Atrial fibrillation with RVR ([place])    Dysphagia, post-stroke    Mitral valve disorder    AF (paroxysmal atrial fibrillation) ([place]) 11/13/2019   CVA (cerebral vascular accident) ([place]) 11/11/2019   Stroke ([place]) 11/10/2019   Type II diabetes mellitus with renal manifestations ([place]) 11/10/2019   Depression    GERD (gastroesophageal reflux disease)    Hypertension    Hypothyroidism    Hyperlipidemia LDL goal <70    Tobacco abuse    Cryptogenic stroke ([place]) 03/27/2016   Stroke (cerebrum) ([place])    CVA (cerebral infarction) 02/07/2016   PCP:  Adin Hector, MD Pharmacy:   [facility] [id number], [address] [place] [address] Phone: [phone] Fax: [phone]  [facility] [place] [place] [address] Phone: [phone] Fax: [phone]     Social Determinants of Health (SDOH) Interventions    Readmission Risk Interventions     No data to display

## 2022-09-22 NOTE — Progress Notes (Signed)
Pt discharged home with family. PIV and tele removed. Equipment delivered to bedside. Discharge instructions provided to patient and family, verbalize understanding.

## 2022-09-22 NOTE — Progress Notes (Signed)
Speech Language Pathology Treatment: Dysphagia (education)  Patient Details Name: Erin Yates MRN: [id number] DOB: Apr 06, 1946 Today's Date: 09/22/2022 Time: 0932-6712 SLP Time Calculation (min) (ACUTE ONLY): 25 min  Assessment / Plan / Recommendation Clinical Impression  Pt seen for f/u of toleration of diet; Daughter present in room. Pt resting in bed, verbal and followed along w/ conversation and instructions adequate and w/ repetition. Daughter stated they are present w/ her at home to support ADLs. Pt appears to present w/ generalized weakness. 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 Per chart notes, residual vision deficits, concentration/focus/memory deficits post previous CVAs.  Daughter stated pt appears close to her baseline at this time w/ her communication skills/abilities; benefits from time given during communication/conversation/questions to aid processing and responding. Encouraged Reducing Distractions during communication/conversation to support pt; Dtr agreed.   On RA; afebrile, wbc wnl.   Pt appears to present w/ adequate oropharyngeal phase swallow function w/ No oropharyngeal phase dysphagia noted, No neuromuscular deficits noted. Pt appears at reduced risk for aspiration following general aspiration precautions.  However, pt does have challenging factors that could impact her oropharyngeal swallowing to include generalized weakness and need for support w/ feeding at meals, vision deficits, and previous CVAs w/ MCI per report. These factors can increase risk for dysphagia, aspiration as well as decreased oral intake overall.  Recommend a more mech soft consistency diet(for ease of cutting per Family request d/t the vision deficits) w/ well-Cut meats, moistened foods; Thin liquids via CUP. Pt should help to Hold Cup when drinking. Recommend general aspiration precautions, reduce distractions during meals. Support feeding at meals -- more finger  foods. Pills WHOLE in Puree for safer, easier swallowing -- pt has done this in the past, and it was encourged now and for D/C to the Dtr.   Education given on Pills in Puree; food consistencies and easy to eat options. Discussed and modeled general aspiration precautions to pt and Dtr. Handout given, reviewed. Daughter and pt agreed. NSG to reconsult if any new needs arise. Recommend Dietician f/u for support.   HPI HPI: Pt is a 76 y.o. female with a history of prior multiple strokes, hypertension, diabetes, hyperlipidemia, paroxysmal atrial fibrillation on Eliquis who comes to the ED today complaining of dizziness and feeling uncoordinated which started at 3:00 PM yesterday.,  Stent, gradually worsening through the evening.  Generalized weakness overall at this eval per pt and Family present.  Family also endorsed Cognitive-linguistic decline at Baseline.  CT Angio of Head: No emergent large vessel occlusion.  2. 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  3. 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  No hemorrhage or CT evidence of new infarct.  2. Redemonstrated large areas of encephalomalacia in the bilateral  MCA territories in the left PCA territory, unchanged compared to MRI brain 05/28/2022.      SLP Plan  All goals met      Recommendations for follow up therapy are one component of a multi-disciplinary discharge planning process, led by the attending physician.  Recommendations may be updated based on patient status, additional functional criteria and insurance authorization.    Recommendations  Diet recommendations: Dysphagia 3 (mechanical soft);Thin liquid (for ease of eating/chewing) Liquids provided via: Cup;No straw Medication Administration: Whole meds with puree Supervision: Patient able to self feed;Intermittent supervision to cue for compensatory strategies Compensations: Minimize environmental distractions;Slow rate;Small sips/bites;Follow solids with  liquid Postural Changes and/or Swallow Maneuvers:  Out of bed for meals;Seated upright 90 degrees;Upright 30-60 min after meal                General recommendations:  (Dietician f/u) Oral Care Recommendations: Oral care BID;Oral care before and after PO;Staff/trained caregiver to provide oral care Follow Up Recommendations: No SLP follow up Assistance recommended at discharge: Set up Supervision/Assistance (positioning) SLP Visit Diagnosis: Dysphagia, unspecified (R13.10) Plan: All goals met            Orinda Kenner, [place], [place]; [place] [phone] (ascom) Erin Yates  09/22/2022, 4:02 PM

## 2022-09-22 NOTE — Plan of Care (Signed)

## 2022-09-22 NOTE — Evaluation (Signed)
Occupational Therapy Evaluation Patient Details Name: Erin Yates MRN: [id number] DOB: April 11, 1946 Today's Date: 09/22/2022   History of Present Illness Pt is a 76 y.o. female presenting to hospital 11/16 with c/o dizziness and feeling uncoordinated yesterday; woke up today feeling like R side weak as well.  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  PMH includes strokes (peripheral vision loss and R sided weakness residual deficits), htn, DM, HLD, paroxysmal [name], anxiety, PFO s/p repair 2010.   Clinical Impression   Patient received for OT evaluation. See flowsheet below for details of function. Generally, patient requiring SBA for bed mobility, CGA for functional mobility with RW, and CGA-MOD A for ADLs. Patient will benefit from continued OT while in acute care.  Lengthy discussion held with patient/daughter about d/c recommendation. Pt has been given a dose of meclizine for her dizziness prior to OT session; this appears to be working, as pt reports almost no dizziness throughout session. Pt able to mobilize with CGA-SBA using RW; no overt losses of balance; stood at sink for ADLs. Discussion with pt/daughter about d/c recommendation for Home Health services if pt can maintain this level of function. The unknown factor is when the medication wears off (and what time that will be) what the effect will be on patients balance and mobility. OT referred pt/daughter to medical team to discuss those questions. Patient is very eager to get home, daughter states this is mostly because pt wants to smoke a cigarette; OT  recommends pt remain in hospital until the question of how pt's balance responds to the wearing off of medication (or if pt can maintain medication to ward off further dizziness) to ensure safety at home. It is very likely that pt is making unsafe choices at home anyway (such as getting up without assistance, refusing to use walker- patient states she uses it and daughter states she does not) and that she is currently close to functional/mobility baseline. However, her fall risk greatly increases if she is dizzy (as evidenced by earlier PT sessions where pt required assistance for transfers and was not safe to mobilize in the room). If pt is unable to continue with the medication or the effect changes, she will require SNF rehab due to excessively high falls risk.      Recommendations for follow up therapy are one component of a multi-disciplinary discharge planning process, led by the attending physician.  Recommendations may be updated based on patient status, additional functional criteria and insurance authorization.   Follow Up Recommendations  Home health OT (ONLY IF PATIENT MAINTAINS CURRENT LEVEL OF BALANCE WITH DIZZINESS MEDICATION. If pt status changes or medication for dizziness becomes inadequate to control dizziness, pt will require SNF rehab for increased safety and training on mobility- fall risk.)     Assistance Recommended at Discharge Frequent or constant Supervision/Assistance  Patient can return home with the following A little help with walking and/or transfers;A lot of help with bathing/dressing/bathroom;Assistance with cooking/housework;Direct supervision/assist for medications management;Direct supervision/assist for financial management;Assist for transportation;Help with stairs or ramp for entrance    Functional Status Assessment  Patient has had a recent decline in their functional  status and demonstrates the ability to make significant improvements in function in a  reasonable and predictable amount of time.  Equipment Recommendations  Other (comment) (pt has all needed DME at home)    Recommendations for Other Services       Precautions / Restrictions Precautions Precautions: Fall Precaution Comments: aspiration Restrictions Weight Bearing Restrictions: No      Mobility Bed Mobility Overal bed mobility: Needs Assistance Bed Mobility: Supine to Sit, Sit to Supine     Supine to sit: Supervision Sit to supine: Supervision   General bed mobility comments: improved since PT assessment due to medication given for dizziness prior to OT session    Transfers Overall transfer level: Needs assistance Equipment used: Rolling walker (2 wheels) Transfers: Sit to/from Stand Sit to Stand: Min guard, Supervision           General transfer comment: cues for hand placement; gait belt utilized for safety      Balance                                           ADL either performed or assessed with clinical judgement   ADL Overall ADL's : Needs assistance/impaired Eating/Feeding: Set up;Sitting   Grooming: Min guard;Standing;Oral care Grooming Details (indicate cue type and reason): Pt stood at sink to brush teeth today; required extra time to find items due to visual deficit; no loss of balance with intermittent use of BIL UE (no UE support for balance) during task. Upper Body Bathing: Set up;Sitting Upper Body Bathing Details (indicate cue type and reason): anticipated Lower Body Bathing: Moderate assistance;Sit to/from stand (anticipated)   Upper Body Dressing : Set up;Sitting (anticipated; daughter states that at home patient will often get her shirt on backwards or inside out)   Lower Body Dressing: Moderate assistance;Sit to/from stand (anticiapted)   Toilet Transfer: Min guard;Rolling walker (2 wheels) (anticipated)   Toileting- Water quality scientist and Hygiene: Min guard;Sit to/from stand (anticipated)   Tub/  Shower Transfer: Min guard;Rolling walker (2 wheels) (anticipated)   Functional mobility during ADLs: Min guard;Rolling walker (2 wheels) General ADL Comments: Patient is much improved at this time with use of meclizine to control dizziness. Albe to stand at the sink with CGA- close SBA for grooming; able to sit EOB without loss of balance with only BIL feet supported.     Vision Baseline Vision/History:  (patient has visual deficits in peripheral vision from prior CVA) Vision Assessment?:  (Impaired from prior CVA)     Perception     Praxis      Pertinent Vitals/Pain Pain Assessment Pain Assessment: 0-10 Pain Score:  (pt did not comment)     Hand Dominance Right   Extremity/Trunk Assessment Upper Extremity Assessment Upper Extremity Assessment: Overall WFL for tasks assessed   Lower Extremity Assessment Lower Extremity Assessment: Overall WFL for tasks assessed;Defer to PT evaluation       Communication Communication Communication: No difficulties (Some intermittent word finding difficulty)   Cognition Arousal/Alertness: Awake/alert Behavior During Therapy: Impulsive Overall Cognitive Status: Within Functional Limits for tasks assessed                                       General Comments       Exercises     Shoulder Instructions  Home Living Family/patient expects to be discharged to:: Private residence Living Arrangements: Spouse/significant other (and dog) Available Help at Discharge: Family;Available PRN/intermittently (husband is "on the property" all the time, but not always immediately in vicinity of pt; pt does not always call for assistance, per daughter) Type of Home: House Home Access: Ramped entrance     Home Layout: Two level;Able to live on main level with bedroom/bathroom     Bathroom Shower/Tub: Teacher, early years/pre: Standard     Home Equipment: Standard Walker;Cane - single point;Shower seat;Other  (comment) (3 wheeled walker)   Additional Comments: Pt does not use any AD for mobility at baseline; explains in detail how she furniture cruises throughout the house.      Prior Functioning/Environment Prior Level of Function : Needs assist  Cognitive Assist : Mobility (cognitive);ADLs (cognitive) Mobility (Cognitive): Intermittent cues ADLs (Cognitive): Intermittent cues Physical Assist : ADLs (physical)   ADLs (physical): Dressing;Bathing   ADLs Comments: Assist for bathing, dressing, meals, medications, cleaning (from husband).        OT Problem List: Impaired balance (sitting and/or standing);Impaired vision/perception;Decreased safety awareness      OT Treatment/Interventions: Self-care/ADL training;Therapeutic exercise;Visual/perceptual remediation/compensation;Patient/family education    OT Goals(Current goals can be found in the care plan section) Acute Rehab OT Goals Patient Stated Goal: Go home; smoke a cigarette; be with my dog OT Goal Formulation: With patient/family Time For Goal Achievement: 10/06/22 Potential to Achieve Goals: Good ADL Goals Pt Will Perform Grooming: with modified independence;standing Pt Will Perform Lower Body Dressing: with min assist;sit to/from stand Pt Will Transfer to Toilet: with modified independence;regular height toilet  OT Frequency: Min 3X/week    Co-evaluation              AM-PAC OT "6 Clicks" Daily Activity     Outcome Measure Help from another person eating meals?: None Help from another person taking care of personal grooming?: A Little Help from another person toileting, which includes using toliet, bedpan, or urinal?: A Little Help from another person bathing (including washing, rinsing, drying)?: A Lot Help from another person to put on and taking off regular upper body clothing?: None Help from another person to put on and taking off regular lower body clothing?: A Lot 6 Click Score: 18   End of Session  Equipment Utilized During Treatment: Gait belt;Rolling walker (2 wheels) Nurse Communication: Mobility status  Activity Tolerance: Other (comment) (Patient limited by decreased balance) Patient left: in bed;with call bell/phone within reach;with bed alarm set;with family/visitor present  OT Visit Diagnosis: Unsteadiness on feet (R26.81)                Time: 5176-1607 OT Time Calculation (min): 28 min Charges:  OT General Charges $OT Visit: 1 Visit OT Evaluation $OT Eval Moderate Complexity: 1 Mod OT Treatments $Self Care/Home Management : 8-22 mins  Waymon Amato, MS, OTR/L   Vania Rea 09/22/2022, 11:35 AM

## 2022-09-22 NOTE — Progress Notes (Deleted)
PCP:  Adin Hector, MD Primary Cardiologist: Virl Axe, MD Electrophysiologist: Virl Axe, MD   Akeela Busk is a 76 y.o. female seen today for Virl Axe, MD for {VISITTYPE:28148}  Past Medical History:  Diagnosis Date   Acute renal failure superimposed on stage 3b chronic kidney disease ([place]) 10/12/2020   Anemia    past  hx   Anxiety    Confusion 05/28/2022   Depression    Diabetes mellitus without complication ([place])    Dysrhythmia    prior to PFO correction   GERD (gastroesophageal reflux disease)    Headache    before PFO closure   Hypercholesteremia    Hypertension    Hypothyroidism    Peripheral vision loss    residual from CVA   PFO (patent foramen ovale)    correction device placed 2010   Right leg numbness    lower leg- residual from CVA   Seasonal allergies    Stroke [facility])    prior to PFO correction   Past Surgical History:  Procedure Laterality Date   ABDOMINAL HYSTERECTOMY     BUBBLE STUDY  02/06/2020   Procedure: BUBBLE STUDY;  Surgeon: Sanda Klein, MD;  Location: [place];  Service: Cardiovascular;;   CARDIAC CATHETERIZATION     [facility]   CATARACT EXTRACTION W/PHACO Right 09/01/2015   Procedure: CATARACT EXTRACTION PHACO AND INTRAOCULAR LENS PLACEMENT ([place]);  Surgeon: Leandrew Koyanagi, MD;  Location: [place];  Service: Ophthalmology;  Laterality: Right;  DIABETIC - oral meds   CATARACT EXTRACTION W/PHACO Left 10/20/2015   Procedure: CATARACT EXTRACTION PHACO AND INTRAOCULAR LENS PLACEMENT ([facility]);  Surgeon: Leandrew Koyanagi, MD;  Location: [place];  Service: Ophthalmology;  Laterality: Left;  DIABETIC - oral meds   EP IMPLANTABLE DEVICE N/A 04/04/2016   Procedure: Loop Recorder Insertion;  Surgeon: Deboraha Sprang, MD;  Location: [place] [facility];  Service: Cardiovascular;  Laterality: N/A;   FOOT NEUROMA SURGERY Left    IR CT HEAD LTD  10/02/2020   IR PERCUTANEOUS ART THROMBECTOMY/INFUSION INTRACRANIAL  INC DIAG ANGIO  10/02/2020   IR US GUIDE VASC ACCESS RIGHT  10/02/2020   NASAL SINUS SURGERY     PATENT FORAMEN OVALE CLOSURE     PATENT FORAMEN OVALE CLOSURE     RADIOLOGY WITH ANESTHESIA N/A 10/02/2020   Procedure: IR WITH ANESTHESIA;  Surgeon: Luanne Bras, MD;  Location: [place];  Service: Radiology;  Laterality: N/A;   TEE WITHOUT CARDIOVERSION N/A 02/09/2016   Procedure: TRANSESOPHAGEAL ECHOCARDIOGRAM (TE[facility]);  Surgeon: Adrian Prows, MD;  Location: [place];  Service: Cardiovascular;  Laterality: N/A;   TEE WITHOUT CARDIOVERSION N/A 11/12/2019   Procedure: TRANSESOPHAGEAL ECHOCARDIOGRAM ([facility]);  Surgeon: Minna Merritts, MD;  Location: [facility];  Service: Cardiovascular;  Laterality: N/A;   TEE WITHOUT CARDIOVERSION N/A 02/06/2020   Procedure: TRANSESOPHAGEAL ECHOCARDIOGRAM (TEE);  Surgeon: Sanda Klein, MD;  Location[facility];  Service: Cardiovascular;  Laterality: N/A;    No current facility-administered medications for this visit.   No current outpatient medications on file.   Facility-Administered Medications Ordered in Other Visits  Medication Dose Route Frequency Provider Last Rate Last Admin   acetaminophen (TYLENOL) tablet 650 mg  650 mg Oral Q4H PRN Ivor Costa, MD   650 mg at 09/22/22 1137   Or   acetaminophen (TYLENOL) 160 MG/5ML solution 650 mg  650 mg Per Tube Q4H PRN Ivor Costa, MD       Or   acetaminophen (TYLENOL) suppository  650 mg  650 mg Rectal Q4H PRN Ivor Costa, MD       albuterol (PROVENTIL) (2.5 MG/3ML) 0.083% nebulizer solution 3 mL  3 mL Inhalation Q6H PRN Ivor Costa, MD       ALPRAZolam Duanne Moron) tablet 0.25 mg  0.25 mg Oral BID PRN Ivor Costa, MD   0.25 mg at 09/22/22 0759   alum & mag hydroxide-simeth (MAALOX/MYLANTA) 200-200-20 MG/5ML suspension 15 mL  15 mL Oral Q4H PRN Ivor Costa, MD       apixaban Arne Cleveland) tablet 2.5 mg  2.5 mg Oral BID Ivor Costa, MD   2.5 mg at 09/22/22 0800   atorvastatin (LIPITOR) tablet 80 mg  80 mg Oral Daily Ivor Costa,  MD   80 mg at 09/22/22 0800   buPROPion (WELLBUTRIN SR) 12 hr tablet 150 mg  150 mg Oral QHS Lorin Picket, RPH   150 mg at 09/21/22 2151   buPROPion (WELLBUTRIN SR) 12 hr tablet 300 mg  300 mg Oral Daily Ivor Costa, MD   300 mg at 09/22/22 4132   butalbital-acetaminophen-caffeine (FIORICET) 50-325-40 MG per tablet 1 tablet  1 tablet Oral Q6H PRN Ivor Costa, MD   1 tablet at 09/22/22 0802   cholecalciferol (VITAMIN D3) 25 MCG (1000 UNIT) tablet 1,000 Units  1,000 Units Oral Daily Ivor Costa, MD   1,000 Units at 09/22/22 0800   cyanocobalamin (VITAMIN B12) tablet 500 mcg  500 mcg Oral QPM Ivor Costa, MD   500 mcg at 09/21/22 1645   ferrous sulfate tablet 325 mg  325 mg Oral QPM Ivor Costa, MD       fluticasone (FLONASE) 50 MCG/ACT nasal spray 1 spray  1 spray Each Nare Daily PRN Ivor Costa, MD       hydrALAZINE (APRESOLINE) injection 5 mg  5 mg Intravenous Q2H PRN Ivor Costa, MD       meclizine (ANTIVERT) tablet 25 mg  25 mg Oral TID PRN Ivor Costa, MD   25 mg at 09/22/22 0908   nicotine (NICODERM CQ - dosed in mg/24 hours) patch 21 mg  21 mg Transdermal Daily Nicole Kindred A, DO   21 mg at 09/22/22 0823   ondansetron (ZOFRAN) injection 4 mg  4 mg Intravenous Q8H PRN Ivor Costa, MD       pantoprazole (PROTONIX) EC tablet 40 mg  40 mg Oral Q1200 Ivor Costa, MD   40 mg at 09/22/22 1135   senna-docusate (Senokot-S) tablet 1 tablet  1 tablet Oral QHS PRN Ivor Costa, MD       traZODone (DESYREL) tablet 50 mg  50 mg Oral QHS PRN Ivor Costa, MD   50 mg at 09/21/22 2259   venlafaxine XR (EFFEXOR-XR) 24 hr capsule 150 mg  150 mg Oral Q breakfast Ivor Costa, MD   150 mg at 09/22/22 4401    Allergies  Allergen Reactions   Alendronate Other (See Comments)    Rash    Penicillins Rash    Also swelling and itching  Did it involve swelling of the face/tongue/throat, SOB, or low BP? Yes Did it involve sudden or severe rash/hives, skin peeling, or any reaction on the inside of your mouth or nose?  Yes Did you need to seek medical attention at a hospital or doctor's office? Yes When did it last happen? More than 15 years ago If all above answers are "NO", may proceed with cephalosporin use.     Social History   Socioeconomic History   Marital status: Married  Spouse name: Not on file   Number of children: Not on file   Years of education: Not on file   Highest education level: Not on file  Occupational History   Not on file  Tobacco Use   Smoking status: Every Day    Packs/day: 1.00    Years: 55.00    Total pack years: 55.00    Types: Cigarettes   Smokeless tobacco: Never  Vaping Use   Vaping Use: Never used  Substance and Sexual Activity   Alcohol use: No   Drug use: No   Sexual activity: Not on file  Other Topics Concern   Not on file  Social History Narrative   Not on file   Social Determinants of Health   Financial Resource Strain: Not on file  Food Insecurity: No Food Insecurity (09/21/2022)   Hunger Vital Sign    Worried About Running Out of Food in the Last Year: Never true    Ran Out of Food in the Last Year: Never true  Transportation Needs: No Transportation Needs (09/21/2022)   PRAPARE - Hydrologist (Medical): No    Lack of Transportation (Non-Medical): No  Physical Activity: Not on file  Stress: Not on file  Social Connections: Not on file  Intimate Partner Violence: Not At Risk (09/21/2022)   Humiliation, Afraid, Rape, and Kick questionnaire    Fear of Current or Ex-Partner: No    Emotionally Abused: No    Physically Abused: No    Sexually Abused: No     Review of Systems: All other systems reviewed and are otherwise negative except as noted above.  Physical Exam: There were no vitals filed for this visit.  GEN- The patient is well appearing, alert and oriented x 3 today.   HEENT: normocephalic, atraumatic; sclera clear, conjunctiva pink; hearing intact; oropharynx clear; neck supple, no JVP Lymph- no  cervical lymphadenopathy Lungs- Clear to ausculation bilaterally, normal work of breathing.  No wheezes, rales, rhonchi Heart- {Blank single:19197::"Regular","Irregularly irregular"} rate and rhythm, no murmurs, rubs or gallops, PMI not laterally displaced GI- soft, non-tender, non-distended, bowel sounds present, no hepatosplenomegaly Extremities- {EDEMA VZDGL:87564} peripheral edema. no clubbing or cyanosis; DP/PT/radial pulses 2+ bilaterally MS- no significant deformity or atrophy Skin- warm and dry, no rash or lesion Psych- euthymic mood, full affect Neuro- strength and sensation are intact  EKG is not ordered. Personal review of EKG from  09/21/2022  showed NSR at 71 bpm  Additional studies reviewed include: Previous EP notes.   Assessment and Plan:  Cryptogenic stroke   Hypertension   Paroxysmal atrial fibrillation   Sinus bradycardia-resolved   PFO-Amplatzer closure   Tobacco abuse   Aggravation with stroke residual   Hyperlipidemia   Renal insufficiecny    ***   Continue eliquis 2.5 mg BID    Regular on exam today, NSR in hospital. Previously stopped amiodarone because of nausea  Follow up with {EPMDS:28135} in {EPFOLLOW UP:28173}  Shirley Friar, PA-C  09/22/22 12:06 PM

## 2022-09-26 ENCOUNTER — Ambulatory Visit: Payer: Medicare HMO | Admitting: Student

## 2022-10-05 DIAGNOSIS — R42 Dizziness and giddiness: Secondary | ICD-10-CM | POA: Diagnosis not present

## 2022-10-06 DIAGNOSIS — I129 Hypertensive chronic kidney disease with stage 1 through stage 4 chronic kidney disease, or unspecified chronic kidney disease: Secondary | ICD-10-CM | POA: Diagnosis not present

## 2022-10-06 DIAGNOSIS — R200 Anesthesia of skin: Secondary | ICD-10-CM | POA: Diagnosis not present

## 2022-10-06 DIAGNOSIS — I69398 Other sequelae of cerebral infarction: Secondary | ICD-10-CM | POA: Diagnosis not present

## 2022-10-06 DIAGNOSIS — H53459 Other localized visual field defect, unspecified eye: Secondary | ICD-10-CM | POA: Diagnosis not present

## 2022-10-06 DIAGNOSIS — I480 Paroxysmal atrial fibrillation: Secondary | ICD-10-CM | POA: Diagnosis not present

## 2022-10-06 DIAGNOSIS — N1832 Chronic kidney disease, stage 3b: Secondary | ICD-10-CM | POA: Diagnosis not present

## 2022-10-06 DIAGNOSIS — E1122 Type 2 diabetes mellitus with diabetic chronic kidney disease: Secondary | ICD-10-CM | POA: Diagnosis not present

## 2022-10-10 DIAGNOSIS — E1122 Type 2 diabetes mellitus with diabetic chronic kidney disease: Secondary | ICD-10-CM | POA: Diagnosis not present

## 2022-10-10 DIAGNOSIS — I700 Atherosclerosis of aorta: Secondary | ICD-10-CM | POA: Diagnosis not present

## 2022-10-10 DIAGNOSIS — D6869 Other thrombophilia: Secondary | ICD-10-CM | POA: Diagnosis not present

## 2022-10-10 DIAGNOSIS — E1151 Type 2 diabetes mellitus with diabetic peripheral angiopathy without gangrene: Secondary | ICD-10-CM | POA: Diagnosis not present

## 2022-10-10 DIAGNOSIS — M810 Age-related osteoporosis without current pathological fracture: Secondary | ICD-10-CM | POA: Diagnosis not present

## 2022-10-10 DIAGNOSIS — I69354 Hemiplegia and hemiparesis following cerebral infarction affecting left non-dominant side: Secondary | ICD-10-CM | POA: Diagnosis not present

## 2022-10-10 DIAGNOSIS — F339 Major depressive disorder, recurrent, unspecified: Secondary | ICD-10-CM | POA: Diagnosis not present

## 2022-10-10 DIAGNOSIS — N1832 Chronic kidney disease, stage 3b: Secondary | ICD-10-CM | POA: Diagnosis not present

## 2022-10-10 DIAGNOSIS — I129 Hypertensive chronic kidney disease with stage 1 through stage 4 chronic kidney disease, or unspecified chronic kidney disease: Secondary | ICD-10-CM | POA: Diagnosis not present

## 2022-10-10 DIAGNOSIS — Z8673 Personal history of transient ischemic attack (TIA), and cerebral infarction without residual deficits: Secondary | ICD-10-CM | POA: Diagnosis not present

## 2022-10-10 DIAGNOSIS — I480 Paroxysmal atrial fibrillation: Secondary | ICD-10-CM | POA: Diagnosis not present

## 2022-10-12 ENCOUNTER — Other Ambulatory Visit: Payer: Medicare HMO | Admitting: Student

## 2022-10-24 DIAGNOSIS — M810 Age-related osteoporosis without current pathological fracture: Secondary | ICD-10-CM | POA: Diagnosis not present

## 2022-11-07 NOTE — Progress Notes (Deleted)
PCP:  Adin Hector, MD Primary Cardiologist: Virl Axe, MD Electrophysiologist: Virl Axe, MD   Erin Yates is a 77 y.o. female seen today for Virl Axe, MD for routine electrophysiology followup. Since last being seen in our clinic the patient reports doing ***.  she denies chest pain, palpitations, dyspnea, PND, orthopnea, nausea, vomiting, dizziness, syncope, edema, weight gain, or early satiety.   Past Medical History:  Diagnosis Date   Acute renal failure superimposed on stage 3b chronic kidney disease ([place]) 10/12/2020   Anemia    past  hx   Anxiety    Confusion 05/28/2022   Depression    Diabetes mellitus without complication ([place])    Dysrhythmia    prior to PFO correction   GERD (gastroesophageal reflux disease)    Headache    before PFO closure   Hypercholesteremia    Hypertension    Hypothyroidism    Peripheral vision loss    residual from CVA   PFO (patent foramen ovale)    correction device placed 2010   Right leg numbness    lower leg- residual from CVA   Seasonal allergies    Stroke [facility])    prior to PFO correction   Past Surgical History:  Procedure Laterality Date   ABDOMINAL HYSTERECTOMY     BUBBLE STUDY  02/06/2020   Procedure: BUBBLE STUDY;  Surgeon: Sanda Klein, MD;  Location: [place];  Service: Cardiovascular;;   CARDIAC CATHETERIZATION     [facility]   CATARACT EXTRACTION W/PHACO Right 09/01/2015   Procedure: CATARACT EXTRACTION PHACO AND INTRAOCULAR LENS PLACEMENT ([place]);  Surgeon: Leandrew Koyanagi, MD;  Location: [place];  Service: Ophthalmology;  Laterality: Right;  DIABETIC - oral meds   CATARACT EXTRACTION W/PHACO Left 10/20/2015   Procedure: CATARACT EXTRACTION PHACO AND INTRAOCULAR LENS PLACEMENT (IOC);  Surgeon: Leandrew Koyanagi, MD;  Location: [place];  Service: Ophthalmology;  Laterality: Left;  DIABETIC - oral meds   EP IMPLANTABLE DEVICE N/A 04/04/2016   Procedure: Loop Recorder Insertion;   Surgeon: Deboraha Sprang, MD;  Location: [place] CV [facility];  Service: Cardiovascular;  Laterality: N/A;   FOOT NEUROMA SURGERY Left    IR CT HEAD LTD  10/02/2020   IR PERCUTANEOUS ART THROMBECTOMY/INFUSION INTRACRANIAL INC DIAG ANGIO  10/02/2020   IR US GUIDE VASC ACCESS RIGHT  10/02/2020   NASAL SINUS SURGERY     PATENT FORAMEN OVALE CLOSURE     PATENT FORAMEN OVALE CLOSURE     RADIOLOGY WITH ANESTHESIA N/A 10/02/2020   Procedure: IR WITH ANESTHESIA;  Surgeon: Luanne Bras, MD;  Location: [place];  Service: Radiology;  Laterality: N/A;   TEE WITHOUT CARDIOVERSION N/A 02/09/2016   Procedure: TRANSESOPHAGEAL ECHOCARDIOGRAM (TEE);  Surgeon: Adrian Prows, MD;  Location: [place];  Service: Cardiovascular;  Laterality: N/A;   TEE WITHOUT CARDIOVERSION N/A 11/12/2019   Procedure: TRANSESOPHAGEAL ECHOCARDIOGRAM (TEE);  Surgeon: Minna Merritts, MD;  Location: [facility];  Service: Cardiovascular;  Laterality: N/A;   TEE WITHOUT CARDIOVERSION N/A 02/06/2020   Procedure: TRANSESOPHAGEAL ECHOCARDIOGRAM (TEE);  Surgeon: Sanda Klein, MD;  [facility];  Service: Cardiovascular;  Laterality: N/A;    Current Outpatient Medications  Medication Sig Dispense Refill   albuterol (VENTOLIN HFA) 108 (90 Base) MCG/ACT inhaler Inhale 2 puffs into the lungs every 6 (six) hours as needed for wheezing or shortness of breath. 8 g 0   ALPRAZolam (XANAX) 0.25 MG tablet Take 1 tablet (0.25 mg total) by mouth 2 (two)  times daily as needed for anxiety. 30 tablet 0   alum & mag hydroxide-simeth (MAALOX/MYLANTA) 200-200-20 MG/5ML suspension Take 15 mLs by mouth every 4 (four) hours as needed for indigestion or heartburn. 355 mL 0   apixaban (ELIQUIS) 2.5 MG TABS tablet Take 1 tablet (2.5 mg total) by mouth 2 (two) times daily. 60 tablet 5   atorvastatin (LIPITOR) 80 MG tablet Take 1 tablet (80 mg total) by mouth daily. 30 tablet 5   buPROPion (WELLBUTRIN SR) 150 MG 12 hr tablet Take 1 tablet (150 mg total) by  mouth 2 (two) times daily. (Patient taking differently: Take 150-300 mg by mouth 2 (two) times daily. Take 300 mg every morning and 150 every evening) 60 tablet 5   butalbital-acetaminophen-caffeine (FIORICET) 50-325-40 MG tablet Take 1 tablet by mouth every 6 (six) hours as needed for headache. 14 tablet 0   cholecalciferol (VITAMIN D3) 25 MCG (1000 UT) tablet Take 1,000 Units by mouth daily.     desvenlafaxine (PRISTIQ) 100 MG 24 hr tablet Take 100 mg by mouth daily.     ferrous sulfate 325 (65 FE) MG tablet Take 325 mg by mouth every evening.     fluticasone (FLONASE) 50 MCG/ACT nasal spray Place into both nostrils daily as needed for allergies or rhinitis.     hydrOXYzine (ATARAX) 25 MG tablet Take 25 mg by mouth 3 (three) times daily as needed.     meclizine (ANTIVERT) 25 MG tablet Take 1 tablet (25 mg total) by mouth 3 (three) times daily as needed for dizziness. 30 tablet 0   nicotine (NICODERM CQ - DOSED IN MG/24 HOURS) 21 mg/24hr patch Place 1 patch (21 mg total) onto the skin daily. 28 patch 0   pantoprazole (PROTONIX) 40 MG tablet Take 40 mg by mouth 2 (two) times daily. Reported on 04/17/2016     traZODone (DESYREL) 50 MG tablet Take 50 mg by mouth at bedtime as needed for sleep.      vitamin B-12 (CYANOCOBALAMIN) 500 MCG tablet Take 500 mcg by mouth every evening.      No current facility-administered medications for this visit.    Allergies  Allergen Reactions   Alendronate Other (See Comments)    Rash    Penicillins Rash    Also swelling and itching  Did it involve swelling of the face/tongue/throat, SOB, or low BP? Yes Did it involve sudden or severe rash/hives, skin peeling, or any reaction on the inside of your mouth or nose? Yes Did you need to seek medical attention at a hospital or doctor's office? Yes When did it last happen? More than 15 years ago If all above answers are "NO", may proceed with cephalosporin use.     Social History   Socioeconomic History    Marital status: Married    Spouse name: Not on file   Number of children: Not on file   Years of education: Not on file   Highest education level: Not on file  Occupational History   Not on file  Tobacco Use   Smoking status: Every Day    Packs/day: 1.00    Years: 55.00    Total pack years: 55.00    Types: Cigarettes   Smokeless tobacco: Never  Vaping Use   Vaping Use: Never used  Substance and Sexual Activity   Alcohol use: No   Drug use: No   Sexual activity: Not on file  Other Topics Concern   Not on file  Social History Narrative  Not on file   Social Determinants of Health   Financial Resource Strain: Not on file  Food Insecurity: No Food Insecurity (09/21/2022)   Hunger Vital Sign    Worried About Running Out of Food in the Last Year: Never true    Ran Out of Food in the Last Year: Never true  Transportation Needs: No Transportation Needs (09/21/2022)   PRAPARE - Hydrologist (Medical): No    Lack of Transportation (Non-Medical): No  Physical Activity: Not on file  Stress: Not on file  Social Connections: Not on file  Intimate Partner Violence: Not At Risk (09/21/2022)   Humiliation, Afraid, Rape, and Kick questionnaire    Fear of Current or Ex-Partner: No    Emotionally Abused: No    Physically Abused: No    Sexually Abused: No     Review of Systems: All other systems reviewed and are otherwise negative except as noted above.  Physical Exam: There were no vitals filed for this visit.  GEN- The patient is well appearing, alert and oriented x 3 today.   HEENT: normocephalic, atraumatic; sclera clear, conjunctiva pink; hearing intact; oropharynx clear; neck supple, no JVP Lymph- no cervical lymphadenopathy Lungs- Clear to ausculation bilaterally, normal work of breathing.  No wheezes, rales, rhonchi Heart- {Blank single:19197::"Regular","Irregularly irregular"} rate and rhythm, no murmurs, rubs or gallops, PMI not laterally  displaced GI- soft, non-tender, non-distended, bowel sounds present, no hepatosplenomegaly Extremities- {EDEMA ZJQBH:41937} peripheral edema. no clubbing or cyanosis; DP/PT/radial pulses 2+ bilaterally MS- no significant deformity or atrophy Skin- warm and dry, no rash or lesion Psych- euthymic mood, full affect Neuro- strength and sensation are intact  EKG is ordered. Personal review of EKG from today shows ***  Additional studies reviewed include: Previous EP notes.   Assessment and Plan:  Cryptogenic stroke   Hypertension   Paroxysmal atrial fibrillation   Sinus bradycardia-resolved   PFO-Amplatzer closure   Tobacco abuse   Aggravation with stroke residual   Hyperlipidemia   Renal insufficiency  Overall doing well s/p CVA  She is back on Eliquis and tolerating it without bleeding. Continue 2.5 mg BID  Follow up with Dr. [name] in 6 months  Shirley Friar, PA-C  11/07/22 11:22 AM

## 2022-11-13 ENCOUNTER — Ambulatory Visit: Payer: Medicare HMO | Admitting: Student

## 2022-11-13 DIAGNOSIS — I639 Cerebral infarction, unspecified: Secondary | ICD-10-CM

## 2022-11-13 DIAGNOSIS — I480 Paroxysmal atrial fibrillation: Secondary | ICD-10-CM

## 2022-11-13 DIAGNOSIS — I10 Essential (primary) hypertension: Secondary | ICD-10-CM

## 2022-11-13 DIAGNOSIS — R001 Bradycardia, unspecified: Secondary | ICD-10-CM

## 2022-11-21 DIAGNOSIS — E611 Iron deficiency: Secondary | ICD-10-CM | POA: Diagnosis not present

## 2022-11-21 DIAGNOSIS — I69354 Hemiplegia and hemiparesis following cerebral infarction affecting left non-dominant side: Secondary | ICD-10-CM | POA: Diagnosis not present

## 2022-11-21 DIAGNOSIS — E034 Atrophy of thyroid (acquired): Secondary | ICD-10-CM | POA: Diagnosis not present

## 2022-11-21 DIAGNOSIS — R809 Proteinuria, unspecified: Secondary | ICD-10-CM | POA: Diagnosis not present

## 2022-11-21 DIAGNOSIS — E7849 Other hyperlipidemia: Secondary | ICD-10-CM | POA: Diagnosis not present

## 2022-11-21 DIAGNOSIS — D519 Vitamin B12 deficiency anemia, unspecified: Secondary | ICD-10-CM | POA: Diagnosis not present

## 2022-11-21 DIAGNOSIS — E1129 Type 2 diabetes mellitus with other diabetic kidney complication: Secondary | ICD-10-CM | POA: Diagnosis not present

## 2022-11-27 DIAGNOSIS — E611 Iron deficiency: Secondary | ICD-10-CM | POA: Insufficient documentation

## 2022-11-28 DIAGNOSIS — K5900 Constipation, unspecified: Secondary | ICD-10-CM | POA: Diagnosis not present

## 2022-11-28 DIAGNOSIS — N2581 Secondary hyperparathyroidism of renal origin: Secondary | ICD-10-CM | POA: Insufficient documentation

## 2022-11-28 DIAGNOSIS — Z1211 Encounter for screening for malignant neoplasm of colon: Secondary | ICD-10-CM | POA: Diagnosis not present

## 2022-11-28 DIAGNOSIS — E611 Iron deficiency: Secondary | ICD-10-CM | POA: Diagnosis not present

## 2022-11-28 DIAGNOSIS — F17210 Nicotine dependence, cigarettes, uncomplicated: Secondary | ICD-10-CM | POA: Diagnosis not present

## 2022-11-28 DIAGNOSIS — Z0000 Encounter for general adult medical examination without abnormal findings: Secondary | ICD-10-CM | POA: Diagnosis not present

## 2022-12-01 ENCOUNTER — Telehealth: Payer: Self-pay | Admitting: *Deleted

## 2022-12-01 DIAGNOSIS — K5900 Constipation, unspecified: Secondary | ICD-10-CM | POA: Diagnosis not present

## 2022-12-01 DIAGNOSIS — F17210 Nicotine dependence, cigarettes, uncomplicated: Secondary | ICD-10-CM | POA: Diagnosis not present

## 2022-12-01 DIAGNOSIS — Z1211 Encounter for screening for malignant neoplasm of colon: Secondary | ICD-10-CM | POA: Diagnosis not present

## 2022-12-01 DIAGNOSIS — E611 Iron deficiency: Secondary | ICD-10-CM | POA: Diagnosis not present

## 2022-12-01 DIAGNOSIS — Z0000 Encounter for general adult medical examination without abnormal findings: Secondary | ICD-10-CM | POA: Diagnosis not present

## 2022-12-01 NOTE — Telephone Encounter (Signed)
Called the house of this pt. And the husband answered and I wanted to introduce myself- a member or the Dr. Janese Banks team. Made sure to let him know that she is coming in for anemia and we do treat hematology here and the patient does not have cancer. He already knew the date and time and he has been to cancer center about 4 or 5 years ago and he remembers where it is. He knows about the mask requirement and the valet, and he will be the plus 1 when she comes [place]

## 2022-12-04 ENCOUNTER — Inpatient Hospital Stay: Payer: Medicare HMO

## 2022-12-04 ENCOUNTER — Inpatient Hospital Stay: Payer: Medicare HMO | Attending: Oncology | Admitting: Oncology

## 2022-12-04 ENCOUNTER — Encounter: Payer: Self-pay | Admitting: Oncology

## 2022-12-04 VITALS — BP 138/64 | HR 68 | Temp 98.1°F | Resp 16 | Ht 63.0 in | Wt 120.8 lb

## 2022-12-04 DIAGNOSIS — G5760 Lesion of plantar nerve, unspecified lower limb: Secondary | ICD-10-CM | POA: Insufficient documentation

## 2022-12-04 DIAGNOSIS — I480 Paroxysmal atrial fibrillation: Secondary | ICD-10-CM | POA: Diagnosis not present

## 2022-12-04 DIAGNOSIS — E1122 Type 2 diabetes mellitus with diabetic chronic kidney disease: Secondary | ICD-10-CM | POA: Diagnosis not present

## 2022-12-04 DIAGNOSIS — M21379 Foot drop, unspecified foot: Secondary | ICD-10-CM | POA: Insufficient documentation

## 2022-12-04 DIAGNOSIS — D509 Iron deficiency anemia, unspecified: Secondary | ICD-10-CM | POA: Insufficient documentation

## 2022-12-04 DIAGNOSIS — Z8774 Personal history of (corrected) congenital malformations of heart and circulatory system: Secondary | ICD-10-CM | POA: Insufficient documentation

## 2022-12-04 DIAGNOSIS — D649 Anemia, unspecified: Secondary | ICD-10-CM

## 2022-12-04 DIAGNOSIS — E034 Atrophy of thyroid (acquired): Secondary | ICD-10-CM | POA: Insufficient documentation

## 2022-12-04 DIAGNOSIS — Z7901 Long term (current) use of anticoagulants: Secondary | ICD-10-CM | POA: Diagnosis not present

## 2022-12-04 DIAGNOSIS — Z79899 Other long term (current) drug therapy: Secondary | ICD-10-CM | POA: Insufficient documentation

## 2022-12-04 DIAGNOSIS — N1832 Chronic kidney disease, stage 3b: Secondary | ICD-10-CM | POA: Insufficient documentation

## 2022-12-04 DIAGNOSIS — F17210 Nicotine dependence, cigarettes, uncomplicated: Secondary | ICD-10-CM | POA: Insufficient documentation

## 2022-12-04 NOTE — Progress Notes (Signed)
Hematology/Oncology Consult note [facility] [phone] [phone]  Patient Care Team: Adin Hector, MD as PCP - General (Internal Medicine) Deboraha Sprang, MD as PCP - Cardiology (Cardiology) Deboraha Sprang, MD as PCP - Electrophysiology (Cardiology)   Name of the patient: Erin Yates  [id number]  Mar 28, 1946    Reason for referral-iron deficiency anemia   Referring physician-Dr. Ramonita Lab  Date of visit: 12/04/22   History of presenting illness-patient is a 77 year old female with a past medical history significant for hypertension hyperlipidemia [name] type 2 diabetes CKD among other medical problems referred for iron deficiency anemiaRecent H&H from January 2024 showed 8.1/26.4.  White count and platelets were normal.  B12 and TSH normal.  Ferritin levels were low at 6 with an iron saturation of 12%.  Patient denies any blood loss in her stool or urine.  Hemoccult was negative.  Patient states that she has had colonoscopy over 5 years ago.  ECOG PS- 2  Pain scale- 2   Review of systems- Review of Systems  Constitutional:  Positive for malaise/fatigue. Negative for chills, fever and weight loss.  HENT:  Negative for congestion, ear discharge and nosebleeds.   Eyes:  Negative for blurred vision.  Respiratory:  Negative for cough, hemoptysis, sputum production, shortness of breath and wheezing.   Cardiovascular:  Negative for chest pain, palpitations, orthopnea and claudication.  Gastrointestinal:  Negative for abdominal pain, blood in stool, constipation, diarrhea, heartburn, melena, nausea and vomiting.  Genitourinary:  Negative for dysuria, flank pain, frequency, hematuria and urgency.  Musculoskeletal:  Negative for back pain, joint pain and myalgias.  Skin:  Negative for rash.  Neurological:  Negative for dizziness, tingling, focal weakness, seizures, weakness and headaches.  Endo/Heme/Allergies:  Does not  bruise/bleed easily.  Psychiatric/Behavioral:  Negative for depression and suicidal ideas. The patient does not have insomnia.     Allergies  Allergen Reactions   Alendronate Other (See Comments)    Rash    Penicillins Rash    Also swelling and itching  Did it involve swelling of the face/tongue/throat, SOB, or low BP? Yes Did it involve sudden or severe rash/hives, skin peeling, or any reaction on the inside of your mouth or nose? Yes Did you need to seek medical attention at a hospital or doctor's office? Yes When did it last happen? More than 15 years ago If all above answers are "NO", may proceed with cephalosporin use.     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   Secondary hyperparathyroidism ([place]) 11/28/2022   Iron deficiency 11/27/2022   Peripheral vertigo 09/22/2022   Stage 3b chronic kidney disease (CKD) ([place]) 09/21/2022   Depression with anxiety 09/21/2022   Iron deficiency anemia 09/21/2022   Acute left PCA stroke ([place]) 05/30/2022   Headache 05/30/2022   Acute CVA (cerebrovascular accident) ([place]) 05/29/2022   Ataxia 05/28/2022   Acquired thrombophilia ([place]) 06/21/2021   Hemiparesis affecting left side as late effect of stroke ([place]) 10/28/2020   History of embolic stroke 62/26/3335   Agitation 10/12/2020   Acute delirium 10/12/2020   AKI (acute kidney injury) ([place]) 10/12/2020   Aortic atherosclerosis ([place]) 10/12/2020   Elevated troponin 10/12/2020   Normocytic anemia 10/12/2020   Acute R MCA ischemic stroke (HCC) s/p clot retrieval, likely embolic d/t AF  even though on AC    DMII (diabetes mellitus, type 2) ([place])    Essential hypertension    Atrial fibrillation with RVR (HCC)    Dysphagia, post-stroke    Mitral valve disorder    AF (paroxysmal atrial fibrillation) ([place]) 11/13/2019    CVA (cerebral vascular accident) ([place]) 11/11/2019   Stroke ([place]) 11/10/2019   Type II diabetes mellitus with renal manifestations ([place]) 11/10/2019   Depression    GERD (gastroesophageal reflux disease)    Hypertension    Hypothyroidism    Hyperlipidemia LDL goal <70    Tobacco abuse    Senile purpura ([place]) 06/06/2017   Cryptogenic stroke ([place]) 03/27/2016   Stroke (cerebrum) ([place])    CVA (cerebral infarction) 02/07/2016   Recurrent major depressive disorder, in full remission ([place]) 11/25/2015   B12 deficiency 02/17/2015     Past Medical History:  Diagnosis Date   Acute renal failure superimposed on stage 3b chronic kidney disease ([place]) 10/12/2020   Anemia    past  hx   Anxiety    Confusion 05/28/2022   Depression    Diabetes mellitus without complication (HCC)    Dysrhythmia    prior to PFO correction   GERD (gastroesophageal reflux disease)    Headache    before PFO closure   Hypercholesteremia    Hypertension    Hypothyroidism    Peripheral vision loss    residual from CVA   PFO (patent foramen ovale)    correction device placed 2010   Right leg numbness    lower leg- residual from CVA   Seasonal allergies    Stroke [facility])    prior to PFO correction     Past Surgical History:  Procedure Laterality Date   ABDOMINAL HYSTERECTOMY     BUBBLE STUDY  02/06/2020   Procedure: BUBBLE STUDY;  Surgeon: Sanda Klein, MD;  Location: [place];  Service: Cardiovascular;;   CARDIAC CATHETERIZATION     [facility]   CATARACT EXTRACTION W/PHACO Right 09/01/2015   Procedure: CATARACT EXTRACTION PHACO AND INTRAOCULAR LENS PLACEMENT ([place]);  Surgeon: Leandrew Koyanagi, MD;  Location: [place];  Service: Ophthalmology;  Laterality: Right;  DIABETIC - oral meds   CATARACT EXTRACTION W/PHACO Left 10/20/2015   Procedure: CATARACT EXTRACTION PHACO AND INTRAOCULAR LENS PLACEMENT (I[facility]);  Surgeon: Leandrew Koyanagi, MD;  Location: [place];  Service:  Ophthalmology;  Laterality: Left;  DIABETIC - oral meds   EP IMPLANTABLE DEVICE N/A 04/04/2016   Procedure: Loop Recorder Insertion;  Surgeon: Deboraha Sprang, MD;  Location: [place] CV LAB;  Service: Cardiovascular;  Laterality: N/A;   FOOT NEUROMA SURGERY Left    IR CT HEAD LTD  10/02/2020   IR PERCUTANEOUS ART THROMBECTOMY/INFUSION INTRACRANIAL INC DIAG ANGIO  10/02/2020   IR US GUIDE VASC ACCESS RIGHT  10/02/2020   NASAL SINUS SURGERY     PATENT FORAMEN OVALE CLOSURE     PATENT FORAMEN OVALE CLOSURE     RADIOLOGY WITH ANESTHESIA N/A 10/02/2020   Procedure: IR WITH ANESTHESIA;  Surgeon: Luanne Bras, MD;  Location: [place];  Service: Radiology;  Laterality: N/A;   TEE WITHOUT CARDIOVERSION N/A 02/09/2016   Procedure: TRANSESOPHAGEAL ECHOCARDIOGRAM (TEE);  Surgeon: Adrian Prows, MD;  Location: [place];  Service: Cardiovascular;  Laterality: N/A;   TEE WITHOUT CARDIOVERSION N/A 11/12/2019   Procedure: TRANSESOPHAGEAL ECHOCARDIOGRAM (TEE);  Surgeon: Minna Merritts, MD;  Location: [facility];  Service: Cardiovascular;  Laterality: N/A;   TEE WITHOUT CARDIOVERSION N/A 02/06/2020   Procedure:  TRANSESOPHAGEAL ECHOCARDIOGRAM (TEE);  Surgeon: Sanda Klein, MD;  [facility]: [facility];  Service: Cardiovascular;  Laterality: N/A;    Social History   Socioeconomic History   Marital status: Married    Spouse name: Not on file   Number of children: Not on file   Years of education: Not on file   Highest education level: Not on file  Occupational History   Not on file  Tobacco Use   Smoking status: Every Day    Packs/day: 1.00    Years: 55.00    Total pack years: 55.00    Types: Cigarettes   Smokeless tobacco: Never  Vaping Use   Vaping Use: Never used  Substance and Sexual Activity   Alcohol use: No   Drug use: No   Sexual activity: Not Currently  Other Topics Concern   Not on file  Social History Narrative   Not on file   Social Determinants of Health   Financial  Resource Strain: Not on file  Food Insecurity: No Food Insecurity (12/04/2022)   Hunger Vital Sign    Worried About Running Out of Food in the Last Year: Never true    Ran Out of Food in the Last Year: Never true  Transportation Needs: No Transportation Needs (12/04/2022)   PRAPARE - Hydrologist (Medical): No    Lack of Transportation (Non-Medical): No  Physical Activity: Not on file  Stress: Not on file  Social Connections: Not on file  Intimate Partner Violence: Not At Risk (12/04/2022)   Humiliation, Afraid, Rape, and Kick questionnaire    Fear of Current or Ex-Partner: No    Emotionally Abused: No    Physically Abused: No    Sexually Abused: No     Family History  Problem Relation Age of Onset   Hypertension Mother    Diabetes Mother    Dementia Father    Breast cancer Sister 66     Current Outpatient Medications:    ALPRAZolam (XANAX) 0.25 MG tablet, Take 1 tablet (0.25 mg total) by mouth 2 (two) times daily as needed for anxiety., Disp: 30 tablet, Rfl: 0   apixaban (ELIQUIS) 2.5 MG TABS tablet, Take 1 tablet (2.5 mg total) by mouth 2 (two) times daily., Disp: 60 tablet, Rfl: 5   atorvastatin (LIPITOR) 40 MG tablet, Take 40 mg by mouth daily., Disp: , Rfl:    buPROPion (WELLBUTRIN SR) 150 MG 12 hr tablet, Take 1 tablet (150 mg total) by mouth 2 (two) times daily. (Patient taking differently: Take 150-300 mg by mouth 2 (two) times daily. Take 300 mg every morning and 150 every evening), Disp: 60 tablet, Rfl: 5   butalbital-acetaminophen-caffeine (FIORICET) 50-325-40 MG tablet, Take 1 tablet by mouth every 6 (six) hours as needed for headache., Disp: 14 tablet, Rfl: 0   Cholecalciferol (VITAMIN D3) 1.25 MG (50000 UT) CAPS, Take 1 capsule by mouth once a week., Disp: , Rfl:    desvenlafaxine (PRISTIQ) 100 MG 24 hr tablet, Take 100 mg by mouth daily., Disp: , Rfl:    ferrous sulfate 325 (65 FE) MG tablet, Take 325 mg by mouth every evening., Disp: ,  Rfl:    fluticasone (FLONASE) 50 MCG/ACT nasal spray, Place into both nostrils daily as needed for allergies or rhinitis., Disp: , Rfl:    hydrOXYzine (ATARAX) 25 MG tablet, Take 25 mg by mouth 3 (three) times daily as needed., Disp: , Rfl:    levothyroxine (SYNTHROID) 50 MCG tablet, Take 50 mcg  by mouth daily before breakfast., Disp: , Rfl:    meclizine (ANTIVERT) 25 MG tablet, Take 1 tablet (25 mg total) by mouth 3 (three) times daily as needed for dizziness., Disp: 30 tablet, Rfl: 0   pantoprazole (PROTONIX) 40 MG tablet, Take 40 mg by mouth 2 (two) times daily. Reported on 04/17/2016, Disp: , Rfl:    traZODone (DESYREL) 50 MG tablet, Take 50 mg by mouth at bedtime as needed for sleep. , Disp: , Rfl:    vitamin B-12 (CYANOCOBALAMIN) 500 MCG tablet, Take 500 mcg by mouth every evening. , Disp: , Rfl:    albuterol (VENTOLIN HFA) 108 (90 Base) MCG/ACT inhaler, Inhale 2 puffs into the lungs every 6 (six) hours as needed for wheezing or shortness of breath., Disp: 8 g, Rfl: 0   nicotine (NICODERM CQ - DOSED IN MG/24 HOURS) 21 mg/24hr patch, Place 1 patch (21 mg total) onto the skin daily. (Patient not taking: Reported on 12/04/2022), Disp: 28 patch, Rfl: 0   Physical exam:  Vitals:   12/04/22 1148  BP: 138/64  Pulse: 68  Resp: 16  Temp: 98.1 F (36.7 C)  TempSrc: Oral  Weight: 120 lb 12.8 oz (54.8 kg)  Height: 5\' 3"  (1.6 m)   Physical Exam Constitutional:      Comments: Elderly frail woman sitting in a wheelchair.  Appears fatigued  Cardiovascular:     Rate and Rhythm: Normal rate. Rhythm irregular.     Heart sounds: Normal heart sounds.  Pulmonary:     Effort: Pulmonary effort is normal.     Breath sounds: Normal breath sounds.  Abdominal:     General: Bowel sounds are normal.     Palpations: Abdomen is soft.  Skin:    General: Skin is warm and dry.  Neurological:     Mental Status: She is alert and oriented to person, place, and time.           Latest Ref Rng & Units  09/21/2022    8:04 AM  CMP  Glucose 70 - 99 mg/dL 190   BUN 8 - 23 mg/dL 17   Creatinine 0.44 - 1.00 mg/dL 1.64   Sodium 135 - 145 mmol/L 140   Potassium 3.5 - 5.1 mmol/L 4.1   Chloride 98 - 111 mmol/L 106   CO2 22 - 32 mmol/L 25   Calcium 8.9 - 10.3 mg/dL 9.3   Total Protein 6.5 - 8.1 g/dL 7.6   Total Bilirubin 0.3 - 1.2 mg/dL 0.4   Alkaline Phos 38 - 126 U/L 97   AST 15 - 41 U/L 20   ALT 0 - 44 U/L 16       Latest Ref Rng & Units 09/21/2022    8:04 AM  CBC  WBC 4.0 - 10.5 K/uL 7.8   Hemoglobin 12.0 - 15.0 g/dL 11.4   Hematocrit 36.0 - 46.0 % 33.6   Platelets 150 - 400 K/uL 275     Assessment and plan- Patient is a 77 y.o. female referred for iron deficiency anemia   patient's hemoglobin was 11.4 in November 2023 and dropped down to 8.8 2 weeks ago.  Labs are suggestive of iron deficiency.  I recommend IV iron for her.  Discussed risks and benefits of IV iron including all but not limited to possible risk of infusion reactions.  Patient understands and agrees to proceed as planned.  Will schedule her for Venofer x 5 doses.  With regards to etiology of iron deficiency anemia: Patient had  a colonoscopy over 5 years ago.  Negative stool occult test does not rule out GI bleeding and ideally she needs to see GI to a certain etiology of iron deficiency anemia.  Moreover patient is on Eliquis as well.  Have asked her to discuss this with Dr. [name] and decide about GI referral accordingly.  She sees him tomorrow  No labs today.  CBC ferritin and iron studies in 3 and 6 months and I will see her in 6 months   Thank you for this kind referral and the opportunity to participate in the care of this patient   Visit Diagnosis 1. Symptomatic anemia   2. Iron deficiency anemia, unspecified iron deficiency anemia type     Dr. Randa Evens, MD, MPH [facility] at [facility] [phone] 12/04/2022

## 2022-12-04 NOTE — Progress Notes (Signed)
Pt is worn out, no energy- she is here for IDA

## 2022-12-05 DIAGNOSIS — E1151 Type 2 diabetes mellitus with diabetic peripheral angiopathy without gangrene: Secondary | ICD-10-CM | POA: Diagnosis not present

## 2022-12-05 DIAGNOSIS — M439 Deforming dorsopathy, unspecified: Secondary | ICD-10-CM | POA: Diagnosis not present

## 2022-12-05 DIAGNOSIS — M2578 Osteophyte, vertebrae: Secondary | ICD-10-CM | POA: Diagnosis not present

## 2022-12-05 DIAGNOSIS — I480 Paroxysmal atrial fibrillation: Secondary | ICD-10-CM | POA: Diagnosis not present

## 2022-12-05 DIAGNOSIS — E785 Hyperlipidemia, unspecified: Secondary | ICD-10-CM | POA: Diagnosis not present

## 2022-12-05 DIAGNOSIS — I700 Atherosclerosis of aorta: Secondary | ICD-10-CM | POA: Diagnosis not present

## 2022-12-05 DIAGNOSIS — N2581 Secondary hyperparathyroidism of renal origin: Secondary | ICD-10-CM | POA: Diagnosis not present

## 2022-12-05 DIAGNOSIS — M533 Sacrococcygeal disorders, not elsewhere classified: Secondary | ICD-10-CM | POA: Diagnosis not present

## 2022-12-05 DIAGNOSIS — E039 Hypothyroidism, unspecified: Secondary | ICD-10-CM | POA: Diagnosis not present

## 2022-12-05 DIAGNOSIS — M545 Low back pain, unspecified: Secondary | ICD-10-CM | POA: Diagnosis not present

## 2022-12-05 DIAGNOSIS — D509 Iron deficiency anemia, unspecified: Secondary | ICD-10-CM | POA: Diagnosis not present

## 2022-12-05 DIAGNOSIS — I129 Hypertensive chronic kidney disease with stage 1 through stage 4 chronic kidney disease, or unspecified chronic kidney disease: Secondary | ICD-10-CM | POA: Diagnosis not present

## 2022-12-05 DIAGNOSIS — I739 Peripheral vascular disease, unspecified: Secondary | ICD-10-CM | POA: Diagnosis not present

## 2022-12-06 ENCOUNTER — Other Ambulatory Visit: Payer: Self-pay | Admitting: Oncology

## 2022-12-06 ENCOUNTER — Telehealth: Payer: Self-pay | Admitting: *Deleted

## 2022-12-06 NOTE — Telephone Encounter (Signed)
Husband called and said that when the pt was over here, she was too tired to get IV iron and then he got the appt for when it starts and it 2/12. She is so fatigued and he wants to move appts up. I asked Lamar Sprinkles to see if there is a spot sooner and she says 2/1 at 1 pm. I called the husband and let him know about the appt and will ask to move the other appts up also. He is ok with this

## 2022-12-07 ENCOUNTER — Inpatient Hospital Stay: Payer: Medicare HMO | Attending: Oncology

## 2022-12-07 ENCOUNTER — Other Ambulatory Visit: Payer: Self-pay | Admitting: *Deleted

## 2022-12-07 VITALS — BP 144/75 | HR 69 | Temp 97.5°F | Resp 16

## 2022-12-07 DIAGNOSIS — D509 Iron deficiency anemia, unspecified: Secondary | ICD-10-CM

## 2022-12-07 DIAGNOSIS — D649 Anemia, unspecified: Secondary | ICD-10-CM

## 2022-12-07 DIAGNOSIS — N1832 Chronic kidney disease, stage 3b: Secondary | ICD-10-CM | POA: Insufficient documentation

## 2022-12-07 DIAGNOSIS — E1122 Type 2 diabetes mellitus with diabetic chronic kidney disease: Secondary | ICD-10-CM | POA: Diagnosis not present

## 2022-12-07 DIAGNOSIS — D508 Other iron deficiency anemias: Secondary | ICD-10-CM

## 2022-12-07 MED ORDER — SODIUM CHLORIDE 0.9 % IV SOLN
Freq: Once | INTRAVENOUS | Status: AC
  Filled 2022-12-07: qty 250

## 2022-12-07 MED ORDER — SODIUM CHLORIDE 0.9 % IV SOLN
200.0000 mg | INTRAVENOUS | Status: DC
  Administered 2022-12-07: 200 mg via INTRAVENOUS
  Filled 2022-12-07: qty 200

## 2022-12-07 NOTE — Patient Instructions (Signed)
Iron Sucrose Injection What is this medication? IRON SUCROSE (EYE ern SOO krose) treats low levels of iron (iron deficiency anemia) in people with kidney disease. Iron is a mineral that plays an important role in making red blood cells, which carry oxygen from your lungs to the rest of your body. This medicine may be used for other purposes; ask your health care provider or pharmacist if you have questions. COMMON BRAND NAME(S): Venofer What should I tell my care team before I take this medication? They need to know if you have any of these conditions: Anemia not caused by low iron levels Heart disease High levels of iron in the blood Kidney disease Liver disease An unusual or allergic reaction to iron, other medications, foods, dyes, or preservatives Pregnant or trying to get pregnant Breastfeeding How should I use this medication? This medication is for infusion into a vein. It is given in a hospital or clinic setting. Talk to your care team about the use of this medication in children. While this medication may be prescribed for children as young as 2 years for selected conditions, precautions do apply. Overdosage: If you think you have taken too much of this medicine contact a poison control center or emergency room at once. NOTE: This medicine is only for you. Do not share this medicine with others. What if I miss a dose? Keep appointments for follow-up doses. It is important not to miss your dose. Call your care team if you are unable to keep an appointment. What may interact with this medication? Do not take this medication with any of the following: Deferoxamine Dimercaprol Other iron products This medication may also interact with the following: Chloramphenicol Deferasirox This list may not describe all possible interactions. Give your health care provider a list of all the medicines, herbs, non-prescription drugs, or dietary supplements you use. Also tell them if you smoke,  drink alcohol, or use illegal drugs. Some items may interact with your medicine. What should I watch for while using this medication? Visit your care team regularly. Tell your care team if your symptoms do not start to get better or if they get worse. You may need blood work done while you are taking this medication. You may need to follow a special diet. Talk to your care team. Foods that contain iron include: whole grains/cereals, dried fruits, beans, or peas, leafy green vegetables, and organ meats (liver, kidney). What side effects may I notice from receiving this medication? Side effects that you should report to your care team as soon as possible: Allergic reactions--skin rash, itching, hives, swelling of the face, lips, tongue, or throat Low blood pressure--dizziness, feeling faint or lightheaded, blurry vision Shortness of breath Side effects that usually do not require medical attention (report to your care team if they continue or are bothersome): Flushing Headache Joint pain Muscle pain Nausea Pain, redness, or irritation at injection site This list may not describe all possible side effects. Call your doctor for medical advice about side effects. You may report side effects to FDA at 1-800-FDA-1088. Where should I keep my medication? This medication is given in a hospital or clinic and will not be stored at home. NOTE: This sheet is a summary. It may not cover all possible information. If you have questions about this medicine, talk to your doctor, pharmacist, or health care provider.  2023 Elsevier/Gold Standard (2021-02-03 00:00:00)

## 2022-12-07 NOTE — Progress Notes (Signed)
Pt here receiving first iron infusion. Reports weakness/fatigue even with rest. Pt asking about blood transfusions with hgb 7.2. Dr Janese Banks aware of patient concerns. Pt states she will see how she feels next week and decide later. Pt aware to contact office with any further concerns or questions about blood transfusion.

## 2022-12-08 ENCOUNTER — Encounter: Payer: Self-pay | Admitting: *Deleted

## 2022-12-08 ENCOUNTER — Telehealth: Payer: Self-pay | Admitting: *Deleted

## 2022-12-08 NOTE — Telephone Encounter (Signed)
I spoke to him on the phone and let him know that sometimes people can have side effects. I told him that I will send message to [facility] . She said that se can have premeds. Like nausea medicine, tylenol, benadryl. He said when they got home she got the nausea and he gave her tylenol and benadryl but she vomited so she probably did not even get the med to help. Also some times for achy sometimes claritin over the counter.  He says that the pt will need to make the decision about coming for another infusion . Just let me know what she wants to do. And we can give the premeds here at [facility]. He will let me know

## 2022-12-11 ENCOUNTER — Telehealth: Payer: Self-pay

## 2022-12-11 ENCOUNTER — Inpatient Hospital Stay: Payer: Medicare HMO

## 2022-12-11 ENCOUNTER — Other Ambulatory Visit: Payer: Self-pay | Admitting: Oncology

## 2022-12-11 ENCOUNTER — Other Ambulatory Visit: Payer: Self-pay | Admitting: *Deleted

## 2022-12-11 VITALS — BP 145/84 | HR 63

## 2022-12-11 DIAGNOSIS — E1122 Type 2 diabetes mellitus with diabetic chronic kidney disease: Secondary | ICD-10-CM | POA: Diagnosis not present

## 2022-12-11 DIAGNOSIS — D649 Anemia, unspecified: Secondary | ICD-10-CM

## 2022-12-11 DIAGNOSIS — D508 Other iron deficiency anemias: Secondary | ICD-10-CM

## 2022-12-11 DIAGNOSIS — D509 Iron deficiency anemia, unspecified: Secondary | ICD-10-CM

## 2022-12-11 DIAGNOSIS — N1832 Chronic kidney disease, stage 3b: Secondary | ICD-10-CM | POA: Diagnosis not present

## 2022-12-11 LAB — CBC WITH DIFFERENTIAL/PLATELET
Abs Immature Granulocytes: 0.02 10*3/uL (ref 0.00–0.07)
Basophils Absolute: 0 10*3/uL (ref 0.0–0.1)
Basophils Relative: 1 %
Eosinophils Absolute: 0.1 10*3/uL (ref 0.0–0.5)
Eosinophils Relative: 1 %
HCT: 24.4 % — ABNORMAL LOW (ref 36.0–46.0)
Hemoglobin: 7.4 g/dL — ABNORMAL LOW (ref 12.0–15.0)
Immature Granulocytes: 0 %
Lymphocytes Relative: 34 %
Lymphs Abs: 2.3 10*3/uL (ref 0.7–4.0)
MCH: 27.6 pg (ref 26.0–34.0)
MCHC: 30.3 g/dL (ref 30.0–36.0)
MCV: 91 fL (ref 80.0–100.0)
Monocytes Absolute: 0.5 10*3/uL (ref 0.1–1.0)
Monocytes Relative: 7 %
Neutro Abs: 3.7 10*3/uL (ref 1.7–7.7)
Neutrophils Relative %: 57 %
Platelets: 293 10*3/uL (ref 150–400)
RBC: 2.68 MIL/uL — ABNORMAL LOW (ref 3.87–5.11)
RDW: 15.8 % — ABNORMAL HIGH (ref 11.5–15.5)
WBC: 6.6 10*3/uL (ref 4.0–10.5)
nRBC: 0.3 % — ABNORMAL HIGH (ref 0.0–0.2)

## 2022-12-11 LAB — SAMPLE TO BLOOD BANK

## 2022-12-11 LAB — ABO/RH: ABO/RH(D): A POS

## 2022-12-11 MED ORDER — SODIUM CHLORIDE 0.9% FLUSH
10.0000 mL | Freq: Once | INTRAVENOUS | Status: AC | PRN
  Administered 2022-12-11: 10 mL
  Filled 2022-12-11: qty 10

## 2022-12-11 MED ORDER — SODIUM CHLORIDE 0.9 % IV SOLN
200.0000 mg | INTRAVENOUS | Status: DC
  Administered 2022-12-11: 200 mg via INTRAVENOUS
  Filled 2022-12-11: qty 200

## 2022-12-11 MED ORDER — SODIUM CHLORIDE 0.9 % IV SOLN
Freq: Once | INTRAVENOUS | Status: AC
  Filled 2022-12-11: qty 250

## 2022-12-11 MED ORDER — ACETAMINOPHEN 325 MG PO TABS
650.0000 mg | ORAL_TABLET | ORAL | Status: DC
  Administered 2022-12-11: 650 mg via ORAL
  Filled 2022-12-11: qty 2

## 2022-12-11 MED ORDER — METHYLPREDNISOLONE SODIUM SUCC 125 MG IJ SOLR
40.0000 mg | INTRAMUSCULAR | Status: DC
  Administered 2022-12-11: 40 mg via INTRAVENOUS
  Filled 2022-12-11: qty 2

## 2022-12-11 MED ORDER — LORAZEPAM 0.5 MG PO TABS: 0.5000 mg | ORAL_TABLET | Freq: Once | ORAL | 0 refills | Status: DC

## 2022-12-11 MED ORDER — LORAZEPAM 2 MG/ML IJ SOLN
0.5000 mg | Freq: Once | INTRAMUSCULAR | Status: AC
  Administered 2022-12-11: 0.5 mg via INTRAVENOUS
  Filled 2022-12-11: qty 1

## 2022-12-11 NOTE — Patient Instructions (Signed)

## 2022-12-11 NOTE — Progress Notes (Signed)
Per meds: Ativan, Tylenol and Solu-MEDROL given prior to Venofer Infusion. Explained to patient to call if reaction symptoms occur. (Patient stated last transfusion fever, sneezing, headache and vomiting all occurred). Patient tolerated infusion well today, no symptoms reported. All questions answered. No further concerns voiced. Monitored 30 min post transfusion. Patient stable at discharge with husband. VSS. AVS given.

## 2022-12-11 NOTE — Progress Notes (Signed)
Patient in for Venofer infusion having extreme anxiety, crying and worried about possible reaction from last infusion. Dr Janese Banks aware and has ordered per meds. Comforted patient and explained purpose of pre-meds. All questions/ concerns addressed.  Hgb 7.4 patient advised no blood tomorrow. Still anxious about Venofer infusion. MD notifed

## 2022-12-11 NOTE — Telephone Encounter (Signed)
Per Dr. Janese Banks if patient is having reaction symptoms from Venofer when she gets home. Dr Janese Banks can send PO steroids for the next day after Venofer infusion. Patient and husband will call if symptoms are noted.

## 2022-12-12 ENCOUNTER — Inpatient Hospital Stay: Payer: Medicare HMO

## 2022-12-14 ENCOUNTER — Encounter (HOSPITAL_COMMUNITY): Payer: Self-pay | Admitting: *Deleted

## 2022-12-15 ENCOUNTER — Inpatient Hospital Stay: Payer: Medicare HMO

## 2022-12-15 VITALS — BP 138/81 | HR 69 | Temp 98.5°F | Resp 17

## 2022-12-15 DIAGNOSIS — D509 Iron deficiency anemia, unspecified: Secondary | ICD-10-CM | POA: Diagnosis not present

## 2022-12-15 DIAGNOSIS — D508 Other iron deficiency anemias: Secondary | ICD-10-CM

## 2022-12-15 DIAGNOSIS — N1832 Chronic kidney disease, stage 3b: Secondary | ICD-10-CM | POA: Diagnosis not present

## 2022-12-15 DIAGNOSIS — E1122 Type 2 diabetes mellitus with diabetic chronic kidney disease: Secondary | ICD-10-CM | POA: Diagnosis not present

## 2022-12-15 MED ORDER — METHYLPREDNISOLONE SODIUM SUCC 125 MG IJ SOLR
40.0000 mg | INTRAMUSCULAR | Status: DC
  Administered 2022-12-15: 40 mg via INTRAVENOUS
  Filled 2022-12-15: qty 2

## 2022-12-15 MED ORDER — SODIUM CHLORIDE 0.9 % IV SOLN
200.0000 mg | INTRAVENOUS | Status: DC
  Administered 2022-12-15: 200 mg via INTRAVENOUS
  Filled 2022-12-15: qty 200

## 2022-12-15 MED ORDER — SODIUM CHLORIDE 0.9 % IV SOLN
Freq: Once | INTRAVENOUS | Status: AC
  Filled 2022-12-15: qty 250

## 2022-12-15 MED ORDER — ACETAMINOPHEN 325 MG PO TABS
650.0000 mg | ORAL_TABLET | ORAL | Status: DC
  Administered 2022-12-15: 650 mg via ORAL
  Filled 2022-12-15: qty 2

## 2022-12-15 NOTE — Progress Notes (Signed)
Patient here today for dose 3 of IV venofer. Patient very anxious about getting the iron because of allergic reaction to 1st dose. Patient given premeds as ordered, Tylenol 650 mg PO and Solumedrol 40 mg IV at 1312. At 1337, patient complaining of sudden onset of pain in the top of her head at 8 (10). Dr. Janese Banks notified. No additional meds ordered or given. Per Dr. Janese Banks, patient can get her iron today or refuse it. At 1350, patient requested that I go ahead and start her iron. Head pain was down to a 5 (10). IV Venofer started at 1353. Patient monitored for 30 minutes post infusion, infusion well. Headache resolved. Patient discharged in stable condition.

## 2022-12-18 ENCOUNTER — Inpatient Hospital Stay: Payer: Medicare HMO

## 2022-12-20 ENCOUNTER — Inpatient Hospital Stay: Payer: Medicare HMO

## 2022-12-20 ENCOUNTER — Telehealth: Payer: Self-pay | Admitting: *Deleted

## 2022-12-20 VITALS — BP 140/57 | HR 69 | Temp 97.3°F | Resp 18

## 2022-12-20 DIAGNOSIS — N1832 Chronic kidney disease, stage 3b: Secondary | ICD-10-CM | POA: Diagnosis not present

## 2022-12-20 DIAGNOSIS — D508 Other iron deficiency anemias: Secondary | ICD-10-CM

## 2022-12-20 DIAGNOSIS — E1122 Type 2 diabetes mellitus with diabetic chronic kidney disease: Secondary | ICD-10-CM | POA: Diagnosis not present

## 2022-12-20 DIAGNOSIS — D509 Iron deficiency anemia, unspecified: Secondary | ICD-10-CM | POA: Diagnosis not present

## 2022-12-20 MED ORDER — METHYLPREDNISOLONE SODIUM SUCC 125 MG IJ SOLR
40.0000 mg | INTRAMUSCULAR | Status: DC
  Administered 2022-12-20: 40 mg via INTRAVENOUS
  Filled 2022-12-20: qty 2

## 2022-12-20 MED ORDER — ACETAMINOPHEN 325 MG PO TABS
650.0000 mg | ORAL_TABLET | ORAL | Status: DC
  Administered 2022-12-20: 650 mg via ORAL
  Filled 2022-12-20: qty 2

## 2022-12-20 MED ORDER — SODIUM CHLORIDE 0.9 % IV SOLN
200.0000 mg | INTRAVENOUS | Status: DC
  Administered 2022-12-20: 200 mg via INTRAVENOUS
  Filled 2022-12-20: qty 200

## 2022-12-20 MED ORDER — SODIUM CHLORIDE 0.9 % IV SOLN
Freq: Once | INTRAVENOUS | Status: AC
  Filled 2022-12-20: qty 250

## 2022-12-20 NOTE — Patient Outreach (Signed)
  Care Coordination   Initial Visit Note   12/20/2022 Name: Erin Yates MRN: [id number] DOB: [birth year]  Erin Yates is a 77 y.o. year old female who sees Adin Hector, MD for primary care. I spoke with  Husband of Erin Yates by phone today.  What matters to the patients health and wellness today?  Husband state patient is doing very good, declines the need for care coordination services but does agree to receiving information in the mail about program for future purposes.      SDOH assessments and interventions completed:  No     Care Coordination Interventions:  Yes, provided   Follow up plan: No further intervention required.   Encounter Outcome:  Pt. Visit Completed   Valente David, RN, MSN, [place] [facility] Care Management Coordinator [phone]

## 2022-12-20 NOTE — Patient Instructions (Signed)

## 2022-12-21 MED FILL — Iron Sucrose Inj 20 MG/ML (Fe Equiv): INTRAVENOUS | Qty: 10 | Status: AC

## 2022-12-22 ENCOUNTER — Inpatient Hospital Stay: Payer: Medicare HMO

## 2022-12-22 VITALS — BP 111/64 | HR 60 | Temp 98.2°F

## 2022-12-22 DIAGNOSIS — E1122 Type 2 diabetes mellitus with diabetic chronic kidney disease: Secondary | ICD-10-CM | POA: Diagnosis not present

## 2022-12-22 DIAGNOSIS — D508 Other iron deficiency anemias: Secondary | ICD-10-CM

## 2022-12-22 DIAGNOSIS — N1832 Chronic kidney disease, stage 3b: Secondary | ICD-10-CM | POA: Diagnosis not present

## 2022-12-22 DIAGNOSIS — D509 Iron deficiency anemia, unspecified: Secondary | ICD-10-CM | POA: Diagnosis not present

## 2022-12-22 MED ORDER — SODIUM CHLORIDE 0.9 % IV SOLN
200.0000 mg | INTRAVENOUS | Status: DC
  Administered 2022-12-22: 200 mg via INTRAVENOUS
  Filled 2022-12-22: qty 200

## 2022-12-22 MED ORDER — METHYLPREDNISOLONE SODIUM SUCC 125 MG IJ SOLR
40.0000 mg | INTRAMUSCULAR | Status: DC
  Administered 2022-12-22: 40 mg via INTRAVENOUS
  Filled 2022-12-22: qty 2

## 2022-12-22 MED ORDER — ACETAMINOPHEN 325 MG PO TABS
650.0000 mg | ORAL_TABLET | ORAL | Status: DC
  Administered 2022-12-22: 650 mg via ORAL
  Filled 2022-12-22: qty 2

## 2022-12-22 MED ORDER — SODIUM CHLORIDE 0.9 % IV SOLN
Freq: Once | INTRAVENOUS | Status: AC
  Filled 2022-12-22: qty 250

## 2022-12-22 NOTE — Progress Notes (Signed)
1030: Tylenol and Solu-Medrol not given prior to Venofer. Pt has had previous complications after retuning home post Venofer. Dr. Janese Banks aware, and okay to give Tylenol and Solu-medrol at this time and discharge after 30 minute post Venofer Observation.   1100: Pt and VS stable and educated to call clinic with any concerns. Pt and spouse verbalize understanding.

## 2022-12-26 ENCOUNTER — Inpatient Hospital Stay: Payer: Medicare HMO

## 2022-12-28 ENCOUNTER — Inpatient Hospital Stay: Payer: Medicare HMO

## 2022-12-29 DIAGNOSIS — F339 Major depressive disorder, recurrent, unspecified: Secondary | ICD-10-CM | POA: Diagnosis not present

## 2022-12-29 DIAGNOSIS — I129 Hypertensive chronic kidney disease with stage 1 through stage 4 chronic kidney disease, or unspecified chronic kidney disease: Secondary | ICD-10-CM | POA: Diagnosis not present

## 2022-12-29 DIAGNOSIS — E1122 Type 2 diabetes mellitus with diabetic chronic kidney disease: Secondary | ICD-10-CM | POA: Diagnosis not present

## 2022-12-29 DIAGNOSIS — M79659 Pain in unspecified thigh: Secondary | ICD-10-CM | POA: Diagnosis not present

## 2022-12-29 DIAGNOSIS — E1151 Type 2 diabetes mellitus with diabetic peripheral angiopathy without gangrene: Secondary | ICD-10-CM | POA: Diagnosis not present

## 2022-12-29 DIAGNOSIS — M7918 Myalgia, other site: Secondary | ICD-10-CM | POA: Diagnosis not present

## 2022-12-29 DIAGNOSIS — D519 Vitamin B12 deficiency anemia, unspecified: Secondary | ICD-10-CM | POA: Diagnosis not present

## 2022-12-29 DIAGNOSIS — I700 Atherosclerosis of aorta: Secondary | ICD-10-CM | POA: Diagnosis not present

## 2022-12-29 DIAGNOSIS — D631 Anemia in chronic kidney disease: Secondary | ICD-10-CM | POA: Diagnosis not present

## 2022-12-29 DIAGNOSIS — D6869 Other thrombophilia: Secondary | ICD-10-CM | POA: Diagnosis not present

## 2022-12-29 DIAGNOSIS — N1832 Chronic kidney disease, stage 3b: Secondary | ICD-10-CM | POA: Diagnosis not present

## 2022-12-29 DIAGNOSIS — I69354 Hemiplegia and hemiparesis following cerebral infarction affecting left non-dominant side: Secondary | ICD-10-CM | POA: Diagnosis not present

## 2022-12-29 DIAGNOSIS — N2581 Secondary hyperparathyroidism of renal origin: Secondary | ICD-10-CM | POA: Diagnosis not present

## 2022-12-29 DIAGNOSIS — I480 Paroxysmal atrial fibrillation: Secondary | ICD-10-CM | POA: Diagnosis not present

## 2022-12-29 DIAGNOSIS — E611 Iron deficiency: Secondary | ICD-10-CM | POA: Diagnosis not present

## 2023-01-04 ENCOUNTER — Inpatient Hospital Stay: Payer: Medicare HMO

## 2023-01-18 DIAGNOSIS — M7071 Other bursitis of hip, right hip: Secondary | ICD-10-CM | POA: Diagnosis not present

## 2023-01-22 DIAGNOSIS — E039 Hypothyroidism, unspecified: Secondary | ICD-10-CM | POA: Diagnosis not present

## 2023-01-22 DIAGNOSIS — F339 Major depressive disorder, recurrent, unspecified: Secondary | ICD-10-CM | POA: Diagnosis not present

## 2023-01-22 DIAGNOSIS — E1122 Type 2 diabetes mellitus with diabetic chronic kidney disease: Secondary | ICD-10-CM | POA: Diagnosis not present

## 2023-01-22 DIAGNOSIS — I700 Atherosclerosis of aorta: Secondary | ICD-10-CM | POA: Diagnosis not present

## 2023-01-22 DIAGNOSIS — N2581 Secondary hyperparathyroidism of renal origin: Secondary | ICD-10-CM | POA: Diagnosis not present

## 2023-01-22 DIAGNOSIS — E1151 Type 2 diabetes mellitus with diabetic peripheral angiopathy without gangrene: Secondary | ICD-10-CM | POA: Diagnosis not present

## 2023-01-22 DIAGNOSIS — I129 Hypertensive chronic kidney disease with stage 1 through stage 4 chronic kidney disease, or unspecified chronic kidney disease: Secondary | ICD-10-CM | POA: Diagnosis not present

## 2023-01-22 DIAGNOSIS — I480 Paroxysmal atrial fibrillation: Secondary | ICD-10-CM | POA: Diagnosis not present

## 2023-01-22 DIAGNOSIS — D6869 Other thrombophilia: Secondary | ICD-10-CM | POA: Diagnosis not present

## 2023-01-30 DIAGNOSIS — R42 Dizziness and giddiness: Secondary | ICD-10-CM | POA: Diagnosis not present

## 2023-01-30 DIAGNOSIS — N1832 Chronic kidney disease, stage 3b: Secondary | ICD-10-CM | POA: Diagnosis not present

## 2023-01-30 DIAGNOSIS — I700 Atherosclerosis of aorta: Secondary | ICD-10-CM | POA: Diagnosis not present

## 2023-01-30 DIAGNOSIS — I739 Peripheral vascular disease, unspecified: Secondary | ICD-10-CM | POA: Diagnosis not present

## 2023-01-30 DIAGNOSIS — I480 Paroxysmal atrial fibrillation: Secondary | ICD-10-CM | POA: Diagnosis not present

## 2023-01-30 DIAGNOSIS — N2581 Secondary hyperparathyroidism of renal origin: Secondary | ICD-10-CM | POA: Diagnosis not present

## 2023-01-30 DIAGNOSIS — E1151 Type 2 diabetes mellitus with diabetic peripheral angiopathy without gangrene: Secondary | ICD-10-CM | POA: Diagnosis not present

## 2023-01-30 DIAGNOSIS — E1122 Type 2 diabetes mellitus with diabetic chronic kidney disease: Secondary | ICD-10-CM | POA: Diagnosis not present

## 2023-01-30 DIAGNOSIS — I69354 Hemiplegia and hemiparesis following cerebral infarction affecting left non-dominant side: Secondary | ICD-10-CM | POA: Diagnosis not present

## 2023-02-01 ENCOUNTER — Other Ambulatory Visit: Payer: Self-pay | Admitting: Internal Medicine

## 2023-02-01 DIAGNOSIS — I6930 Unspecified sequelae of cerebral infarction: Secondary | ICD-10-CM

## 2023-02-01 DIAGNOSIS — R42 Dizziness and giddiness: Secondary | ICD-10-CM

## 2023-02-07 DIAGNOSIS — R748 Abnormal levels of other serum enzymes: Secondary | ICD-10-CM | POA: Diagnosis not present

## 2023-02-08 ENCOUNTER — Ambulatory Visit
Admission: RE | Admit: 2023-02-08 | Discharge: 2023-02-08 | Disposition: A | Discharge status: Home / Self Care | Payer: Medicare HMO | Source: Ambulatory Visit | Attending: Internal Medicine | Admitting: Internal Medicine

## 2023-02-08 DIAGNOSIS — I6930 Unspecified sequelae of cerebral infarction: Secondary | ICD-10-CM | POA: Diagnosis not present

## 2023-02-08 DIAGNOSIS — R42 Dizziness and giddiness: Secondary | ICD-10-CM | POA: Insufficient documentation

## 2023-03-05 ENCOUNTER — Inpatient Hospital Stay: Payer: Medicare HMO | Attending: Oncology

## 2023-03-05 DIAGNOSIS — D649 Anemia, unspecified: Secondary | ICD-10-CM

## 2023-03-05 DIAGNOSIS — D509 Iron deficiency anemia, unspecified: Secondary | ICD-10-CM | POA: Diagnosis not present

## 2023-03-05 LAB — IRON AND TIBC
Iron: 67 ug/dL (ref 28–170)
Saturation Ratios: 20 % (ref 10.4–31.8)
TIBC: 333 ug/dL (ref 250–450)
UIBC: 266 ug/dL

## 2023-03-05 LAB — CBC WITH DIFFERENTIAL/PLATELET
Abs Immature Granulocytes: 0.02 10*3/uL (ref 0.00–0.07)
Basophils Absolute: 0.1 10*3/uL (ref 0.0–0.1)
Basophils Relative: 1 %
Eosinophils Absolute: 0.2 10*3/uL (ref 0.0–0.5)
Eosinophils Relative: 2 %
HCT: 37.9 % (ref 36.0–46.0)
Hemoglobin: 12.5 g/dL (ref 12.0–15.0)
Immature Granulocytes: 0 %
Lymphocytes Relative: 35 %
Lymphs Abs: 2.6 10*3/uL (ref 0.7–4.0)
MCH: 30.6 pg (ref 26.0–34.0)
MCHC: 33 g/dL (ref 30.0–36.0)
MCV: 92.9 fL (ref 80.0–100.0)
Monocytes Absolute: 0.5 10*3/uL (ref 0.1–1.0)
Monocytes Relative: 7 %
Neutro Abs: 4.1 10*3/uL (ref 1.7–7.7)
Neutrophils Relative %: 55 %
Platelets: 198 10*3/uL (ref 150–400)
RBC: 4.08 MIL/uL (ref 3.87–5.11)
RDW: 16.8 % — ABNORMAL HIGH (ref 11.5–15.5)
WBC: 7.4 10*3/uL (ref 4.0–10.5)
nRBC: 0 % (ref 0.0–0.2)

## 2023-03-05 LAB — FERRITIN: Ferritin: 66 ng/mL (ref 11–307)

## 2023-03-07 DIAGNOSIS — E119 Type 2 diabetes mellitus without complications: Secondary | ICD-10-CM | POA: Diagnosis not present

## 2023-03-07 DIAGNOSIS — H5203 Hypermetropia, bilateral: Secondary | ICD-10-CM | POA: Diagnosis not present

## 2023-03-07 DIAGNOSIS — H52223 Regular astigmatism, bilateral: Secondary | ICD-10-CM | POA: Diagnosis not present

## 2023-03-07 DIAGNOSIS — Z961 Presence of intraocular lens: Secondary | ICD-10-CM | POA: Diagnosis not present

## 2023-03-15 DIAGNOSIS — N1832 Chronic kidney disease, stage 3b: Secondary | ICD-10-CM | POA: Diagnosis not present

## 2023-03-15 DIAGNOSIS — I69354 Hemiplegia and hemiparesis following cerebral infarction affecting left non-dominant side: Secondary | ICD-10-CM | POA: Diagnosis not present

## 2023-03-15 DIAGNOSIS — I480 Paroxysmal atrial fibrillation: Secondary | ICD-10-CM | POA: Diagnosis not present

## 2023-03-15 DIAGNOSIS — E611 Iron deficiency: Secondary | ICD-10-CM | POA: Diagnosis not present

## 2023-03-15 DIAGNOSIS — D6869 Other thrombophilia: Secondary | ICD-10-CM | POA: Diagnosis not present

## 2023-03-15 DIAGNOSIS — K219 Gastro-esophageal reflux disease without esophagitis: Secondary | ICD-10-CM | POA: Diagnosis not present

## 2023-03-15 DIAGNOSIS — I38 Endocarditis, valve unspecified: Secondary | ICD-10-CM | POA: Diagnosis not present

## 2023-03-15 DIAGNOSIS — I69320 Aphasia following cerebral infarction: Secondary | ICD-10-CM | POA: Diagnosis not present

## 2023-04-03 ENCOUNTER — Telehealth: Payer: Self-pay

## 2023-04-03 NOTE — Telephone Encounter (Signed)
(  2:03 pm) Patient's daughter, Roena Malady( [phone]) called in and requested that services be restarted. She advised that she has also reached out to Dr. Graciela Husbands to restart services also. SW forwarded request to Palliative Care Admin team for follow-up.

## 2023-04-15 DIAGNOSIS — I129 Hypertensive chronic kidney disease with stage 1 through stage 4 chronic kidney disease, or unspecified chronic kidney disease: Secondary | ICD-10-CM | POA: Diagnosis not present

## 2023-04-15 DIAGNOSIS — E785 Hyperlipidemia, unspecified: Secondary | ICD-10-CM | POA: Diagnosis not present

## 2023-04-15 DIAGNOSIS — E46 Unspecified protein-calorie malnutrition: Secondary | ICD-10-CM | POA: Diagnosis not present

## 2023-04-15 DIAGNOSIS — F0152 Vascular dementia, unspecified severity, with psychotic disturbance: Secondary | ICD-10-CM | POA: Diagnosis not present

## 2023-04-15 DIAGNOSIS — K219 Gastro-esophageal reflux disease without esophagitis: Secondary | ICD-10-CM | POA: Diagnosis not present

## 2023-04-15 DIAGNOSIS — F0153 Vascular dementia, unspecified severity, with mood disturbance: Secondary | ICD-10-CM | POA: Diagnosis not present

## 2023-04-15 DIAGNOSIS — I69818 Other symptoms and signs involving cognitive functions following other cerebrovascular disease: Secondary | ICD-10-CM | POA: Diagnosis not present

## 2023-04-19 ENCOUNTER — Institutional Professional Consult (permissible substitution): Payer: Medicare HMO | Admitting: Neurology

## 2023-04-30 DIAGNOSIS — M810 Age-related osteoporosis without current pathological fracture: Secondary | ICD-10-CM | POA: Diagnosis not present

## 2023-04-30 DIAGNOSIS — I6523 Occlusion and stenosis of bilateral carotid arteries: Secondary | ICD-10-CM | POA: Diagnosis not present

## 2023-04-30 DIAGNOSIS — R42 Dizziness and giddiness: Secondary | ICD-10-CM | POA: Diagnosis not present

## 2023-05-09 DIAGNOSIS — R809 Proteinuria, unspecified: Secondary | ICD-10-CM | POA: Diagnosis not present

## 2023-05-09 DIAGNOSIS — I10 Essential (primary) hypertension: Secondary | ICD-10-CM | POA: Diagnosis not present

## 2023-05-09 DIAGNOSIS — E785 Hyperlipidemia, unspecified: Secondary | ICD-10-CM | POA: Diagnosis not present

## 2023-05-09 DIAGNOSIS — N2581 Secondary hyperparathyroidism of renal origin: Secondary | ICD-10-CM | POA: Diagnosis not present

## 2023-05-09 DIAGNOSIS — E1129 Type 2 diabetes mellitus with other diabetic kidney complication: Secondary | ICD-10-CM | POA: Diagnosis not present

## 2023-05-09 DIAGNOSIS — E611 Iron deficiency: Secondary | ICD-10-CM | POA: Diagnosis not present

## 2023-05-09 DIAGNOSIS — E034 Atrophy of thyroid (acquired): Secondary | ICD-10-CM | POA: Diagnosis not present

## 2023-05-17 DIAGNOSIS — N2581 Secondary hyperparathyroidism of renal origin: Secondary | ICD-10-CM | POA: Diagnosis not present

## 2023-05-17 DIAGNOSIS — I129 Hypertensive chronic kidney disease with stage 1 through stage 4 chronic kidney disease, or unspecified chronic kidney disease: Secondary | ICD-10-CM | POA: Diagnosis not present

## 2023-05-17 DIAGNOSIS — E1122 Type 2 diabetes mellitus with diabetic chronic kidney disease: Secondary | ICD-10-CM | POA: Diagnosis not present

## 2023-05-17 DIAGNOSIS — I700 Atherosclerosis of aorta: Secondary | ICD-10-CM | POA: Diagnosis not present

## 2023-05-17 DIAGNOSIS — N1832 Chronic kidney disease, stage 3b: Secondary | ICD-10-CM | POA: Diagnosis not present

## 2023-05-17 DIAGNOSIS — E538 Deficiency of other specified B group vitamins: Secondary | ICD-10-CM | POA: Diagnosis not present

## 2023-05-17 DIAGNOSIS — D692 Other nonthrombocytopenic purpura: Secondary | ICD-10-CM | POA: Diagnosis not present

## 2023-05-17 DIAGNOSIS — I480 Paroxysmal atrial fibrillation: Secondary | ICD-10-CM | POA: Diagnosis not present

## 2023-05-17 DIAGNOSIS — I69354 Hemiplegia and hemiparesis following cerebral infarction affecting left non-dominant side: Secondary | ICD-10-CM | POA: Diagnosis not present

## 2023-06-04 ENCOUNTER — Inpatient Hospital Stay: Payer: Medicare HMO

## 2023-06-04 ENCOUNTER — Inpatient Hospital Stay: Payer: Medicare HMO | Admitting: Oncology

## 2023-07-11 DIAGNOSIS — D509 Iron deficiency anemia, unspecified: Secondary | ICD-10-CM | POA: Diagnosis not present

## 2023-07-11 DIAGNOSIS — E785 Hyperlipidemia, unspecified: Secondary | ICD-10-CM | POA: Diagnosis not present

## 2023-07-11 DIAGNOSIS — I4891 Unspecified atrial fibrillation: Secondary | ICD-10-CM | POA: Diagnosis not present

## 2023-07-11 DIAGNOSIS — K219 Gastro-esophageal reflux disease without esophagitis: Secondary | ICD-10-CM | POA: Diagnosis not present

## 2023-07-11 DIAGNOSIS — M4800 Spinal stenosis, site unspecified: Secondary | ICD-10-CM | POA: Diagnosis not present

## 2023-07-11 DIAGNOSIS — I69818 Other symptoms and signs involving cognitive functions following other cerebrovascular disease: Secondary | ICD-10-CM | POA: Diagnosis not present

## 2023-07-11 DIAGNOSIS — E46 Unspecified protein-calorie malnutrition: Secondary | ICD-10-CM | POA: Diagnosis not present

## 2023-08-07 DIAGNOSIS — I69818 Other symptoms and signs involving cognitive functions following other cerebrovascular disease: Secondary | ICD-10-CM | POA: Diagnosis not present

## 2023-08-07 DIAGNOSIS — K219 Gastro-esophageal reflux disease without esophagitis: Secondary | ICD-10-CM | POA: Diagnosis not present

## 2023-08-07 DIAGNOSIS — E785 Hyperlipidemia, unspecified: Secondary | ICD-10-CM | POA: Diagnosis not present

## 2023-08-07 DIAGNOSIS — F0153 Vascular dementia, unspecified severity, with mood disturbance: Secondary | ICD-10-CM | POA: Diagnosis not present

## 2023-08-07 DIAGNOSIS — F0152 Vascular dementia, unspecified severity, with psychotic disturbance: Secondary | ICD-10-CM | POA: Diagnosis not present

## 2023-08-07 DIAGNOSIS — E46 Unspecified protein-calorie malnutrition: Secondary | ICD-10-CM | POA: Diagnosis not present

## 2023-08-07 DIAGNOSIS — I129 Hypertensive chronic kidney disease with stage 1 through stage 4 chronic kidney disease, or unspecified chronic kidney disease: Secondary | ICD-10-CM | POA: Diagnosis not present

## 2023-08-28 ENCOUNTER — Telehealth: Payer: Self-pay | Admitting: Internal Medicine

## 2023-08-28 NOTE — Telephone Encounter (Signed)
Attempted to schedule follow up, patient is in hospice.

## 2023-08-28 NOTE — Telephone Encounter (Signed)
Noted! Thank you

## 2023-10-14 DIAGNOSIS — I959 Hypotension, unspecified: Secondary | ICD-10-CM | POA: Diagnosis not present

## 2023-10-14 DIAGNOSIS — Z743 Need for continuous supervision: Secondary | ICD-10-CM | POA: Diagnosis not present

## 2024-05-03 ENCOUNTER — Encounter (HOSPITAL_COMMUNITY): Payer: Self-pay | Admitting: Interventional Radiology
